# Patient Record
Sex: Male | Born: 1950
Health system: Southern US, Community
[De-identification: ages and names within clinical notes are randomized; demographics above are authoritative.]

## PROBLEM LIST (undated history)

## (undated) DIAGNOSIS — E785 Hyperlipidemia, unspecified: Secondary | ICD-10-CM

## (undated) DIAGNOSIS — M549 Dorsalgia, unspecified: Secondary | ICD-10-CM

## (undated) DIAGNOSIS — F329 Major depressive disorder, single episode, unspecified: Secondary | ICD-10-CM

## (undated) DIAGNOSIS — G43909 Migraine, unspecified, not intractable, without status migrainosus: Secondary | ICD-10-CM

## (undated) DIAGNOSIS — F32A Depression, unspecified: Secondary | ICD-10-CM

## (undated) DIAGNOSIS — E119 Type 2 diabetes mellitus without complications: Secondary | ICD-10-CM

## (undated) DIAGNOSIS — F419 Anxiety disorder, unspecified: Secondary | ICD-10-CM

## (undated) DIAGNOSIS — M1990 Unspecified osteoarthritis, unspecified site: Secondary | ICD-10-CM

## (undated) DIAGNOSIS — I10 Essential (primary) hypertension: Secondary | ICD-10-CM

## (undated) DIAGNOSIS — G8929 Other chronic pain: Secondary | ICD-10-CM

## (undated) HISTORY — DX: Essential (primary) hypertension: I10

## (undated) HISTORY — PX: BACK SURGERY: SHX140

## (undated) HISTORY — DX: Unspecified osteoarthritis, unspecified site: M19.90

## (undated) HISTORY — DX: Hyperlipidemia, unspecified: E78.5

---

## 1997-12-15 ENCOUNTER — Emergency Department (HOSPITAL_COMMUNITY): Admission: EM | Admit: 1997-12-15 | Discharge: 1997-12-16 | Payer: Self-pay | Admitting: Emergency Medicine

## 1998-07-30 ENCOUNTER — Inpatient Hospital Stay (HOSPITAL_COMMUNITY): Admission: EM | Admit: 1998-07-30 | Discharge: 1998-08-02 | Payer: Self-pay | Admitting: Emergency Medicine

## 1998-07-30 ENCOUNTER — Encounter: Payer: Self-pay | Admitting: Emergency Medicine

## 2007-06-19 DIAGNOSIS — D72829 Elevated white blood cell count, unspecified: Secondary | ICD-10-CM | POA: Insufficient documentation

## 2012-04-10 ENCOUNTER — Encounter: Payer: Self-pay | Admitting: Family Medicine

## 2012-04-10 ENCOUNTER — Ambulatory Visit (INDEPENDENT_AMBULATORY_CARE_PROVIDER_SITE_OTHER): Payer: Medicare Other | Admitting: Family Medicine

## 2012-04-10 VITALS — BP 174/86 | HR 77 | Temp 97.9°F | Resp 18 | Ht 70.0 in | Wt 224.0 lb

## 2012-04-10 DIAGNOSIS — S81802A Unspecified open wound, left lower leg, initial encounter: Secondary | ICD-10-CM

## 2012-04-10 DIAGNOSIS — R42 Dizziness and giddiness: Secondary | ICD-10-CM | POA: Diagnosis not present

## 2012-04-10 DIAGNOSIS — M79609 Pain in unspecified limb: Secondary | ICD-10-CM

## 2012-04-10 DIAGNOSIS — S91009A Unspecified open wound, unspecified ankle, initial encounter: Secondary | ICD-10-CM | POA: Diagnosis not present

## 2012-04-10 DIAGNOSIS — Z23 Encounter for immunization: Secondary | ICD-10-CM

## 2012-04-10 DIAGNOSIS — M79669 Pain in unspecified lower leg: Secondary | ICD-10-CM

## 2012-04-10 DIAGNOSIS — S81009A Unspecified open wound, unspecified knee, initial encounter: Secondary | ICD-10-CM | POA: Diagnosis not present

## 2012-04-10 DIAGNOSIS — S81809A Unspecified open wound, unspecified lower leg, initial encounter: Secondary | ICD-10-CM

## 2012-04-10 LAB — POCT CBC
Granulocyte percent: 42.9 %G (ref 37–80)
HCT, POC: 43 % — AB (ref 43.5–53.7)
Hemoglobin: 13.4 g/dL — AB (ref 14.1–18.1)
Lymph, poc: 4 — AB (ref 0.6–3.4)
MCH, POC: 27.6 pg (ref 27–31.2)
MCHC: 31.2 g/dL — AB (ref 31.8–35.4)
MCV: 88.7 fL (ref 80–97)
MID (cbc): 0.7 (ref 0–0.9)
MPV: 12.2 fL (ref 0–99.8)
POC Granulocyte: 5.3 (ref 2–6.9)
POC LYMPH PERCENT: 40 %L (ref 10–50)
POC MID %: 7.1 %M (ref 0–12)
Platelet Count, POC: 198 10*3/uL (ref 142–424)
RBC: 4.85 M/uL (ref 4.69–6.13)
RDW, POC: 14.2 %
WBC: 10.1 10*3/uL (ref 4.6–10.2)

## 2012-04-10 MED ORDER — HYDROCODONE-ACETAMINOPHEN 5-325 MG PO TABS: 1.0000 | ORAL_TABLET | Freq: Four times a day (QID) | ORAL | Status: DC | PRN

## 2012-04-10 MED ORDER — DOXYCYCLINE HYCLATE 100 MG PO TABS: 100.0000 mg | ORAL_TABLET | Freq: Two times a day (BID) | ORAL | Status: DC

## 2012-04-10 NOTE — Patient Instructions (Addendum)
Take the antibiotic twice per day.  Keep covered with bandage and dry. Keep leg elevated above your heart when seated, and use crutches to walk. If any pain or numbness into foot, or other worsening - go to the emergency room.  If needed - can take Lortab up to every 6 hours.  We will refer you to the orthopaedic surgeon on Monday.. You should be receiving a call about this.   Return to the clinic or go to the nearest emergency room if any of your symptoms worsen or new symptoms occur.

## 2012-04-10 NOTE — Progress Notes (Signed)
  Subjective:    Patient ID: Jeremy Medina, male    DOB: 1950/11/04, 61 y.o.   MRN: [id number]  HPI Jeremy Medina is a 61 y.o. male DOI - 1 hour ago. Cleaning sword collection - tip of stainless steel blade 3-4 inches in to leg.  Large puddle of blood afterwards.  Pressure to area eventually stopped bleeding.   Feels a little woozy. Normal sensation into toes, and warmth.  calf sore beyond and around wound.   Hx of htn, hyperlipidemia, back pain.  No known allergies. Last td in 2003.    Review of Systems  Skin:       L calf.  Neurological: Positive for dizziness and light-headedness.       Objective:   Physical Exam  Constitutional: He is oriented to person, place, and time. He appears well-developed and well-nourished.       Slightly lightheaded with sitting up.   HENT:  Head: Normocephalic and atraumatic.  Eyes: EOM are normal. Pupils are equal, round, and reactive to light.  Cardiovascular: Normal rate, regular rhythm, normal heart sounds and intact distal pulses.   Pulmonary/Chest: Effort normal and breath sounds normal.  Musculoskeletal:       Legs: Neurological: He is alert and oriented to person, place, and time.  Skin: He is not diaphoretic.  Psychiatric: He has a normal mood and affect. His behavior is normal.   Results for orders placed in visit on 04/10/12  POCT CBC      Component Value Range   WBC 10.1  4.6 - 10.2 K/uL   Lymph, poc 4.0 (*) 0.6 - 3.4   POC LYMPH PERCENT 40.0  10 - 50 %L   MID (cbc) 0.7  0 - 0.9   POC MID % 7.1  0 - 12 %M   POC Granulocyte 5.3  2 - 6.9   Granulocyte percent 42.9  37 - 80 %G   RBC 4.85  4.69 - 6.13 M/uL   Hemoglobin 13.4 (*) 14.1 - 18.1 g/dL   HCT, POC 40.9 (*) 81.1 - 53.7 %   MCV 88.7  80 - 97 fL   MCH, POC 27.6  27 - 31.2 pg   MCHC 31.2 (*) 31.8 - 35.4 g/dL   RDW, POC 91.4     Platelet Count, POC 198  142 - 424 K/uL   MPV 12.2  0 - 99.8 fL       Assessment & Plan:  Jeremy Medina is a 61 y.o. male 1. Leg  wound, left  Tdap vaccine greater than or equal to 7yo IM, POCT CBC    Wound L calf - with possible extension to medial calf gastrocnemius with plantarflexion pain and  Tdap given. CBC as above due to dizziness -overall reassuring.  Called orthopaedic surgeon on call to discuss. Discussed plan - recommended flushing wound - no surgery at this point, loose closure to lessen chance of infection (see procedure note), with sterile bandage to cover crutches, with nwb, elevate above heart when seated. doxycycline 100mg  BID #20,  Lortab 5/500 Q 6h prn.   Follow up with Dr.Duda in 4 days.  rtc er precautions discussed, including any signs or symptoms of compartment syndrome, or any increased dizziness.  Understanding expressed.

## 2012-04-10 NOTE — Progress Notes (Signed)
Patient ID: KINNEY SACKMANN MRN: [id number], DOB: Nov 19, 1950, 61 y.o. Date of Encounter: 04/10/2012, 3:59 PM   PROCEDURE NOTE: Verbal consent obtained. Sterile technique employed. Numbing: Anesthesia obtained with 1% lidocaine with epinephrine.   Cleansed with soap and water. Irrigated.  Wound explored, no deep structures involved, no foreign bodies.  Superficial laceration to gastrocnemius.  Wound repaired with # 5 SI using 4-0 ethilon loosely approximated.  Hemostasis obtained. Wound cleansed and dressed.  Wound care instructions including precautions covered with patient. Handout given.  Anticipate suture removal in 7-10.  Rhoderick Moody, PA-C 04/10/2012 3:59 PM

## 2012-04-18 ENCOUNTER — Ambulatory Visit (INDEPENDENT_AMBULATORY_CARE_PROVIDER_SITE_OTHER): Payer: Medicare Other | Admitting: Family Medicine

## 2012-04-18 VITALS — BP 128/78 | HR 56 | Temp 98.4°F | Resp 20 | Ht 69.0 in | Wt 220.0 lb

## 2012-04-18 DIAGNOSIS — M79669 Pain in unspecified lower leg: Secondary | ICD-10-CM

## 2012-04-18 DIAGNOSIS — M79609 Pain in unspecified limb: Secondary | ICD-10-CM

## 2012-04-18 DIAGNOSIS — S81802A Unspecified open wound, left lower leg, initial encounter: Secondary | ICD-10-CM

## 2012-04-18 MED ORDER — DOXYCYCLINE HYCLATE 100 MG PO TABS: 100.0000 mg | ORAL_TABLET | Freq: Two times a day (BID) | ORAL | Status: DC

## 2012-04-18 MED ORDER — HYDROCODONE-ACETAMINOPHEN 5-325 MG PO TABS: 1.0000 | ORAL_TABLET | Freq: Four times a day (QID) | ORAL | Status: DC | PRN

## 2012-04-18 NOTE — Progress Notes (Signed)
[facility] [address], [name] [address] [phone]  Date:  04/18/2012   Name:  Jeremy Medina   DOB:  22-Mar-1951   MRN:  [id number]  PCP:  No primary provider on file.    Chief Complaint: Suture / Staple Removal, Leg Pain and Fatigue   History of Present Illness:  Jeremy Medina is a 61 y.o. very pleasant male patient who presents with the following:  He was here on 04/10/12 after he cut his left leg in the medial calf deeply while cleaning his sword collection. He was give a Tdap booster and the wound was closed with loose stitches, given doxy and lortab.  He was to follow- up with Dr. Lajoyce Corners at [facility] ortho a few days after his visit. However, he states that he never received a call regarding this appointment.  At this point he feels that he is doing better and he does not feel that he needs to see ortho.    He still has pain at the site of the wound, but he does not have any numbness. He does not feel that he has any deficit of movement or leg strength.  He has a history of back problems and has some peripheral neuropathy at baseline- this does not seem to have changed  There is no problem list on file for this patient.   Past Medical History  Diagnosis Date  . Arthritis   . Hypertension   . Hyperlipidemia     Past Surgical History  Procedure Date  . Back surgery     History  Substance Use Topics  . Smoking status: Current Every Day Smoker -- 0.5 packs/day    Types: Cigarettes  . Smokeless tobacco: Not on file  . Alcohol Use: No    Family History  Problem Relation Age of Onset  . Diabetes Mother   . Dementia Mother   . Diabetes Father   . Hypertension Father   . Dementia Father   . Hyperlipidemia Father     No Known Allergies  Medication list has been reviewed and updated.  Current Outpatient Prescriptions on File Prior to Visit  Medication Sig Dispense Refill  . doxycycline (VIBRA-TABS) 100 MG tablet Take 1 tablet (100  mg total) by mouth 2 (two) times daily.  20 tablet  0  . HYDROcodone-acetaminophen (NORCO/VICODIN) 5-325 MG per tablet Take 1 tablet by mouth every 6 (six) hours as needed for pain.  20 tablet  0    Review of Systems:  As per HPI- otherwise negative.   Physical Examination: Filed Vitals:   04/18/12 0924  BP: 128/78  Pulse: 56  Temp: 98.4 F (36.9 C)  Resp: 20   Filed Vitals:   04/18/12 0924  Height: 5\' 9"  (1.753 m)  Weight: 220 lb (99.791 kg)   Body mass index is 32.49 kg/(m^2). Ideal Body Weight: Weight in (lb) to have BMI = 25: 168.9    GEN: WDWN, NAD, Non-toxic, Alert & Oriented x 3 HEENT: Atraumatic, Normocephalic.  Ears and Nose: No external deformity. EXTR: No clubbing/cyanosis/edema NEURO: Normal gait.  PSYCH: Normally interactive. Conversant. Not depressed or anxious appearing.  Calm demeanor.  Left leg: on the medial mid- calf there is a healing wound that is sutured. Mild redness around the wound but no purulent discharge. He has tenderness in the leg inferior to the wound- likely due to blood in tissues which is irritating.  The calves measure equally, the calf muscle is soft  without any cords.  He has good extension of the ankle, his flexion is a little weak but this is baseline per his report.    Removed sutures- wound is healing well and closed.  Placed steri- strips for support.    Assessment and Plan: 1. Leg wound, left  doxycycline (VIBRA-TABS) 100 MG tablet, HYDROcodone-acetaminophen (NORCO/VICODIN) 5-325 MG per tablet  2. Pain, lower leg  HYDROcodone-acetaminophen (NORCO/VICODIN) 5-325 MG per tablet   Will continue doxycycline treatment for another week, and gave a refill of vicodin to use as needed for pain.  Follow- up in 7- 10 days as needed.  At this point he declined to see ortho as he is doing better.  Continue to elevate the leg and take it easy.    Abbe Amsterdam, MD

## 2012-04-18 NOTE — Patient Instructions (Addendum)
Wash the wound gently with soap and water.  Continue to watch out for worsening of your pain.  If you are continuing to steadily get better that is great- if not let us know!

## 2013-05-28 ENCOUNTER — Ambulatory Visit (INDEPENDENT_AMBULATORY_CARE_PROVIDER_SITE_OTHER): Payer: Medicare Other | Admitting: Emergency Medicine

## 2013-05-28 VITALS — BP 138/82 | HR 81 | Temp 98.1°F | Resp 16 | Ht 68.25 in | Wt 211.0 lb

## 2013-05-28 DIAGNOSIS — F4321 Adjustment disorder with depressed mood: Secondary | ICD-10-CM

## 2013-05-28 DIAGNOSIS — F329 Major depressive disorder, single episode, unspecified: Secondary | ICD-10-CM | POA: Diagnosis not present

## 2013-05-28 MED ORDER — PAROXETINE HCL 20 MG PO TABS: 20.0000 mg | ORAL_TABLET | Freq: Every day | ORAL | Status: DC

## 2013-05-28 MED ORDER — LORAZEPAM 1 MG PO TABS: 1.0000 mg | ORAL_TABLET | Freq: Three times a day (TID) | ORAL | Status: DC | PRN

## 2013-05-28 NOTE — Progress Notes (Signed)
[facility] [address], [name] [address] [phone]  Date:  05/28/2013   Name:  Jeremy Medina   DOB:  [birth year]   MRN:  [id number]  PCP:  No primary provider on file.    Chief Complaint: trouble sleeping and Anxiety   History of Present Illness:  Jeremy Medina is a 62 y.o. very pleasant male patient who presents with the following:  Disabled due to back injury in [employer]	[place].  His father passed on 2023-05-18 at age 54.  Since his mother who has a rather severe dementia calls him many times a day wondering where his father is.  She has not grasped that he has passed.  As a conseqence, he is no sleeping more than a couple hours nightly and is very stressed.  He has no thoughts of harm to self others.  No hallucinations.  Poor appetite.  12 pounds weight loss over the month.  No improvement with over the counter medications or other home remedies. Denies other complaint or health concern today.   There are no active problems to display for this patient.   Past Medical History  Diagnosis Date  . Arthritis   . Hypertension   . Hyperlipidemia     Past Surgical History  Procedure Laterality Date  . Back surgery      History  Substance Use Topics  . Smoking status: Current Every Day Smoker -- 0.50 packs/day    Types: Cigarettes  . Smokeless tobacco: Not on file  . Alcohol Use: No    Family History  Problem Relation Age of Onset  . Diabetes Mother   . Dementia Mother   . Diabetes Father   . Hypertension Father   . Dementia Father   . Hyperlipidemia Father     No Known Allergies  Medication list has been reviewed and updated.  Current Outpatient Prescriptions on File Prior to Visit  Medication Sig Dispense Refill  . doxycycline (VIBRA-TABS) 100 MG tablet Take 1 tablet (100 mg total) by mouth 2 (two) times daily.  14 tablet  0  . HYDROcodone-acetaminophen (NORCO/VICODIN) 5-325 MG per tablet Take 1 tablet by mouth every 6 (six) hours as  needed for pain.  20 tablet  0   No current facility-administered medications on file prior to visit.    Review of Systems:  As per HPI, otherwise negative.    Physical Examination: Filed Vitals:   05/28/13 0925  BP: 138/82  Pulse: 81  Temp: 98.1 F (36.7 C)  Resp: 16   Filed Vitals:   05/28/13 0925  Height: 5' 8.25" (1.734 m)  Weight: 211 lb (95.709 kg)   Body mass index is 31.83 kg/(m^2). Ideal Body Weight: Weight in (lb) to have BMI = 25: 165.3  GEN: WDWN, moderate distress, Non-toxic, A & O x 3 HEENT: Atraumatic, Normocephalic. Neck supple. No masses, No LAD. Ears and Nose: No external deformity. CV: RRR, No M/G/R. No JVD. No thrill. No extra heart sounds. PULM: CTA B, no wheezes, crackles, rhonchi. No retractions. No resp. distress. No accessory muscle use. ABD: S, NT, ND, +BS. No rebound. No HSM. EXTR: No c/c/e NEURO Normal gait.  PSYCH: Normally interactive. Conversant. Not  anxious appearing.  Calm demeanor. Has a rather flat affect.   Assessment and Plan: Grieving Situational depression   Signed,  Phillips Odor, MD

## 2013-05-28 NOTE — Patient Instructions (Signed)
Depression, Adult °Depression refers to feeling sad, low, down in the dumps, blue, gloomy, or empty. In general, there are two kinds of depression: °1. Depression that we all experience from time to time because of upsetting life experiences, including the loss of a job or the ending of a relationship (normal sadness or normal grief). This kind of depression is considered normal, is short lived, and resolves within a few days to 2 weeks. (Depression experienced after the loss of a loved one is called bereavement. Bereavement often lasts longer than 2 weeks but normally gets better with time.) °2. Clinical depression, which lasts longer than normal sadness or normal grief or interferes with your ability to function at home, at work, and in school. It also interferes with your personal relationships. It affects almost every aspect of your life. Clinical depression is an illness. °Symptoms of depression also can be caused by conditions other than normal sadness and grief or clinical depression. Examples of these conditions are listed as follows: °· Physical illness Some physical illnesses, including underactive thyroid gland (hypothyroidism), severe anemia, specific types of cancer, diabetes, uncontrolled seizures, heart and lung problems, strokes, and chronic pain are commonly associated with symptoms of depression. °· Side effects of some prescription medicine In some people, certain types of prescription medicine can cause symptoms of depression. °· Substance abuse Abuse of alcohol and illicit drugs can cause symptoms of depression. °SYMPTOMS °Symptoms of normal sadness and normal grief include the following: °· Feeling sad or crying for short periods of time. °· Not caring about anything (apathy). °· Difficulty sleeping or sleeping too much. °· No longer able to enjoy the things you used to enjoy. °· Desire to be by oneself all the time (social isolation). °· Lack of energy or motivation. °· Difficulty  concentrating or remembering. °· Change in appetite or weight. °· Restlessness or agitation. °Symptoms of clinical depression include the same symptoms of normal sadness or normal grief and also the following symptoms: °· Feeling sad or crying all the time. °· Feelings of guilt or worthlessness. °· Feelings of hopelessness or helplessness. °· Thoughts of suicide or the desire to harm yourself (suicidal ideation). °· Loss of touch with reality (psychotic symptoms). Seeing or hearing things that are not real (hallucinations) or having false beliefs about your life or the people around you (delusions and paranoia). °DIAGNOSIS  °The diagnosis of clinical depression usually is based on the severity and duration of the symptoms. Your caregiver also will ask you questions about your medical history and substance use to find out if physical illness, use of prescription medicine, or substance abuse is causing your depression. Your caregiver also may order blood tests. °TREATMENT  °Typically, normal sadness and normal grief do not require treatment. However, sometimes antidepressant medicine is prescribed for bereavement to ease the depressive symptoms until they resolve. °The treatment for clinical depression depends on the severity of your symptoms but typically includes antidepressant medicine, counseling with a mental health professional, or a combination of both. Your caregiver will help to determine what treatment is best for you. °Depression caused by physical illness usually goes away with appropriate medical treatment of the illness. If prescription medicine is causing depression, talk with your caregiver about stopping the medicine, decreasing the dose, or substituting another medicine. °Depression caused by abuse of alcohol or illicit drugs abuse goes away with abstinence from these substances. Some adults need professional help in order to stop drinking or using drugs. °SEEK IMMEDIATE CARE IF: °· You have   thoughts  about hurting yourself or others. °· You lose touch with reality (have psychotic symptoms). °· You are taking medicine for depression and have a serious side effect. °FOR MORE INFORMATION °National Alliance on Mental Illness: www.nami.org  °National Institute of Mental Health: www.nimh.nih.gov  °Document Released: 06/01/2000 Document Revised: 12/04/2011 Document Reviewed: 09/03/2011 °ExitCare® Patient Information ©2014 [employer]. ° °Grief Reaction °Grief is a normal response to the death of someone close to you. Feelings of fear, anger, and guilt can affect almost everyone who loses someone they love. Symptoms of depression are also common. These include problems with sleep, loss of appetite, and lack of energy. These grief reaction symptoms often last for weeks to months after a loss. They may also return during special times that remind you of the person you lost, such as an anniversary or birthday. °Anxiety, insomnia, irritability, and deep depression may last beyond the period of normal grief. If you experience these feelings for 6 months or longer, you may have clinical depression. Clinical depression requires further medical attention. If you think that you have clinical depression, you should contact your caregiver. If you have a history of depression and or a family history of depression, you are at greater risk of clinical depression. You are also at greater risk of developing clinical depression if the loss was traumatic or the loss was of someone with whom you had unresolved issues.  °A grief reaction can become complicated by being blocked. This means being unable to cry or express extreme emotions. This may prolong the grieving period and worsen the emotional effects of the loss. Mourning is a natural event in human life. A healthy grief reaction is one that is not blocked . It requires a time of sadness and readjustment.It is very important to share your sorrow and fear with others, especially  close friends and family. Professional counselors and clergy can also help you process your grief. °Document Released: 06/04/2005 Document Revised: 08/27/2011 Document Reviewed: 02/12/2006 °ExitCare® Patient Information ©2014 [employer]. ° °

## 2013-06-25 ENCOUNTER — Other Ambulatory Visit: Payer: Self-pay | Admitting: Emergency Medicine

## 2013-06-25 ENCOUNTER — Telehealth: Payer: Self-pay

## 2013-06-25 MED ORDER — LORAZEPAM 1 MG PO TABS: 1.0000 mg | ORAL_TABLET | Freq: Three times a day (TID) | ORAL | Status: DC | PRN

## 2013-06-25 NOTE — Telephone Encounter (Signed)
[name] - Pt  Wants to know if you will refill the ativan.  He says the other medicines make him have blurry vision.  Says that the ativan keeps him calm and helps him in getting over his parents' deaths.  He thinks one more refill will be all he needs. [phone]

## 2013-06-25 NOTE — Telephone Encounter (Signed)
Pt aware.

## 2013-06-25 NOTE — Telephone Encounter (Signed)
Pt seen on 05/28/13. Do you authorize refill for pt?

## 2013-06-25 NOTE — Telephone Encounter (Signed)
sent 

## 2013-07-27 ENCOUNTER — Telehealth: Payer: Self-pay

## 2013-07-27 NOTE — Telephone Encounter (Signed)
Pt left a message on Saturday, 07/25/13, in the billing office voicemail, requesting a refill for ativan Please call pt to advise [phone]

## 2013-07-29 ENCOUNTER — Telehealth: Payer: Self-pay | Admitting: Emergency Medicine

## 2013-07-29 NOTE — Telephone Encounter (Signed)
Patient called requesting a refill on  LORazepam (ATIVAN) 1 MG tablet Please advise

## 2013-07-29 NOTE — Telephone Encounter (Signed)
Must come in to be seen for refill

## 2013-07-30 NOTE — Telephone Encounter (Signed)
Duplicate, see message under 07/27/13 message.

## 2013-07-30 NOTE — Telephone Encounter (Signed)
LMOM that Dr Dareen PianoAnderson will need to see pt back before he can RF Ativan.

## 2014-08-05 ENCOUNTER — Encounter (HOSPITAL_COMMUNITY): Payer: Self-pay

## 2014-08-05 ENCOUNTER — Emergency Department (HOSPITAL_COMMUNITY)
Admission: EM | Admit: 2014-08-05 | Discharge: 2014-08-05 | Disposition: A | Discharge status: Home / Self Care | Payer: Non-veteran care | Attending: Emergency Medicine | Admitting: Emergency Medicine

## 2014-08-05 DIAGNOSIS — Z720 Tobacco use: Secondary | ICD-10-CM | POA: Insufficient documentation

## 2014-08-05 DIAGNOSIS — G8929 Other chronic pain: Secondary | ICD-10-CM | POA: Diagnosis not present

## 2014-08-05 DIAGNOSIS — I10 Essential (primary) hypertension: Secondary | ICD-10-CM | POA: Insufficient documentation

## 2014-08-05 DIAGNOSIS — Z79899 Other long term (current) drug therapy: Secondary | ICD-10-CM | POA: Insufficient documentation

## 2014-08-05 DIAGNOSIS — L723 Sebaceous cyst: Secondary | ICD-10-CM | POA: Insufficient documentation

## 2014-08-05 DIAGNOSIS — E785 Hyperlipidemia, unspecified: Secondary | ICD-10-CM | POA: Insufficient documentation

## 2014-08-05 DIAGNOSIS — M1990 Unspecified osteoarthritis, unspecified site: Secondary | ICD-10-CM | POA: Insufficient documentation

## 2014-08-05 DIAGNOSIS — R51 Headache: Secondary | ICD-10-CM | POA: Diagnosis not present

## 2014-08-05 NOTE — ED Provider Notes (Signed)
Patient with neck pain and headaches for greater than 6 months., Unchanged. He presents today as he noticed "a lump" at left paraspinal area yesterday, however he states that lump may have been present there for a longer period of time. On exam patient is alert Glasgow Coma Score 15 no distress moves all extremities well. HCT exam no facial asymmetry neck no point tenderness. There is a 2 cm nodule to the left of midline overlying trapezius which is tender. No redness no warmth no fluctuance. Patient's pain is chronic . He requests new primary care physician, we will supply referrals  Doug SouSam Lil Lepage, MD 08/05/14 1626

## 2014-08-05 NOTE — ED Notes (Addendum)
Patient complains of throbbing neck and head pain for 6-7 months.  Patient also states "behind my eyes hurt."  Patient c/o blurred vision.  Patient states he has also had left arm numbness/weakness for the past 6-7 months.  Patient, who has a history of chronic low back pain, states he has been seen at the [facility] in [place] on many occasions for this pain.  Patient states no imaging has been done.  Patient states today he discovered a "lump," just distal to his posterior cervical spine.  Patient denies numbness/tingling in his right leg, but states he has chronic weakness, numbness and tingling in LLE.  Patient denies loss of bowel or bladder control.  On exam, lung sounds are clear bilaterally in all lobes.  Heart sounds are WNL, S1/S2 with no murmur, rate regular.  +2 radial and pedal pulses palpated.  Bowel sounds present and abdomen is soft and tender with no palpation.  CN III intact, 3mm, no astigmatism noted.  EOM intact.  Patient's speech is clear with no slurring or forced speech.  Grips R>L.  A formed mass of approximately 2 cm in diameter is palpable to left of base of neck.

## 2014-08-05 NOTE — Discharge Instructions (Signed)
The lump on your neck is most likely some type of cyst. It does not look like it can be drained right now, and likely would need a surgeon to remove it. Use warm compresses/hot showers and tylenol to help with the pain. Establish with a new primary doctor in [place] for any further evaluation/referral for this.  Tylenol: can take 500mg  every 6 hours as needed  You should have your blood pressure re-checked within the next 3 weeks by your doctor.

## 2014-08-05 NOTE — ED Notes (Signed)
Pt presents with c/o headache for a couple of months. Pt also reports he feels a lump on his neck on the left side and his having pain on that side. Pt reports he has been unable to move his neck today.

## 2014-08-05 NOTE — ED Provider Notes (Signed)
CSN: [id number]     Arrival date & time 08/05/14  1342 History   First MD Initiated Contact with Patient 08/05/14 1442     Chief Complaint  Patient presents with  . Headache  . Lump on neck   . cervical pain      (Consider location/radiation/quality/duration/timing/severity/associated sxs/prior Treatment) HPI Comments: Pt is a 64 y.o. male presenting to the ED with a lump on back of his neck and 8-12 month history of headaches, neck/arm/back pain. PMH significant for distant lumbar/sacral compression fractures while in the [employer] during [place] war, hypertension, hyperlipidemia. Pt states he has a long history of chronic headaches that occur in front left and wrap around head both sides, pain throughout his back and neck as well as left shoulder/arm pain. Says he came to the ED because he felt he needed an "answer" about the causes of his issues and because of the newly noted lump on the back of his neck that he feels is the cause of his pain and concerned it is on his spine. He has not taken anything for the pain other than his methadone that he has been on chronically for his low back injury/pain. He denies any new numbness or tingling of his LE (has some chronically), no bowel or bladder incontinence, no fevers or chills.  Patient is a 64 y.o. male presenting with headaches. The history is provided by the patient. No language interpreter was used.  Headache Associated symptoms: fatigue and numbness   Associated symptoms: no abdominal pain, no diarrhea, no dizziness, no fever, no nausea and no vomiting     Past Medical History  Diagnosis Date  . Arthritis   . Hypertension   . Hyperlipidemia    Past Surgical History  Procedure Laterality Date  . Back surgery     Family History  Problem Relation Age of Onset  . Diabetes Mother   . Dementia Mother   . Diabetes Father   . Hypertension Father   . Dementia Father   . Hyperlipidemia Father    History  Substance Use Topics  .  Smoking status: Current Every Day Smoker -- 0.50 packs/day    Types: Cigarettes  . Smokeless tobacco: Not on file  . Alcohol Use: No    Review of Systems  Constitutional: Positive for fatigue. Negative for fever and chills.  Cardiovascular: Negative for chest pain, palpitations and leg swelling.  Gastrointestinal: Negative for nausea, vomiting, abdominal pain and diarrhea.  Genitourinary: Negative for dysuria.  Neurological: Positive for numbness and headaches. Negative for dizziness and light-headedness.  Psychiatric/Behavioral: Positive for behavioral problems.  All other systems reviewed and are negative.     Allergies  Review of patient's allergies indicates no known allergies.  Home Medications   Prior to Admission medications   Medication Sig Start Date End Date Taking? Authorizing Provider  Atorvastatin Calcium (LIPITOR PO) Take 1 tablet by mouth daily.   Yes Historical Provider, MD  methadone (DOLOPHINE) 10 MG tablet Take 10 mg by mouth every 8 (eight) hours as needed for moderate pain.   Yes Historical Provider, MD  VITAMIN D, ERGOCALCIFEROL, PO Take 4 tablets by mouth daily.   Yes Historical Provider, MD  doxycycline (VIBRA-TABS) 100 MG tablet Take 1 tablet (100 mg total) by mouth 2 (two) times daily. Patient not taking: Reported on 08/05/2014 04/18/12   Pearline Cables, MD  HYDROcodone-acetaminophen (NORCO/VICODIN) 5-325 MG per tablet Take 1 tablet by mouth every 6 (six) hours as needed for pain. Patient not  taking: Reported on 08/05/2014 04/18/12   Gwenlyn FoundJessica C Copland, MD  LORazepam (ATIVAN) 1 MG tablet Take 1 tablet (1 mg total) by mouth every 8 (eight) hours as needed for anxiety. Patient not taking: Reported on 08/05/2014 06/25/13   Carmelina DaneJeffery S [name], MD  PARoxetine (PAXIL) 20 MG tablet Take 1 tablet (20 mg total) by mouth daily. Patient not taking: Reported on 08/05/2014 05/28/13   Carmelina DaneJeffery S [name], MD   BP 152/92 mmHg  Pulse 63  Temp(Src) 98.2 F (36.8 C) (Oral)   Resp 20  SpO2 97% Physical Exam  Constitutional: He is oriented to person, place, and time. He appears well-developed and well-nourished.  HENT:  Head: Normocephalic and atraumatic.  Neck: Muscular tenderness present. No rigidity.    Cardiovascular: Normal rate, regular rhythm, normal heart sounds and intact distal pulses.   Musculoskeletal:       Right shoulder: He exhibits decreased range of motion (abduction to 90* passively but pt still complains of pain).  Neurological: He is alert and oriented to person, place, and time. No cranial nerve deficit.  Skin: Skin is warm and dry.  Psychiatric: He is withdrawn. Cognition and memory are not impaired. He exhibits a depressed mood. He expresses no homicidal and no suicidal ideation. He expresses no suicidal plans and no homicidal plans.  Nursing note and vitals reviewed.  Bedside ultrasound exam performed by Dr. Ethelda ChickJacubowitz: no definite cystic fluid in the mass that could be drained.  ED Course  Procedures (including critical care time) Labs Review Labs Reviewed - No data to display  Imaging Review No results found.   EKG Interpretation None      MDM   Final diagnoses:  Sebaceous cyst  Chronic pain   Initial BP elevated, repeat better controlled. Discussed extensively with patient that the mass he is feeling is most likely benign/cyst in nature but would probably need surgical referral for removal. Encouraged to establish with PCP in [place] has [facility]. His pain may be worsened by his chronic methadone use causing hyperthesias. Encouraged to use warm compresses, tylenol to help with pain. Stable for discharge.     Nani RavensAndrew M Lashawna Poche, MD 08/05/14 1617  Nani RavensAndrew M Lexie Morini, MD 08/05/14 1624  Doug SouSam Jacubowitz, MD 08/05/14 [id number]

## 2014-11-24 ENCOUNTER — Ambulatory Visit: Payer: Medicare Other

## 2014-11-24 ENCOUNTER — Ambulatory Visit (INDEPENDENT_AMBULATORY_CARE_PROVIDER_SITE_OTHER): Payer: Medicare Other

## 2014-11-24 ENCOUNTER — Ambulatory Visit (INDEPENDENT_AMBULATORY_CARE_PROVIDER_SITE_OTHER): Payer: Medicare Other | Admitting: Physician Assistant

## 2014-11-24 VITALS — BP 116/72 | HR 75 | Temp 98.6°F | Resp 16 | Ht 71.0 in

## 2014-11-24 DIAGNOSIS — Z23 Encounter for immunization: Secondary | ICD-10-CM | POA: Diagnosis not present

## 2014-11-24 DIAGNOSIS — F329 Major depressive disorder, single episode, unspecified: Secondary | ICD-10-CM

## 2014-11-24 DIAGNOSIS — M545 Low back pain, unspecified: Secondary | ICD-10-CM

## 2014-11-24 DIAGNOSIS — G894 Chronic pain syndrome: Secondary | ICD-10-CM

## 2014-11-24 DIAGNOSIS — S71112A Laceration without foreign body, left thigh, initial encounter: Secondary | ICD-10-CM | POA: Diagnosis not present

## 2014-11-24 DIAGNOSIS — F32A Depression, unspecified: Secondary | ICD-10-CM

## 2014-11-24 MED ORDER — CEPHALEXIN 500 MG PO CAPS: 500.0000 mg | ORAL_CAPSULE | Freq: Two times a day (BID) | ORAL | Status: DC

## 2014-11-24 NOTE — Patient Instructions (Signed)
[email or web address] Go to above website to read up on chronic pain support groups in the area. Return in 48 hours for follow up. Leave packing in place until then. Take antibiotic twice a day for 10 days. Change dressing daily. [facility] and [facility] will [facility] you a [facility] clinic.

## 2014-11-24 NOTE — Progress Notes (Signed)
[facility] [address], [name] [address] [phone]  Date:  11/24/2014   Name:  Jeremy Medina   DOB:  [birth year]   MRN:  [id number]  PCP:  No primary care provider on file.    Chief Complaint: Laceration; Back Pain; Headache; and Depression   History of Present Illness:  This is a 64 y.o. male who is presenting with laceration to left thigh and back pain after a fall last night. Occurred at least 15 hours ago. States he was arranging things in his bedroom and tripped over a model airplane that was on the floor. He fell and cut his left thigh on the motor of a model airplane. He put an unknown first aid ointment inside the wound. Patient has 40 year history of DDD and has had unknown lumbar surgery 40 years ago. He injured his back in the [employer]. He is cared for by Dr. Dola Factor at the [facility] in [place], [name]. He receives methadone monthly in the mail from the [place]. he also takes Ativan daily for neck pain and headaches. Patient is stating that back pain has been aggravated since his fall yesterday. He is having pain on the right side that is shooting down his leg. Patient reports he has chronic weakness on his left side. He states he walks hunched at baseline. Patient is wanting more Ativan for his headaches.   Patient does admit to depression but states he does not want to be on anything. He has been on SSRIs and SSRIs in the past and he has had side effects. He states he does not have a support system here except for his son who he lives with. He does not have any SI/HI.   Review of Systems:  Review of Systems  Constitutional: Negative for fever and chills.  Respiratory: Negative for shortness of breath.   Cardiovascular: Negative for chest pain.  Gastrointestinal: Negative for nausea and vomiting.  Genitourinary: Negative for dysuria.  Musculoskeletal: Positive for back pain and neck pain.  Skin: Positive for color change and wound.  Neurological: Positive for  weakness and headaches. Negative for numbness.  Psychiatric/Behavioral: Positive for dysphoric mood. Negative for suicidal ideas and self-injury.    There are no active problems to display for this patient.   Prior to Admission medications   Medication Sig Start Date End Date Taking? Authorizing Provider  LORazepam (ATIVAN) 1 MG tablet Take 1 tablet (1 mg total) by mouth every 8 (eight) hours as needed for anxiety. 06/25/13  Yes Carmelina Dane, MD  methadone (DOLOPHINE) 10 MG tablet Take 10 mg by mouth every 8 (eight) hours as needed for moderate pain.   Yes Historical Provider, MD    No Known Allergies  Past Surgical History  Procedure Laterality Date  . Back surgery      History  Substance Use Topics  . Smoking status: Current Every Day Smoker -- 0.50 packs/day    Types: Cigarettes  . Smokeless tobacco: Not on file  . Alcohol Use: No    Family History  Problem Relation Age of Onset  . Diabetes Mother   . Dementia Mother   . Diabetes Father   . Hypertension Father   . Dementia Father   . Hyperlipidemia Father     Medication list has been reviewed and updated.  Physical Examination:  Physical Exam  Constitutional: He is oriented to person, place, and time. He appears well-developed and well-nourished. No distress.  HENT:  Head: Normocephalic  and atraumatic.  Right Ear: Hearing normal.  Left Ear: Hearing normal.  Nose: Nose normal.  Mouth/Throat: Uvula is midline, oropharynx is clear and moist and mucous membranes are normal.  Eyes: Conjunctivae and lids are normal. Right eye exhibits no discharge. Left eye exhibits no discharge. No scleral icterus.  Cardiovascular: Normal rate, regular rhythm, normal heart sounds and normal pulses.   No murmur heard. Pulmonary/Chest: Effort normal and breath sounds normal. No respiratory distress. He has no wheezes. He has no rhonchi. He has no rales.  Abdominal: Soft. Normal appearance. There is no tenderness.   Musculoskeletal: Normal range of motion.       Lumbar back: He exhibits tenderness (right paraspinal and lateral right hip). He exhibits no bony tenderness, no swelling and no laceration.  Pt hunches when walking Gait antalgic. Full strength 5/5 in right leg. Strength 4/5 in left leg (chronic) Sensation intact. Pulses intact. Cap refill intact.  Neurological: He is alert and oriented to person, place, and time. Gait abnormal.  Skin: Skin is warm and dry.  4 cm laceration on left thigh. Laceration is deep to level of muscle, muscle not lacerated. Laceration with white debris inside. No evidence of infection. Wound edges scabbed and starting to heal.  Psychiatric: He has a normal mood and affect. His speech is normal and behavior is normal. Thought content normal.   BP 116/72 mmHg  Pulse 75  Temp(Src) 98.6 F (37 C) (Oral)  Resp 16  Ht 5\' 11"  (1.803 m)  SpO2 98%   [facility] reading (PRIMARY) by  Dr. Cleta Albertsaub:  Lumbar: Multilevel degenerative disc disease. Calcification in plain film, assumed to be in right kidney. Hip: negative  Wound irrigated profusely with soap and water. White ointment removed from wound as much as possible. Wound packed with xeroform gauze. Dressing placed.  Assessment and Plan:  1. Right-sided low back pain without sciatica 2. Depression 3. Chronic pain syndrome Radiographs with no acute abnormalities. Pt has a hunched gait that is normal for him and weakness in his left leg that is normal for him. [place] [facility]. He is asking for ativan. I explained to pt it would be unwise for me to get involved in his care. [facility]. Pt may benefit from seeing a pain clinic. [place] [facility] to [facility] [place] there. I have given him information on chronic pain support groups in the area as pt has dysphoric mood and feels he has no support group. No SI/HI. He does not want to be on medication for his mood. - DG  Lumbar Spine Complete; Future - DG HIP UNILAT WITH PELVIS 2-3 VIEWS RIGHT; Future  4. Need for Tdap vaccination - Tdap vaccine greater than or equal to 7yo IM  5. Laceration of thigh, left, initial encounter Wound too old for primary repair. Irrigated wound and packed with xeroform gauze. Keflex prescribed. Return in 48 hours for wound care. If not healing well, may need to be repacked with xeroform with continued follow up. If healing well, may be reasonable to take xeroform out and let heal. - cephALEXin (KEFLEX) 500 MG capsule; Take 1 capsule (500 mg total) by mouth 2 (two) times daily.  Dispense: 20 capsule; Refill: 0    Roswell MinersNicole V. Dyke BrackettBush, PA-C, MHS [facility]  11/25/2014

## 2014-11-24 NOTE — Progress Notes (Signed)
tdap not given today due to him having one on 04/10/12.

## 2014-11-30 ENCOUNTER — Ambulatory Visit (INDEPENDENT_AMBULATORY_CARE_PROVIDER_SITE_OTHER): Payer: Medicare Other | Admitting: Physician Assistant

## 2014-11-30 ENCOUNTER — Telehealth: Payer: Self-pay

## 2014-11-30 VITALS — BP 126/70 | HR 90 | Temp 97.8°F | Resp 18 | Ht 69.5 in | Wt 216.0 lb

## 2014-11-30 DIAGNOSIS — G894 Chronic pain syndrome: Secondary | ICD-10-CM | POA: Diagnosis not present

## 2014-11-30 DIAGNOSIS — E118 Type 2 diabetes mellitus with unspecified complications: Secondary | ICD-10-CM | POA: Diagnosis not present

## 2014-11-30 DIAGNOSIS — S71112A Laceration without foreign body, left thigh, initial encounter: Secondary | ICD-10-CM | POA: Diagnosis not present

## 2014-11-30 DIAGNOSIS — S71112D Laceration without foreign body, left thigh, subsequent encounter: Secondary | ICD-10-CM | POA: Diagnosis not present

## 2014-11-30 LAB — POCT CBC
Granulocyte percent: 67.7 %G (ref 37–80)
HEMATOCRIT: 45.1 % (ref 43.5–53.7)
HEMOGLOBIN: 15 g/dL (ref 14.1–18.1)
Lymph, poc: 2.8 (ref 0.6–3.4)
MCH, POC: 27.7 pg (ref 27–31.2)
MCHC: 33.3 g/dL (ref 31.8–35.4)
MCV: 83 fL (ref 80–97)
MID (cbc): 0.8 (ref 0–0.9)
MPV: 8.5 fL (ref 0–99.8)
POC GRANULOCYTE: 7.4 — AB (ref 2–6.9)
POC LYMPH PERCENT: 25.4 %L (ref 10–50)
POC MID %: 6.9 %M (ref 0–12)
Platelet Count, POC: 308 10*3/uL (ref 142–424)
RBC: 5.43 M/uL (ref 4.69–6.13)
RDW, POC: 13.8 %
WBC: 10.9 10*3/uL — AB (ref 4.6–10.2)

## 2014-11-30 LAB — POCT SEDIMENTATION RATE: POCT SED RATE: 37 mm/h — AB (ref 0–22)

## 2014-11-30 MED ORDER — MUPIROCIN 2 % EX OINT: 1.0000 "application " | TOPICAL_OINTMENT | Freq: Three times a day (TID) | CUTANEOUS | Status: DC

## 2014-11-30 MED ORDER — CANAGLIFLOZIN 100 MG PO TABS: 100.0000 mg | ORAL_TABLET | Freq: Every day | ORAL | Status: DC

## 2014-11-30 MED ORDER — CEPHALEXIN 500 MG PO CAPS: 500.0000 mg | ORAL_CAPSULE | Freq: Two times a day (BID) | ORAL | Status: AC

## 2014-11-30 NOTE — Progress Notes (Signed)
[facility] [address], [name] [address] [phone]  Date:  11/30/2014   Name:  Jeremy Medina   DOB:  09-26-1950   MRN:  [id number]  PCP:  No PCP Per Patient    Chief Complaint: Follow-up   History of Present Illness:  This is a 64 y.o. male with chronic pain syndrome and insulin dependent DM who is presenting for follow up of a left thigh laceration. He was seen here on 11/24/14. Laceration >15 hours old at time of OV - d/t his DM, risk of infection too high, consulted with [name] and decided to let heal by secondary intent. Wound was packed with xeroform and he was placed on keflex and instructed to return in 48 hours for follow up. Pt did not return until today. He took xeroform out after 48 hours. States the leg is painful esp to touch. He has been cleaning daily and redressing. He has been taking keflex BID but states he only has 2 pills left. He feels pretty sure about this even though records show I prescribed 20 tablets and he should still have #10 left.  Pt gets his medical care at the [facility] by Dr. Hardie Lora. He gets ativan, methadone, novolog and lantus from them. States he takes lantus 10 mg BID and novolog 10 mg q meals. States his diabetes was dx'd 1 year ago and "it is out of control" and thinks "may be because of agent orange" in [place] war. States his sugars run from 280-450 and can't figure out a rhyme or reason to why they are high or low. He avoids white foods. He only drinks water. He has a gatorade 2-3 times a week. Pt thinks he was prescribed metformin before but it made him sick. He is unsure if he has ever been on anything else other than insulin. He endorsing pain/tingling in his bilateral feet with feeling of "rock in my foot". Has never had sores on his feet that wouldn't heal. He also endorses chronic headaches and ringing in his ears. He admits to polyuria and nocturia for "a long time". He feels he is not getting good care at the [place]. He  feels his DM is not controlled and neither is his pain.   Review of Systems:  Review of Systems  Constitutional: Negative for fever and chills.  HENT: Positive for tinnitus.   Gastrointestinal: Negative for nausea, vomiting and abdominal pain.  Endocrine: Positive for polyuria.  Genitourinary: Negative for dysuria.  Skin: Positive for wound.  Neurological: Positive for numbness and headaches.  Hematological: Negative for adenopathy.    There are no active problems to display for this patient.   Prior to Admission medications   Medication Sig Start Date End Date Taking? Authorizing Provider  cephALEXin (KEFLEX) 500 MG capsule Take 1 capsule (500 mg total) by mouth 2 (two) times daily. 11/24/14 12/04/14 Yes Dejean Tribby V, PA-C  insulin aspart (NOVOLOG) 100 UNIT/ML injection Inject 10 Units into the skin once.   Yes Historical Provider, MD  insulin glargine (LANTUS) 100 UNIT/ML injection Inject 30 Units into the skin daily.   Yes Historical Provider, MD  methadone (DOLOPHINE) 10 MG tablet Take 10 mg by mouth every 8 (eight) hours as needed for moderate pain.   Yes Historical Provider, MD  LORazepam (ATIVAN) 1 MG tablet Take 1 tablet (1 mg total) by mouth every 8 (eight) hours as needed for anxiety. Patient not taking: Reported on 11/30/2014 06/25/13   Leotis Shames  Ewell Poe, MD    No Known Allergies  Past Surgical History  Procedure Laterality Date  . Back surgery      History  Substance Use Topics  . Smoking status: Current Every Day Smoker -- 0.50 packs/day    Types: Cigarettes  . Smokeless tobacco: Not on file  . Alcohol Use: No    Family History  Problem Relation Age of Onset  . Diabetes Mother   . Dementia Mother   . Diabetes Father   . Hypertension Father   . Dementia Father   . Hyperlipidemia Father     Medication list has been reviewed and updated.  Physical Examination:  Physical Exam  Constitutional: He is oriented to person, place, and time. He appears  well-developed and well-nourished. No distress.  HENT:  Head: Normocephalic and atraumatic.  Right Ear: Hearing normal.  Left Ear: Hearing normal.  Nose: Nose normal.  Eyes: Conjunctivae and lids are normal. Right eye exhibits no discharge. Left eye exhibits no discharge. No scleral icterus.  Cardiovascular: Normal rate and regular rhythm.   Pulmonary/Chest: Effort normal and breath sounds normal. No respiratory distress.  Musculoskeletal: Normal range of motion.  Neurological: He is alert and oriented to person, place, and time.  Skin: Skin is warm and dry.  4 cm laceration to left thigh. Healing from the sides with good granulation tissue. Center of wound still very deep to level of fascia. No purulent discharge. Erythema just around wound edges. TTP. Wound debrided and redressed.  No wounds or edema to bilateral LEs.  Psychiatric: He has a normal mood and affect. His speech is normal and behavior is normal. Thought content normal.   BP 126/70 mmHg  Pulse 90  Temp(Src) 97.8 F (36.6 C) (Oral)  Resp 18  Ht 5' 9.5" (1.765 m)  Wt 216 lb (97.977 kg)  BMI 31.45 kg/m2  SpO2 98%  Results for orders placed or performed in visit on 11/30/14  POCT CBC  Result Value Ref Range   WBC 10.9 (A) 4.6 - 10.2 K/uL   Lymph, poc 2.8 0.6 - 3.4   POC LYMPH PERCENT 25.4 10 - 50 %L   MID (cbc) 0.8 0 - 0.9   POC MID % 6.9 0 - 12 %M   POC Granulocyte 7.4 (A) 2 - 6.9   Granulocyte percent 67.7 37 - 80 %G   RBC 5.43 4.69 - 6.13 M/uL   Hemoglobin 15.0 14.1 - 18.1 g/dL   HCT, POC 16.1 09.6 - 53.7 %   MCV 83.0 80 - 97 fL   MCH, POC 27.7 27 - 31.2 pg   MCHC 33.3 31.8 - 35.4 g/dL   RDW, POC 04.5 %   Platelet Count, POC 308 142 - 424 K/uL   MPV 8.5 0 - 99.8 fL   Assessment and Plan:  1. Laceration of thigh, left, initial encounter 2. Type 2 DM with complication CBC with mildly elevated wbc to 10.9. Pt feels certain he is almost out of keflex even though he should have #10 tabs left. Prescribed #10  more tabs to finish 10 days of abx treatment. Sed rate pending. bactroban prescribed to apply to laceration TID. Wound healing on either side but still very deep in the middle to level of fascia. Extremely important that DM controlled so this wound heals. Per pt report of at home glucose monitoring and symptoms suggestive of complication from DM - DM uncontrolled. Discussed case with Dr. Milus Glazier who suggested some med changes. Pt will stop Novolog.  He will take lantus 30 units QHS. Start invokana. Continue monitoring glucose at home. A1C pending. Referred to Dr Elvera Lennox with Endocrinology for more close follow up. Return in 5 days for wound care. - POCT CBC - POCT SEDIMENTATION RATE - Hemoglobin A1c - mupirocin ointment (BACTROBAN) 2 %; Apply 1 application topically 3 (three) times daily.  Dispense: 22 g; Refill: 1 - cephALEXin (KEFLEX) 500 MG capsule; Take 1 capsule (500 mg total) by mouth 2 (two) times daily.  Dispense: 10 capsule; Refill: 0 - Comprehensive metabolic panel - Hemoglobin A1c - Ambulatory referral to Endocrinology - canagliflozin (INVOKANA) 100 MG TABS tablet; Take 1 tablet (100 mg total) by mouth daily.  Dispense: 30 tablet; Refill: 2   Roswell Miners. Dyke Brackett, MHS [facility]  11/30/2014

## 2014-11-30 NOTE — Telephone Encounter (Signed)
5. Laceration of thigh, left, initial encounter Wound too old for primary repair. Irrigated wound and packed with xeroform gauze. Keflex prescribed. Return in 48 hours for wound care. If not healing well, may need to be repacked with xeroform with continued follow up. If healing well, may be reasonable to take xeroform out and let heal. - cephALEXin (KEFLEX) 500 MG capsule; Take 1 capsule (500 mg total) by mouth 2 (two) times daily. Dispense: 20 capsule; Refill: 0    Left detailed message letting pt know he was supposed to follow up in 48 hours. Advised to RTC.

## 2014-11-30 NOTE — Telephone Encounter (Signed)
Pt would like to speak with Joni Reining to see what she says about the gash in his leg. Please call [phone]

## 2014-11-30 NOTE — Patient Instructions (Addendum)
Apply ointment to laceration 3 times a day. Keep covered. Continue keflex antibiotic. Return in 5 days for recheck. I will call you with results of your lab tests. Stop novolog. Take 20 units lantus. You will get a phone call to make appointment with Dr Elvera Lennox, a diabetic specialist.

## 2014-12-01 LAB — COMPREHENSIVE METABOLIC PANEL
ALBUMIN: 4.2 g/dL (ref 3.5–5.2)
ALT: 26 U/L (ref 0–53)
AST: 29 U/L (ref 0–37)
Alkaline Phosphatase: 79 U/L (ref 39–117)
BUN: 19 mg/dL (ref 6–23)
CALCIUM: 9.3 mg/dL (ref 8.4–10.5)
CO2: 23 meq/L (ref 19–32)
CREATININE: 1.13 mg/dL (ref 0.50–1.35)
Chloride: 98 mEq/L (ref 96–112)
Glucose, Bld: 244 mg/dL — ABNORMAL HIGH (ref 70–99)
Potassium: 4.3 mEq/L (ref 3.5–5.3)
SODIUM: 133 meq/L — AB (ref 135–145)
Total Bilirubin: 0.6 mg/dL (ref 0.2–1.2)
Total Protein: 7.3 g/dL (ref 6.0–8.3)

## 2014-12-01 LAB — HEMOGLOBIN A1C
Hgb A1c MFr Bld: 9 % — ABNORMAL HIGH (ref <5.7)
Mean Plasma Glucose: 212 mg/dL — ABNORMAL HIGH (ref <117)

## 2014-12-15 ENCOUNTER — Telehealth: Payer: Self-pay | Admitting: Physician Assistant

## 2014-12-15 NOTE — Telephone Encounter (Signed)
Called pt, went straight to voicemail. Left message to call back.

## 2014-12-16 ENCOUNTER — Encounter: Payer: Self-pay | Admitting: General Practice

## 2015-03-16 ENCOUNTER — Encounter (HOSPITAL_COMMUNITY): Payer: Self-pay | Admitting: *Deleted

## 2015-03-16 ENCOUNTER — Emergency Department (HOSPITAL_COMMUNITY): Payer: Non-veteran care

## 2015-03-16 ENCOUNTER — Emergency Department (HOSPITAL_COMMUNITY)
Admission: EM | Admit: 2015-03-16 | Discharge: 2015-03-16 | Disposition: A | Discharge status: Home / Self Care | Payer: Non-veteran care | Attending: Emergency Medicine | Admitting: Emergency Medicine

## 2015-03-16 DIAGNOSIS — R112 Nausea with vomiting, unspecified: Secondary | ICD-10-CM | POA: Insufficient documentation

## 2015-03-16 DIAGNOSIS — R51 Headache: Secondary | ICD-10-CM | POA: Insufficient documentation

## 2015-03-16 DIAGNOSIS — Z8739 Personal history of other diseases of the musculoskeletal system and connective tissue: Secondary | ICD-10-CM | POA: Insufficient documentation

## 2015-03-16 DIAGNOSIS — I10 Essential (primary) hypertension: Secondary | ICD-10-CM | POA: Diagnosis not present

## 2015-03-16 DIAGNOSIS — R200 Anesthesia of skin: Secondary | ICD-10-CM | POA: Diagnosis not present

## 2015-03-16 DIAGNOSIS — R079 Chest pain, unspecified: Secondary | ICD-10-CM | POA: Diagnosis not present

## 2015-03-16 DIAGNOSIS — Z720 Tobacco use: Secondary | ICD-10-CM | POA: Diagnosis not present

## 2015-03-16 DIAGNOSIS — R42 Dizziness and giddiness: Secondary | ICD-10-CM | POA: Insufficient documentation

## 2015-03-16 DIAGNOSIS — E785 Hyperlipidemia, unspecified: Secondary | ICD-10-CM | POA: Insufficient documentation

## 2015-03-16 DIAGNOSIS — Z79899 Other long term (current) drug therapy: Secondary | ICD-10-CM | POA: Diagnosis not present

## 2015-03-16 DIAGNOSIS — Z794 Long term (current) use of insulin: Secondary | ICD-10-CM | POA: Insufficient documentation

## 2015-03-16 LAB — CBC
HCT: 42.3 % (ref 39.0–52.0)
Hemoglobin: 13.9 g/dL (ref 13.0–17.0)
MCH: 28.4 pg (ref 26.0–34.0)
MCHC: 32.9 g/dL (ref 30.0–36.0)
MCV: 86.3 fL (ref 78.0–100.0)
PLATELETS: 211 10*3/uL (ref 150–400)
RBC: 4.9 MIL/uL (ref 4.22–5.81)
RDW: 14 % (ref 11.5–15.5)
WBC: 9.8 10*3/uL (ref 4.0–10.5)

## 2015-03-16 LAB — I-STAT TROPONIN, ED: Troponin i, poc: 0 ng/mL (ref 0.00–0.08)

## 2015-03-16 LAB — ETHANOL

## 2015-03-16 LAB — COMPREHENSIVE METABOLIC PANEL
ALBUMIN: 3.8 g/dL (ref 3.5–5.0)
ALT: 16 U/L — ABNORMAL LOW (ref 17–63)
ANION GAP: 11 (ref 5–15)
AST: 33 U/L (ref 15–41)
Alkaline Phosphatase: 73 U/L (ref 38–126)
BUN: 16 mg/dL (ref 6–20)
CHLORIDE: 101 mmol/L (ref 101–111)
CO2: 25 mmol/L (ref 22–32)
Calcium: 9 mg/dL (ref 8.9–10.3)
Creatinine, Ser: 1.19 mg/dL (ref 0.61–1.24)
GFR calc non Af Amer: >60 mL/min (ref >60)
GLUCOSE: 296 mg/dL — AB (ref 65–99)
POTASSIUM: 4.4 mmol/L (ref 3.5–5.1)
SODIUM: 137 mmol/L (ref 135–145)
Total Bilirubin: 0.8 mg/dL (ref 0.3–1.2)
Total Protein: 7.3 g/dL (ref 6.5–8.1)

## 2015-03-16 LAB — DIFFERENTIAL
BASOS PCT: 0 %
Basophils Absolute: 0 10*3/uL (ref 0.0–0.1)
EOS ABS: 0.1 10*3/uL (ref 0.0–0.7)
EOS PCT: 1 %
LYMPHS PCT: 30 %
Lymphs Abs: 2.9 10*3/uL (ref 0.7–4.0)
Monocytes Absolute: 0.7 10*3/uL (ref 0.1–1.0)
Monocytes Relative: 7 %
NEUTROS PCT: 62 %
Neutro Abs: 6 10*3/uL (ref 1.7–7.7)

## 2015-03-16 LAB — URINALYSIS, ROUTINE W REFLEX MICROSCOPIC
BILIRUBIN URINE: NEGATIVE
Glucose, UA: >1000 mg/dL — AB
HGB URINE DIPSTICK: NEGATIVE
Ketones, ur: NEGATIVE mg/dL
Leukocytes, UA: NEGATIVE
Nitrite: NEGATIVE
PH: 5 (ref 5.0–8.0)
Protein, ur: NEGATIVE mg/dL
SPECIFIC GRAVITY, URINE: 1.037 — AB (ref 1.005–1.030)
Urobilinogen, UA: 0.2 mg/dL (ref 0.0–1.0)

## 2015-03-16 LAB — RAPID URINE DRUG SCREEN, HOSP PERFORMED
AMPHETAMINES: NOT DETECTED
Barbiturates: NOT DETECTED
Benzodiazepines: POSITIVE — AB
COCAINE: NOT DETECTED
OPIATES: NOT DETECTED
TETRAHYDROCANNABINOL: NOT DETECTED

## 2015-03-16 LAB — PROTIME-INR
INR: 1.05 (ref 0.00–1.49)
PROTHROMBIN TIME: 13.9 s (ref 11.6–15.2)

## 2015-03-16 LAB — I-STAT CHEM 8, ED
BUN: 19 mg/dL (ref 6–20)
Calcium, Ion: 1.14 mmol/L (ref 1.13–1.30)
Chloride: 101 mmol/L (ref 101–111)
Creatinine, Ser: 1.1 mg/dL (ref 0.61–1.24)
Glucose, Bld: 300 mg/dL — ABNORMAL HIGH (ref 65–99)
HEMATOCRIT: 44 % (ref 39.0–52.0)
Hemoglobin: 15 g/dL (ref 13.0–17.0)
Potassium: 4.2 mmol/L (ref 3.5–5.1)
SODIUM: 138 mmol/L (ref 135–145)
TCO2: 27 mmol/L (ref 0–100)

## 2015-03-16 LAB — APTT: aPTT: 30 seconds (ref 24–37)

## 2015-03-16 LAB — URINE MICROSCOPIC-ADD ON

## 2015-03-16 MED ORDER — MECLIZINE HCL 25 MG PO TABS
25.0000 mg | ORAL_TABLET | Freq: Once | ORAL | Status: AC
  Administered 2015-03-16: 25 mg via ORAL
  Filled 2015-03-16: qty 1

## 2015-03-16 MED ORDER — DIAZEPAM 5 MG/ML IJ SOLN
5.0000 mg | Freq: Once | INTRAMUSCULAR | Status: AC
  Administered 2015-03-16: 5 mg via INTRAVENOUS
  Filled 2015-03-16: qty 2

## 2015-03-16 MED ORDER — MECLIZINE HCL 50 MG PO TABS: 50.0000 mg | ORAL_TABLET | Freq: Three times a day (TID) | ORAL | Status: DC | PRN

## 2015-03-16 MED ORDER — METOCLOPRAMIDE HCL 5 MG/ML IJ SOLN
10.0000 mg | Freq: Once | INTRAMUSCULAR | Status: AC
  Administered 2015-03-16: 10 mg via INTRAVENOUS
  Filled 2015-03-16: qty 2

## 2015-03-16 MED ORDER — DIPHENHYDRAMINE HCL 50 MG/ML IJ SOLN
25.0000 mg | Freq: Once | INTRAMUSCULAR | Status: AC
  Administered 2015-03-16: 25 mg via INTRAVENOUS
  Filled 2015-03-16: qty 1

## 2015-03-16 MED ORDER — ONDANSETRON HCL 4 MG/2ML IJ SOLN
4.0000 mg | Freq: Once | INTRAMUSCULAR | Status: AC
  Administered 2015-03-16: 4 mg via INTRAVENOUS
  Filled 2015-03-16: qty 2

## 2015-03-16 NOTE — ED Provider Notes (Signed)
CSN: [id number]     Arrival date & time 03/16/15  1124 History   First MD Initiated Contact with Patient 03/16/15 1138     Chief Complaint  Patient presents with  . Dizziness  . Emesis  . Numbness  . Headache     (Consider location/radiation/quality/duration/timing/severity/associated sxs/prior Treatment) HPI  64 year old male presents with dizziness/room spinning sensation over the past 24 hours. Started when he first woke up yesterday. Patient states initially it was only when he sat up that he would get room spinning sensation but now it is continuous. Patient has been having a right-sided pulsating headache during that time as well. He's been having pulsating sensation in his head intermittently for the past 1 year, usually bilateral. Now seems to be localized to the right. Feels like water is pumping in his brain. He also feels like his right face is numb and maybe his right side of his body is weak. Denies shortness of breath. Has felt a lump on right upper chest with pain x 3 days.   Past Medical History  Diagnosis Date  . Arthritis   . Hypertension   . Hyperlipidemia    Past Surgical History  Procedure Laterality Date  . Back surgery     Family History  Problem Relation Age of Onset  . Diabetes Mother   . Dementia Mother   . Diabetes Father   . Hypertension Father   . Dementia Father   . Hyperlipidemia Father    Social History  Substance Use Topics  . Smoking status: Current Every Day Smoker -- 0.50 packs/day    Types: Cigarettes  . Smokeless tobacco: None  . Alcohol Use: No    Review of Systems  Constitutional: Negative for fever.  Respiratory: Negative for shortness of breath.   Cardiovascular: Positive for chest pain.  Gastrointestinal: Positive for nausea and vomiting.  Musculoskeletal: Negative for neck pain.  Neurological: Positive for dizziness, numbness and headaches. Negative for weakness.  All other systems reviewed and are  negative.     Allergies  Bee venom  Home Medications   Prior to Admission medications   Medication Sig Start Date End Date Taking? Authorizing Abraham Entwistle  atorvastatin (LIPITOR) 80 MG tablet Take 80 mg by mouth daily.   Yes Historical Sadee Osland, MD  insulin glargine (LANTUS) 100 UNIT/ML injection Inject 30 Units into the skin daily.   Yes Historical Aryam Zhan, MD  methadone (DOLOPHINE) 10 MG tablet Take 5-10 mg by mouth 3 (three) times daily. Take 10 mg by mouth every morning, 5 mg every afternoon, and 10 mg every night   Yes Historical Jailynn Lavalais, MD  canagliflozin (INVOKANA) 100 MG TABS tablet Take 1 tablet (100 mg total) by mouth daily. Patient not taking: Reported on 03/16/2015 11/30/14   Dorna Leitz, PA-C  LORazepam (ATIVAN) 1 MG tablet Take 1 tablet (1 mg total) by mouth every 8 (eight) hours as needed for anxiety. Patient not taking: Reported on 11/30/2014 06/25/13   Carmelina Dane, MD  mupirocin ointment (BACTROBAN) 2 % Apply 1 application topically 3 (three) times daily. Patient not taking: Reported on 03/16/2015 11/30/14   Lanier Clam V, PA-C   BP 170/87 mmHg  Pulse 80  Temp(Src) 98.1 F (36.7 C) (Oral)  Resp 14  SpO2 95% Physical Exam  Constitutional: He is oriented to person, place, and time. He appears well-developed and well-nourished.  HENT:  Head: Normocephalic and atraumatic.  Right Ear: External ear normal.  Left Ear: External ear normal.  Nose: Nose normal.  Eyes: EOM are normal. Pupils are equal, round, and reactive to light. Right eye exhibits no discharge. Left eye exhibits no discharge.  Neck: Neck supple.  Cardiovascular: Normal rate, regular rhythm, normal heart sounds and intact distal pulses.   Pulmonary/Chest: Effort normal and breath sounds normal. He exhibits tenderness.    Abdominal: Soft. He exhibits no distension. There is no tenderness.  Musculoskeletal: He exhibits no edema.  Neurological: He is alert and oriented to person, place, and time.  CN  2-12 grossly intact, save for subjective numbness/tingling over right face with light touch. Normal strength in all 4 extremities. Normal finger to nose. Unable to sit up for further testing such as gait due to dizziness.  Skin: Skin is warm and dry.  Nursing note and vitals reviewed.   ED Course  Procedures (including critical care time) Labs Review Labs Reviewed  COMPREHENSIVE METABOLIC PANEL - Abnormal; Notable for the following:    Glucose, Bld 296 (*)    ALT 16 (*)    All other components within normal limits  URINE RAPID DRUG SCREEN, HOSP PERFORMED - Abnormal; Notable for the following:    Benzodiazepines POSITIVE (*)    All other components within normal limits  URINALYSIS, ROUTINE W REFLEX MICROSCOPIC (NOT AT [facility]) - Abnormal; Notable for the following:    APPearance CLOUDY (*)    Specific Gravity, Urine 1.037 (*)    Glucose, UA >1000 (*)    All other components within normal limits  URINE MICROSCOPIC-ADD ON - Abnormal; Notable for the following:    Casts HYALINE CASTS (*)    All other components within normal limits  I-STAT CHEM 8, ED - Abnormal; Notable for the following:    Glucose, Bld 300 (*)    All other components within normal limits  ETHANOL  PROTIME-INR  APTT  CBC  DIFFERENTIAL  I-STAT TROPOININ, ED    Imaging Review Ct Head Wo Contrast  03/16/2015   CLINICAL DATA:  Right-sided headache. Left facial and arm numbness and tingling. Dizziness.  EXAM: CT HEAD WITHOUT CONTRAST  TECHNIQUE: Contiguous axial images were obtained from the base of the skull through the vertex without intravenous contrast.  COMPARISON:  None.  FINDINGS: No acute intracranial abnormality. Specifically, no hemorrhage, hydrocephalus, mass lesion, acute infarction, or significant intracranial injury. No acute calvarial abnormality. Visualized paranasal sinuses and mastoids clear. Orbital soft tissues unremarkable.  IMPRESSION: No acute intracranial abnormality.   Electronically Signed   By:  Charlett Nose M.D.   On: 03/16/2015 12:54   Dg Chest Portable 1 View  03/16/2015   CLINICAL DATA:  Right-sided headache and left-sided facial numbness and tingling. Leg swelling and dizziness. Symptoms for 2 days.  EXAM: PORTABLE CHEST 1 VIEW  COMPARISON:  None.  FINDINGS: The cardiac silhouette, mediastinal and hilar contours are within normal limits. The lungs are clear. Mild eventration of the right hemidiaphragm. The bony thorax is intact.  IMPRESSION: No acute cardiopulmonary findings.   Electronically Signed   By: Rudie Meyer M.D.   On: 03/16/2015 12:47   I have personally reviewed and evaluated these images and lab results as part of my medical decision-making.   EKG Interpretation   Date/Time:  Wednesday March 16 2015 11:39:32 EDT Ventricular Rate:  80 PR Interval:  144 QRS Duration: 89 QT Interval:  387 QTC Calculation: 446 R Axis:   92 Text Interpretation:  Sinus rhythm Consider right ventricular hypertrophy  no acute ST/T changes No old tracing to compare Confirmed by GOLDSTON  MD,  SCOTT ([id number]) on 03/16/2015 11:42:07 AM      MDM   Final diagnoses:  None    Patient feels better after multiple treatments for vertigo but still feels like he cannot get up and walk yet. Patient to get MRI to rule out acute intracranial process such as stroke causing his symptoms. Has a nonfocal neuro exam and I am unable to cannulate the patient. He does not qualify as a code stroke given his symptoms started yesterday morning. Care transferred to Dr. Adela Lank with MRI pending.    Pricilla Loveless, MD 03/16/15 [phone]

## 2015-03-16 NOTE — ED Notes (Signed)
Patient transported to CT 

## 2015-03-16 NOTE — ED Notes (Signed)
Pt ambulated in room and got dressed with no assistance.

## 2015-03-16 NOTE — Discharge Instructions (Signed)

## 2015-03-16 NOTE — ED Notes (Signed)
Right sided headache, especially behind right eye. Left sided facial and arm numbness and tingling. "Feels like a bubble broke in my head yesterday." "My right eye feels swollen too." Reports recent history of migraines. Dizziness with sitting up and position changes, ringing in ears and vomiting several times over the last 2 days.

## 2015-03-21 ENCOUNTER — Telehealth: Payer: Self-pay | Admitting: Family Medicine

## 2015-03-21 NOTE — Telephone Encounter (Signed)
lmom for patient to make an appt with Geoffery Spruce for a hospital f/u tomorrow please get intouch with Kennyth Arnold about this patients appt for tomorrow

## 2015-03-22 ENCOUNTER — Encounter: Payer: Self-pay | Admitting: Physician Assistant

## 2015-03-22 ENCOUNTER — Ambulatory Visit: Payer: Self-pay | Admitting: Physician Assistant

## 2015-03-22 ENCOUNTER — Ambulatory Visit (INDEPENDENT_AMBULATORY_CARE_PROVIDER_SITE_OTHER): Payer: Medicare Other | Admitting: Physician Assistant

## 2015-03-22 VITALS — BP 120/70 | HR 71 | Temp 98.5°F | Resp 16 | Ht 69.5 in | Wt 215.0 lb

## 2015-03-22 DIAGNOSIS — R51 Headache: Secondary | ICD-10-CM

## 2015-03-22 DIAGNOSIS — R519 Headache, unspecified: Secondary | ICD-10-CM

## 2015-03-22 DIAGNOSIS — F4310 Post-traumatic stress disorder, unspecified: Secondary | ICD-10-CM

## 2015-03-22 DIAGNOSIS — G894 Chronic pain syndrome: Secondary | ICD-10-CM

## 2015-03-22 DIAGNOSIS — Z794 Long term (current) use of insulin: Secondary | ICD-10-CM | POA: Diagnosis not present

## 2015-03-22 DIAGNOSIS — R42 Dizziness and giddiness: Secondary | ICD-10-CM

## 2015-03-22 DIAGNOSIS — E118 Type 2 diabetes mellitus with unspecified complications: Secondary | ICD-10-CM

## 2015-03-22 DIAGNOSIS — F418 Other specified anxiety disorders: Secondary | ICD-10-CM | POA: Diagnosis not present

## 2015-03-22 DIAGNOSIS — F329 Major depressive disorder, single episode, unspecified: Secondary | ICD-10-CM

## 2015-03-22 DIAGNOSIS — F419 Anxiety disorder, unspecified: Secondary | ICD-10-CM

## 2015-03-22 LAB — GLUCOSE, POCT (MANUAL RESULT ENTRY): POC Glucose: 240 mg/dl — AB (ref 70–99)

## 2015-03-22 LAB — HEMOGLOBIN A1C: HEMOGLOBIN A1C: 8.4 % — AB (ref 4.0–6.0)

## 2015-03-22 LAB — POCT GLYCOSYLATED HEMOGLOBIN (HGB A1C): HEMOGLOBIN A1C: 8.1

## 2015-03-22 MED ORDER — MECLIZINE HCL 50 MG PO TABS: 50.0000 mg | ORAL_TABLET | Freq: Two times a day (BID) | ORAL | Status: DC | PRN

## 2015-03-22 MED ORDER — LORAZEPAM 1 MG PO TABS: 1.0000 mg | ORAL_TABLET | Freq: Two times a day (BID) | ORAL | Status: DC | PRN

## 2015-03-22 NOTE — Patient Instructions (Signed)
Continue meclizine as needed for dizziness. You will get a phone call to make appointment with neurology.. If you haven't heard about this referral in 2 weeks, call the [place]. You are doing great with your diabetes, keep it up. Call me if you have any questions.

## 2015-03-22 NOTE — Progress Notes (Signed)
[facility] [address] [name] [address] [phone]  Date:  03/22/2015   Name:  Jeremy Medina   DOB:  [birth year]   MRN:  [id number]  PCP:  Dorna Leitz, PA-C    Chief Complaint: Follow-up and Vertigo   History of Present Illness:  This is a 64 y.o. male with PMH DM2 uncontrolled, chronic pain syndrome, PTSD who is presenting for hospital follow up. He was seen in the ED on 03/16/15 for vertigo and right sided pulsating headache. Urine drug screen, alcohol, CBC troponin all negative. UA with >1000 glucose and glucose 300 on CMP. CXR nml. CT head negative. MRI brain with following impression: IMPRESSION: Periventricular and scattered subcortical T2 hyperintensities bilaterally. The finding is nonspecific but can be seen in the setting of chronic microvascular ischemia, a demyelinating process such as multiple sclerosis, vasculitis, complicated migraine headaches, or as the sequelae of a prior infectious or inflammatory process. No acute intracranial abnormality.  Pt's symptoms improved by discharge from ED.  I last saw pt 11/30/14. At that time his A1C was 9.0. He is under care of Dr. Hardie Lora at the [facility]. He was getting methadone, novolog and lantus from them. Was taking lantus 10 mg BID and novolog 10 mg q meals. Discussed case with Dr. Milus Glazier who suggested increased lantus to 30 mg QHS, d'c novolog and add invokana. Pt was unable to continue invokana d/t cost and side effects (myalgias) and went back to prior regimen. He saw Dr. Hardie Lora in the past 1 month and regimen now: 30 unit lantus BID and 10 units novolog with meals. FBG 120-130. Before bed BG 145. He feels he is doing much better with his DM control.   Pt states his vertigo is some better since discharge from ED but "still very bad". For 2 years he has had a constant headache. Finds it difficult to concentrate. Also has constant ringing "in my head". Feels like "ringing out a wash rag".  He has had intermittent vertigo during the past 2 years but never as bad as current episode. Never seen a neurologist. He feels Dr. Hardie Lora is not listening to him - he states he tells her every visit about his headaches and nothing is ever done. He denies new weakness, numbness, diff with speech. He has had some n/v. He has been taking meclizine BID and helping some.  Pt states he is very depressed and does not sleep well. He wakes frequently during the night with nightmares. 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 He states he has never been on anything for mood. However, after listing several medications (zoloft, prozac, cymbalta, amitriptyline) it sounds like he has been on several but unable to tolerate. He denies SI/HI. He is wondering if he can get something to help him sleep over the next few days -- he has had ativan in the past which has helped.  Review of Systems:  Review of Systems See HPI  Patient Active Problem List   Diagnosis Date Noted  . Type 2 diabetes mellitus with complication (HCC) 11/30/2014   Prior to Admission medications   Medication Sig Start Date End Date Taking? Authorizing Provider  atorvastatin (LIPITOR) 80 MG tablet Take 80 mg by mouth daily.   Yes Historical Provider, MD  insulin glargine (LANTUS) 100 UNIT/ML injection Inject 30 Units into the skin daily.   Yes Historical Provider, MD  meclizine (ANTIVERT) 50 MG tablet Take 1 tablet (50 mg total) by mouth  2 (two) times daily as needed. 03/22/15  Yes Lanier Clam V, PA-C  methadone (DOLOPHINE) 10 MG tablet Take 5-10 mg by mouth 3 (three) times daily. Take 10 mg by mouth every morning, 5 mg every afternoon, and 10 mg every night   Yes Historical Provider, MD     Allergies  Allergen Reactions  . Bee Venom Anaphylaxis    Past Surgical History  Procedure Laterality Date  . Back surgery      Social History  Substance Use Topics  . Smoking status: Current Every Day Smoker -- 0.50 packs/day    Types: Cigarettes   . Smokeless tobacco: None  . Alcohol Use: No    Family History  Problem Relation Age of Onset  . Diabetes Mother   . Dementia Mother   . Diabetes Father   . Hypertension Father   . Dementia Father   . Hyperlipidemia Father     Medication list has been reviewed and updated.  Physical Examination:  Physical Exam  Constitutional: He is oriented to person, place, and time. He appears well-developed and well-nourished. No distress.  HENT:  Head: Normocephalic and atraumatic.  Right Ear: Hearing, tympanic membrane, external ear and ear canal normal.  Left Ear: Hearing, tympanic membrane, external ear and ear canal normal.  Nose: Nose normal.  Mouth/Throat: Uvula is midline, oropharynx is clear and moist and mucous membranes are normal.  Pt did not want to try dix-hallpike/epley maneuvers for fear of vomiting  Eyes: Conjunctivae, EOM and lids are normal. Pupils are equal, round, and reactive to light. Right eye exhibits no discharge. Left eye exhibits no discharge. No scleral icterus.  Neck: Carotid bruit is not present. No thyromegaly present.  Cardiovascular: Normal rate, regular rhythm, normal heart sounds and normal pulses.   No murmur heard. Pulmonary/Chest: Effort normal and breath sounds normal. No respiratory distress. He has no wheezes. He has no rhonchi. He has no rales.  Musculoskeletal: Normal range of motion.  Lymphadenopathy:       Head (right side): No submental, no submandibular and no tonsillar adenopathy present.       Head (left side): No submental, no submandibular and no tonsillar adenopathy present.    He has no cervical adenopathy.  Neurological: He is alert and oriented to person, place, and time. He has normal reflexes. No cranial nerve deficit. He displays a negative Romberg sign.  Left arm and left leg 4/5 strength - chronic Otherwise 5/5 strength throughout Right scalp sensitive to touch No sensation deficit  Skin: Skin is warm, dry and intact. No  lesion and no rash noted.  Psychiatric: He has a normal mood and affect. His speech is normal and behavior is normal. Thought content normal.    BP 120/70 mmHg  Pulse 71  Temp(Src) 98.5 F (36.9 C) (Oral)  Resp 16  Ht 5' 9.5" (1.765 m)  Wt 215 lb (97.523 kg)  BMI 31.31 kg/m2  SpO2 98%  Results for orders placed or performed in visit on 03/22/15  POCT glucose (manual entry)  Result Value Ref Range   POC Glucose 240 (A) 70 - 99 mg/dl  POCT glycosylated hemoglobin (Hb A1C)  Result Value Ref Range   Hemoglobin A1C 8.1    Assessment and Plan:  1. Type 2 diabetes mellitus with complication, with long-term current use of insulin (HCC) A1C decreased from 9 to 8.1. Doing much better on current regimen. Follow up with Dr. Hardie Lora. - POCT glucose (manual entry) - POCT glycosylated hemoglobin (Hb A1C)  2. Chronic nonintractable headache, unspecified headache type 3. Vertigo Pt has had chronic headache and intermittent vertigo for the past 2 years. Pt is adamant that he has told his PCP this every visit for the past 2 years and nothing has been done. MRI at the ED showed chronic changes, no acute changes. I called Dr. Susanne Borders clinic in [place] and was able to speak to her nurse. She confirmed that they reviewed his ED notes and already placed a referral to neurology. He will continue meclizine BID prn vertigo in the meantime. He asked for ativan to help him sleep -- [place] controlled substance database showed he has not received ativan in the past 6 months. Will give #30 tabs until he is able to see PCP and/or neuro. While I am happy to see patient, there are too many hands in the pot currently. We discussed he will need to see ONE PCP going forward -- pt understands. - meclizine (ANTIVERT) 50 MG tablet; Take 1 tablet (50 mg total) by mouth 2 (two) times daily as needed.  Dispense: 30 tablet; Refill: 1  4. Chronic pain syndrome Continue methadone rx'd by PCP.  5. Anxiety and depression 6.  PTSD Pt has uncontrolled depression/anxiety/PTSD -- sounds like he has been unable to tolerate meds for his mood in the past. Follow up with PCP. - LORazepam (ATIVAN) 1 MG tablet; Take 1 tablet (1 mg total) by mouth 2 (two) times daily as needed for anxiety.  Dispense: 30 tablet; Refill: 0    [place] Miners. Dyke Brackett, MHS [facility]  03/24/2015

## 2015-03-24 DIAGNOSIS — R51 Headache: Secondary | ICD-10-CM

## 2015-03-24 DIAGNOSIS — G894 Chronic pain syndrome: Secondary | ICD-10-CM | POA: Insufficient documentation

## 2015-03-24 DIAGNOSIS — R519 Headache, unspecified: Secondary | ICD-10-CM | POA: Insufficient documentation

## 2015-03-24 DIAGNOSIS — R42 Dizziness and giddiness: Secondary | ICD-10-CM | POA: Insufficient documentation

## 2015-03-24 DIAGNOSIS — F4310 Post-traumatic stress disorder, unspecified: Secondary | ICD-10-CM | POA: Insufficient documentation

## 2015-03-30 ENCOUNTER — Telehealth: Payer: Self-pay

## 2015-03-30 NOTE — Telephone Encounter (Signed)
Debbie  Patients son got a job two days ago, and no reason the doctor Dr. Shon BatonBrooks at [facility] - there and dismissed him with only five days of medication.  He has been to all of the meetings.  If he does not get the medication he is in jeopardy of losing this new job.   [phone]  Call Theodis Aguasherman, Dad

## 2015-04-01 NOTE — Telephone Encounter (Signed)
PATIENT'S SON ([name]) CALLED BACK WANTING TO KNOW WHY WE HAD NOT CALLED HIS FATHER BACK YET? HE WOULD LIKE TO GET A  RETURN CALL AS SOON AS POSSIBLE. IF WE CALL BACK BEFORE 12:00 NOON PLEASE CALL [name] AT [phone]. AFTER THAT PLEASE CALL MR. Butkiewicz AT [phone].  MBC

## 2015-04-01 NOTE — Telephone Encounter (Signed)
I spoke with Jeremy Medina and the encounter is under his name.

## 2015-04-01 NOTE — Progress Notes (Signed)
  Medical screening examination/treatment/procedure(s) were performed by non-physician practitioner and as supervising physician I was immediately available for consultation/collaboration.     

## 2015-04-04 ENCOUNTER — Telehealth: Payer: Self-pay | Admitting: Family Medicine

## 2015-04-11 ENCOUNTER — Encounter: Payer: Self-pay | Admitting: Family Medicine

## 2015-05-24 NOTE — Telephone Encounter (Signed)
ERROR

## 2015-06-28 ENCOUNTER — Emergency Department (HOSPITAL_COMMUNITY): Payer: Non-veteran care

## 2015-06-28 ENCOUNTER — Emergency Department (HOSPITAL_COMMUNITY)
Admission: EM | Admit: 2015-06-28 | Discharge: 2015-06-28 | Disposition: A | Discharge status: Home / Self Care | Payer: Non-veteran care | Attending: Emergency Medicine | Admitting: Emergency Medicine

## 2015-06-28 ENCOUNTER — Encounter (HOSPITAL_COMMUNITY): Payer: Self-pay | Admitting: *Deleted

## 2015-06-28 DIAGNOSIS — M1990 Unspecified osteoarthritis, unspecified site: Secondary | ICD-10-CM | POA: Insufficient documentation

## 2015-06-28 DIAGNOSIS — S2231XA Fracture of one rib, right side, initial encounter for closed fracture: Secondary | ICD-10-CM | POA: Diagnosis not present

## 2015-06-28 DIAGNOSIS — I10 Essential (primary) hypertension: Secondary | ICD-10-CM | POA: Diagnosis not present

## 2015-06-28 DIAGNOSIS — S29001A Unspecified injury of muscle and tendon of front wall of thorax, initial encounter: Secondary | ICD-10-CM | POA: Diagnosis present

## 2015-06-28 DIAGNOSIS — Z79899 Other long term (current) drug therapy: Secondary | ICD-10-CM | POA: Insufficient documentation

## 2015-06-28 DIAGNOSIS — F17210 Nicotine dependence, cigarettes, uncomplicated: Secondary | ICD-10-CM | POA: Diagnosis not present

## 2015-06-28 DIAGNOSIS — Y998 Other external cause status: Secondary | ICD-10-CM | POA: Diagnosis not present

## 2015-06-28 DIAGNOSIS — E785 Hyperlipidemia, unspecified: Secondary | ICD-10-CM | POA: Diagnosis not present

## 2015-06-28 DIAGNOSIS — Y9289 Other specified places as the place of occurrence of the external cause: Secondary | ICD-10-CM | POA: Diagnosis not present

## 2015-06-28 DIAGNOSIS — Z794 Long term (current) use of insulin: Secondary | ICD-10-CM | POA: Diagnosis not present

## 2015-06-28 DIAGNOSIS — W010XXA Fall on same level from slipping, tripping and stumbling without subsequent striking against object, initial encounter: Secondary | ICD-10-CM | POA: Insufficient documentation

## 2015-06-28 DIAGNOSIS — Y9301 Activity, walking, marching and hiking: Secondary | ICD-10-CM | POA: Diagnosis not present

## 2015-06-28 MED ORDER — HYDROCODONE-ACETAMINOPHEN 5-325 MG PO TABS: 1.0000 | ORAL_TABLET | Freq: Four times a day (QID) | ORAL | Status: DC | PRN

## 2015-06-28 MED ORDER — HYDROCODONE-ACETAMINOPHEN 5-325 MG PO TABS
2.0000 | ORAL_TABLET | Freq: Once | ORAL | Status: AC
  Administered 2015-06-28: 2 via ORAL
  Filled 2015-06-28: qty 2

## 2015-06-28 NOTE — ED Notes (Signed)
Pt reports last night he was walking, slipped on his sock, fell onto his right side onto a large wooden toy box. Now having right sided rib pain. Pain 10/10. Reports ribs stick out in one spot. No bruising noted.

## 2015-06-28 NOTE — Discharge Instructions (Signed)

## 2015-06-28 NOTE — ED Provider Notes (Signed)
CSN: [id number]     Arrival date & time 06/28/15  4098 History   First MD Initiated Contact with Patient 06/28/15 1000     Chief Complaint  Patient presents with  . Fall  . Rib Pain      (Consider location/radiation/quality/duration/timing/severity/associated sxs/prior Treatment) HPI Comments: Patient presents to the emergency department with chief complaint of right rib pain. Patient states that he slipped while walking in stocking feet this morning. States that he fell and landed on a large wouldn't weigh box. He landed on his right ribs. He states that he was in so much pain that he had difficulty getting off of the ground without help from his son. He reports increased pain with deep breathing. Denies shortness of breath at rest. Has had not tried taking anything for her symptoms. Symptoms are aggravated with palpation.  The history is provided by the patient. No language interpreter was used.    Past Medical History  Diagnosis Date  . Arthritis   . Hypertension   . Hyperlipidemia    Past Surgical History  Procedure Laterality Date  . Back surgery     Family History  Problem Relation Age of Onset  . Diabetes Mother   . Dementia Mother   . Diabetes Father   . Hypertension Father   . Dementia Father   . Hyperlipidemia Father    Social History  Substance Use Topics  . Smoking status: Current Every Day Smoker -- 0.50 packs/day    Types: Cigarettes  . Smokeless tobacco: None  . Alcohol Use: No    Review of Systems  Constitutional: Negative for fever and chills.  Respiratory: Negative for shortness of breath.   Cardiovascular: Negative for chest pain.  Gastrointestinal: Negative for nausea, vomiting, diarrhea and constipation.  Genitourinary: Negative for dysuria.  Musculoskeletal: Positive for arthralgias.  All other systems reviewed and are negative.     Allergies  Bee venom  Home Medications   Prior to Admission medications   Medication Sig Start Date End  Date Taking? Authorizing Provider  atorvastatin (LIPITOR) 80 MG tablet Take 80 mg by mouth daily.    Historical Provider, MD  HYDROcodone-acetaminophen (NORCO/VICODIN) 5-325 MG tablet Take 1-2 tablets by mouth every 6 (six) hours as needed. 06/28/15   Roxy Horseman, PA-C  insulin glargine (LANTUS) 100 UNIT/ML injection Inject 30 Units into the skin daily.    Historical Provider, MD  LORazepam (ATIVAN) 1 MG tablet Take 1 tablet (1 mg total) by mouth 2 (two) times daily as needed for anxiety. 03/22/15   Dorna Leitz, PA-C  meclizine (ANTIVERT) 50 MG tablet Take 1 tablet (50 mg total) by mouth 2 (two) times daily as needed. 03/22/15   Dorna Leitz, PA-C  methadone (DOLOPHINE) 10 MG tablet Take 5-10 mg by mouth 3 (three) times daily. Take 10 mg by mouth every morning, 5 mg every afternoon, and 10 mg every night    Historical Provider, MD   BP 144/97 mmHg  Pulse 85  Temp(Src) 97.9 F (36.6 C) (Oral)  Resp 16  SpO2 97% Physical Exam  Constitutional: He is oriented to person, place, and time. He appears well-developed and well-nourished.  HENT:  Head: Normocephalic and atraumatic.  Eyes: Conjunctivae and EOM are normal. Pupils are equal, round, and reactive to light. Right eye exhibits no discharge. Left eye exhibits no discharge. No scleral icterus.  Neck: Normal range of motion. Neck supple. No JVD present.  Cardiovascular: Normal rate, regular rhythm and normal heart sounds.  Exam reveals no gallop and no friction rub.   No murmur heard. Pulmonary/Chest: Effort normal and breath sounds normal. No respiratory distress. He has no wheezes. He has no rales. He exhibits no tenderness.  CTAB Right ribs ttp No bony abnormality or deformity  Abdominal: Soft. He exhibits no distension and no mass. There is no tenderness. There is no rebound and no guarding.  No focal abdominal tenderness, no RLQ tenderness or pain at McBurney's point, no RUQ tenderness or Murphy's sign, no left-sided abdominal  tenderness, no fluid wave, or signs of peritonitis   Musculoskeletal: Normal range of motion. He exhibits no edema or tenderness.  Neurological: He is alert and oriented to person, place, and time.  Skin: Skin is warm and dry.  Psychiatric: He has a normal mood and affect. His behavior is normal. Judgment and thought content normal.  Nursing note and vitals reviewed.   ED Course  Procedures (including critical care time) Labs Review Labs Reviewed - No data to display  Imaging Review Dg Chest 2 View  06/28/2015  CLINICAL DATA:  Fall, right chest wall and rib pain. EXAM: CHEST  2 VIEW COMPARISON:  03/16/2015 FINDINGS: Minor streaky bibasilar atelectasis. No focal pneumonia, collapse or consolidation. Negative for edema, effusion or pneumothorax. Irregularity of the inferior lateral right ninth and tenth ribs suspicious for fractures. Healed left seventh rib fracture also noted. IMPRESSION: Findings suspicious for right ninth and tenth rib fractures. No effusion or pneumothorax Bibasilar atelectasis Electronically Signed   By: Judie PetitM.  Shick M.D.   On: 06/28/2015 10:21   I have personally reviewed and evaluated these images and lab results as part of my medical decision-making.    MDM   Final diagnoses:  Rib fractures, right, closed, initial encounter    Patient with right rib pain. He had a mechanical fall. Will check chest x-ray.  Chest x-ray remarkable for right ninth and 10th rib fractures. Lungs are clear. No evidence of pneumothorax. Patient seen by and discussed with Dr. Denton LankSteinl, who agrees with plan for pain control and incentive spirometry. Recommend close follow-up with primary care provider. Return precautions given. Patient is stable and ready for discharge.     Roxy Horsemanobert Takeesha Isley, PA-C 06/28/15 1116  Cathren LaineKevin Steinl, MD 07/05/15 2204

## 2015-07-12 ENCOUNTER — Ambulatory Visit: Payer: Medicare Other | Admitting: Physician Assistant

## 2015-08-25 ENCOUNTER — Emergency Department (HOSPITAL_COMMUNITY): Payer: Medicare Other

## 2015-08-25 ENCOUNTER — Encounter (HOSPITAL_COMMUNITY): Payer: Self-pay | Admitting: Emergency Medicine

## 2015-08-25 ENCOUNTER — Inpatient Hospital Stay (HOSPITAL_COMMUNITY)
Admission: EM | Admit: 2015-08-25 | Discharge: 2015-08-31 | DRG: 184 | Disposition: A | Discharge status: Home Health | Payer: Medicare Other | Attending: General Surgery | Admitting: General Surgery

## 2015-08-25 DIAGNOSIS — E118 Type 2 diabetes mellitus with unspecified complications: Secondary | ICD-10-CM | POA: Diagnosis present

## 2015-08-25 DIAGNOSIS — S060X0A Concussion without loss of consciousness, initial encounter: Secondary | ICD-10-CM | POA: Diagnosis present

## 2015-08-25 DIAGNOSIS — E119 Type 2 diabetes mellitus without complications: Secondary | ICD-10-CM

## 2015-08-25 DIAGNOSIS — M1990 Unspecified osteoarthritis, unspecified site: Secondary | ICD-10-CM | POA: Diagnosis present

## 2015-08-25 DIAGNOSIS — I10 Essential (primary) hypertension: Secondary | ICD-10-CM | POA: Diagnosis present

## 2015-08-25 DIAGNOSIS — Z79891 Long term (current) use of opiate analgesic: Secondary | ICD-10-CM

## 2015-08-25 DIAGNOSIS — D62 Acute posthemorrhagic anemia: Secondary | ICD-10-CM | POA: Diagnosis not present

## 2015-08-25 DIAGNOSIS — S82831A Other fracture of upper and lower end of right fibula, initial encounter for closed fracture: Secondary | ICD-10-CM | POA: Diagnosis not present

## 2015-08-25 DIAGNOSIS — S2242XA Multiple fractures of ribs, left side, initial encounter for closed fracture: Secondary | ICD-10-CM

## 2015-08-25 DIAGNOSIS — Z91030 Bee allergy status: Secondary | ICD-10-CM | POA: Diagnosis not present

## 2015-08-25 DIAGNOSIS — S82434A Nondisplaced oblique fracture of shaft of right fibula, initial encounter for closed fracture: Secondary | ICD-10-CM | POA: Diagnosis present

## 2015-08-25 DIAGNOSIS — M25571 Pain in right ankle and joints of right foot: Secondary | ICD-10-CM | POA: Diagnosis not present

## 2015-08-25 DIAGNOSIS — G43709 Chronic migraine without aura, not intractable, without status migrainosus: Secondary | ICD-10-CM | POA: Insufficient documentation

## 2015-08-25 DIAGNOSIS — Y92512 Supermarket, store or market as the place of occurrence of the external cause: Secondary | ICD-10-CM

## 2015-08-25 DIAGNOSIS — S060X9A Concussion with loss of consciousness of unspecified duration, initial encounter: Secondary | ICD-10-CM | POA: Diagnosis present

## 2015-08-25 DIAGNOSIS — F4310 Post-traumatic stress disorder, unspecified: Secondary | ICD-10-CM | POA: Diagnosis present

## 2015-08-25 DIAGNOSIS — Z23 Encounter for immunization: Secondary | ICD-10-CM

## 2015-08-25 DIAGNOSIS — M25512 Pain in left shoulder: Secondary | ICD-10-CM | POA: Diagnosis not present

## 2015-08-25 DIAGNOSIS — M79671 Pain in right foot: Secondary | ICD-10-CM | POA: Diagnosis not present

## 2015-08-25 DIAGNOSIS — S2243XA Multiple fractures of ribs, bilateral, initial encounter for closed fracture: Secondary | ICD-10-CM | POA: Diagnosis not present

## 2015-08-25 DIAGNOSIS — E785 Hyperlipidemia, unspecified: Secondary | ICD-10-CM | POA: Diagnosis present

## 2015-08-25 DIAGNOSIS — S2249XA Multiple fractures of ribs, unspecified side, initial encounter for closed fracture: Secondary | ICD-10-CM | POA: Diagnosis present

## 2015-08-25 DIAGNOSIS — S82401A Unspecified fracture of shaft of right fibula, initial encounter for closed fracture: Secondary | ICD-10-CM | POA: Diagnosis present

## 2015-08-25 DIAGNOSIS — Z720 Tobacco use: Secondary | ICD-10-CM | POA: Insufficient documentation

## 2015-08-25 DIAGNOSIS — S3991XA Unspecified injury of abdomen, initial encounter: Secondary | ICD-10-CM | POA: Diagnosis not present

## 2015-08-25 DIAGNOSIS — F17210 Nicotine dependence, cigarettes, uncomplicated: Secondary | ICD-10-CM | POA: Diagnosis not present

## 2015-08-25 DIAGNOSIS — M25511 Pain in right shoulder: Secondary | ICD-10-CM | POA: Diagnosis not present

## 2015-08-25 DIAGNOSIS — G894 Chronic pain syndrome: Secondary | ICD-10-CM | POA: Diagnosis not present

## 2015-08-25 DIAGNOSIS — M7989 Other specified soft tissue disorders: Secondary | ICD-10-CM | POA: Diagnosis not present

## 2015-08-25 DIAGNOSIS — M79604 Pain in right leg: Secondary | ICD-10-CM | POA: Diagnosis not present

## 2015-08-25 DIAGNOSIS — S3993XA Unspecified injury of pelvis, initial encounter: Secondary | ICD-10-CM | POA: Diagnosis not present

## 2015-08-25 DIAGNOSIS — S199XXA Unspecified injury of neck, initial encounter: Secondary | ICD-10-CM | POA: Diagnosis not present

## 2015-08-25 DIAGNOSIS — S0101XA Laceration without foreign body of scalp, initial encounter: Secondary | ICD-10-CM | POA: Diagnosis present

## 2015-08-25 DIAGNOSIS — S299XXA Unspecified injury of thorax, initial encounter: Secondary | ICD-10-CM | POA: Diagnosis not present

## 2015-08-25 DIAGNOSIS — M159 Polyosteoarthritis, unspecified: Secondary | ICD-10-CM | POA: Insufficient documentation

## 2015-08-25 DIAGNOSIS — S0990XA Unspecified injury of head, initial encounter: Secondary | ICD-10-CM | POA: Diagnosis not present

## 2015-08-25 DIAGNOSIS — F4323 Adjustment disorder with mixed anxiety and depressed mood: Secondary | ICD-10-CM | POA: Insufficient documentation

## 2015-08-25 DIAGNOSIS — S060XAA Concussion with loss of consciousness status unknown, initial encounter: Secondary | ICD-10-CM | POA: Diagnosis present

## 2015-08-25 HISTORY — DX: Other chronic pain: G89.29

## 2015-08-25 HISTORY — DX: Migraine, unspecified, not intractable, without status migrainosus: G43.909

## 2015-08-25 HISTORY — DX: Major depressive disorder, single episode, unspecified: F32.9

## 2015-08-25 HISTORY — DX: Anxiety disorder, unspecified: F41.9

## 2015-08-25 HISTORY — DX: Dorsalgia, unspecified: M54.9

## 2015-08-25 HISTORY — DX: Type 2 diabetes mellitus without complications: E11.9

## 2015-08-25 HISTORY — DX: Depression, unspecified: F32.A

## 2015-08-25 LAB — CBC
HCT: 39.8 % (ref 39.0–52.0)
HEMOGLOBIN: 13.3 g/dL (ref 13.0–17.0)
MCH: 28 pg (ref 26.0–34.0)
MCHC: 33.4 g/dL (ref 30.0–36.0)
MCV: 83.8 fL (ref 78.0–100.0)
Platelets: 165 10*3/uL (ref 150–400)
RBC: 4.75 MIL/uL (ref 4.22–5.81)
RDW: 13.6 % (ref 11.5–15.5)
WBC: 11.7 10*3/uL — ABNORMAL HIGH (ref 4.0–10.5)

## 2015-08-25 LAB — COMPREHENSIVE METABOLIC PANEL
ALBUMIN: 3.6 g/dL (ref 3.5–5.0)
ALT: 30 U/L (ref 17–63)
ANION GAP: 8 (ref 5–15)
AST: 31 U/L (ref 15–41)
Alkaline Phosphatase: 98 U/L (ref 38–126)
BILIRUBIN TOTAL: 0.3 mg/dL (ref 0.3–1.2)
BUN: 16 mg/dL (ref 6–20)
CHLORIDE: 99 mmol/L — AB (ref 101–111)
CO2: 29 mmol/L (ref 22–32)
Calcium: 9.5 mg/dL (ref 8.9–10.3)
Creatinine, Ser: 1.22 mg/dL (ref 0.61–1.24)
GFR calc Af Amer: >60 mL/min (ref >60)
GFR calc non Af Amer: >60 mL/min (ref >60)
GLUCOSE: 491 mg/dL — AB (ref 65–99)
POTASSIUM: 4.8 mmol/L (ref 3.5–5.1)
SODIUM: 136 mmol/L (ref 135–145)
TOTAL PROTEIN: 6.7 g/dL (ref 6.5–8.1)

## 2015-08-25 LAB — PROTIME-INR
INR: 0.98 (ref 0.00–1.49)
PROTHROMBIN TIME: 13.2 s (ref 11.6–15.2)

## 2015-08-25 LAB — ETHANOL: Alcohol, Ethyl (B): <5 mg/dL (ref <5)

## 2015-08-25 LAB — CBG MONITORING, ED: Glucose-Capillary: 431 mg/dL — ABNORMAL HIGH (ref 65–99)

## 2015-08-25 MED ORDER — SODIUM CHLORIDE 0.9 % IV BOLUS (SEPSIS)
500.0000 mL | Freq: Once | INTRAVENOUS | Status: AC
  Administered 2015-08-25: 500 mL via INTRAVENOUS

## 2015-08-25 MED ORDER — TETANUS-DIPHTH-ACELL PERTUSSIS 5-2.5-18.5 LF-MCG/0.5 IM SUSP
0.5000 mL | Freq: Once | INTRAMUSCULAR | Status: AC
  Administered 2015-08-25: 0.5 mL via INTRAMUSCULAR
  Filled 2015-08-25: qty 0.5

## 2015-08-25 MED ORDER — HYDROMORPHONE HCL 1 MG/ML IJ SOLN
1.0000 mg | Freq: Once | INTRAMUSCULAR | Status: AC
  Administered 2015-08-25: 1 mg via INTRAVENOUS
  Filled 2015-08-25: qty 1

## 2015-08-25 MED ORDER — IOHEXOL 300 MG/ML  SOLN
100.0000 mL | Freq: Once | INTRAMUSCULAR | Status: AC | PRN
  Administered 2015-08-26: 100 mL via INTRAVENOUS

## 2015-08-25 NOTE — ED Notes (Signed)
Pt taken to CT.
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
## 2015-08-26 DIAGNOSIS — M25512 Pain in left shoulder: Secondary | ICD-10-CM | POA: Diagnosis not present

## 2015-08-26 DIAGNOSIS — M25511 Pain in right shoulder: Secondary | ICD-10-CM | POA: Diagnosis not present

## 2015-08-26 DIAGNOSIS — S2243XA Multiple fractures of ribs, bilateral, initial encounter for closed fracture: Secondary | ICD-10-CM | POA: Diagnosis present

## 2015-08-26 DIAGNOSIS — M25571 Pain in right ankle and joints of right foot: Secondary | ICD-10-CM | POA: Diagnosis not present

## 2015-08-26 DIAGNOSIS — I10 Essential (primary) hypertension: Secondary | ICD-10-CM | POA: Diagnosis present

## 2015-08-26 DIAGNOSIS — F17210 Nicotine dependence, cigarettes, uncomplicated: Secondary | ICD-10-CM | POA: Diagnosis present

## 2015-08-26 DIAGNOSIS — E119 Type 2 diabetes mellitus without complications: Secondary | ICD-10-CM | POA: Diagnosis not present

## 2015-08-26 DIAGNOSIS — S2249XA Multiple fractures of ribs, unspecified side, initial encounter for closed fracture: Secondary | ICD-10-CM | POA: Diagnosis not present

## 2015-08-26 DIAGNOSIS — M7989 Other specified soft tissue disorders: Secondary | ICD-10-CM | POA: Diagnosis not present

## 2015-08-26 DIAGNOSIS — S2242XA Multiple fractures of ribs, left side, initial encounter for closed fracture: Secondary | ICD-10-CM | POA: Diagnosis not present

## 2015-08-26 DIAGNOSIS — S060X0A Concussion without loss of consciousness, initial encounter: Secondary | ICD-10-CM | POA: Diagnosis present

## 2015-08-26 DIAGNOSIS — S3991XA Unspecified injury of abdomen, initial encounter: Secondary | ICD-10-CM | POA: Diagnosis not present

## 2015-08-26 DIAGNOSIS — S3993XA Unspecified injury of pelvis, initial encounter: Secondary | ICD-10-CM | POA: Diagnosis not present

## 2015-08-26 DIAGNOSIS — E785 Hyperlipidemia, unspecified: Secondary | ICD-10-CM | POA: Diagnosis present

## 2015-08-26 DIAGNOSIS — Z91030 Bee allergy status: Secondary | ICD-10-CM | POA: Diagnosis not present

## 2015-08-26 DIAGNOSIS — M79604 Pain in right leg: Secondary | ICD-10-CM | POA: Diagnosis not present

## 2015-08-26 DIAGNOSIS — S82401A Unspecified fracture of shaft of right fibula, initial encounter for closed fracture: Secondary | ICD-10-CM | POA: Diagnosis not present

## 2015-08-26 DIAGNOSIS — D62 Acute posthemorrhagic anemia: Secondary | ICD-10-CM | POA: Diagnosis present

## 2015-08-26 DIAGNOSIS — Z23 Encounter for immunization: Secondary | ICD-10-CM | POA: Diagnosis not present

## 2015-08-26 DIAGNOSIS — S82401D Unspecified fracture of shaft of right fibula, subsequent encounter for closed fracture with routine healing: Secondary | ICD-10-CM | POA: Diagnosis not present

## 2015-08-26 DIAGNOSIS — M1990 Unspecified osteoarthritis, unspecified site: Secondary | ICD-10-CM | POA: Diagnosis present

## 2015-08-26 DIAGNOSIS — G894 Chronic pain syndrome: Secondary | ICD-10-CM | POA: Diagnosis not present

## 2015-08-26 DIAGNOSIS — M79671 Pain in right foot: Secondary | ICD-10-CM | POA: Diagnosis not present

## 2015-08-26 DIAGNOSIS — S0990XA Unspecified injury of head, initial encounter: Secondary | ICD-10-CM | POA: Diagnosis not present

## 2015-08-26 DIAGNOSIS — S82831A Other fracture of upper and lower end of right fibula, initial encounter for closed fracture: Secondary | ICD-10-CM | POA: Diagnosis not present

## 2015-08-26 DIAGNOSIS — Z79891 Long term (current) use of opiate analgesic: Secondary | ICD-10-CM | POA: Diagnosis not present

## 2015-08-26 DIAGNOSIS — S060X0D Concussion without loss of consciousness, subsequent encounter: Secondary | ICD-10-CM | POA: Diagnosis not present

## 2015-08-26 DIAGNOSIS — S82434A Nondisplaced oblique fracture of shaft of right fibula, initial encounter for closed fracture: Secondary | ICD-10-CM | POA: Diagnosis present

## 2015-08-26 DIAGNOSIS — F4310 Post-traumatic stress disorder, unspecified: Secondary | ICD-10-CM | POA: Diagnosis present

## 2015-08-26 DIAGNOSIS — S0101XA Laceration without foreign body of scalp, initial encounter: Secondary | ICD-10-CM | POA: Diagnosis present

## 2015-08-26 DIAGNOSIS — E118 Type 2 diabetes mellitus with unspecified complications: Secondary | ICD-10-CM | POA: Diagnosis present

## 2015-08-26 DIAGNOSIS — Y92512 Supermarket, store or market as the place of occurrence of the external cause: Secondary | ICD-10-CM | POA: Diagnosis not present

## 2015-08-26 DIAGNOSIS — S199XXA Unspecified injury of neck, initial encounter: Secondary | ICD-10-CM | POA: Diagnosis not present

## 2015-08-26 LAB — CDS SEROLOGY

## 2015-08-26 LAB — BASIC METABOLIC PANEL
Anion gap: 9 (ref 5–15)
BUN: 12 mg/dL (ref 6–20)
CO2: 28 mmol/L (ref 22–32)
Calcium: 8.8 mg/dL — ABNORMAL LOW (ref 8.9–10.3)
Chloride: 99 mmol/L — ABNORMAL LOW (ref 101–111)
Creatinine, Ser: 0.92 mg/dL (ref 0.61–1.24)
GFR calc Af Amer: >60 mL/min (ref >60)
GLUCOSE: 299 mg/dL — AB (ref 65–99)
POTASSIUM: 3.9 mmol/L (ref 3.5–5.1)
Sodium: 136 mmol/L (ref 135–145)

## 2015-08-26 LAB — SAMPLE TO BLOOD BANK

## 2015-08-26 LAB — CBC
HCT: 38.7 % — ABNORMAL LOW (ref 39.0–52.0)
HEMATOCRIT: 38 % — AB (ref 39.0–52.0)
Hemoglobin: 12.4 g/dL — ABNORMAL LOW (ref 13.0–17.0)
Hemoglobin: 12.8 g/dL — ABNORMAL LOW (ref 13.0–17.0)
MCH: 26.7 pg (ref 26.0–34.0)
MCH: 27.9 pg (ref 26.0–34.0)
MCHC: 32 g/dL (ref 30.0–36.0)
MCHC: 33.7 g/dL (ref 30.0–36.0)
MCV: 83.2 fL (ref 78.0–100.0)
MCV: 83 fL (ref 78.0–100.0)
PLATELETS: 141 10*3/uL — AB (ref 150–400)
PLATELETS: 187 10*3/uL (ref 150–400)
RBC: 4.65 MIL/uL (ref 4.22–5.81)
RBC: 4.58 MIL/uL (ref 4.22–5.81)
RDW: 13.9 % (ref 11.5–15.5)
RDW: 13.8 % (ref 11.5–15.5)
WBC: 14.1 10*3/uL — ABNORMAL HIGH (ref 4.0–10.5)
WBC: 14.5 10*3/uL — ABNORMAL HIGH (ref 4.0–10.5)

## 2015-08-26 LAB — GLUCOSE, CAPILLARY
GLUCOSE-CAPILLARY: 228 mg/dL — AB (ref 65–99)
GLUCOSE-CAPILLARY: 208 mg/dL — AB (ref 65–99)
Glucose-Capillary: 322 mg/dL — ABNORMAL HIGH (ref 65–99)
Glucose-Capillary: 340 mg/dL — ABNORMAL HIGH (ref 65–99)

## 2015-08-26 LAB — CREATININE, SERUM
CREATININE: 0.83 mg/dL (ref 0.61–1.24)
GFR calc Af Amer: >60 mL/min (ref >60)
GFR calc non Af Amer: >60 mL/min (ref >60)

## 2015-08-26 LAB — RAPID HIV SCREEN (HIV 1/2 AB+AG)
HIV 1/2 ANTIBODIES: NONREACTIVE
HIV-1 P24 ANTIGEN - HIV24: NONREACTIVE

## 2015-08-26 LAB — CBG MONITORING, ED: Glucose-Capillary: 304 mg/dL — ABNORMAL HIGH (ref 65–99)

## 2015-08-26 MED ORDER — ONDANSETRON HCL 4 MG/2ML IJ SOLN
4.0000 mg | Freq: Four times a day (QID) | INTRAMUSCULAR | Status: DC | PRN
  Administered 2015-08-29 – 2015-08-30 (×3): 4 mg via INTRAVENOUS
  Filled 2015-08-26 (×3): qty 2

## 2015-08-26 MED ORDER — KCL IN DEXTROSE-NACL 20-5-0.45 MEQ/L-%-% IV SOLN
INTRAVENOUS | Status: DC
  Administered 2015-08-26 – 2015-08-28 (×2): via INTRAVENOUS
  Filled 2015-08-26 (×3): qty 1000

## 2015-08-26 MED ORDER — LORAZEPAM 1 MG PO TABS
1.0000 mg | ORAL_TABLET | Freq: Two times a day (BID) | ORAL | Status: DC | PRN
  Administered 2015-08-26 – 2015-08-31 (×6): 1 mg via ORAL
  Filled 2015-08-26 (×7): qty 1

## 2015-08-26 MED ORDER — ATORVASTATIN CALCIUM 80 MG PO TABS
80.0000 mg | ORAL_TABLET | Freq: Every day | ORAL | Status: DC
  Administered 2015-08-26 – 2015-08-31 (×6): 80 mg via ORAL
  Filled 2015-08-26 (×6): qty 1

## 2015-08-26 MED ORDER — INSULIN ASPART 100 UNIT/ML ~~LOC~~ SOLN
0.0000 [IU] | Freq: Every day | SUBCUTANEOUS | Status: DC
  Administered 2015-08-26: 4 [IU] via SUBCUTANEOUS
  Administered 2015-08-26 – 2015-08-27 (×2): 2 [IU] via SUBCUTANEOUS
  Administered 2015-08-28: 3 [IU] via SUBCUTANEOUS
  Filled 2015-08-26: qty 1

## 2015-08-26 MED ORDER — ENOXAPARIN SODIUM 40 MG/0.4ML ~~LOC~~ SOLN
40.0000 mg | SUBCUTANEOUS | Status: DC
  Administered 2015-08-26 – 2015-08-31 (×6): 40 mg via SUBCUTANEOUS
  Filled 2015-08-26 (×6): qty 0.4

## 2015-08-26 MED ORDER — FENTANYL CITRATE (PF) 100 MCG/2ML IJ SOLN
100.0000 ug | Freq: Once | INTRAMUSCULAR | Status: AC
  Administered 2015-08-26: 100 ug via INTRAVENOUS

## 2015-08-26 MED ORDER — PNEUMOCOCCAL VAC POLYVALENT 25 MCG/0.5ML IJ INJ
0.5000 mL | INJECTION | INTRAMUSCULAR | Status: AC
  Administered 2015-08-28: 0.5 mL via INTRAMUSCULAR
  Filled 2015-08-26: qty 0.5

## 2015-08-26 MED ORDER — KETOROLAC TROMETHAMINE 15 MG/ML IJ SOLN
15.0000 mg | Freq: Four times a day (QID) | INTRAMUSCULAR | Status: DC
  Administered 2015-08-26 – 2015-08-27 (×4): 15 mg via INTRAVENOUS
  Filled 2015-08-26 (×4): qty 1

## 2015-08-26 MED ORDER — OXYCODONE HCL 5 MG PO TABS
5.0000 mg | ORAL_TABLET | ORAL | Status: DC | PRN
  Administered 2015-08-26: 10 mg via ORAL
  Administered 2015-08-28 (×4): 15 mg via ORAL
  Administered 2015-08-29: 10 mg via ORAL
  Administered 2015-08-29 – 2015-08-30 (×2): 15 mg via ORAL
  Filled 2015-08-26: qty 2
  Filled 2015-08-26 (×7): qty 3
  Filled 2015-08-26: qty 2

## 2015-08-26 MED ORDER — ONDANSETRON HCL 4 MG PO TABS: 4.0000 mg | ORAL_TABLET | Freq: Four times a day (QID) | ORAL | Status: DC | PRN

## 2015-08-26 MED ORDER — METHOCARBAMOL 500 MG PO TABS
500.0000 mg | ORAL_TABLET | Freq: Three times a day (TID) | ORAL | Status: DC
  Administered 2015-08-26 – 2015-08-27 (×4): 500 mg via ORAL
  Filled 2015-08-26 (×4): qty 1

## 2015-08-26 MED ORDER — KETOROLAC TROMETHAMINE 30 MG/ML IJ SOLN
30.0000 mg | Freq: Once | INTRAMUSCULAR | Status: AC
  Administered 2015-08-26: 30 mg via INTRAVENOUS
  Filled 2015-08-26: qty 1

## 2015-08-26 MED ORDER — INSULIN ASPART 100 UNIT/ML ~~LOC~~ SOLN
0.0000 [IU] | Freq: Three times a day (TID) | SUBCUTANEOUS | Status: DC
  Administered 2015-08-26: 11 [IU] via SUBCUTANEOUS
  Administered 2015-08-26: 5 [IU] via SUBCUTANEOUS
  Administered 2015-08-26: 11 [IU] via SUBCUTANEOUS
  Administered 2015-08-27 (×2): 5 [IU] via SUBCUTANEOUS
  Administered 2015-08-27: 8 [IU] via SUBCUTANEOUS
  Administered 2015-08-28: 2 [IU] via SUBCUTANEOUS
  Administered 2015-08-28: 5 [IU] via SUBCUTANEOUS
  Administered 2015-08-29 (×3): 3 [IU] via SUBCUTANEOUS
  Administered 2015-08-30: 2 [IU] via SUBCUTANEOUS
  Administered 2015-08-30: 3 [IU] via SUBCUTANEOUS
  Administered 2015-08-30 – 2015-08-31 (×2): 2 [IU] via SUBCUTANEOUS

## 2015-08-26 MED ORDER — MECLIZINE HCL 25 MG PO TABS
50.0000 mg | ORAL_TABLET | Freq: Two times a day (BID) | ORAL | Status: DC | PRN
  Filled 2015-08-26: qty 2

## 2015-08-26 MED ORDER — HYDROMORPHONE HCL 1 MG/ML IJ SOLN
1.0000 mg | INTRAMUSCULAR | Status: DC | PRN
Start: 2015-08-26 — End: 2015-08-27
  Administered 2015-08-26: 2 mg via INTRAVENOUS
  Filled 2015-08-26: qty 2

## 2015-08-26 MED ORDER — FENTANYL CITRATE (PF) 100 MCG/2ML IJ SOLN
100.0000 ug | Freq: Once | INTRAMUSCULAR | Status: DC
  Filled 2015-08-26: qty 2

## 2015-08-26 MED ORDER — INSULIN GLARGINE 100 UNIT/ML ~~LOC~~ SOLN
15.0000 [IU] | Freq: Every day | SUBCUTANEOUS | Status: DC
  Administered 2015-08-26 (×2): 15 [IU] via SUBCUTANEOUS
  Filled 2015-08-26 (×3): qty 0.15

## 2015-08-26 MED ORDER — LIDOCAINE-EPINEPHRINE (PF) 2 %-1:200000 IJ SOLN
10.0000 mL | Freq: Once | INTRAMUSCULAR | Status: AC
  Administered 2015-08-26: 10 mL
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
## 2015-08-26 NOTE — Progress Notes (Signed)
Inpatient Diabetes Program Recommendations  AACE/ADA: New Consensus Statement on Inpatient Glycemic Control (2015)  Target Ranges:  Prepandial:   less than 140 mg/dL      Peak postprandial:   less than 180 mg/dL (1-2 hours)      Critically ill patients:  140 - 180 mg/dL  Results for Jeremy Medina, Jeremy Medina (MRN [id number]) as of 08/26/2015 11:09  Ref. Range 08/25/2015 21:34 08/26/2015 03:02 08/26/2015 06:39  Glucose-Capillary Latest Ref Range: 65-99 mg/dL 409431 (H) 811304 (H) 914322 (H)   Review of Glycemic Control  Diabetes history: DM2 Outpatient Diabetes medications: Lantus 30 units daily Current orders for Inpatient glycemic control: Lantus 15 units QHS, Novolog 0-15 units TID with meals, Novolog 0-5 units QHS  Inpatient Diabetes Program Recommendations: Insulin - Basal: Please consider increasing Lantus to 22 units QHS (based on 74 kg x 0.3 units).  Thanks, Jeremy PennerMarie Mazelle Huebert, RN, MSN, CDE Diabetes Coordinator Inpatient Diabetes Program [phone] (Team Pager from 8am to 5pm) [phone] ([facility] office) [phone] [facility] office) [phone] [facility] office)
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
## 2015-08-26 NOTE — ED Notes (Signed)
Pt restless, c/o R sided ribcage pain. Pt also removed IV; attempted twice to place second IV without success
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
## 2015-08-26 NOTE — ED Notes (Signed)
IV team at bedside 

## 2015-08-26 NOTE — Progress Notes (Signed)
Rehab Admissions Coordinator Note:  Patient was screened by Clois DupesBoyette, Joban Colledge Godwin for appropriateness for an Inpatient Acute Rehab Consult per OT recommendaiton.  At this time, we are recommending await further progress with therapy over the weekend to determine most appropriate rehab venue options. I will follow. Clois Dupes.  Helayna Dun Godwin 08/26/2015, 4:02 PM  I can be reached at [phone].

## 2015-08-26 NOTE — ED Notes (Signed)
IV attempted x 2 by second Rn without success

## 2015-08-27 ENCOUNTER — Inpatient Hospital Stay (HOSPITAL_COMMUNITY): Payer: Medicare Other

## 2015-08-27 DIAGNOSIS — S060XAA Concussion with loss of consciousness status unknown, initial encounter: Secondary | ICD-10-CM | POA: Diagnosis present

## 2015-08-27 DIAGNOSIS — S060X9A Concussion with loss of consciousness of unspecified duration, initial encounter: Secondary | ICD-10-CM | POA: Diagnosis present

## 2015-08-27 DIAGNOSIS — S82401A Unspecified fracture of shaft of right fibula, initial encounter for closed fracture: Secondary | ICD-10-CM | POA: Diagnosis present

## 2015-08-27 DIAGNOSIS — E119 Type 2 diabetes mellitus without complications: Secondary | ICD-10-CM

## 2015-08-27 LAB — GLUCOSE, CAPILLARY
GLUCOSE-CAPILLARY: 260 mg/dL — AB (ref 65–99)
Glucose-Capillary: 220 mg/dL — ABNORMAL HIGH (ref 65–99)
Glucose-Capillary: 206 mg/dL — ABNORMAL HIGH (ref 65–99)
Glucose-Capillary: 246 mg/dL — ABNORMAL HIGH (ref 65–99)

## 2015-08-27 MED ORDER — METHOCARBAMOL 750 MG PO TABS
1500.0000 mg | ORAL_TABLET | Freq: Four times a day (QID) | ORAL | Status: DC
  Administered 2015-08-27 – 2015-08-31 (×15): 1500 mg via ORAL
  Administered 2015-08-31: 750 mg via ORAL
  Filled 2015-08-27 (×16): qty 2

## 2015-08-27 MED ORDER — POLYETHYLENE GLYCOL 3350 17 G PO PACK
17.0000 g | PACK | Freq: Every day | ORAL | Status: DC
  Administered 2015-08-27 – 2015-08-31 (×5): 17 g via ORAL
  Filled 2015-08-27 (×5): qty 1

## 2015-08-27 MED ORDER — METHADONE HCL 10 MG PO TABS
5.0000 mg | ORAL_TABLET | Freq: Three times a day (TID) | ORAL | Status: DC
  Administered 2015-08-27 – 2015-08-29 (×6): 5 mg via ORAL
  Filled 2015-08-27 (×6): qty 1

## 2015-08-27 MED ORDER — HYDROMORPHONE HCL 1 MG/ML IJ SOLN
0.5000 mg | INTRAMUSCULAR | Status: DC | PRN
  Administered 2015-08-27 – 2015-08-28 (×3): 0.5 mg via INTRAVENOUS
  Administered 2015-08-28: 50 mg via INTRAVENOUS
  Administered 2015-08-29: 0.5 mg via INTRAVENOUS
  Filled 2015-08-27 (×6): qty 1

## 2015-08-27 MED ORDER — DOCUSATE SODIUM 100 MG PO CAPS
100.0000 mg | ORAL_CAPSULE | Freq: Two times a day (BID) | ORAL | Status: DC
  Administered 2015-08-27 – 2015-08-31 (×9): 100 mg via ORAL
  Filled 2015-08-27 (×10): qty 1

## 2015-08-27 MED ORDER — INSULIN GLARGINE 100 UNIT/ML ~~LOC~~ SOLN
30.0000 [IU] | Freq: Every day | SUBCUTANEOUS | Status: DC
  Administered 2015-08-27: 30 [IU] via SUBCUTANEOUS
  Filled 2015-08-27 (×2): qty 0.3

## 2015-08-27 MED ORDER — NAPROXEN 250 MG PO TABS
500.0000 mg | ORAL_TABLET | Freq: Two times a day (BID) | ORAL | Status: DC
  Administered 2015-08-27 – 2015-08-30 (×7): 500 mg via ORAL
  Administered 2015-08-31: 250 mg via ORAL
  Filled 2015-08-27 (×8): qty 2

## 2015-08-27 MED ORDER — METHADONE HCL 10 MG PO TABS: 10.0000 mg | ORAL_TABLET | Freq: Three times a day (TID) | ORAL | Status: DC

## 2015-08-27 MED ORDER — TRAMADOL HCL 50 MG PO TABS
100.0000 mg | ORAL_TABLET | Freq: Four times a day (QID) | ORAL | Status: DC
  Administered 2015-08-27 (×2): 100 mg via ORAL
  Administered 2015-08-28: 50 mg via ORAL
  Administered 2015-08-28 – 2015-08-31 (×15): 100 mg via ORAL
  Filled 2015-08-27 (×19): qty 2
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
## 2015-08-28 DIAGNOSIS — S0101XA Laceration without foreign body of scalp, initial encounter: Secondary | ICD-10-CM | POA: Diagnosis present

## 2015-08-28 DIAGNOSIS — S2243XA Multiple fractures of ribs, bilateral, initial encounter for closed fracture: Secondary | ICD-10-CM | POA: Diagnosis not present

## 2015-08-28 LAB — CBC
HEMATOCRIT: 39.1 % (ref 39.0–52.0)
HEMOGLOBIN: 12.6 g/dL — AB (ref 13.0–17.0)
MCH: 26.8 pg (ref 26.0–34.0)
MCHC: 32.2 g/dL (ref 30.0–36.0)
MCV: 83.2 fL (ref 78.0–100.0)
Platelets: 155 10*3/uL (ref 150–400)
RBC: 4.7 MIL/uL (ref 4.22–5.81)
RDW: 13.7 % (ref 11.5–15.5)
WBC: 9.5 10*3/uL (ref 4.0–10.5)

## 2015-08-28 LAB — GLUCOSE, CAPILLARY
GLUCOSE-CAPILLARY: 212 mg/dL — AB (ref 65–99)
Glucose-Capillary: 265 mg/dL — ABNORMAL HIGH (ref 65–99)
Glucose-Capillary: 265 mg/dL — ABNORMAL HIGH (ref 65–99)

## 2015-08-28 MED ORDER — INSULIN GLARGINE 100 UNIT/ML ~~LOC~~ SOLN
35.0000 [IU] | Freq: Every day | SUBCUTANEOUS | Status: DC
  Administered 2015-08-28: 35 [IU] via SUBCUTANEOUS
  Filled 2015-08-28 (×2): qty 0.35
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
## 2015-08-28 NOTE — Progress Notes (Signed)
Pt stated that his head was hurting and it was "off the charts".  Dilaudid, tramadol, robaxin given as ordered.  Within 15 minutes Patient stated that it was down to an 8.  He stated that it hurts in his ribs, back and head, with head throbbing. Informed him to give the medication a few more minutes to take affect and call if he needs anything before I get back.  Informed him I will be back to check.  Denies any further needs at this time.

## 2015-08-29 ENCOUNTER — Encounter (HOSPITAL_COMMUNITY): Payer: Self-pay | Admitting: Physical Medicine and Rehabilitation

## 2015-08-29 DIAGNOSIS — G894 Chronic pain syndrome: Secondary | ICD-10-CM

## 2015-08-29 DIAGNOSIS — F4323 Adjustment disorder with mixed anxiety and depressed mood: Secondary | ICD-10-CM | POA: Insufficient documentation

## 2015-08-29 DIAGNOSIS — S060X0D Concussion without loss of consciousness, subsequent encounter: Secondary | ICD-10-CM

## 2015-08-29 DIAGNOSIS — E119 Type 2 diabetes mellitus without complications: Secondary | ICD-10-CM

## 2015-08-29 DIAGNOSIS — D62 Acute posthemorrhagic anemia: Secondary | ICD-10-CM

## 2015-08-29 DIAGNOSIS — Z720 Tobacco use: Secondary | ICD-10-CM | POA: Insufficient documentation

## 2015-08-29 DIAGNOSIS — G43709 Chronic migraine without aura, not intractable, without status migrainosus: Secondary | ICD-10-CM | POA: Insufficient documentation

## 2015-08-29 DIAGNOSIS — S82401D Unspecified fracture of shaft of right fibula, subsequent encounter for closed fracture with routine healing: Secondary | ICD-10-CM

## 2015-08-29 DIAGNOSIS — M159 Polyosteoarthritis, unspecified: Secondary | ICD-10-CM

## 2015-08-29 DIAGNOSIS — S2242XA Multiple fractures of ribs, left side, initial encounter for closed fracture: Secondary | ICD-10-CM

## 2015-08-29 LAB — GLUCOSE, CAPILLARY
GLUCOSE-CAPILLARY: 180 mg/dL — AB (ref 65–99)
GLUCOSE-CAPILLARY: 193 mg/dL — AB (ref 65–99)
Glucose-Capillary: 161 mg/dL — ABNORMAL HIGH (ref 65–99)

## 2015-08-29 MED ORDER — METHADONE HCL 10 MG PO TABS
10.0000 mg | ORAL_TABLET | Freq: Three times a day (TID) | ORAL | Status: DC
  Administered 2015-08-29 – 2015-08-31 (×7): 10 mg via ORAL
  Filled 2015-08-29 (×7): qty 1

## 2015-08-29 MED ORDER — INSULIN GLARGINE 100 UNIT/ML ~~LOC~~ SOLN
40.0000 [IU] | Freq: Every day | SUBCUTANEOUS | Status: DC
  Administered 2015-08-30 (×2): 40 [IU] via SUBCUTANEOUS
  Filled 2015-08-29 (×3): qty 0.4
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
## 2015-08-29 NOTE — Care Management Important Message (Signed)
Important Message  Patient Details  Name: Jeremy Medina MRN: [id number] Date of Birth: 12/23/1950   Medicare Important Message Given:  Yes    Jeremy Medina 08/29/2015, 12:30 PM
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
## 2015-08-29 NOTE — Progress Notes (Signed)
SLP Cancellation Note  Patient Details Name: Jennell Cornerherman M Mooney MRN: [id number] DOB: 05/07/1951   Cancelled treatment:       Reason Eval/Treat Not Completed: Other (comment) Pt working with other providers. Will return as able.   Maxcine HamLaura Paiewonsky, M.A. CCC-SLP [phone]  Maxcine Hamaiewonsky, Matson Welch 08/29/2015, 10:29 AM

## 2015-08-29 NOTE — Progress Notes (Signed)
I met with pt at bedside to discuss his rehab venue options. Pt currently requiring a lot of pain meds and pain is limiting his ability to progress further with therapy. Pt asks is he can d/c directly home. I explained that unless he can progress with his pain management and demonstrate to mobilize well enough, he would have to be inpt rehab vs. SNF. I will follow up tomorrow with dispo. [phone]
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
## 2015-08-30 ENCOUNTER — Encounter (HOSPITAL_COMMUNITY): Payer: Self-pay | Admitting: General Practice

## 2015-08-30 LAB — GLUCOSE, CAPILLARY
GLUCOSE-CAPILLARY: 142 mg/dL — AB (ref 65–99)
GLUCOSE-CAPILLARY: 131 mg/dL — AB (ref 65–99)
GLUCOSE-CAPILLARY: 164 mg/dL — AB (ref 65–99)
Glucose-Capillary: 148 mg/dL — ABNORMAL HIGH (ref 65–99)
Glucose-Capillary: 129 mg/dL — ABNORMAL HIGH (ref 65–99)
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
## 2015-08-30 NOTE — Progress Notes (Signed)
I met with pt at bedside to discuss he rehab venue plans. He feels he has proven himself to be doing well enough to d/c home. He does not want to admit to any rehab facility . He questions if he will agree to any Home Health either. I referred his questions concerning his hospital bill coverage with the [place] and Medicare to his [employer] contacts. I have alerted RN CM and SW. We will sign off. [phone]

## 2015-08-31 LAB — GLUCOSE, CAPILLARY
GLUCOSE-CAPILLARY: 125 mg/dL — AB (ref 65–99)
Glucose-Capillary: 92 mg/dL (ref 65–99)

## 2015-08-31 MED ORDER — MAGNESIUM CITRATE PO SOLN
0.5000 | Freq: Once | ORAL | Status: AC
  Administered 2015-08-31: 0.5 via ORAL
  Filled 2015-08-31: qty 296

## 2015-08-31 MED ORDER — METHOCARBAMOL 750 MG PO TABS: 750.0000 mg | ORAL_TABLET | Freq: Three times a day (TID) | ORAL | Status: DC | PRN

## 2015-08-31 MED ORDER — NAPROXEN 500 MG PO TABS: 500.0000 mg | ORAL_TABLET | Freq: Two times a day (BID) | ORAL | Status: DC

## 2015-08-31 MED ORDER — OXYCODONE HCL 5 MG PO TABS: 5.0000 mg | ORAL_TABLET | Freq: Four times a day (QID) | ORAL | Status: DC | PRN

## 2015-08-31 MED ORDER — DOCUSATE SODIUM 100 MG PO CAPS: 100.0000 mg | ORAL_CAPSULE | Freq: Two times a day (BID) | ORAL | Status: DC

## 2015-08-31 MED ORDER — BISACODYL 10 MG RE SUPP: 10.0000 mg | Freq: Every day | RECTAL | Status: DC | PRN

## 2015-08-31 MED ORDER — TRAMADOL HCL 50 MG PO TABS: 100.0000 mg | ORAL_TABLET | Freq: Four times a day (QID) | ORAL | Status: DC | PRN

## 2015-08-31 MED ORDER — POLYETHYLENE GLYCOL 3350 17 G PO PACK: 17.0000 g | PACK | Freq: Every day | ORAL | Status: DC | PRN
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
## 2015-08-31 NOTE — Care Management Note (Signed)
Case Management Note  Patient Details  Name: Jeremy Medina MRN: [id number] Date of Birth: 05/15/1951  Subjective/Objective: Pt medically stable for dc home with son and HH follow up at home.                     Action/Plan: Met with pt to discuss [place] arrangements.  Pt chooses [facility] for [facility] needs; referral to [facility].  Start of care 24-48h post dc date.  Pt thinks he has a walker(s) at home; he is checking with his son.  Will follow up with pt prior to dc to assure that he has RW for home use.    Expected Discharge Date:    03/15/12017              Expected Discharge Plan:  [place]  In-House Referral:     Discharge planning Services  CM Consult  Post Acute Care Choice:  Home Health Choice offered to:  Patient  DME Arranged:    DME Agency:     HH Arranged:  PT, OT HH Agency:  [facility]  Status of Service:  Completed, signed off  Medicare Important Message Given:  Yes Date Medicare IM Given:    Medicare IM give by:    Date Additional Medicare IM Given:    Additional Medicare Important Message give by:     If discussed at [place] of Stay Meetings, dates discussed:    Additional Comments:  Reinaldo Raddle, RN, BSN  Trauma/Neuro ICU Case Manager [phone]
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
## 2015-08-31 NOTE — Progress Notes (Signed)
Reviewed discharge papers and medication pt fully understands.  Charlynn GrimesKim M Urban-Tucker, RN
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
## 2015-08-31 NOTE — Discharge Instructions (Signed)
Weight bearing as tolerated on the right leg.  Follow up with Dr. Lajoyce Cornersuda in 2 weeks.

## 2015-08-31 NOTE — Progress Notes (Signed)
Called Risk management spoke with Eunice BlaseDebbie, she asked me to do a safety zone portal on this situation.

## 2015-09-08 ENCOUNTER — Telehealth (HOSPITAL_COMMUNITY): Payer: Self-pay
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
## 2015-09-12 ENCOUNTER — Telehealth: Payer: Self-pay | Admitting: *Deleted

## 2015-09-12 NOTE — Telephone Encounter (Signed)
-----   Message from Dorna LeitzNicole Bush V, [place]C sent at 09/05/2015  8:23 AM EDT ----- Please call pt and make sure [facility]. I am going to remove myself as his PCP, since [facility] and I am leaving.

## 2015-09-14 NOTE — Telephone Encounter (Signed)
LM advising pt.  ?

## 2015-09-22 ENCOUNTER — Telehealth (HOSPITAL_COMMUNITY): Payer: Self-pay

## 2015-09-22 NOTE — Telephone Encounter (Signed)
Trying to get appt with Dr. Lajoyce Cornersuda, I gave him the correct phone #.

## 2015-10-11 ENCOUNTER — Ambulatory Visit (INDEPENDENT_AMBULATORY_CARE_PROVIDER_SITE_OTHER): Payer: Medicare Other | Admitting: Emergency Medicine

## 2015-10-11 ENCOUNTER — Encounter (HOSPITAL_COMMUNITY): Payer: Self-pay | Admitting: Emergency Medicine

## 2015-10-11 ENCOUNTER — Emergency Department (HOSPITAL_COMMUNITY)
Admission: EM | Admit: 2015-10-11 | Discharge: 2015-10-11 | Disposition: A | Discharge status: Home / Self Care | Payer: Medicare Other | Attending: Emergency Medicine | Admitting: Emergency Medicine

## 2015-10-11 ENCOUNTER — Emergency Department (HOSPITAL_COMMUNITY): Payer: Medicare Other

## 2015-10-11 VITALS — BP 202/130 | HR 87 | Temp 97.9°F | Resp 18

## 2015-10-11 DIAGNOSIS — M79605 Pain in left leg: Secondary | ICD-10-CM | POA: Diagnosis not present

## 2015-10-11 DIAGNOSIS — R079 Chest pain, unspecified: Secondary | ICD-10-CM | POA: Diagnosis not present

## 2015-10-11 DIAGNOSIS — I160 Hypertensive urgency: Secondary | ICD-10-CM | POA: Diagnosis not present

## 2015-10-11 DIAGNOSIS — F329 Major depressive disorder, single episode, unspecified: Secondary | ICD-10-CM | POA: Diagnosis not present

## 2015-10-11 DIAGNOSIS — R52 Pain, unspecified: Secondary | ICD-10-CM

## 2015-10-11 DIAGNOSIS — Z791 Long term (current) use of non-steroidal anti-inflammatories (NSAID): Secondary | ICD-10-CM | POA: Diagnosis not present

## 2015-10-11 DIAGNOSIS — R739 Hyperglycemia, unspecified: Secondary | ICD-10-CM | POA: Diagnosis not present

## 2015-10-11 DIAGNOSIS — E119 Type 2 diabetes mellitus without complications: Secondary | ICD-10-CM | POA: Diagnosis not present

## 2015-10-11 DIAGNOSIS — M791 Myalgia: Secondary | ICD-10-CM | POA: Diagnosis not present

## 2015-10-11 DIAGNOSIS — R531 Weakness: Secondary | ICD-10-CM | POA: Insufficient documentation

## 2015-10-11 DIAGNOSIS — Z79899 Other long term (current) drug therapy: Secondary | ICD-10-CM | POA: Insufficient documentation

## 2015-10-11 DIAGNOSIS — G8929 Other chronic pain: Secondary | ICD-10-CM | POA: Insufficient documentation

## 2015-10-11 DIAGNOSIS — E785 Hyperlipidemia, unspecified: Secondary | ICD-10-CM | POA: Insufficient documentation

## 2015-10-11 DIAGNOSIS — M1990 Unspecified osteoarthritis, unspecified site: Secondary | ICD-10-CM | POA: Diagnosis not present

## 2015-10-11 DIAGNOSIS — I10 Essential (primary) hypertension: Secondary | ICD-10-CM | POA: Diagnosis present

## 2015-10-11 DIAGNOSIS — M549 Dorsalgia, unspecified: Secondary | ICD-10-CM | POA: Diagnosis not present

## 2015-10-11 DIAGNOSIS — R109 Unspecified abdominal pain: Secondary | ICD-10-CM | POA: Diagnosis not present

## 2015-10-11 DIAGNOSIS — Z87891 Personal history of nicotine dependence: Secondary | ICD-10-CM | POA: Insufficient documentation

## 2015-10-11 DIAGNOSIS — Z87828 Personal history of other (healed) physical injury and trauma: Secondary | ICD-10-CM | POA: Insufficient documentation

## 2015-10-11 DIAGNOSIS — Z794 Long term (current) use of insulin: Secondary | ICD-10-CM | POA: Diagnosis not present

## 2015-10-11 DIAGNOSIS — M79604 Pain in right leg: Secondary | ICD-10-CM | POA: Diagnosis not present

## 2015-10-11 DIAGNOSIS — F419 Anxiety disorder, unspecified: Secondary | ICD-10-CM | POA: Diagnosis not present

## 2015-10-11 LAB — BASIC METABOLIC PANEL
ANION GAP: 10 (ref 5–15)
BUN: 16 mg/dL (ref 6–20)
CHLORIDE: 103 mmol/L (ref 101–111)
CO2: 24 mmol/L (ref 22–32)
CREATININE: 0.89 mg/dL (ref 0.61–1.24)
Calcium: 9.2 mg/dL (ref 8.9–10.3)
GFR calc non Af Amer: >60 mL/min (ref >60)
Glucose, Bld: 196 mg/dL — ABNORMAL HIGH (ref 65–99)
POTASSIUM: 3.7 mmol/L (ref 3.5–5.1)
SODIUM: 137 mmol/L (ref 135–145)

## 2015-10-11 LAB — CBC WITH DIFFERENTIAL/PLATELET
BASOS ABS: 0.1 10*3/uL (ref 0.0–0.1)
BASOS PCT: 0 %
EOS ABS: 0.2 10*3/uL (ref 0.0–0.7)
Eosinophils Relative: 1 %
HEMATOCRIT: 41.2 % (ref 39.0–52.0)
HEMOGLOBIN: 13.2 g/dL (ref 13.0–17.0)
Lymphocytes Relative: 26 %
Lymphs Abs: 4.2 10*3/uL — ABNORMAL HIGH (ref 0.7–4.0)
MCH: 27.2 pg (ref 26.0–34.0)
MCHC: 32 g/dL (ref 30.0–36.0)
MCV: 84.8 fL (ref 78.0–100.0)
MONOS PCT: 7 %
Monocytes Absolute: 1.1 10*3/uL — ABNORMAL HIGH (ref 0.1–1.0)
NEUTROS ABS: 10.7 10*3/uL — AB (ref 1.7–7.7)
NEUTROS PCT: 66 %
Platelets: 273 10*3/uL (ref 150–400)
RBC: 4.86 MIL/uL (ref 4.22–5.81)
RDW: 15.3 % (ref 11.5–15.5)
WBC: 16.3 10*3/uL — AB (ref 4.0–10.5)

## 2015-10-11 LAB — ETHANOL

## 2015-10-11 LAB — LIPASE, BLOOD: LIPASE: 26 U/L (ref 11–51)

## 2015-10-11 LAB — BRAIN NATRIURETIC PEPTIDE: B Natriuretic Peptide: 23.7 pg/mL (ref 0.0–100.0)

## 2015-10-11 LAB — GLUCOSE, POCT (MANUAL RESULT ENTRY): POC Glucose: 287 mg/dl — AB (ref 70–99)

## 2015-10-11 LAB — TROPONIN I

## 2015-10-11 MED ORDER — OXYCODONE HCL 5 MG PO TABS
5.0000 mg | ORAL_TABLET | Freq: Four times a day (QID) | ORAL | Status: DC | PRN
Start: 2015-10-11 — End: 2015-10-19

## 2015-10-11 MED ORDER — LABETALOL HCL 5 MG/ML IV SOLN
10.0000 mg | INTRAVENOUS | Status: DC | PRN
  Administered 2015-10-11: 10 mg via INTRAVENOUS
  Filled 2015-10-11: qty 4

## 2015-10-11 MED ORDER — METHADONE HCL 10 MG PO TABS
10.0000 mg | ORAL_TABLET | Freq: Once | ORAL | Status: AC
  Administered 2015-10-11: 10 mg via ORAL
  Filled 2015-10-11: qty 1

## 2015-10-11 NOTE — Patient Instructions (Signed)
     IF you received an x-ray today, you will receive an invoice from [facility]. Please contact [facility] at [phone] with questions or concerns regarding your invoice.   IF you received labwork today, you will receive an invoice from [facility]. Please contact [facility] at [phone] with questions or concerns regarding your invoice.   Our billing staff will not be able to assist you with questions regarding bills from these companies.  You will be contacted with the lab results as soon as they are available. The fastest way to get your results is to activate your My Chart account. Instructions are located on the last page of this paperwork. If you have not heard from us regarding the results in 2 weeks, please contact this office.      
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
## 2015-10-11 NOTE — Discharge Instructions (Signed)
As discussed, today's evaluation has been largely reassuring.  It is very important that you take all medication, specifically her blood pressure medication as directed, and keep your upcoming appointment with our general practitioner.  Return here for concerning changes in your condition.
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
## 2015-10-18 ENCOUNTER — Ambulatory Visit: Payer: Medicare Other | Admitting: Family Medicine

## 2015-10-19 ENCOUNTER — Ambulatory Visit (INDEPENDENT_AMBULATORY_CARE_PROVIDER_SITE_OTHER): Payer: Medicare Other | Admitting: Emergency Medicine

## 2015-10-19 VITALS — BP 136/84 | HR 106 | Temp 98.2°F | Resp 18 | Wt 206.0 lb

## 2015-10-19 DIAGNOSIS — I10 Essential (primary) hypertension: Secondary | ICD-10-CM | POA: Diagnosis not present

## 2015-10-19 DIAGNOSIS — S2232XG Fracture of one rib, left side, subsequent encounter for fracture with delayed healing: Secondary | ICD-10-CM | POA: Diagnosis not present

## 2015-10-19 DIAGNOSIS — E118 Type 2 diabetes mellitus with unspecified complications: Secondary | ICD-10-CM

## 2015-10-19 DIAGNOSIS — Z794 Long term (current) use of insulin: Secondary | ICD-10-CM

## 2015-10-19 DIAGNOSIS — G894 Chronic pain syndrome: Secondary | ICD-10-CM | POA: Diagnosis not present

## 2015-10-19 LAB — GLUCOSE, POCT (MANUAL RESULT ENTRY): POC GLUCOSE: 396 mg/dL — AB (ref 70–99)

## 2015-10-19 MED ORDER — OXYCODONE HCL 5 MG PO TABS: 5.0000 mg | ORAL_TABLET | Freq: Four times a day (QID) | ORAL | Status: DC | PRN
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
## 2015-10-19 NOTE — Patient Instructions (Addendum)
[facility] Policy for Prescribing Controlled Substances (Revised 04/2012) 1. Prescriptions for controlled substances will be filled by ONE provider at [facility] with whom you have established and developed a plan for your care, including follow-up. 2. You are encouraged to schedule an appointment with your prescriber at our appointment center for follow-up visits whenever possible. 3. If you request a prescription for the controlled substance while at [facility] for an acute problem (with someone other than your regular prescriber), you MAY be given a ONE-TIME prescription for a 30-day supply of the controlled substance, to allow time for you to return to see your regular prescriber for additional prescriptions. 00   IF you received an x-ray today, you will receive an invoice from [facility]. Please contact [facility] at [phone] with questions or concerns regarding your invoice.   IF you received labwork today, you will receive an invoice from [facility]. Please contact [facility] at [phone] with questions or concerns regarding your invoice.   Our billing staff will not be able to assist you with questions regarding bills from these companies.  You will be contacted with the lab results as soon as they are available. The fastest way to get your results is to activate your My Chart account. Instructions are located on the last page of this paperwork. If you have not heard from us regarding the results in 2 weeks, please contact this office.

## 2015-10-24 LAB — OXYCODONE, URINE (LC/MS-MS)
NOROXYCODONE, UR: 528 ng/mL — AB (ref <50)
OXYCODONE, UR: 129 ng/mL — AB (ref <50)

## 2015-10-25 ENCOUNTER — Ambulatory Visit (INDEPENDENT_AMBULATORY_CARE_PROVIDER_SITE_OTHER): Payer: Medicare Other | Admitting: Emergency Medicine

## 2015-10-25 VITALS — BP 126/84 | HR 84 | Temp 98.0°F | Resp 18 | Ht 70.5 in | Wt 210.0 lb

## 2015-10-25 DIAGNOSIS — S2231XG Fracture of one rib, right side, subsequent encounter for fracture with delayed healing: Secondary | ICD-10-CM

## 2015-10-25 LAB — PRESCRIPTION MONITORING PROFILE (9 PANEL)
Amphetamine/Meth: NEGATIVE ng/mL
BARBITURATE SCREEN, URINE: NEGATIVE ng/mL
Benzodiazepine Screen, Urine: NEGATIVE ng/mL
COCAINE METABOLITES: NEGATIVE ng/mL
Cannabinoid Scrn, Ur: NEGATIVE ng/mL
Creatinine, Urine: 62.08 mg/dL (ref >20.0)
METHADONE SCREEN, URINE: NEGATIVE ng/mL
Nitrites, Initial: NEGATIVE ug/mL
OPIATE SCREEN, URINE: NEGATIVE ng/mL
PH URINE, INITIAL: 5.7 pH (ref 4.5–8.9)
Propoxyphene: NEGATIVE ng/mL

## 2015-10-25 MED ORDER — OXYCODONE HCL 5 MG PO TABS: ORAL_TABLET | ORAL | Status: DC

## 2015-10-25 NOTE — Patient Instructions (Addendum)
You have been a medication to last year for 1 week. Recheck next Tuesday morning.    IF you received an x-ray today, you will receive an invoice from [facility]. Please contact [facility] at [phone] with questions or concerns regarding your invoice.   IF you received labwork today, you will receive an invoice from [facility]. Please contact [facility] at [phone] with questions or concerns regarding your invoice.   Our billing staff will not be able to assist you with questions regarding bills from these companies.  You will be contacted with the lab results as soon as they are available. The fastest way to get your results is to activate your My Chart account. Instructions are located on the last page of this paperwork. If you have not heard from us regarding the results in 2 weeks, please contact this office.
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
## 2016-02-25 DIAGNOSIS — D500 Iron deficiency anemia secondary to blood loss (chronic): Secondary | ICD-10-CM | POA: Insufficient documentation

## 2017-06-28 DIAGNOSIS — G8929 Other chronic pain: Secondary | ICD-10-CM | POA: Diagnosis not present

## 2017-06-28 DIAGNOSIS — M5137 Other intervertebral disc degeneration, lumbosacral region: Secondary | ICD-10-CM | POA: Diagnosis not present

## 2017-06-28 DIAGNOSIS — Z79899 Other long term (current) drug therapy: Secondary | ICD-10-CM | POA: Diagnosis not present

## 2017-06-28 DIAGNOSIS — S2249XA Multiple fractures of ribs, unspecified side, initial encounter for closed fracture: Secondary | ICD-10-CM | POA: Diagnosis not present

## 2017-06-28 DIAGNOSIS — M545 Low back pain: Secondary | ICD-10-CM | POA: Diagnosis not present

## 2017-07-05 DIAGNOSIS — Z79899 Other long term (current) drug therapy: Secondary | ICD-10-CM | POA: Diagnosis not present

## 2017-07-05 DIAGNOSIS — M5137 Other intervertebral disc degeneration, lumbosacral region: Secondary | ICD-10-CM | POA: Diagnosis not present

## 2017-07-05 DIAGNOSIS — M419 Scoliosis, unspecified: Secondary | ICD-10-CM | POA: Diagnosis not present

## 2017-07-05 DIAGNOSIS — M47817 Spondylosis without myelopathy or radiculopathy, lumbosacral region: Secondary | ICD-10-CM | POA: Diagnosis not present

## 2017-08-02 DIAGNOSIS — G8929 Other chronic pain: Secondary | ICD-10-CM | POA: Diagnosis not present

## 2017-08-02 DIAGNOSIS — Z79899 Other long term (current) drug therapy: Secondary | ICD-10-CM | POA: Diagnosis not present

## 2017-08-02 DIAGNOSIS — M545 Low back pain: Secondary | ICD-10-CM | POA: Diagnosis not present

## 2017-08-02 DIAGNOSIS — M5137 Other intervertebral disc degeneration, lumbosacral region: Secondary | ICD-10-CM | POA: Diagnosis not present

## 2017-08-02 DIAGNOSIS — M47817 Spondylosis without myelopathy or radiculopathy, lumbosacral region: Secondary | ICD-10-CM | POA: Diagnosis not present
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
## 2017-08-28 DIAGNOSIS — M47817 Spondylosis without myelopathy or radiculopathy, lumbosacral region: Secondary | ICD-10-CM | POA: Diagnosis not present

## 2017-08-28 DIAGNOSIS — Z79899 Other long term (current) drug therapy: Secondary | ICD-10-CM | POA: Diagnosis not present

## 2017-08-28 DIAGNOSIS — M5137 Other intervertebral disc degeneration, lumbosacral region: Secondary | ICD-10-CM | POA: Diagnosis not present

## 2017-08-28 DIAGNOSIS — M1250 Traumatic arthropathy, unspecified site: Secondary | ICD-10-CM | POA: Diagnosis not present

## 2017-08-28 DIAGNOSIS — S2249XA Multiple fractures of ribs, unspecified side, initial encounter for closed fracture: Secondary | ICD-10-CM | POA: Diagnosis not present

## 2017-09-21 DIAGNOSIS — S2249XA Multiple fractures of ribs, unspecified side, initial encounter for closed fracture: Secondary | ICD-10-CM | POA: Diagnosis not present

## 2017-09-21 DIAGNOSIS — M1250 Traumatic arthropathy, unspecified site: Secondary | ICD-10-CM | POA: Diagnosis not present

## 2017-09-21 DIAGNOSIS — Z79899 Other long term (current) drug therapy: Secondary | ICD-10-CM | POA: Diagnosis not present

## 2017-09-21 DIAGNOSIS — M47817 Spondylosis without myelopathy or radiculopathy, lumbosacral region: Secondary | ICD-10-CM | POA: Diagnosis not present

## 2017-09-21 DIAGNOSIS — M5137 Other intervertebral disc degeneration, lumbosacral region: Secondary | ICD-10-CM | POA: Diagnosis not present

## 2017-09-21 IMAGING — DX DG CHEST 1V PORT
1 series · 1 of 1 positions shown · non-contrast
Comparison: August 27, 2015.

CLINICAL DATA: Chest pain.

EXAM:
PORTABLE CHEST 1 VIEW

[chest ap]
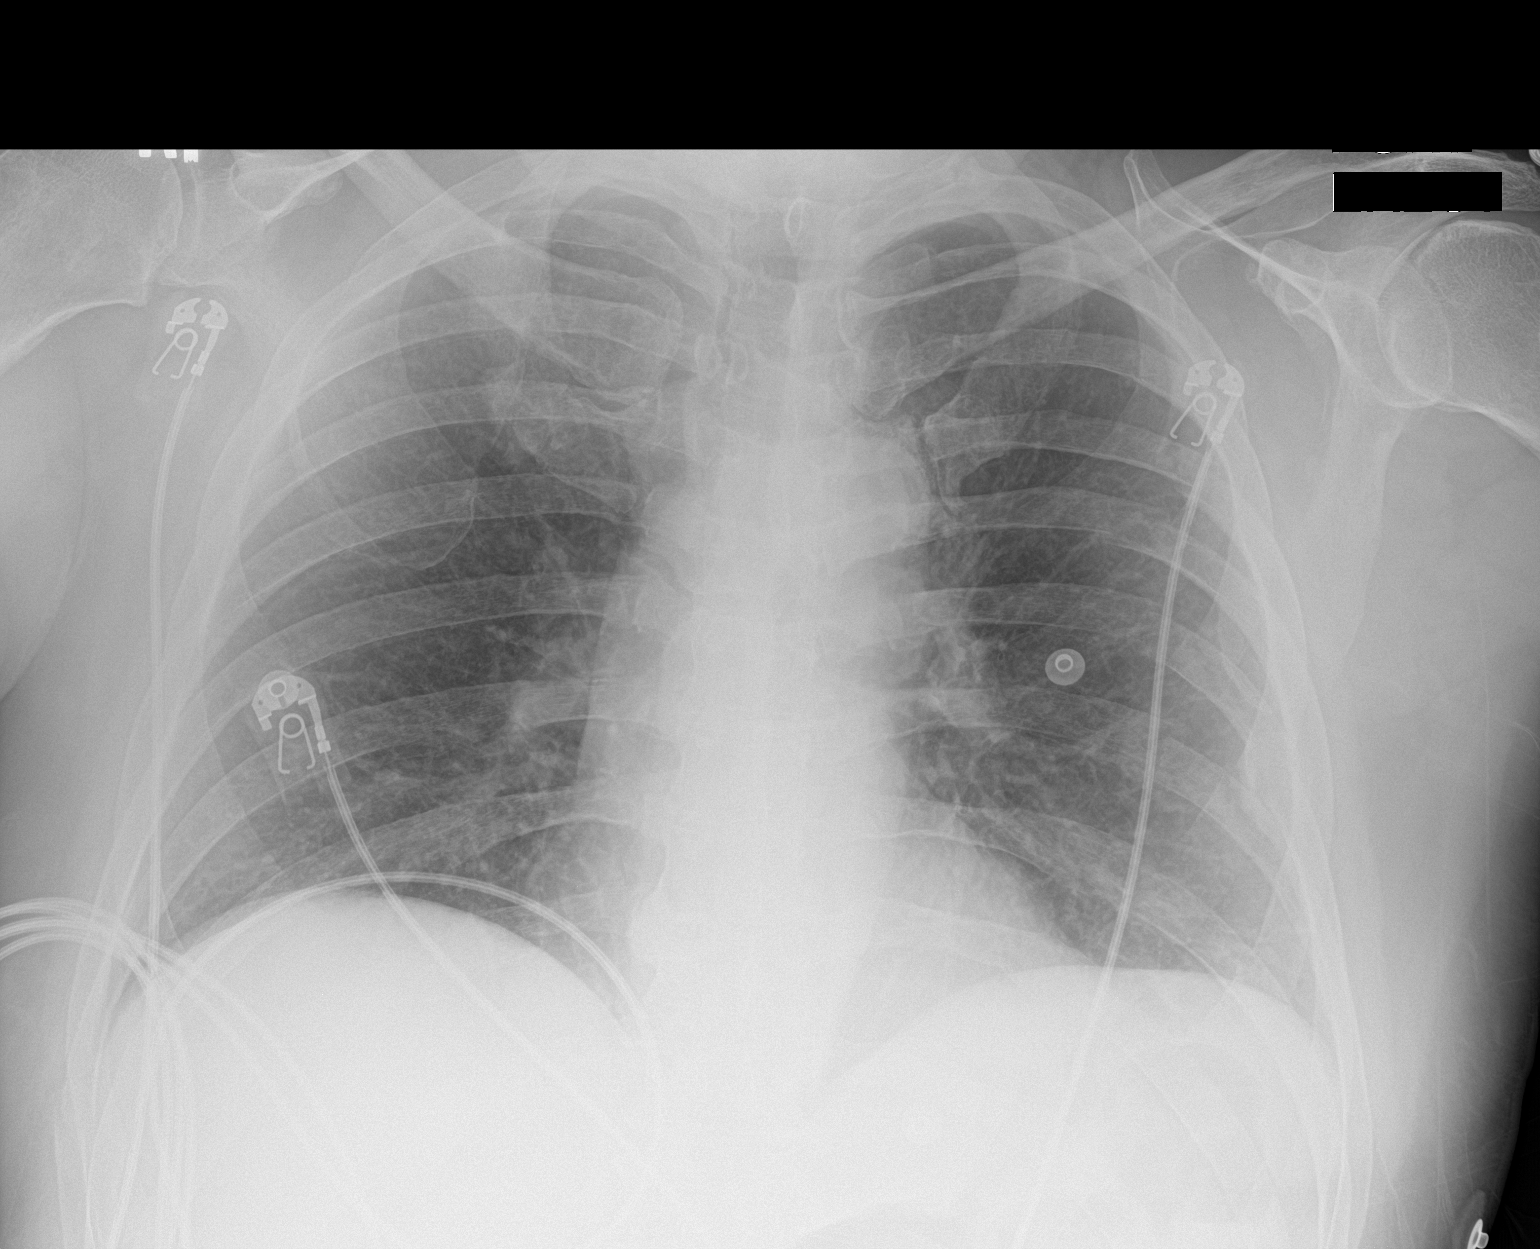

[1 of 1 positions shown; findings below may reference images not displayed]

FINDINGS: The heart size and mediastinal contours are within normal limits.
Both lungs are clear. No pneumothorax or pleural effusion is noted.
Several old left rib fractures are noted. Mildly displaced fractures
are seen involving the posterior portions of the left fifth, sixth,
seventh, eighth and ninth ribs are noted which were not present on
prior exam, and therefore of indeterminate age.
IMPRESSION: Multiple left rib fractures are noted of indeterminate age. No
pneumothorax or pleural effusion is noted.

## 2017-11-19 DIAGNOSIS — G894 Chronic pain syndrome: Secondary | ICD-10-CM | POA: Diagnosis not present

## 2017-11-19 DIAGNOSIS — M961 Postlaminectomy syndrome, not elsewhere classified: Secondary | ICD-10-CM | POA: Diagnosis not present

## 2017-11-19 DIAGNOSIS — M545 Low back pain: Secondary | ICD-10-CM | POA: Diagnosis not present

## 2017-11-19 DIAGNOSIS — Z79891 Long term (current) use of opiate analgesic: Secondary | ICD-10-CM | POA: Diagnosis not present

## 2017-11-27 ENCOUNTER — Telehealth: Payer: Self-pay

## 2017-11-27 NOTE — Telephone Encounter (Signed)
Copied from CRM [id number]. Topic: Referral - Request >> Nov 26, 2017  4:13 PM Raquel SarnaHayes, Teresa G wrote: Pt needing referrals to:  [facility] - fax [phone] EKG - referral >> Nov 26, 2017  5:39 PM Pollyann KennedyHinson, Shannon R wrote: This patient has not been seen here since 2017 and would need an OV.

## 2017-12-05 ENCOUNTER — Encounter: Payer: Self-pay | Admitting: Physician Assistant

## 2017-12-05 ENCOUNTER — Other Ambulatory Visit: Payer: Self-pay

## 2017-12-05 ENCOUNTER — Ambulatory Visit (INDEPENDENT_AMBULATORY_CARE_PROVIDER_SITE_OTHER): Payer: Medicare Other

## 2017-12-05 ENCOUNTER — Ambulatory Visit (INDEPENDENT_AMBULATORY_CARE_PROVIDER_SITE_OTHER): Payer: Medicare Other | Admitting: Physician Assistant

## 2017-12-05 VITALS — BP 132/74 | HR 79 | Temp 99.0°F | Resp 18 | Ht 70.5 in | Wt 221.6 lb

## 2017-12-05 DIAGNOSIS — E7800 Pure hypercholesterolemia, unspecified: Secondary | ICD-10-CM | POA: Diagnosis not present

## 2017-12-05 DIAGNOSIS — Z794 Long term (current) use of insulin: Secondary | ICD-10-CM

## 2017-12-05 DIAGNOSIS — Z8781 Personal history of (healed) traumatic fracture: Secondary | ICD-10-CM

## 2017-12-05 DIAGNOSIS — E1142 Type 2 diabetes mellitus with diabetic polyneuropathy: Secondary | ICD-10-CM | POA: Diagnosis not present

## 2017-12-05 DIAGNOSIS — Z720 Tobacco use: Secondary | ICD-10-CM | POA: Diagnosis not present

## 2017-12-05 DIAGNOSIS — F4310 Post-traumatic stress disorder, unspecified: Secondary | ICD-10-CM | POA: Diagnosis not present

## 2017-12-05 DIAGNOSIS — G894 Chronic pain syndrome: Secondary | ICD-10-CM | POA: Diagnosis not present

## 2017-12-05 DIAGNOSIS — R0781 Pleurodynia: Secondary | ICD-10-CM | POA: Diagnosis not present
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
## 2017-12-05 NOTE — Patient Instructions (Addendum)
Pain management -  [phone] - fax - we will fax results and info to him    IF you received an x-ray today, you will receive an invoice from [facility]. Please contact [facility] at [phone] with questions or concerns regarding your invoice.   IF you received labwork today, you will receive an invoice from [facility]. Please contact [facility] at [phone] with questions or concerns regarding your invoice.   Our billing staff will not be able to assist you with questions regarding bills from these companies.  You will be contacted with the lab results as soon as they are available. The fastest way to get your results is to activate your My Chart account. Instructions are located on the last page of this paperwork. If you have not heard from us regarding the results in 2 weeks, please contact this office.

## 2017-12-11 DIAGNOSIS — M545 Low back pain: Secondary | ICD-10-CM | POA: Diagnosis not present

## 2017-12-11 DIAGNOSIS — Z79891 Long term (current) use of opiate analgesic: Secondary | ICD-10-CM | POA: Diagnosis not present

## 2017-12-11 DIAGNOSIS — G894 Chronic pain syndrome: Secondary | ICD-10-CM | POA: Diagnosis not present

## 2017-12-11 DIAGNOSIS — M961 Postlaminectomy syndrome, not elsewhere classified: Secondary | ICD-10-CM | POA: Diagnosis not present

## 2017-12-25 DIAGNOSIS — F4310 Post-traumatic stress disorder, unspecified: Secondary | ICD-10-CM | POA: Diagnosis not present

## 2018-01-01 DIAGNOSIS — F4310 Post-traumatic stress disorder, unspecified: Secondary | ICD-10-CM | POA: Diagnosis not present

## 2018-01-15 DIAGNOSIS — Z79891 Long term (current) use of opiate analgesic: Secondary | ICD-10-CM | POA: Diagnosis not present

## 2018-01-15 DIAGNOSIS — M545 Low back pain: Secondary | ICD-10-CM | POA: Diagnosis not present

## 2018-01-15 DIAGNOSIS — M961 Postlaminectomy syndrome, not elsewhere classified: Secondary | ICD-10-CM | POA: Diagnosis not present

## 2018-01-15 DIAGNOSIS — G894 Chronic pain syndrome: Secondary | ICD-10-CM | POA: Diagnosis not present

## 2018-02-12 DIAGNOSIS — G894 Chronic pain syndrome: Secondary | ICD-10-CM | POA: Diagnosis not present

## 2018-02-12 DIAGNOSIS — Z79891 Long term (current) use of opiate analgesic: Secondary | ICD-10-CM | POA: Diagnosis not present

## 2018-02-12 DIAGNOSIS — M961 Postlaminectomy syndrome, not elsewhere classified: Secondary | ICD-10-CM | POA: Diagnosis not present

## 2018-02-12 DIAGNOSIS — M545 Low back pain: Secondary | ICD-10-CM | POA: Diagnosis not present

## 2018-03-12 DIAGNOSIS — M545 Low back pain: Secondary | ICD-10-CM | POA: Diagnosis not present

## 2018-03-12 DIAGNOSIS — M961 Postlaminectomy syndrome, not elsewhere classified: Secondary | ICD-10-CM | POA: Diagnosis not present

## 2018-03-12 DIAGNOSIS — G894 Chronic pain syndrome: Secondary | ICD-10-CM | POA: Diagnosis not present

## 2018-03-12 DIAGNOSIS — Z79891 Long term (current) use of opiate analgesic: Secondary | ICD-10-CM | POA: Diagnosis not present

## 2018-04-09 DIAGNOSIS — M961 Postlaminectomy syndrome, not elsewhere classified: Secondary | ICD-10-CM | POA: Diagnosis not present

## 2018-04-09 DIAGNOSIS — G894 Chronic pain syndrome: Secondary | ICD-10-CM | POA: Diagnosis not present

## 2018-04-09 DIAGNOSIS — M545 Low back pain: Secondary | ICD-10-CM | POA: Diagnosis not present

## 2018-04-09 DIAGNOSIS — Z79891 Long term (current) use of opiate analgesic: Secondary | ICD-10-CM | POA: Diagnosis not present

## 2018-04-15 MED FILL — METHADONE HCL 10 MG TABLET: 10 | 23 days supply | Qty: 115 | Fill #0

## 2018-05-07 DIAGNOSIS — M545 Low back pain: Secondary | ICD-10-CM | POA: Diagnosis not present

## 2018-05-07 DIAGNOSIS — M961 Postlaminectomy syndrome, not elsewhere classified: Secondary | ICD-10-CM | POA: Diagnosis not present

## 2018-05-07 DIAGNOSIS — Z79891 Long term (current) use of opiate analgesic: Secondary | ICD-10-CM | POA: Diagnosis not present

## 2018-05-07 DIAGNOSIS — G894 Chronic pain syndrome: Secondary | ICD-10-CM | POA: Diagnosis not present

## 2018-05-07 MED FILL — METHADONE HCL 10 MG TABLET: 10 | 30 days supply | Qty: 150 | Fill #0

## 2018-06-04 DIAGNOSIS — M961 Postlaminectomy syndrome, not elsewhere classified: Secondary | ICD-10-CM | POA: Diagnosis not present

## 2018-06-04 DIAGNOSIS — G894 Chronic pain syndrome: Secondary | ICD-10-CM | POA: Diagnosis not present

## 2018-06-04 DIAGNOSIS — M545 Low back pain: Secondary | ICD-10-CM | POA: Diagnosis not present

## 2018-06-04 DIAGNOSIS — Z79891 Long term (current) use of opiate analgesic: Secondary | ICD-10-CM | POA: Diagnosis not present

## 2018-06-04 MED FILL — predniSONE 10 MG TABS: 10 | 6 days supply | Qty: 21 | Fill #0

## 2018-06-04 MED FILL — METHADONE HCL 10 MG TABLET: 10 | 30 days supply | Qty: 150 | Fill #0

## 2018-07-02 MED FILL — METHADONE HCL 10 MG TABS: 10 | 3 days supply | Qty: 15 | Fill #0

## 2018-12-12 ENCOUNTER — Telehealth: Payer: Self-pay | Admitting: Registered Nurse

## 2018-12-12 NOTE — Telephone Encounter (Signed)
Copied from [place] [id number]. Topic: Appointment Scheduling - Scheduling Inquiry for Clinic >> Dec 11, 2018  5:26 PM Nils Flack wrote: Reason for CRM: pt would like appt for diabetes follow up  Call [phone]

## 2018-12-22 ENCOUNTER — Encounter: Payer: Medicare Other | Admitting: Registered Nurse

## 2019-04-27 ENCOUNTER — Other Ambulatory Visit: Payer: Self-pay

## 2019-04-27 ENCOUNTER — Ambulatory Visit (INDEPENDENT_AMBULATORY_CARE_PROVIDER_SITE_OTHER): Payer: Medicare Other | Admitting: Registered Nurse

## 2019-04-27 ENCOUNTER — Encounter: Payer: Self-pay | Admitting: Registered Nurse

## 2019-04-27 VITALS — BP 128/79 | HR 60 | Temp 98.0°F | Resp 16 | Wt 237.0 lb

## 2019-04-27 DIAGNOSIS — R609 Edema, unspecified: Secondary | ICD-10-CM | POA: Diagnosis not present

## 2019-04-27 DIAGNOSIS — L03119 Cellulitis of unspecified part of limb: Secondary | ICD-10-CM

## 2019-04-27 LAB — POCT URINALYSIS DIP (MANUAL ENTRY)
Bilirubin, UA: NEGATIVE
Glucose, UA: NEGATIVE mg/dL
Ketones, POC UA: NEGATIVE mg/dL
Leukocytes, UA: NEGATIVE
Nitrite, UA: NEGATIVE
Protein Ur, POC: NEGATIVE mg/dL
Spec Grav, UA: 1.02 (ref 1.010–1.025)
Urobilinogen, UA: 0.2 E.U./dL
pH, UA: 6.5 (ref 5.0–8.0)

## 2019-04-27 MED ORDER — CEPHALEXIN 500 MG PO CAPS: 500.0000 mg | ORAL_CAPSULE | Freq: Four times a day (QID) | ORAL | 0 refills | Status: DC

## 2019-04-27 NOTE — Patient Instructions (Signed)
° ° ° °  If you have lab work done today you will be contacted with your lab results within the next 2 weeks.  If you have not heard from us then please contact us. The fastest way to get your results is to register for My Chart. ° ° °IF you received an x-ray today, you will receive an invoice from [facility]. Please contact [facility] at [phone] with questions or concerns regarding your invoice.  ° °IF you received labwork today, you will receive an invoice from [facility]. Please contact [facility] at [phone] with questions or concerns regarding your invoice.  ° °Our billing staff will not be able to assist you with questions regarding bills from these companies. ° °You will be contacted with the lab results as soon as they are available. The fastest way to get your results is to activate your My Chart account. Instructions are located on the last page of this paperwork. If you have not heard from us regarding the results in 2 weeks, please contact this office. °  ° ° ° °

## 2019-05-05 ENCOUNTER — Encounter: Payer: Self-pay | Admitting: Registered Nurse

## 2019-05-05 DIAGNOSIS — L03119 Cellulitis of unspecified part of limb: Secondary | ICD-10-CM | POA: Insufficient documentation
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
## 2019-05-28 ENCOUNTER — Other Ambulatory Visit: Payer: Medicare Other

## 2019-06-09 ENCOUNTER — Ambulatory Visit: Payer: Medicare Other | Attending: Internal Medicine

## 2019-06-09 DIAGNOSIS — Z20822 Contact with and (suspected) exposure to covid-19: Secondary | ICD-10-CM

## 2019-06-11 ENCOUNTER — Telehealth: Payer: Self-pay | Admitting: Internal Medicine

## 2019-06-11 LAB — NOVEL CORONAVIRUS, NAA: SARS-CoV-2, NAA: NOT DETECTED

## 2019-06-11 NOTE — Telephone Encounter (Signed)
Pt aware covid lab test negative, not detected °
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
## 2022-08-01 ENCOUNTER — Emergency Department (HOSPITAL_COMMUNITY): Payer: No Typology Code available for payment source

## 2022-08-01 ENCOUNTER — Encounter (HOSPITAL_COMMUNITY): Payer: Self-pay | Admitting: Emergency Medicine

## 2022-08-01 ENCOUNTER — Other Ambulatory Visit: Payer: Self-pay

## 2022-08-01 ENCOUNTER — Emergency Department (HOSPITAL_COMMUNITY)
Admission: EM | Admit: 2022-08-01 | Discharge: 2022-08-02 | Discharge status: Against Medical Advice | Payer: No Typology Code available for payment source | Attending: Emergency Medicine | Admitting: Emergency Medicine

## 2022-08-01 DIAGNOSIS — Z5329 Procedure and treatment not carried out because of patient's decision for other reasons: Secondary | ICD-10-CM | POA: Insufficient documentation

## 2022-08-01 DIAGNOSIS — N179 Acute kidney failure, unspecified: Secondary | ICD-10-CM | POA: Diagnosis not present

## 2022-08-01 DIAGNOSIS — R112 Nausea with vomiting, unspecified: Secondary | ICD-10-CM | POA: Insufficient documentation

## 2022-08-01 DIAGNOSIS — D72829 Elevated white blood cell count, unspecified: Secondary | ICD-10-CM | POA: Insufficient documentation

## 2022-08-01 DIAGNOSIS — M545 Low back pain, unspecified: Secondary | ICD-10-CM | POA: Insufficient documentation

## 2022-08-01 DIAGNOSIS — Z20822 Contact with and (suspected) exposure to covid-19: Secondary | ICD-10-CM | POA: Insufficient documentation

## 2022-08-01 DIAGNOSIS — R1030 Lower abdominal pain, unspecified: Secondary | ICD-10-CM | POA: Diagnosis present

## 2022-08-01 DIAGNOSIS — K5900 Constipation, unspecified: Secondary | ICD-10-CM | POA: Diagnosis not present

## 2022-08-01 DIAGNOSIS — R739 Hyperglycemia, unspecified: Secondary | ICD-10-CM | POA: Insufficient documentation

## 2022-08-01 DIAGNOSIS — R519 Headache, unspecified: Secondary | ICD-10-CM | POA: Diagnosis not present

## 2022-08-01 DIAGNOSIS — E860 Dehydration: Secondary | ICD-10-CM | POA: Diagnosis not present

## 2022-08-01 LAB — CBC WITH DIFFERENTIAL/PLATELET
Abs Immature Granulocytes: 0.04 10*3/uL (ref 0.00–0.07)
Basophils Absolute: 0.1 10*3/uL (ref 0.0–0.1)
Basophils Relative: 1 %
Eosinophils Absolute: 0.1 10*3/uL (ref 0.0–0.5)
Eosinophils Relative: 1 %
HCT: 46.4 % (ref 39.0–52.0)
Hemoglobin: 15 g/dL (ref 13.0–17.0)
Immature Granulocytes: 0 %
Lymphocytes Relative: 24 %
Lymphs Abs: 3.2 10*3/uL (ref 0.7–4.0)
MCH: 27.7 pg (ref 26.0–34.0)
MCHC: 32.3 g/dL (ref 30.0–36.0)
MCV: 85.6 fL (ref 80.0–100.0)
Monocytes Absolute: 1 10*3/uL (ref 0.1–1.0)
Monocytes Relative: 8 %
Neutro Abs: 8.8 10*3/uL — ABNORMAL HIGH (ref 1.7–7.7)
Neutrophils Relative %: 66 %
Platelets: 230 10*3/uL (ref 150–400)
RBC: 5.42 MIL/uL (ref 4.22–5.81)
RDW: 14.6 % (ref 11.5–15.5)
WBC: 13.2 10*3/uL — ABNORMAL HIGH (ref 4.0–10.5)
nRBC: 0 % (ref 0.0–0.2)

## 2022-08-01 LAB — RESP PANEL BY RT-PCR (RSV, FLU A&B, COVID)  RVPGX2
Influenza A by PCR: NEGATIVE
Influenza B by PCR: NEGATIVE
Resp Syncytial Virus by PCR: NEGATIVE
SARS Coronavirus 2 by RT PCR: NEGATIVE

## 2022-08-01 MED ORDER — ONDANSETRON 4 MG PO TBDP: 4.0000 mg | ORAL_TABLET | Freq: Once | ORAL | Status: DC

## 2022-08-01 MED ORDER — SODIUM CHLORIDE 0.9 % IV BOLUS
1000.0000 mL | Freq: Once | INTRAVENOUS | Status: AC
  Administered 2022-08-02: 1000 mL via INTRAVENOUS

## 2022-08-01 MED ORDER — MORPHINE SULFATE (PF) 4 MG/ML IV SOLN
4.0000 mg | Freq: Once | INTRAVENOUS | Status: AC
  Administered 2022-08-02: 4 mg via INTRAVENOUS
  Filled 2022-08-01: qty 1

## 2022-08-01 MED ORDER — ONDANSETRON HCL 4 MG/2ML IJ SOLN
4.0000 mg | Freq: Once | INTRAMUSCULAR | Status: AC
  Administered 2022-08-02: 4 mg via INTRAVENOUS
  Filled 2022-08-01: qty 2

## 2022-08-01 NOTE — ED Provider Notes (Signed)
[facility] Provider Note   CSN: [id number] Arrival date & time: 08/01/22  1811     History {Add pertinent medical, surgical, social history, OB history to HPI:1} Chief Complaint  Patient presents with   Abdominal Pain   Emesis    Jeremy Medina. is a 72 y.o. male.  Patient with chronic back pain on methadone here with 2 weeks of nausea, vomiting and lower abdominal pain.  States not able to tolerate anything by mouth and vomiting 6-7 times daily for the past 2 weeks.  Emesis is nonbloody and nonbilious.  No diarrhea.  Has been constipated.  Has lower abdominal pain which is progressively worsening.  No fevers, chills, pain with urination or blood in the urine.  No travel or sick contacts.  Still has appendix and gallbladder.  Has not had his methadone for least 1 week since he has been sick and his back is hurting more than usual.  No new numbness, tingling or weakness.  No bowel or bladder incontinence. No chest pain or shortness of breath.  The history is provided by the patient.  Abdominal Pain Associated symptoms: constipation, nausea and vomiting   Associated symptoms: no cough, no dysuria, no fatigue, no fever, no hematuria and no shortness of breath   Emesis Associated symptoms: abdominal pain   Associated symptoms: no arthralgias, no cough, no fever and no headaches        Home Medications Prior to Admission medications   Medication Sig Start Date End Date Taking? Authorizing Provider  bisacodyl (DULCOLAX) 10 MG suppository Place 1 suppository (10 mg total) rectally daily as needed for mild constipation or moderate constipation. 08/31/15   Nat Christen, PA-C  cephALEXin (KEFLEX) 500 MG capsule Take 1 capsule (500 mg total) by mouth 4 (four) times daily. 04/27/19   Maximiano Coss, NP  docusate sodium (COLACE) 100 MG capsule Take 1 capsule (100 mg total) by mouth 2 (two) times daily. 08/31/15   Nat Christen, PA-C   insulin aspart (NOVOLOG) 100 UNIT/ML injection Inject 10 Units into the skin 3 (three) times daily before meals.    [provider]  insulin glargine (LANTUS) 100 UNIT/ML injection Inject 30 Units into the skin daily.    [provider]  naproxen (NAPROSYN) 500 MG tablet Take 1 tablet (500 mg total) by mouth 2 (two) times daily with a meal. 08/31/15   Nat Christen, PA-C  rosuvastatin (CRESTOR) 10 MG tablet Take 5 mg by mouth daily.    [provider]      Allergies    Bee venom    Review of Systems   Review of Systems  Constitutional:  Positive for activity change and appetite change. Negative for fatigue and fever.  HENT:  Negative for congestion and rhinorrhea.   Respiratory:  Negative for cough, chest tightness and shortness of breath.   Gastrointestinal:  Positive for abdominal pain, constipation, nausea and vomiting.  Genitourinary:  Negative for dysuria and hematuria.  Musculoskeletal:  Positive for back pain. Negative for arthralgias.  Skin:  Negative for rash.  Neurological:  Negative for weakness and headaches.   all other systems are negative except as noted in the HPI and PMH.    Physical Exam Updated Vital Signs BP 125/77 (BP Location: Left Arm)   Pulse 72   Temp 98 F (36.7 C) (Oral)   Resp 11   Ht 5' 10"$  (1.778 m)   Wt 103.4 kg  SpO2 97%   BMI 32.71 kg/m  Physical Exam Vitals and nursing note reviewed.  Constitutional:      General: He is not in acute distress.    Appearance: He is well-developed.  HENT:     Head: Normocephalic and atraumatic.     Mouth/Throat:     Pharynx: No oropharyngeal exudate.  Eyes:     Conjunctiva/sclera: Conjunctivae normal.     Pupils: Pupils are equal, round, and reactive to light.  Neck:     Comments: No meningismus. Cardiovascular:     Rate and Rhythm: Normal rate and regular rhythm.     Heart sounds: Normal heart sounds. No murmur heard. Pulmonary:     Effort: Pulmonary effort is normal. No  respiratory distress.     Breath sounds: Normal breath sounds.  Abdominal:     Palpations: Abdomen is soft.     Tenderness: There is abdominal tenderness. There is guarding. There is no rebound.     Comments: Periumbilical right lower quadrant tenderness with voluntary guarding.  No rebound  Musculoskeletal:        General: Tenderness present. Normal range of motion.     Cervical back: Normal range of motion and neck supple.     Comments: Diffuse paraspinal lumbar tenderness bilaterally  Skin:    General: Skin is warm.  Neurological:     Mental Status: He is alert and oriented to person, place, and time.     Cranial Nerves: No cranial nerve deficit.     Motor: No abnormal muscle tone.     Coordination: Coordination normal.     Comments: No ataxia on finger to nose bilaterally. No pronator drift. 5/5 strength throughout. CN 2-12 intact.Equal grip strength. Sensation intact.   Psychiatric:        Behavior: Behavior normal.     ED Results / Procedures / Treatments   Labs (all labs ordered are listed, but only abnormal results are displayed) Labs Reviewed  RESP PANEL BY RT-PCR (RSV, FLU A&B, COVID)  [id number]  CBC WITH DIFFERENTIAL/PLATELET  COMPREHENSIVE METABOLIC PANEL  LIPASE, BLOOD  URINALYSIS, ROUTINE W REFLEX MICROSCOPIC  LACTIC ACID, PLASMA  LACTIC ACID, PLASMA  TROPONIN I (HIGH SENSITIVITY)    EKG EKG Interpretation  Date/Time:  Wednesday August 01 2022 22:41:41 EST Ventricular Rate:  76 PR Interval:  148 QRS Duration: 79 QT Interval:  389 QTC Calculation: 438 R Axis:   74 Text Interpretation: Sinus rhythm Consider left atrial enlargement No significant change was found Confirmed by Ezequiel Essex [phone]) on 08/01/2022 10:59:51 PM  Radiology DG Chest 2 View  Result Date: 08/01/2022 CLINICAL DATA:  Vomiting. EXAM: CHEST - 2 VIEW COMPARISON:  Chest x-ray 12/05/2017 FINDINGS: The heart size and mediastinal contours are within normal limits. Both lungs are clear.  The visualized skeletal structures are unremarkable. IMPRESSION: No active cardiopulmonary disease. Electronically Signed   By: Ronney Asters M.D.   On: 08/01/2022 19:01    Procedures Procedures  {Document cardiac monitor, telemetry assessment procedure when appropriate:1}  Medications Ordered in ED Medications  ondansetron (ZOFRAN-ODT) disintegrating tablet 4 mg (has no administration in time range)  sodium chloride 0.9 % bolus 1,000 mL (has no administration in time range)  morphine (PF) 4 MG/ML injection 4 mg (has no administration in time range)  ondansetron (ZOFRAN) injection 4 mg (has no administration in time range)    ED Course/ Medical Decision Making/ A&P   {   Click here for ABCD2, HEART and other calculatorsREFRESH Note before signing :  1}                          Medical Decision Making Amount and/or Complexity of Data Reviewed Labs: ordered. Decision-making details documented in ED Course. Radiology: ordered and independent interpretation performed. Decision-making details documented in ED Course. ECG/medicine tests: ordered and independent interpretation performed. Decision-making details documented in ED Course.  Risk Prescription drug management.   Lower abdominal pain with nausea and vomiting for the past 2 weeks.  Vital stable, no distress.  Abdomen soft but tender in the right lower quadrant.  Will check labs, hydrate and check CT scan to rule out appendicitis.  {Document critical care time when appropriate:1} {Document review of labs and clinical decision tools ie heart score, Chads2Vasc2 etc:1}  {Document your independent review of radiology images, and any outside records:1} {Document your discussion with family members, caretakers, and with consultants:1} {Document social determinants of health affecting pt's care:1} {Document your decision making why or why not admission, treatments were needed:1} Final Clinical Impression(s) / ED Diagnoses Final  diagnoses:  None    Rx / DC Orders ED Discharge Orders     None

## 2022-08-01 NOTE — ED Provider Triage Note (Signed)
Emergency Medicine Provider Triage Evaluation Note  Jeremy Medina. , a 72 y.o. male  was evaluated in triage.  Pt complains of nausea and vomiting x several episodes daily for 3-4 weeks with epigastric pain. States he has been vomiting forcefully and is concerned for rib fractures as he has pain to bilateral rib cage and has previously fractured ribs. States he is on methadone due to history of chronic pain medication use but has been unable to take this for the last week. He denies fever, chills, diarrhea, dysuria, or other new symptoms. .  Review of Systems  Positive: See HPI Negative: See HPI  Physical Exam  BP (!) 142/94   Pulse 95   Temp 98.6 F (37 C) (Oral)   Resp 18   Ht 5' 10"$  (1.778 m)   Wt 103.4 kg   SpO2 99%   BMI 32.71 kg/m  Gen:   Awake, no distress   Resp:  Normal effort  MSK:   Moves extremities without difficulty  Other:  Abdomen diffusely tender, no rebound or guarding  Medical Decision Making  Medically screening exam initiated at 7:29 PM.  Appropriate orders placed.  Ree Shay. was informed that the remainder of the evaluation will be completed by another provider, this initial triage assessment does not replace that evaluation, and the importance of remaining in the ED until their evaluation is complete.     Suzzette Righter, PA-C 08/01/22 1931

## 2022-08-01 NOTE — ED Triage Notes (Signed)
Patient arrives ambulatory by POV with son c/o generalized abdominal pain, sore rib cage and intermittent emesis since the beginning of the month. Patients son states when patient gets stressed he will continually throw up. States unable to keep any solids down and has not had many BMs over the past two weeks.

## 2022-08-02 ENCOUNTER — Emergency Department (HOSPITAL_COMMUNITY): Payer: No Typology Code available for payment source

## 2022-08-02 ENCOUNTER — Encounter (HOSPITAL_COMMUNITY): Payer: Self-pay

## 2022-08-02 LAB — COMPREHENSIVE METABOLIC PANEL
ALT: 13 U/L (ref 0–44)
AST: 15 U/L (ref 15–41)
Albumin: 4.6 g/dL (ref 3.5–5.0)
Alkaline Phosphatase: 59 U/L (ref 38–126)
Anion gap: 9 (ref 5–15)
BUN: 23 mg/dL (ref 8–23)
CO2: 25 mmol/L (ref 22–32)
Calcium: 9.7 mg/dL (ref 8.9–10.3)
Chloride: 100 mmol/L (ref 98–111)
Creatinine, Ser: 1.48 mg/dL — ABNORMAL HIGH (ref 0.61–1.24)
GFR, Estimated: 51 mL/min — ABNORMAL LOW (ref >60)
Glucose, Bld: 226 mg/dL — ABNORMAL HIGH (ref 70–99)
Potassium: 4.2 mmol/L (ref 3.5–5.1)
Sodium: 134 mmol/L — ABNORMAL LOW (ref 135–145)
Total Bilirubin: 0.6 mg/dL (ref 0.3–1.2)
Total Protein: 8.2 g/dL — ABNORMAL HIGH (ref 6.5–8.1)

## 2022-08-02 LAB — LIPASE, BLOOD: Lipase: 30 U/L (ref 11–51)

## 2022-08-02 LAB — LACTIC ACID, PLASMA: Lactic Acid, Venous: 1.2 mmol/L (ref 0.5–1.9)

## 2022-08-02 LAB — TROPONIN I (HIGH SENSITIVITY): Troponin I (High Sensitivity): 9 ng/L (ref <18)

## 2022-08-02 MED ORDER — ONDANSETRON HCL 4 MG/2ML IJ SOLN: 4.0000 mg | Freq: Once | INTRAMUSCULAR | Status: DC

## 2022-08-02 MED ORDER — LACTATED RINGERS IV BOLUS: 1000.0000 mL | Freq: Once | INTRAVENOUS | Status: DC

## 2022-08-02 MED ORDER — SODIUM CHLORIDE (PF) 0.9 % IJ SOLN
INTRAMUSCULAR | Status: AC
  Filled 2022-08-02: qty 50

## 2022-08-02 MED ORDER — IOHEXOL 300 MG/ML  SOLN
100.0000 mL | Freq: Once | INTRAMUSCULAR | Status: AC | PRN
  Administered 2022-08-02: 100 mL via INTRAVENOUS

## 2022-08-02 NOTE — ED Notes (Signed)
Patient became agitated and requested to leave. IV removed. Patient ambulated from ED

## 2022-08-03 ENCOUNTER — Emergency Department (HOSPITAL_COMMUNITY)
Admission: EM | Admit: 2022-08-03 | Discharge: 2022-08-03 | Disposition: A | Discharge status: Home / Self Care | Payer: No Typology Code available for payment source | Attending: Emergency Medicine | Admitting: Emergency Medicine

## 2022-08-03 ENCOUNTER — Other Ambulatory Visit: Payer: Self-pay

## 2022-08-03 ENCOUNTER — Encounter (HOSPITAL_COMMUNITY): Payer: Self-pay | Admitting: Emergency Medicine

## 2022-08-03 DIAGNOSIS — E1165 Type 2 diabetes mellitus with hyperglycemia: Secondary | ICD-10-CM | POA: Insufficient documentation

## 2022-08-03 DIAGNOSIS — M549 Dorsalgia, unspecified: Secondary | ICD-10-CM | POA: Insufficient documentation

## 2022-08-03 DIAGNOSIS — R110 Nausea: Secondary | ICD-10-CM

## 2022-08-03 DIAGNOSIS — Z794 Long term (current) use of insulin: Secondary | ICD-10-CM | POA: Diagnosis not present

## 2022-08-03 DIAGNOSIS — R112 Nausea with vomiting, unspecified: Secondary | ICD-10-CM | POA: Insufficient documentation

## 2022-08-03 DIAGNOSIS — Z79899 Other long term (current) drug therapy: Secondary | ICD-10-CM | POA: Diagnosis not present

## 2022-08-03 LAB — URINALYSIS, ROUTINE W REFLEX MICROSCOPIC
Bacteria, UA: NONE SEEN
Bilirubin Urine: NEGATIVE
Glucose, UA: >=500 mg/dL — AB
Hgb urine dipstick: NEGATIVE
Ketones, ur: NEGATIVE mg/dL
Leukocytes,Ua: NEGATIVE
Nitrite: NEGATIVE
Protein, ur: NEGATIVE mg/dL
Specific Gravity, Urine: 1.02 (ref 1.005–1.030)
pH: 5 (ref 5.0–8.0)

## 2022-08-03 LAB — CBC
HCT: 45.7 % (ref 39.0–52.0)
Hemoglobin: 14.8 g/dL (ref 13.0–17.0)
MCH: 27.8 pg (ref 26.0–34.0)
MCHC: 32.4 g/dL (ref 30.0–36.0)
MCV: 85.7 fL (ref 80.0–100.0)
Platelets: 219 10*3/uL (ref 150–400)
RBC: 5.33 MIL/uL (ref 4.22–5.81)
RDW: 14.3 % (ref 11.5–15.5)
WBC: 10.4 10*3/uL (ref 4.0–10.5)
nRBC: 0 % (ref 0.0–0.2)

## 2022-08-03 LAB — COMPREHENSIVE METABOLIC PANEL
ALT: 13 U/L (ref 0–44)
AST: 15 U/L (ref 15–41)
Albumin: 4.1 g/dL (ref 3.5–5.0)
Alkaline Phosphatase: 58 U/L (ref 38–126)
Anion gap: 9 (ref 5–15)
BUN: 17 mg/dL (ref 8–23)
CO2: 24 mmol/L (ref 22–32)
Calcium: 9.2 mg/dL (ref 8.9–10.3)
Chloride: 102 mmol/L (ref 98–111)
Creatinine, Ser: 0.98 mg/dL (ref 0.61–1.24)
GFR, Estimated: >60 mL/min (ref >60)
Glucose, Bld: 138 mg/dL — ABNORMAL HIGH (ref 70–99)
Potassium: 4.1 mmol/L (ref 3.5–5.1)
Sodium: 135 mmol/L (ref 135–145)
Total Bilirubin: 0.5 mg/dL (ref 0.3–1.2)
Total Protein: 7.6 g/dL (ref 6.5–8.1)

## 2022-08-03 LAB — LIPASE, BLOOD: Lipase: 28 U/L (ref 11–51)

## 2022-08-03 MED ORDER — ONDANSETRON 4 MG PO TBDP: 4.0000 mg | ORAL_TABLET | Freq: Three times a day (TID) | ORAL | 0 refills | Status: DC | PRN

## 2022-08-03 NOTE — ED Provider Notes (Signed)
[facility] EMERGENCY DEPARTMENT AT [facility] Provider Note   CSN: [id number] Arrival date & time: 08/03/22  1850     History  Chief Complaint  Patient presents with   Flank Pain   Nausea    Jeremy Medina. is a 72 y.o. male accompanied by his son at the bedisde who presents with his son with concern for nausea and vomiting persisting since January 31 through 2/14, improved today with only 1 episode of nausea and vomiting.  Previously was having 6 episodes of NBNB emesis daily unable to tolerate p.o.  Has been taking Dramamine today which has helped with his nausea vomiting.  Was seen in the ED On the night of 2/14 with laboratory studies, CT head, and CT abdomen pelvis.  CTs were reassuring, labs revealed AKI with creatinine increased from 0.8-1.48.  Recommendation at that time was for admission but patient declined and left the emergency department AMA.  Returns at this time due to family prompting given concern for kidney injury at last visit.  Overall endorses improvement.  Does endorse worsened back pain since onset of symptoms, extensive history of back surgeries following multiple traumas both in the [employer] and subsequently.  Was previously on 40 mg of methadone daily, immediately prior to onset of current symptoms methadone dose was decreased to 30 mg daily. No saddle anesthesia, urinary incontinence or retention.  In addition to the wellness history of reviewed his medical records.  He has history of type 2 diabetes, PTSD, hyperlipidemia.  He is on insulin, compliant.  No anticoagulation.   HPI     Home Medications Prior to Admission medications   Medication Sig Start Date End Date Taking? Authorizing Provider  bisacodyl (DULCOLAX) 10 MG suppository Place 1 suppository (10 mg total) rectally daily as needed for mild constipation or moderate constipation. Patient not taking: Reported on 08/01/2022 08/31/15   [name], PA-C  cephALEXin (KEFLEX) 500 MG  capsule Take 1 capsule (500 mg total) by mouth 4 (four) times daily. Patient not taking: Reported on 08/01/2022 04/27/19   Janeece Agee, NP  cholecalciferol (VITAMIN D3) 25 MCG (1000 UNIT) tablet Take 1,000 Units by mouth daily.    [provider]  docusate sodium (COLACE) 100 MG capsule Take 1 capsule (100 mg total) by mouth 2 (two) times daily. Patient not taking: Reported on 08/01/2022 08/31/15   [name], PA-C  insulin aspart protamine- aspart (NOVOLOG MIX 70/30) (70-30) 100 UNIT/ML injection Inject 18 Units into the skin in the morning, at noon, and at bedtime.    [provider]  lisinopril (ZESTRIL) 5 MG tablet Take 5 mg by mouth daily.    [provider]  LORazepam (ATIVAN) 1 MG tablet Take 1 tablet by mouth every 8 (eight) hours as needed for anxiety. 10/01/19   [provider]  methadone (DOLOPHINE) 10 MG tablet Take 30 mg by mouth daily. 10/01/19   [provider]  naproxen (NAPROSYN) 500 MG tablet Take 1 tablet (500 mg total) by mouth 2 (two) times daily with a meal. Patient not taking: Reported on 08/01/2022 08/31/15   [name], PA-C  rosuvastatin (CRESTOR) 40 MG tablet Take 40 mg by mouth daily.    [provider]      Allergies    Bee venom    Review of Systems   Review of Systems  Constitutional:  Positive for appetite change and fatigue. Negative for chills and fever.  HENT: Negative.    Eyes:  Negative.   Respiratory: Negative.    Cardiovascular: Negative.   Gastrointestinal:  Positive for nausea and vomiting. Negative for constipation and diarrhea.  Genitourinary: Negative.   Musculoskeletal:  Positive for back pain.  Skin: Negative.   Neurological:  Negative for dizziness, tremors, seizures, syncope, speech difficulty, weakness, light-headedness and headaches.    Physical Exam Updated Vital Signs BP 127/79   Pulse 77   Temp 98.6 F (37 C) (Oral)   Resp 18   Ht 5\' 10"  (1.778 m)   Wt 102.1 kg   SpO2  98%   BMI 32.30 kg/m  Physical Exam Vitals and nursing note reviewed.  Constitutional:      Appearance: He is not ill-appearing or toxic-appearing.  HENT:     Head: Normocephalic and atraumatic.     Mouth/Throat:     Mouth: Mucous membranes are moist.     Pharynx: Oropharynx is clear. Uvula midline. No oropharyngeal exudate or posterior oropharyngeal erythema.  Eyes:     General:        Right eye: No discharge.        Left eye: No discharge.     Extraocular Movements: Extraocular movements intact.     Conjunctiva/sclera: Conjunctivae normal.     Pupils: Pupils are equal, round, and reactive to light.  Cardiovascular:     Rate and Rhythm: Normal rate and regular rhythm.     Pulses: Normal pulses.     Heart sounds: Normal heart sounds. No murmur heard. Pulmonary:     Effort: Pulmonary effort is normal. No respiratory distress.     Breath sounds: Normal breath sounds. No wheezing or rales.  Abdominal:     General: Bowel sounds are normal. There is no distension.     Palpations: Abdomen is soft.     Tenderness: There is no abdominal tenderness. There is no guarding or rebound.  Musculoskeletal:        General: No deformity.     Cervical back: Neck supple.       Back:     Right lower leg: No edema.     Left lower leg: No edema.     Comments: No midline TTP, TTP over the ribs bilaterally  Skin:    General: Skin is warm and dry.     Capillary Refill: Capillary refill takes less than 2 seconds.  Neurological:     General: No focal deficit present.     Mental Status: He is alert and oriented to person, place, and time. Mental status is at baseline.     Cranial Nerves: Cranial nerves 2-12 are intact.     Sensory: Sensation is intact.     Motor: Motor function is intact.     Coordination: Coordination is intact.     Gait: Gait is intact.     Comments: Antalgic gait with back pain, but ambulatory unassisted in the ED.   Psychiatric:        Mood and Affect: Mood normal.      ED Results / Procedures / Treatments   Labs (all labs ordered are listed, but only abnormal results are displayed) Labs Reviewed  COMPREHENSIVE METABOLIC PANEL - Abnormal; Notable for the following components:      Result Value   Glucose, Bld 138 (*)    All other components within normal limits  URINALYSIS, ROUTINE W REFLEX MICROSCOPIC - Abnormal; Notable for the following components:   Glucose, UA >=500 (*)    All other components within normal limits  LIPASE,  BLOOD  CBC    EKG None  Radiology CT Head Wo Contrast  Result Date: 08/02/2022 CLINICAL DATA:  Headache, sudden, severe EXAM: CT HEAD WITHOUT CONTRAST TECHNIQUE: Contiguous axial images were obtained from the base of the skull through the vertex without intravenous contrast. RADIATION DOSE REDUCTION: This exam was performed according to the departmental dose-optimization program which includes automated exposure control, adjustment of the mA and/or kV according to patient size and/or use of iterative reconstruction technique. COMPARISON:  03/16/2015 FINDINGS: Brain: Normal anatomic configuration. Parenchymal volume loss is commensurate with the patient's age. Stable mild periventricular and deep white matter changes are present likely reflecting the sequela of small vessel ischemia. No abnormal intra or extra-axial mass lesion or fluid collection. No abnormal mass effect or midline shift. No evidence of acute intracranial hemorrhage or infarct. Ventricular size is normal. Cerebellum unremarkable. Vascular: No asymmetric hyperdense vasculature at the skull base. Skull: Intact Sinuses/Orbits: Paranasal sinuses are clear. Orbits are unremarkable. Other: Mastoid air cells and middle ear cavities are clear. IMPRESSION: 1. No acute intracranial hemorrhage or infarct. 2. Stable mild senescent change. Electronically Signed   By: Helyn Numbers M.D.   On: 08/02/2022 01:43   CT ABDOMEN PELVIS W CONTRAST  Result Date: 08/02/2022 CLINICAL  DATA:  Right lower quadrant abdominal pain, nausea, vomiting EXAM: CT ABDOMEN AND PELVIS WITH CONTRAST TECHNIQUE: Multidetector CT imaging of the abdomen and pelvis was performed using the standard protocol following bolus administration of intravenous contrast. RADIATION DOSE REDUCTION: This exam was performed according to the departmental dose-optimization program which includes automated exposure control, adjustment of the mA and/or kV according to patient size and/or use of iterative reconstruction technique. CONTRAST:  OMNIPAQUE IOHEXOL 300 MG/ML  SOLN COMPARISON:  None Available. FINDINGS: Lower chest: No acute abnormality. Moderate coronary artery calcification. Small hiatal hernia. Hepatobiliary: No focal liver abnormality is seen. No gallstones, gallbladder wall thickening, or biliary dilatation. Pancreas: Unremarkable Spleen: Unremarkable Adrenals/Urinary Tract: The adrenal glands are unremarkable. The kidneys are normal in size and position. 10 mm nonobstructing calculus is noted within the interpolar region of the right kidney. The kidneys are otherwise unremarkable. Bladder unremarkable. Stomach/Bowel: Stomach is within normal limits. Appendix appears normal. No evidence of bowel wall thickening, distention, or inflammatory changes. Vascular/Lymphatic: Aortic atherosclerosis. No enlarged abdominal or pelvic lymph nodes. Reproductive: Prostate is unremarkable. Other: Tiny fat containing umbilical hernia. No abdominopelvic ascites. Musculoskeletal: No acute bone abnormality. Multiple healed remote rib fractures are noted bilaterally. No lytic or blastic bone lesion. Degenerative changes are seen within the lumbar spine. IMPRESSION: 1. No acute intra-abdominal pathology identified. No definite radiographic explanation for the patient's reported symptoms. 2. Moderate coronary artery calcification. 3. Mild right nonobstructing nephrolithiasis. 4. Aortic atherosclerosis. Aortic Atherosclerosis  (ICD10-I70.0). Electronically Signed   By: Helyn Numbers M.D.   On: 08/02/2022 01:39    Procedures Procedures    Medications Ordered in ED Medications - No data to display  ED Course/ Medical Decision Making/ A&P                             Medical Decision Making 72 year old male who presents with concern for present nausea since the end of January.  Vital signs normal on intake.  Cardiopulmonary and abdominal exams are benign.  Patient neurologically intact without focal deficit on neurologic exam.  Tenderness palpation over ribs bilaterally, suspect from heaving from ongoing vomiting.   Amount and/or Complexity of Data Reviewed Labs:  ordered.    Details: CBC without leukocytosis or anemia, CMP with hyperglycemia of 138 otherwise unremarkable.  UA with glucosuria otherwise unremarkable.  Lipase normal, urine culture ordered and pending.  Risk Prescription drug management.   Patient well-appearing today, tolerating p.o. with normal hemodynamics and reassuring metabolic panel with improvement in kidney function from labs yesterday.  But exact cause of his nausea and vomiting remains unclear,Clinical concern for emergent underlying etiology of his symptoms that would warrant further ED workup or inpatient management is exceedingly low.  CT scans of the head and the abdomen from 2/14 reviewed by this provider and were reassuring.  Abdominal exam and neurologic exams today are reassuring.    Question contribution of recent change in methadone dosage to patient's presenting symptoms recommend he follows up with his pain management provider.  Will discharge with prescription for Zofran.  Recommend close outpatient follow-up and strict return precautions were given.  No occasion for admission at this time.  Aydrian and his son  voiced understanding of his medical evaluation and treatment plan. Each of their questions answered to their expressed satisfaction.  Return precautions were given.   Patient is well-appearing, stable, and was discharged in good condition.  This chart was dictated using voice recognition software, Dragon. Despite the best efforts of this provider to proofread and correct errors, errors may still occur which can change documentation meaning.     Final Clinical Impression(s) / ED Diagnoses Final diagnoses:  None    Rx / DC Orders ED Discharge Orders     None         Sherrilee Gilles 08/04/22 0007    Tegeler, Canary Brim, MD 08/04/22 [phone]

## 2022-08-03 NOTE — ED Triage Notes (Signed)
The patient presents because he has not improved since he was seen on 08/01/2022. He left AMA after finding out he had kidney failure and wants to be checked again. He has been nauseous since 07/19/2022. When he coughs, he complains that pain starts in the kidney area and radiates up his back.

## 2022-08-03 NOTE — ED Provider Notes (Incomplete)
[place] EMERGENCY DEPARTMENT AT [facility] Provider Note   CSN: [id number] Arrival date & time: 08/03/22  1850     History {Add pertinent medical, surgical, social history, OB history to HPI:1} Chief Complaint  Patient presents with  . Flank Pain  . Nausea    Jeremy Medina. is a 72 y.o. male accompanied by his son at the bedisde who presents with his son with concern for nausea and vomiting persisting since January 31 through 2/14, improved today with only 1 episode of nausea and vomiting.  Previously was having 6 episodes of NBNB emesis daily unable to tolerate p.o.  Has been taking Dramamine today which has helped with his nausea vomiting.  Was seen in the ED On the night of 2/14 with laboratory studies, CT head, and CT abdomen pelvis.  CTs were reassuring, labs revealed AKI with creatinine increased from 0.8-1.48.  Recommendation at that time was for admission but patient declined and left the emergency department AMA.  Returns at this time due to family prompting given concern for kidney injury at last visit.  Overall endorses improvement.  Does endorse worsened back pain since onset of symptoms, extensive history of back surgeries following multiple traumas both in the [employer] and subsequently.  Was previously on 40 mg of methadone daily, immediately prior to onset of current symptoms methadone dose was decreased to 30 mg daily. No saddle anesthesia, urinary incontinence or retention.  In addition to the wellness history of reviewed his medical records.  He has history of type 2 diabetes, PTSD, hyperlipidemia.  He is on insulin, compliant.  No anticoagulation.   HPI     Home Medications Prior to Admission medications   Medication Sig Start Date End Date Taking? Authorizing Provider  bisacodyl (DULCOLAX) 10 MG suppository Place 1 suppository (10 mg total) rectally daily as needed for mild constipation or moderate constipation. Patient not taking: Reported on  08/01/2022 08/31/15   Nat Christen, PA-C  cephALEXin (KEFLEX) 500 MG capsule Take 1 capsule (500 mg total) by mouth 4 (four) times daily. Patient not taking: Reported on 08/01/2022 04/27/19   Maximiano Coss, NP  cholecalciferol (VITAMIN D3) 25 MCG (1000 UNIT) tablet Take 1,000 Units by mouth daily.    [provider]  docusate sodium (COLACE) 100 MG capsule Take 1 capsule (100 mg total) by mouth 2 (two) times daily. Patient not taking: Reported on 08/01/2022 08/31/15   Nat Christen, PA-C  insulin aspart protamine- aspart (NOVOLOG MIX 70/30) (70-30) 100 UNIT/ML injection Inject 18 Units into the skin in the morning, at noon, and at bedtime.    [provider]  lisinopril (ZESTRIL) 5 MG tablet Take 5 mg by mouth daily.    [provider]  LORazepam (ATIVAN) 1 MG tablet Take 1 tablet by mouth every 8 (eight) hours as needed for anxiety. 10/01/19   [provider]  methadone (DOLOPHINE) 10 MG tablet Take 30 mg by mouth daily. 10/01/19   [provider]  naproxen (NAPROSYN) 500 MG tablet Take 1 tablet (500 mg total) by mouth 2 (two) times daily with a meal. Patient not taking: Reported on 08/01/2022 08/31/15   Nat Christen, PA-C  rosuvastatin (CRESTOR) 40 MG tablet Take 40 mg by mouth daily.    [provider]      Allergies    Bee venom    Review of Systems   Review of Systems  Constitutional:  Positive for appetite change and fatigue. Negative for chills  and fever.  HENT: Negative.    Eyes: Negative.   Respiratory: Negative.    Cardiovascular: Negative.   Gastrointestinal:  Positive for nausea and vomiting. Negative for constipation and diarrhea.  Genitourinary: Negative.   Musculoskeletal:  Positive for back pain.  Skin: Negative.   Neurological:  Negative for dizziness, tremors, seizures, syncope, speech difficulty, weakness, light-headedness and headaches.    Physical Exam Updated Vital Signs BP 127/79   Pulse 77   Temp 98.6 F  (37 C) (Oral)   Resp 18   Ht 5' 10"$  (1.778 m)   Wt 102.1 kg   SpO2 98%   BMI 32.30 kg/m  Physical Exam Vitals and nursing note reviewed.  Constitutional:      Appearance: He is not ill-appearing or toxic-appearing.  HENT:     Head: Normocephalic and atraumatic.     Mouth/Throat:     Mouth: Mucous membranes are moist.     Pharynx: Oropharynx is clear. Uvula midline. No oropharyngeal exudate or posterior oropharyngeal erythema.  Eyes:     General:        Right eye: No discharge.        Left eye: No discharge.     Extraocular Movements: Extraocular movements intact.     Conjunctiva/sclera: Conjunctivae normal.     Pupils: Pupils are equal, round, and reactive to light.  Cardiovascular:     Rate and Rhythm: Normal rate and regular rhythm.     Pulses: Normal pulses.     Heart sounds: Normal heart sounds. No murmur heard. Pulmonary:     Effort: Pulmonary effort is normal. No respiratory distress.     Breath sounds: Normal breath sounds. No wheezing or rales.  Abdominal:     General: Bowel sounds are normal. There is no distension.     Palpations: Abdomen is soft.     Tenderness: There is no abdominal tenderness. There is no guarding or rebound.  Musculoskeletal:        General: No deformity.     Cervical back: Neck supple.       Back:     Right lower leg: No edema.     Left lower leg: No edema.     Comments: No midline TTP, TTP over the ribs bilaterally  Skin:    General: Skin is warm and dry.     Capillary Refill: Capillary refill takes less than 2 seconds.  Neurological:     General: No focal deficit present.     Mental Status: He is alert and oriented to person, place, and time. Mental status is at baseline.     Cranial Nerves: Cranial nerves 2-12 are intact.     Sensory: Sensation is intact.     Motor: Motor function is intact.     Coordination: Coordination is intact.     Gait: Gait is intact.     Comments: Antalgic gait with back pain, but ambulatory unassisted  in the ED.   Psychiatric:        Mood and Affect: Mood normal.     ED Results / Procedures / Treatments   Labs (all labs ordered are listed, but only abnormal results are displayed) Labs Reviewed  COMPREHENSIVE METABOLIC PANEL - Abnormal; Notable for the following components:      Result Value   Glucose, Bld 138 (*)    All other components within normal limits  URINALYSIS, ROUTINE W REFLEX MICROSCOPIC - Abnormal; Notable for the following components:   Glucose, UA >=500 (*)  All other components within normal limits  LIPASE, BLOOD  CBC    EKG None  Radiology CT Head Wo Contrast  Result Date: 08/02/2022 CLINICAL DATA:  Headache, sudden, severe EXAM: CT HEAD WITHOUT CONTRAST TECHNIQUE: Contiguous axial images were obtained from the base of the skull through the vertex without intravenous contrast. RADIATION DOSE REDUCTION: This exam was performed according to the departmental dose-optimization program which includes automated exposure control, adjustment of the mA and/or kV according to patient size and/or use of iterative reconstruction technique. COMPARISON:  03/16/2015 FINDINGS: Brain: Normal anatomic configuration. Parenchymal volume loss is commensurate with the patient's age. Stable mild periventricular and deep white matter changes are present likely reflecting the sequela of small vessel ischemia. No abnormal intra or extra-axial mass lesion or fluid collection. No abnormal mass effect or midline shift. No evidence of acute intracranial hemorrhage or infarct. Ventricular size is normal. Cerebellum unremarkable. Vascular: No asymmetric hyperdense vasculature at the skull base. Skull: Intact Sinuses/Orbits: Paranasal sinuses are clear. Orbits are unremarkable. Other: Mastoid air cells and middle ear cavities are clear. IMPRESSION: 1. No acute intracranial hemorrhage or infarct. 2. Stable mild senescent change. Electronically Signed   By: Fidela Salisbury M.D.   On: 08/02/2022 01:43    CT ABDOMEN PELVIS W CONTRAST  Result Date: 08/02/2022 CLINICAL DATA:  Right lower quadrant abdominal pain, nausea, vomiting EXAM: CT ABDOMEN AND PELVIS WITH CONTRAST TECHNIQUE: Multidetector CT imaging of the abdomen and pelvis was performed using the standard protocol following bolus administration of intravenous contrast. RADIATION DOSE REDUCTION: This exam was performed according to the departmental dose-optimization program which includes automated exposure control, adjustment of the mA and/or kV according to patient size and/or use of iterative reconstruction technique. CONTRAST:  181m OMNIPAQUE IOHEXOL 300 MG/ML  SOLN COMPARISON:  None Available. FINDINGS: Lower chest: No acute abnormality. Moderate coronary artery calcification. Small hiatal hernia. Hepatobiliary: No focal liver abnormality is seen. No gallstones, gallbladder wall thickening, or biliary dilatation. Pancreas: Unremarkable Spleen: Unremarkable Adrenals/Urinary Tract: The adrenal glands are unremarkable. The kidneys are normal in size and position. 10 mm nonobstructing calculus is noted within the interpolar region of the right kidney. The kidneys are otherwise unremarkable. Bladder unremarkable. Stomach/Bowel: Stomach is within normal limits. Appendix appears normal. No evidence of bowel wall thickening, distention, or inflammatory changes. Vascular/Lymphatic: Aortic atherosclerosis. No enlarged abdominal or pelvic lymph nodes. Reproductive: Prostate is unremarkable. Other: Tiny fat containing umbilical hernia. No abdominopelvic ascites. Musculoskeletal: No acute bone abnormality. Multiple healed remote rib fractures are noted bilaterally. No lytic or blastic bone lesion. Degenerative changes are seen within the lumbar spine. IMPRESSION: 1. No acute intra-abdominal pathology identified. No definite radiographic explanation for the patient's reported symptoms. 2. Moderate coronary artery calcification. 3. Mild right nonobstructing  nephrolithiasis. 4. Aortic atherosclerosis. Aortic Atherosclerosis (ICD10-I70.0). Electronically Signed   By: AFidela SalisburyM.D.   On: 08/02/2022 01:39    Procedures Procedures  {Document cardiac monitor, telemetry assessment procedure when appropriate:1}  Medications Ordered in ED Medications - No data to display  ED Course/ Medical Decision Making/ A&P   {   Click here for ABCD2, HEART and other calculatorsREFRESH Note before signing :1}                          Medical Decision Making 72year old male who presents with concern for present nausea since the end of January.  Vital signs normal on intake.  Cardiopulmonary and abdominal exams are benign.  Patient  neurologically intact without focal deficit on neurologic exam.  Tenderness palpation over ribs bilaterally, suspect from heaving.   Amount and/or Complexity of Data Reviewed Labs: ordered.    Details: CBC without leukocytosis or anemia, CMP with hyperglycemia of 138 otherwise unremarkable.  UA with glucosuria otherwise unremarkable.  Lipase normal, urine culture ordered and pending.  Risk Prescription drug management.   ***  {Document critical care time when appropriate:1} {Document review of labs and clinical decision tools ie heart score, Chads2Vasc2 etc:1}  {Document your independent review of radiology images, and any outside records:1} {Document your discussion with family members, caretakers, and with consultants:1} {Document social determinants of health affecting pt's care:1} {Document your decision making why or why not admission, treatments were needed:1} Final Clinical Impression(s) / ED Diagnoses Final diagnoses:  None    Rx / DC Orders ED Discharge Orders     None

## 2022-08-03 NOTE — Discharge Instructions (Signed)
You are seen in the ER today for your nausea.  Your blood work today was reassuring as were your vital signs and physical exam.  Do encourage you to follow-up with your primary care provider to discuss dosage for your medications as this is likely contributing to your ongoing symptoms.  While the exact cause of your nausea remains unclear, there does not appear to be any emergent problem at this time.  Return to the ER with any severe symptoms and use the prescribed nausea medication as needed.  Would recommend you pick up a stool softener as well as the nausea medication can be constipating.  A stool softener such as Colace may be helpful.

## 2022-08-03 NOTE — ED Provider Triage Note (Signed)
Emergency Medicine Provider Triage Evaluation Note  [place]. , a 72 y.o. male  was evaluated in triage.  Pt complains of nausea vomiting flank pain since 07/18/2022.  Patient was evaluated 2 days ago, diagnosed with AKI however left AMA.  He returns today with continuous nausea vomiting flank pain and back pain.  Review of Systems  Positive: As above Negative: As above  Physical Exam  BP 119/83 (BP Location: Left Arm)   Pulse 70   Temp 98.7 F (37.1 C) (Oral)   Resp 13   Ht 5' 10"$  (1.778 m)   Wt 102.1 kg   SpO2 98%   BMI 32.28 kg/m  Gen:   Awake, no distress   Resp:  Normal effort  MSK:   Moves extremities without difficulty  Other:    Medical Decision Making  Medically screening exam initiated at 8:03 PM.  Appropriate orders placed.  Ree Shay. was informed that the remainder of the evaluation will be completed by another provider, this initial triage assessment does not replace that evaluation, and the importance of remaining in the ED until their evaluation is complete.    Rex Kras, [place] 08/04/22 [phone]

## 2022-08-05 LAB — URINE CULTURE: Culture: <10000 — AB

## 2022-09-13 ENCOUNTER — Encounter (HOSPITAL_COMMUNITY): Payer: Self-pay

## 2022-09-13 ENCOUNTER — Emergency Department (HOSPITAL_COMMUNITY): Payer: Medicare Other

## 2022-09-13 ENCOUNTER — Other Ambulatory Visit: Payer: Self-pay

## 2022-09-13 ENCOUNTER — Emergency Department (HOSPITAL_COMMUNITY)
Admission: EM | Admit: 2022-09-13 | Discharge: 2022-09-14 | Disposition: A | Discharge status: Home / Self Care | Payer: Medicare Other | Attending: Emergency Medicine | Admitting: Emergency Medicine

## 2022-09-13 DIAGNOSIS — I10 Essential (primary) hypertension: Secondary | ICD-10-CM | POA: Diagnosis not present

## 2022-09-13 DIAGNOSIS — R109 Unspecified abdominal pain: Secondary | ICD-10-CM | POA: Diagnosis not present

## 2022-09-13 DIAGNOSIS — R531 Weakness: Secondary | ICD-10-CM | POA: Insufficient documentation

## 2022-09-13 DIAGNOSIS — G9340 Encephalopathy, unspecified: Secondary | ICD-10-CM | POA: Diagnosis not present

## 2022-09-13 DIAGNOSIS — R197 Diarrhea, unspecified: Secondary | ICD-10-CM | POA: Insufficient documentation

## 2022-09-13 DIAGNOSIS — Z79899 Other long term (current) drug therapy: Secondary | ICD-10-CM | POA: Diagnosis not present

## 2022-09-13 DIAGNOSIS — E119 Type 2 diabetes mellitus without complications: Secondary | ICD-10-CM | POA: Diagnosis not present

## 2022-09-13 DIAGNOSIS — Z1152 Encounter for screening for COVID-19: Secondary | ICD-10-CM | POA: Insufficient documentation

## 2022-09-13 DIAGNOSIS — R4182 Altered mental status, unspecified: Secondary | ICD-10-CM | POA: Insufficient documentation

## 2022-09-13 DIAGNOSIS — D72829 Elevated white blood cell count, unspecified: Secondary | ICD-10-CM | POA: Diagnosis not present

## 2022-09-13 DIAGNOSIS — R410 Disorientation, unspecified: Secondary | ICD-10-CM | POA: Insufficient documentation

## 2022-09-13 DIAGNOSIS — Z794 Long term (current) use of insulin: Secondary | ICD-10-CM | POA: Diagnosis not present

## 2022-09-13 LAB — CBC WITH DIFFERENTIAL/PLATELET
Abs Immature Granulocytes: 0.04 10*3/uL (ref 0.00–0.07)
Basophils Absolute: 0.1 10*3/uL (ref 0.0–0.1)
Basophils Relative: 1 %
Eosinophils Absolute: 0.1 10*3/uL (ref 0.0–0.5)
Eosinophils Relative: 1 %
HCT: 46.5 % (ref 39.0–52.0)
Hemoglobin: 15.1 g/dL (ref 13.0–17.0)
Immature Granulocytes: 0 %
Lymphocytes Relative: 24 %
Lymphs Abs: 3 10*3/uL (ref 0.7–4.0)
MCH: 27.9 pg (ref 26.0–34.0)
MCHC: 32.5 g/dL (ref 30.0–36.0)
MCV: 86 fL (ref 80.0–100.0)
Monocytes Absolute: 1 10*3/uL (ref 0.1–1.0)
Monocytes Relative: 8 %
Neutro Abs: 8.5 10*3/uL — ABNORMAL HIGH (ref 1.7–7.7)
Neutrophils Relative %: 66 %
Platelets: 237 10*3/uL (ref 150–400)
RBC: 5.41 MIL/uL (ref 4.22–5.81)
RDW: 14.5 % (ref 11.5–15.5)
WBC: 12.7 10*3/uL — ABNORMAL HIGH (ref 4.0–10.5)
nRBC: 0 % (ref 0.0–0.2)

## 2022-09-13 LAB — COMPREHENSIVE METABOLIC PANEL
ALT: 30 U/L (ref 0–44)
AST: 22 U/L (ref 15–41)
Albumin: 4 g/dL (ref 3.5–5.0)
Alkaline Phosphatase: 60 U/L (ref 38–126)
Anion gap: 13 (ref 5–15)
BUN: 19 mg/dL (ref 8–23)
CO2: 26 mmol/L (ref 22–32)
Calcium: 10.1 mg/dL (ref 8.9–10.3)
Chloride: 96 mmol/L — ABNORMAL LOW (ref 98–111)
Creatinine, Ser: 1.2 mg/dL (ref 0.61–1.24)
GFR, Estimated: >60 mL/min (ref >60)
Glucose, Bld: 216 mg/dL — ABNORMAL HIGH (ref 70–99)
Potassium: 3.5 mmol/L (ref 3.5–5.1)
Sodium: 135 mmol/L (ref 135–145)
Total Bilirubin: 0.6 mg/dL (ref 0.3–1.2)
Total Protein: 7.3 g/dL (ref 6.5–8.1)

## 2022-09-13 LAB — CBG MONITORING, ED: Glucose-Capillary: 214 mg/dL — ABNORMAL HIGH (ref 70–99)

## 2022-09-13 LAB — ETHANOL: Alcohol, Ethyl (B): <10 mg/dL (ref <10)

## 2022-09-13 LAB — AMMONIA: Ammonia: 22 umol/L (ref 9–35)
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
## 2022-09-13 NOTE — ED Notes (Signed)
Pt to CT
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
## 2022-09-14 ENCOUNTER — Emergency Department (HOSPITAL_COMMUNITY): Payer: Medicare Other

## 2022-09-14 DIAGNOSIS — E8881 Metabolic syndrome: Secondary | ICD-10-CM | POA: Insufficient documentation

## 2022-09-14 DIAGNOSIS — G9340 Encephalopathy, unspecified: Secondary | ICD-10-CM

## 2022-09-14 DIAGNOSIS — R4182 Altered mental status, unspecified: Secondary | ICD-10-CM

## 2022-09-14 DIAGNOSIS — F1010 Alcohol abuse, uncomplicated: Secondary | ICD-10-CM | POA: Insufficient documentation

## 2022-09-14 DIAGNOSIS — F411 Generalized anxiety disorder: Secondary | ICD-10-CM | POA: Insufficient documentation

## 2022-09-14 DIAGNOSIS — F419 Anxiety disorder, unspecified: Secondary | ICD-10-CM | POA: Insufficient documentation

## 2022-09-14 DIAGNOSIS — I10 Essential (primary) hypertension: Secondary | ICD-10-CM | POA: Insufficient documentation

## 2022-09-14 DIAGNOSIS — R918 Other nonspecific abnormal finding of lung field: Secondary | ICD-10-CM | POA: Insufficient documentation

## 2022-09-14 DIAGNOSIS — R410 Disorientation, unspecified: Secondary | ICD-10-CM | POA: Insufficient documentation

## 2022-09-14 DIAGNOSIS — F32A Depression, unspecified: Secondary | ICD-10-CM | POA: Insufficient documentation

## 2022-09-14 DIAGNOSIS — M5137 Other intervertebral disc degeneration, lumbosacral region: Secondary | ICD-10-CM | POA: Insufficient documentation

## 2022-09-14 DIAGNOSIS — E785 Hyperlipidemia, unspecified: Secondary | ICD-10-CM | POA: Insufficient documentation

## 2022-09-14 DIAGNOSIS — G43709 Chronic migraine without aura, not intractable, without status migrainosus: Secondary | ICD-10-CM | POA: Insufficient documentation

## 2022-09-14 DIAGNOSIS — E119 Type 2 diabetes mellitus without complications: Secondary | ICD-10-CM | POA: Insufficient documentation

## 2022-09-14 LAB — URINALYSIS, ROUTINE W REFLEX MICROSCOPIC
Bacteria, UA: NONE SEEN
Bilirubin Urine: NEGATIVE
Glucose, UA: NEGATIVE mg/dL
Ketones, ur: 80 mg/dL — AB
Leukocytes,Ua: NEGATIVE
Nitrite: NEGATIVE
Protein, ur: NEGATIVE mg/dL
Specific Gravity, Urine: 1.016 (ref 1.005–1.030)
pH: 5 (ref 5.0–8.0)

## 2022-09-14 LAB — VITAMIN B12: Vitamin B-12: 252 pg/mL (ref 180–914)

## 2022-09-14 LAB — RAPID URINE DRUG SCREEN, HOSP PERFORMED
Amphetamines: NOT DETECTED
Barbiturates: NOT DETECTED
Benzodiazepines: NOT DETECTED
Cocaine: NOT DETECTED
Opiates: NOT DETECTED
Tetrahydrocannabinol: NOT DETECTED

## 2022-09-14 LAB — CBG MONITORING, ED: Glucose-Capillary: 309 mg/dL — ABNORMAL HIGH (ref 70–99)

## 2022-09-14 LAB — SARS CORONAVIRUS 2 BY RT PCR: SARS Coronavirus 2 by RT PCR: NEGATIVE

## 2022-09-14 LAB — HEMOGLOBIN A1C
Hgb A1c MFr Bld: 9.7 % — ABNORMAL HIGH (ref 4.8–5.6)
Mean Plasma Glucose: 232 mg/dL

## 2022-09-14 LAB — TSH: TSH: 1.14 u[IU]/mL (ref 0.350–4.500)

## 2022-09-14 LAB — FOLATE: Folate: 10.1 ng/mL (ref >5.9)

## 2022-09-14 LAB — RPR: RPR Ser Ql: NONREACTIVE

## 2022-09-14 MED ORDER — AMLODIPINE BESYLATE 5 MG PO TABS
10.0000 mg | ORAL_TABLET | Freq: Every day | ORAL | Status: DC
  Administered 2022-09-14: 10 mg via ORAL
  Filled 2022-09-14: qty 2

## 2022-09-14 MED ORDER — PANTOPRAZOLE SODIUM 40 MG PO TBEC
40.0000 mg | DELAYED_RELEASE_TABLET | Freq: Every day | ORAL | Status: DC
  Administered 2022-09-14: 40 mg via ORAL
  Filled 2022-09-14: qty 1

## 2022-09-14 MED ORDER — INSULIN ASPART 100 UNIT/ML IJ SOLN
0.0000 [IU] | Freq: Three times a day (TID) | INTRAMUSCULAR | Status: DC
  Administered 2022-09-14: 11 [IU] via SUBCUTANEOUS

## 2022-09-14 MED ORDER — INSULIN ASPART PROT & ASPART (70-30 MIX) 100 UNIT/ML ~~LOC~~ SUSP: 0.0000 [IU] | Freq: Three times a day (TID) | SUBCUTANEOUS | Status: DC

## 2022-09-14 MED ORDER — ONDANSETRON 4 MG PO TBDP: 4.0000 mg | ORAL_TABLET | Freq: Three times a day (TID) | ORAL | Status: DC | PRN

## 2022-09-14 MED ORDER — METOCLOPRAMIDE HCL 10 MG PO TABS: 5.0000 mg | ORAL_TABLET | Freq: Four times a day (QID) | ORAL | Status: DC | PRN

## 2022-09-14 MED ORDER — ACETAMINOPHEN 325 MG PO TABS
650.0000 mg | ORAL_TABLET | Freq: Four times a day (QID) | ORAL | Status: DC | PRN
  Administered 2022-09-14: 650 mg via ORAL
  Filled 2022-09-14: qty 2

## 2022-09-14 MED ORDER — METRONIDAZOLE 500 MG PO TABS
250.0000 mg | ORAL_TABLET | Freq: Four times a day (QID) | ORAL | Status: DC
  Administered 2022-09-14: 250 mg via ORAL
  Filled 2022-09-14: qty 1

## 2022-09-14 MED ORDER — TAMSULOSIN HCL 0.4 MG PO CAPS
0.4000 mg | ORAL_CAPSULE | Freq: Every day | ORAL | Status: DC
  Administered 2022-09-14: 0.4 mg via ORAL
  Filled 2022-09-14: qty 1

## 2022-09-14 MED ORDER — LISINOPRIL 2.5 MG PO TABS
5.0000 mg | ORAL_TABLET | Freq: Every day | ORAL | Status: DC
  Administered 2022-09-14: 5 mg via ORAL
  Filled 2022-09-14: qty 2

## 2022-09-14 MED ORDER — ROSUVASTATIN CALCIUM 5 MG PO TABS
40.0000 mg | ORAL_TABLET | Freq: Every day | ORAL | Status: DC
  Administered 2022-09-14: 40 mg via ORAL
  Filled 2022-09-14: qty 8

## 2022-09-14 MED ORDER — METHADONE HCL 10 MG PO TABS
10.0000 mg | ORAL_TABLET | Freq: Three times a day (TID) | ORAL | Status: DC
  Administered 2022-09-14: 10 mg via ORAL
  Filled 2022-09-14: qty 1

## 2022-09-14 MED ORDER — TETRACYCLINE HCL 250 MG PO CAPS
500.0000 mg | ORAL_CAPSULE | Freq: Four times a day (QID) | ORAL | Status: DC
  Administered 2022-09-14: 500 mg via ORAL
  Filled 2022-09-14 (×4): qty 2

## 2022-09-14 NOTE — ED Notes (Signed)
Pt w/ difficulty urinating. Attempted multiple times w/ urinal. Ambulated pt to bathroom w/ walker and 2 standby assist. Pt requires supervision w/ walker and frequent reminders. Pt reports he was able to "pee a little" while in the bathroom. Notified Melina Copa MD.

## 2022-09-14 NOTE — Discharge Instructions (Addendum)
You were seen in the emergency department for continued confusion and unsteady gait.  You had lab work analysis CAT scan of your head and MRI of your brain that did not show a definite reversible cause.  Please follow-up with your doctors at the [place].  Return to the emergency department if any worsening or concerning symptoms

## 2022-09-14 NOTE — ED Notes (Signed)
Son at bedside at this time.

## 2022-09-14 NOTE — ED Notes (Signed)
RN and sitter assisted patient to bedside commode. Patient states he is unable to go at this time.

## 2022-09-14 NOTE — ED Notes (Signed)
Patient vomited shortly after eating lunch. This RN did not see any pills in vomit.
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
## 2022-09-14 NOTE — Progress Notes (Signed)
CSW left a message with patterns son to go over the plan of care. CSW cannot assist patient with SNF placement through the emergency room due to patient having medicare A/B for insurance. Patient is not under [facility] and would require patient to be admitted to the hospital for a 3 night inpatient stay to be considered for SNF placement. Patient also has no medicaid listed for long term care. Patients son would have to pay out of pocket for any home care, assisted living, and skilled nursing. Assisted living prices can range from 3,000 plus a month and skilled nursing can range from 8,000 plus a month. Home care services can range from 25-30 dollars a hour. [facility] also [facility] to see if patient as any coverage with them.

## 2022-09-14 NOTE — ED Notes (Signed)
To MRI

## 2022-09-14 NOTE — ED Provider Notes (Signed)
Emergency Medicine Observation Re-evaluation Note  Jeremy Medina. is a 72 y.o. male, seen on rounds today.  Pt initially presented to the ED for complaints of Altered Mental Status Currently, the patient is awake not oriented.  Physical Exam  BP 131/84   Pulse (!) 105   Temp 98.3 F (36.8 C) (Oral)   Resp 16   Ht 5\' 10"  (1.778 m)   Wt 102.1 kg   SpO2 98%   BMI 32.30 kg/m  Physical Exam General: No acute distress Cardiac: Well-perfused Lungs: Nonlabored Psych: Cooperative  ED Course / MDM  EKG:EKG Interpretation  Date/Time:  Thursday September 13 2022 20:16:27 EDT Ventricular Rate:  82 PR Interval:  139 QRS Duration: 83 QT Interval:  398 QTC Calculation: 465 R Axis:   71 Text Interpretation: Sinus rhythm Confirmed by Margaretmary Eddy [id number]) on 09/13/2022 9:09:28 PM  I have reviewed the labs performed to date as well as medications administered while in observation.  Recent changes in the last 24 hours include medical evaluation.  Plan  Current plan is for TTS evaluation.  Psychiatry has evaluated him and do not feel needs a psychiatric admission.  Social work has been involved and cannot get him placed.  They have contacted son and he is here to pick patient up.  His plan is to take him to the [place] for further evaluation with a scheduled appointment on Wednesday.  He feels he can adequately care for him at home until then.   Hayden Rasmussen, MD 09/15/22 [phone]

## 2022-09-14 NOTE — ED Notes (Signed)
Called medication reconciliation tech to notify of med rec needing completed so home meds for pt can be ordered.

## 2022-09-14 NOTE — ED Notes (Signed)
Bladder scan volume 144mL

## 2022-09-14 NOTE — ED Notes (Addendum)
Explained to pt importance of staying in bed d/t high fall risk. Pt is unsteady on his feet. Pt unable to verbalize understanding d/t dementia and Ox1. Pt reports he usually uses a cane at home and said a walker when here. Non-skid socks in place, fall risk magnet on door, no door on room so technically door is open, fall risk bracelet on, bed alarm on. No sitter at bedside d/t per staffing, no sitters available.

## 2022-09-14 NOTE — ED Notes (Signed)
Pt back from MRI 

## 2022-09-28 ENCOUNTER — Encounter: Payer: Self-pay | Admitting: Physician Assistant

## 2022-09-28 ENCOUNTER — Other Ambulatory Visit: Payer: Self-pay

## 2022-09-28 ENCOUNTER — Observation Stay (HOSPITAL_COMMUNITY): Payer: No Typology Code available for payment source

## 2022-09-28 ENCOUNTER — Encounter (HOSPITAL_COMMUNITY): Payer: Self-pay

## 2022-09-28 ENCOUNTER — Inpatient Hospital Stay (HOSPITAL_COMMUNITY)
Admission: EM | Admit: 2022-09-28 | Discharge: 2022-11-11 | DRG: 026 | Disposition: A | Discharge status: Hospice | Payer: No Typology Code available for payment source | Attending: Internal Medicine | Admitting: Internal Medicine

## 2022-09-28 ENCOUNTER — Emergency Department (HOSPITAL_COMMUNITY): Payer: No Typology Code available for payment source

## 2022-09-28 DIAGNOSIS — K8689 Other specified diseases of pancreas: Secondary | ICD-10-CM | POA: Diagnosis present

## 2022-09-28 DIAGNOSIS — Z66 Do not resuscitate: Secondary | ICD-10-CM | POA: Diagnosis not present

## 2022-09-28 DIAGNOSIS — F0394 Unspecified dementia, unspecified severity, with anxiety: Secondary | ICD-10-CM | POA: Diagnosis present

## 2022-09-28 DIAGNOSIS — E785 Hyperlipidemia, unspecified: Secondary | ICD-10-CM | POA: Diagnosis present

## 2022-09-28 DIAGNOSIS — R569 Unspecified convulsions: Secondary | ICD-10-CM

## 2022-09-28 DIAGNOSIS — R1110 Vomiting, unspecified: Secondary | ICD-10-CM | POA: Insufficient documentation

## 2022-09-28 DIAGNOSIS — Z515 Encounter for palliative care: Secondary | ICD-10-CM

## 2022-09-28 DIAGNOSIS — R296 Repeated falls: Secondary | ICD-10-CM | POA: Diagnosis present

## 2022-09-28 DIAGNOSIS — C7949 Secondary malignant neoplasm of other parts of nervous system: Secondary | ICD-10-CM

## 2022-09-28 DIAGNOSIS — Z794 Long term (current) use of insulin: Secondary | ICD-10-CM

## 2022-09-28 DIAGNOSIS — G030 Nonpyogenic meningitis: Secondary | ICD-10-CM | POA: Diagnosis present

## 2022-09-28 DIAGNOSIS — R1310 Dysphagia, unspecified: Secondary | ICD-10-CM

## 2022-09-28 DIAGNOSIS — G894 Chronic pain syndrome: Secondary | ICD-10-CM | POA: Diagnosis present

## 2022-09-28 DIAGNOSIS — T380X5A Adverse effect of glucocorticoids and synthetic analogues, initial encounter: Secondary | ICD-10-CM | POA: Diagnosis not present

## 2022-09-28 DIAGNOSIS — D62 Acute posthemorrhagic anemia: Secondary | ICD-10-CM | POA: Diagnosis not present

## 2022-09-28 DIAGNOSIS — Z8249 Family history of ischemic heart disease and other diseases of the circulatory system: Secondary | ICD-10-CM

## 2022-09-28 DIAGNOSIS — Z83438 Family history of other disorder of lipoprotein metabolism and other lipidemia: Secondary | ICD-10-CM

## 2022-09-28 DIAGNOSIS — F17210 Nicotine dependence, cigarettes, uncomplicated: Secondary | ICD-10-CM | POA: Diagnosis present

## 2022-09-28 DIAGNOSIS — M47812 Spondylosis without myelopathy or radiculopathy, cervical region: Secondary | ICD-10-CM | POA: Diagnosis present

## 2022-09-28 DIAGNOSIS — F0393 Unspecified dementia, unspecified severity, with mood disturbance: Secondary | ICD-10-CM | POA: Diagnosis present

## 2022-09-28 DIAGNOSIS — I10 Essential (primary) hypertension: Secondary | ICD-10-CM | POA: Diagnosis present

## 2022-09-28 DIAGNOSIS — F32A Depression, unspecified: Secondary | ICD-10-CM | POA: Diagnosis present

## 2022-09-28 DIAGNOSIS — Z6832 Body mass index (BMI) 32.0-32.9, adult: Secondary | ICD-10-CM

## 2022-09-28 DIAGNOSIS — Z781 Physical restraint status: Secondary | ICD-10-CM

## 2022-09-28 DIAGNOSIS — N179 Acute kidney failure, unspecified: Secondary | ICD-10-CM | POA: Diagnosis not present

## 2022-09-28 DIAGNOSIS — R4701 Aphasia: Secondary | ICD-10-CM | POA: Diagnosis present

## 2022-09-28 DIAGNOSIS — Z833 Family history of diabetes mellitus: Secondary | ICD-10-CM

## 2022-09-28 DIAGNOSIS — E1165 Type 2 diabetes mellitus with hyperglycemia: Secondary | ICD-10-CM | POA: Diagnosis present

## 2022-09-28 DIAGNOSIS — F4310 Post-traumatic stress disorder, unspecified: Secondary | ICD-10-CM | POA: Diagnosis present

## 2022-09-28 DIAGNOSIS — E669 Obesity, unspecified: Secondary | ICD-10-CM | POA: Insufficient documentation

## 2022-09-28 DIAGNOSIS — E538 Deficiency of other specified B group vitamins: Secondary | ICD-10-CM | POA: Insufficient documentation

## 2022-09-28 DIAGNOSIS — E860 Dehydration: Secondary | ICD-10-CM | POA: Diagnosis present

## 2022-09-28 DIAGNOSIS — Z1152 Encounter for screening for COVID-19: Secondary | ICD-10-CM

## 2022-09-28 DIAGNOSIS — E1140 Type 2 diabetes mellitus with diabetic neuropathy, unspecified: Secondary | ICD-10-CM | POA: Diagnosis present

## 2022-09-28 DIAGNOSIS — R32 Unspecified urinary incontinence: Secondary | ICD-10-CM | POA: Diagnosis present

## 2022-09-28 DIAGNOSIS — C712 Malignant neoplasm of temporal lobe: Secondary | ICD-10-CM | POA: Diagnosis not present

## 2022-09-28 DIAGNOSIS — E11649 Type 2 diabetes mellitus with hypoglycemia without coma: Secondary | ICD-10-CM | POA: Diagnosis not present

## 2022-09-28 DIAGNOSIS — G9389 Other specified disorders of brain: Secondary | ICD-10-CM | POA: Diagnosis present

## 2022-09-28 DIAGNOSIS — R159 Full incontinence of feces: Secondary | ICD-10-CM | POA: Diagnosis not present

## 2022-09-28 DIAGNOSIS — G912 (Idiopathic) normal pressure hydrocephalus: Secondary | ICD-10-CM | POA: Diagnosis present

## 2022-09-28 DIAGNOSIS — K5641 Fecal impaction: Secondary | ICD-10-CM

## 2022-09-28 DIAGNOSIS — E876 Hypokalemia: Secondary | ICD-10-CM | POA: Insufficient documentation

## 2022-09-28 DIAGNOSIS — R4182 Altered mental status, unspecified: Secondary | ICD-10-CM

## 2022-09-28 DIAGNOSIS — Z79891 Long term (current) use of opiate analgesic: Secondary | ICD-10-CM

## 2022-09-28 DIAGNOSIS — K219 Gastro-esophageal reflux disease without esophagitis: Secondary | ICD-10-CM | POA: Diagnosis present

## 2022-09-28 DIAGNOSIS — Z91030 Bee allergy status: Secondary | ICD-10-CM

## 2022-09-28 DIAGNOSIS — Z79899 Other long term (current) drug therapy: Secondary | ICD-10-CM

## 2022-09-28 DIAGNOSIS — K760 Fatty (change of) liver, not elsewhere classified: Secondary | ICD-10-CM | POA: Diagnosis present

## 2022-09-28 DIAGNOSIS — G910 Communicating hydrocephalus: Secondary | ICD-10-CM | POA: Diagnosis present

## 2022-09-28 DIAGNOSIS — R7401 Elevation of levels of liver transaminase levels: Secondary | ICD-10-CM | POA: Insufficient documentation

## 2022-09-28 DIAGNOSIS — K5900 Constipation, unspecified: Secondary | ICD-10-CM

## 2022-09-28 DIAGNOSIS — F419 Anxiety disorder, unspecified: Secondary | ICD-10-CM | POA: Insufficient documentation

## 2022-09-28 DIAGNOSIS — K625 Hemorrhage of anus and rectum: Secondary | ICD-10-CM

## 2022-09-28 DIAGNOSIS — C719 Malignant neoplasm of brain, unspecified: Secondary | ICD-10-CM

## 2022-09-28 DIAGNOSIS — R933 Abnormal findings on diagnostic imaging of other parts of digestive tract: Secondary | ICD-10-CM

## 2022-09-28 DIAGNOSIS — G40909 Epilepsy, unspecified, not intractable, without status epilepticus: Secondary | ICD-10-CM | POA: Diagnosis not present

## 2022-09-28 DIAGNOSIS — K633 Ulcer of intestine: Secondary | ICD-10-CM | POA: Diagnosis present

## 2022-09-28 DIAGNOSIS — G9340 Encephalopathy, unspecified: Secondary | ICD-10-CM | POA: Diagnosis present

## 2022-09-28 DIAGNOSIS — Z818 Family history of other mental and behavioral disorders: Secondary | ICD-10-CM

## 2022-09-28 DIAGNOSIS — E119 Type 2 diabetes mellitus without complications: Secondary | ICD-10-CM

## 2022-09-28 DIAGNOSIS — D72829 Elevated white blood cell count, unspecified: Secondary | ICD-10-CM | POA: Diagnosis present

## 2022-09-28 LAB — CBC
HCT: 48.4 % (ref 39.0–52.0)
Hemoglobin: 15.8 g/dL (ref 13.0–17.0)
MCH: 28.2 pg (ref 26.0–34.0)
MCHC: 32.6 g/dL (ref 30.0–36.0)
MCV: 86.3 fL (ref 80.0–100.0)
Platelets: 230 10*3/uL (ref 150–400)
RBC: 5.61 MIL/uL (ref 4.22–5.81)
RDW: 14.6 % (ref 11.5–15.5)
WBC: 21 10*3/uL — ABNORMAL HIGH (ref 4.0–10.5)
nRBC: 0 % (ref 0.0–0.2)

## 2022-09-28 LAB — URINALYSIS, ROUTINE W REFLEX MICROSCOPIC
Bacteria, UA: NONE SEEN
Bilirubin Urine: NEGATIVE
Glucose, UA: >=500 mg/dL — AB
Ketones, ur: 5 mg/dL — AB
Leukocytes,Ua: NEGATIVE
Nitrite: NEGATIVE
Protein, ur: 30 mg/dL — AB
RBC / HPF: >50 RBC/hpf (ref 0–5)
Specific Gravity, Urine: 1.02 (ref 1.005–1.030)
pH: 5 (ref 5.0–8.0)

## 2022-09-28 LAB — COMPREHENSIVE METABOLIC PANEL
ALT: 53 U/L — ABNORMAL HIGH (ref 0–44)
AST: 54 U/L — ABNORMAL HIGH (ref 15–41)
Albumin: 4.2 g/dL (ref 3.5–5.0)
Alkaline Phosphatase: 70 U/L (ref 38–126)
Anion gap: 20 — ABNORMAL HIGH (ref 5–15)
BUN: 57 mg/dL — ABNORMAL HIGH (ref 8–23)
CO2: 22 mmol/L (ref 22–32)
Calcium: 10.8 mg/dL — ABNORMAL HIGH (ref 8.9–10.3)
Chloride: 99 mmol/L (ref 98–111)
Creatinine, Ser: 2.32 mg/dL — ABNORMAL HIGH (ref 0.61–1.24)
GFR, Estimated: 29 mL/min — ABNORMAL LOW (ref >60)
Glucose, Bld: 146 mg/dL — ABNORMAL HIGH (ref 70–99)
Potassium: 3.8 mmol/L (ref 3.5–5.1)
Sodium: 141 mmol/L (ref 135–145)
Total Bilirubin: 0.9 mg/dL (ref 0.3–1.2)
Total Protein: 7.9 g/dL (ref 6.5–8.1)

## 2022-09-28 LAB — AMMONIA: Ammonia: 61 umol/L — ABNORMAL HIGH (ref 9–35)

## 2022-09-28 LAB — DIFFERENTIAL
Abs Immature Granulocytes: 0.12 10*3/uL — ABNORMAL HIGH (ref 0.00–0.07)
Basophils Absolute: 0 10*3/uL (ref 0.0–0.1)
Basophils Relative: 0 %
Eosinophils Absolute: 0 10*3/uL (ref 0.0–0.5)
Eosinophils Relative: 0 %
Immature Granulocytes: 1 %
Lymphocytes Relative: 8 %
Lymphs Abs: 1.8 10*3/uL (ref 0.7–4.0)
Monocytes Absolute: 1.4 10*3/uL — ABNORMAL HIGH (ref 0.1–1.0)
Monocytes Relative: 7 %
Neutro Abs: 17.6 10*3/uL — ABNORMAL HIGH (ref 1.7–7.7)
Neutrophils Relative %: 84 %

## 2022-09-28 LAB — APTT: aPTT: 30 seconds (ref 24–36)

## 2022-09-28 LAB — CBG MONITORING, ED
Glucose-Capillary: 67 mg/dL — ABNORMAL LOW (ref 70–99)
Glucose-Capillary: 151 mg/dL — ABNORMAL HIGH (ref 70–99)

## 2022-09-28 LAB — TSH: TSH: 1.413 u[IU]/mL (ref 0.350–4.500)

## 2022-09-28 LAB — I-STAT CHEM 8, ED
BUN: 57 mg/dL — ABNORMAL HIGH (ref 8–23)
Calcium, Ion: 1.2 mmol/L (ref 1.15–1.40)
Chloride: 103 mmol/L (ref 98–111)
Creatinine, Ser: 2.2 mg/dL — ABNORMAL HIGH (ref 0.61–1.24)
Glucose, Bld: 151 mg/dL — ABNORMAL HIGH (ref 70–99)
HCT: 50 % (ref 39.0–52.0)
Hemoglobin: 17 g/dL (ref 13.0–17.0)
Potassium: 3.7 mmol/L (ref 3.5–5.1)
Sodium: 141 mmol/L (ref 135–145)
TCO2: 25 mmol/L (ref 22–32)

## 2022-09-28 LAB — PROTIME-INR
INR: 1.2 (ref 0.8–1.2)
Prothrombin Time: 14.8 seconds (ref 11.4–15.2)

## 2022-09-28 LAB — VITAMIN B12: Vitamin B-12: 367 pg/mL (ref 180–914)

## 2022-09-28 LAB — ETHANOL: Alcohol, Ethyl (B): <10 mg/dL (ref <10)

## 2022-09-28 MED ORDER — SENNOSIDES-DOCUSATE SODIUM 8.6-50 MG PO TABS: 1.0000 | ORAL_TABLET | Freq: Every evening | ORAL | Status: DC | PRN

## 2022-09-28 MED ORDER — THIAMINE HCL 100 MG/ML IJ SOLN
250.0000 mg | INTRAVENOUS | Status: AC
  Administered 2022-09-30 – 2022-10-04 (×5): 250 mg via INTRAVENOUS
  Filled 2022-09-28 (×5): qty 2.5

## 2022-09-28 MED ORDER — AMLODIPINE BESYLATE 10 MG PO TABS
10.0000 mg | ORAL_TABLET | Freq: Every day | ORAL | Status: DC
  Administered 2022-09-29: 10 mg via ORAL
  Filled 2022-09-28: qty 1

## 2022-09-28 MED ORDER — HEPARIN SODIUM (PORCINE) 5000 UNIT/ML IJ SOLN
5000.0000 [IU] | Freq: Three times a day (TID) | INTRAMUSCULAR | Status: DC
  Administered 2022-09-28 – 2022-10-01 (×8): 5000 [IU] via SUBCUTANEOUS
  Filled 2022-09-28 (×7): qty 1

## 2022-09-28 MED ORDER — LACTATED RINGERS IV BOLUS
1000.0000 mL | Freq: Once | INTRAVENOUS | Status: AC
  Administered 2022-09-28: 1000 mL via INTRAVENOUS

## 2022-09-28 MED ORDER — DEXTROSE 50 % IV SOLN
12.5000 g | INTRAVENOUS | Status: AC
  Administered 2022-09-28: 12.5 g via INTRAVENOUS
  Filled 2022-09-28: qty 50

## 2022-09-28 MED ORDER — LACTATED RINGERS IV SOLN: INTRAVENOUS | Status: DC

## 2022-09-28 MED ORDER — ACETAMINOPHEN 325 MG PO TABS: 650.0000 mg | ORAL_TABLET | Freq: Four times a day (QID) | ORAL | Status: DC | PRN

## 2022-09-28 MED ORDER — THIAMINE HCL 100 MG/ML IJ SOLN
500.0000 mg | Freq: Three times a day (TID) | INTRAVENOUS | Status: AC
  Administered 2022-09-28 – 2022-09-30 (×6): 500 mg via INTRAVENOUS
  Filled 2022-09-28 (×6): qty 5

## 2022-09-28 MED ORDER — SODIUM CHLORIDE 0.9% FLUSH
3.0000 mL | Freq: Two times a day (BID) | INTRAVENOUS | Status: DC
  Administered 2022-09-29 – 2022-11-11 (×74): 3 mL via INTRAVENOUS

## 2022-09-28 MED ORDER — ROSUVASTATIN CALCIUM 20 MG PO TABS
40.0000 mg | ORAL_TABLET | Freq: Every day | ORAL | Status: DC
  Administered 2022-09-29: 40 mg via ORAL
  Filled 2022-09-28: qty 2

## 2022-09-28 MED ORDER — INSULIN ASPART 100 UNIT/ML IJ SOLN
0.0000 [IU] | Freq: Three times a day (TID) | INTRAMUSCULAR | Status: DC
  Administered 2022-09-29 – 2022-09-30 (×2): 2 [IU] via SUBCUTANEOUS
  Administered 2022-09-30: 3 [IU] via SUBCUTANEOUS
  Administered 2022-09-30: 5 [IU] via SUBCUTANEOUS
  Administered 2022-10-01 (×3): 2 [IU] via SUBCUTANEOUS
  Administered 2022-10-02: 5 [IU] via SUBCUTANEOUS
  Administered 2022-10-02 – 2022-10-03 (×3): 2 [IU] via SUBCUTANEOUS
  Administered 2022-10-03: 3 [IU] via SUBCUTANEOUS
  Administered 2022-10-03: 7 [IU] via SUBCUTANEOUS
  Administered 2022-10-04 (×2): 3 [IU] via SUBCUTANEOUS
  Administered 2022-10-04: 2 [IU] via SUBCUTANEOUS
  Administered 2022-10-05 (×2): 3 [IU] via SUBCUTANEOUS
  Administered 2022-10-06: 5 [IU] via SUBCUTANEOUS
  Administered 2022-10-06 (×2): 2 [IU] via SUBCUTANEOUS

## 2022-09-28 MED ORDER — METHADONE HCL 10 MG PO TABS
10.0000 mg | ORAL_TABLET | Freq: Three times a day (TID) | ORAL | Status: DC
  Administered 2022-09-29 (×2): 10 mg via ORAL
  Filled 2022-09-28 (×2): qty 1

## 2022-09-28 MED ORDER — SODIUM CHLORIDE 0.9% FLUSH
3.0000 mL | Freq: Once | INTRAVENOUS | Status: AC
  Administered 2022-09-28: 3 mL via INTRAVENOUS

## 2022-09-28 MED ORDER — ONDANSETRON HCL 4 MG/2ML IJ SOLN
4.0000 mg | Freq: Four times a day (QID) | INTRAMUSCULAR | Status: DC | PRN
  Administered 2022-10-11 – 2022-10-23 (×4): 4 mg via INTRAVENOUS
  Filled 2022-09-28 (×4): qty 2

## 2022-09-28 MED ORDER — ACETAMINOPHEN 650 MG RE SUPP: 650.0000 mg | Freq: Four times a day (QID) | RECTAL | Status: DC | PRN

## 2022-09-28 MED ORDER — ONDANSETRON HCL 4 MG PO TABS: 4.0000 mg | ORAL_TABLET | Freq: Four times a day (QID) | ORAL | Status: DC | PRN

## 2022-09-28 MED ORDER — LORAZEPAM 1 MG PO TABS
1.0000 mg | ORAL_TABLET | Freq: Every day | ORAL | Status: DC
  Administered 2022-09-29: 1 mg via ORAL
  Filled 2022-09-28: qty 1

## 2022-09-28 NOTE — ED Provider Notes (Signed)
[place] EMERGENCY DEPARTMENT AT [facility] Provider Note  Medical Decision Making   HPI: Jeremy Medina. is a 72 y.o. male with history perinent for type 2 diabetes, PTSD, chronic pain on methadone per son, hypertension, hyperlipidemia who presents complaining of altered mental status. Patient arrived via POV accompanied by son.  History provided by patient's son.  No interpreter required for this encounter.  Son reports that in January 2024, patient was a conversational provider send who was able to perform their ADLs independently.  Reports that in the interim patient has had a progressive decline.  States that he is now not able to perform his ADLs independently, has become more more withdrawn.  Reports that over the last week he has had poor p.o. intake, will occasionally have some bites of foods and part of a Ensure shake, however is unable to take a full meal due to disinterest.  Report that patient had a MRI performed at the [facility] concerning for normal pressure hydrocephalus, and that they had a follow-up today with a neurosurgeon who recommended presentation to the ED, per son's understanding of presentation to the ED was for possible LP to see if patient had improvement in mental status.  ROS: As per HPI. Please see MAR for complete past medical history, surgical history, and social history.   Physical exam is pertinent for mild tachycardia, mildly delayed cap refill, GCS 12, no other focal findings on exam.   The differential includes but is not limited to normal pressure hydrocephalus, infection, AKI, electrolyte derangement, dementia, depression, metabolic encephalopathy.  Additional history obtained from: Chart review External records from outside source obtained and reviewed including: Reviewed patient's most recent ED and admission notes which do note that patient was evaluated with MRI at the [facility] which was concerning for normal pressure hydrocephalus, no  neurosurgical notes documented in chart  ED provider interpretation of ECG: Rate 117, sinus rhythm, T wave flattening in lead V1, otherwise no ST elevations or depressions.  ED provider interpretation of radiology/imaging: CT head without ICH or display skull fracture, mildly enlarged ventricles.  CT C-spine without displaced fracture or dislocation.  Chest x-ray without focal airspace opacification, cardiomediastinal silhouette derangement, pneumothorax, pleural effusion, bony derangement  Labs ordered were interpreted by myself as well as my attending and were incorporated into the medical decision making process for this patient.  ED provider interpretation of labs: CBC with leukocytosis to 21 with left shift.  No anemia or thrombocytopenia.  Ethanol negative.  CMP with AKI to 2.3 from baseline of approximately 1, BUN symmetrically elevated.  Anion gap mildly elevated.  UA without UTI.  Chem-8 additionally demonstrates AKI.  PT, PTT, INR WNL.  Ammonia mildly elevated.  Interventions: LR bolus  See the EMR for full details regarding lab and imaging results.  On exam, patient is altered, has had progressive decline per son.  No focal findings on exam to indicate infectious source or localizing deficit.  Given unable to see outpatient neurosurgery notes, consulted neurosurgical APP on-call, Mey[place].  Personally discussed the patient's case.  After reviewing the documentation in the chart, per neurosurgical APP, patient was seen in the emergency department, and there was some concern for normal pressure hydrocephalus, and patient and family were advised to present to the ED for consideration for medical admission with neurology consult.  On review of patient's labs, patient does have nonspecific leukocytosis, patient does not have any localizing symptoms for infection on exam, no evidence of pneumonia on chest x-ray,  no evidence of UTI on urine infection, no meningismus, no abdominal tenderness,  additionally patient was afebrile, normotensive, doubt sepsis.  Unclear etiology.  Patient does have AKI on labs, this is consistent with patient's decreased capillary refill and son's report that patient has had decreased oral intake over the past several days to 1 week.  Consulted neurology, discussed the patient's case with neurology provider, Dr. Derry Lory.  Due to the nature of the specialist PT/OT and cognitive testing required, per neurology unfortunately normal pressure hydrocephalus testing has to be performed on an outpatient basis.  However, even with patient's inability to undergo normal pressure hydrocephalus testing inpatient, do feel that patient warrants admission for AKI.  Hospitalist consulted for admission for AKI, discussed the patient with the hospitalist on-call, patient accepted to hospital service by Dr. Allena Katz.  No additional acute events while patient was under my care in the ED.   Consults: Neurosurgery, neurology, hospitalists  Disposition: ADMIT: I believe the patient requires admission for further care and management. The patient was admitted to hospitalist. Please see inpatient provider note for additional treatment plan details.   The plan for this patient was discussed with Dr. Fredderick Phenix, who voiced agreement and who oversaw evaluation and treatment of this patient.  Clinical Impression:  1. AKI (acute kidney injury)   2. Altered mental status, unspecified altered mental status type    Admit  Therapies: These medications and interventions were provided for the patient while in the ED. Medications  heparin injection 5,000 Units (5,000 Units Subcutaneous Given 09/28/22 2217)  sodium chloride flush (NS) 0.9 % injection 3 mL (has no administration in time range)  acetaminophen (TYLENOL) tablet 650 mg (has no administration in time range)    Or  acetaminophen (TYLENOL) suppository 650 mg (has no administration in time range)  lactated ringers infusion ( Intravenous  New Bag/Given 09/28/22 2213)  ondansetron (ZOFRAN) tablet 4 mg (has no administration in time range)    Or  ondansetron (ZOFRAN) injection 4 mg (has no administration in time range)  senna-docusate (Senokot-S) tablet 1 tablet (has no administration in time range)  thiamine (VITAMIN B1) 500 mg in sodium chloride 0.9 % 50 mL IVPB (500 mg Intravenous New Bag/Given 09/28/22 2216)    Followed by  thiamine (VITAMIN B1) 250 mg in sodium chloride 0.9 % 50 mL IVPB (has no administration in time range)  insulin aspart (novoLOG) injection 0-9 Units (has no administration in time range)  amLODipine (NORVASC) tablet 10 mg (has no administration in time range)  LORazepam (ATIVAN) tablet 1 mg (has no administration in time range)  methadone (DOLOPHINE) tablet 10 mg (has no administration in time range)  rosuvastatin (CRESTOR) tablet 40 mg (has no administration in time range)  sodium chloride flush (NS) 0.9 % injection 3 mL (3 mLs Intravenous Given 09/28/22 1626)  lactated ringers bolus 1,000 mL (0 mLs Intravenous Stopped 09/28/22 2236)  dextrose 50 % solution 12.5 g (12.5 g Intravenous Given 09/28/22 2229)    MDM generated using voice dictation software and may contain dictation errors.  Please contact me for any clarification or with any questions.  Clinical Complexity A medically appropriate history, review of systems, and physical exam was performed.  Collateral history obtained from: son, chart review I personally reviewed the labs, EKG, imaging as discussed above. Patient's presentation is most consistent with acute complicated illness / injury requiring diagnostic workup Considered and ruled out life and body threatening conditions  Treatment: Hospitalization Patient's ability to provide his own history increases the  complexity of managing their presentation. Medications: Prescription Discussed patient's care with providers from the following different specialties: Neurology neurosurgery,  hospitalists   Physical Exam   ED Triage Vitals  Enc Vitals Group     BP 09/28/22 1540 (!) 139/117     Pulse Rate 09/28/22 1540 (!) 109     Resp 09/28/22 1540 18     Temp 09/28/22 1540 (!) 97.4 F (36.3 C)     Temp Source 09/28/22 1547 Oral     SpO2 09/28/22 1540 95 %     Weight --      Height --      Head Circumference --      Peak Flow --      Pain Score --      Pain Loc --      Pain Edu? --      Excl. in GC? --      Physical Exam Vitals and nursing note reviewed.  Constitutional:      General: He is not in acute distress.    Appearance: He is well-developed.  HENT:     Head: Normocephalic and atraumatic.  Eyes:     Conjunctiva/sclera: Conjunctivae normal.  Cardiovascular:     Rate and Rhythm: Regular rhythm. Tachycardia present.     Heart sounds: No murmur heard. Pulmonary:     Effort: Pulmonary effort is normal. No respiratory distress.     Breath sounds: Normal breath sounds.  Abdominal:     Palpations: Abdomen is soft.     Tenderness: There is no abdominal tenderness.  Musculoskeletal:        General: No swelling or deformity.     Cervical back: Neck supple.     Right lower leg: No edema.     Left lower leg: No edema.  Skin:    General: Skin is warm and dry.     Capillary Refill: Capillary refill takes 2 to 3 seconds.  Neurological:     Mental Status: He is alert. He is disoriented.     GCS: GCS eye subscore is 3. GCS verbal subscore is 3. GCS motor subscore is 6.  Psychiatric:        Mood and Affect: Mood normal.       Procedure Note  Procedures  US RENAL  Final Result    DG CHEST PORT 1 VIEW  Final Result    CT HEAD WO CONTRAST  Final Result    CT Cervical Spine Wo Contrast  Final Result    MR Venogram Head    (Results Pending)    L. Erlene Quan, MD Emergency Medicine, PGY-2   Curley Spice, MD 09/28/22 1610    Rolan Bucco, MD 10/02/22 [phone]

## 2022-09-28 NOTE — ED Notes (Signed)
PT son [name] [phone] please call with any updates
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
## 2022-09-28 NOTE — ED Provider Triage Note (Signed)
Emergency Medicine Provider Triage Evaluation Note  Jeremy Medina. , a 72 y.o. male  was evaluated in triage.  Patient has an extensive medical history.  Per son he has been declining since February.  He has been told that he has some degree of hydrocephalus and was at the specialist today and they said that he needed to come to the emergency department to have an LP.  Patient is unresponsive.  Does not follow commands.  Is not oriented to situation.  Son reports that he has had several falls, most recently this morning where he may have struck his head in the bathroom.  For the past week he has not been following commands, responding to his son and has been acting abnormally.  Review of Systems  Positive:  Negative:   Physical Exam  BP 93/69 (BP Location: Left Arm)   Pulse (!) 116   Temp 97.9 F (36.6 C) (Oral)   Resp 18   SpO2 95%  Gen:   Awake, no distress   Resp:  Normal effort  MSK:   Moves extremities without difficulty  Other:  Patient pale and confused.  Will not follow commands.  Unable to get a good neurologic exam.  Moving all extremities  Medical Decision Making  Medically screening exam initiated at 3:51 PM.  Appropriate orders placed.  Richarda Overlie. was informed that the remainder of the evaluation will be completed by another provider, this initial triage assessment does not replace that evaluation, and the importance of remaining in the ED until their evaluation is complete.     Saddie Benders, PA-C 09/28/22 1553

## 2022-09-28 NOTE — ED Notes (Signed)
Vishal Patel notified of 67 BGL. Orders placed.

## 2022-09-28 NOTE — ED Triage Notes (Signed)
Per son, starting in February patient has been nauseous, unable to respond, confusion, lost bowel and bladder control, has had 6 ER visits, was told possible hydrocephalus.

## 2022-09-28 NOTE — Hospital Course (Addendum)
72 year old M with PMH of DM-2, HTN, pancreatic insufficiency, GERD, chronic pain on methadone, PTSD and anxiety sent to ED from neurosurgery clinic for progressive subacute encephalopathy on 4/12.  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  He was discharged home with neurology and neurosurgery follow-up.  CT head, MRI brain without contrast and MRV without acute finding.  Neurology consulted.  No improvement with IV thiamine.  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   LP on 4/17 and 4/25 didn't pinpoint etiology.  CSF studies including CJD unrevealing.  He also had CT chest, abdomen and pelvis that was negative for malignancy.  Neurosurgery indicated that patient is poor candidate for shunt placement on 4/30.  Neurology recommended tissue biopsy. Patient underwent right craniotomy for excision of temporal lobe mass and ventriculocisternostomy on 5/14. Developed significant seizures with persistent postictal encephalopathy requiring transfer to the ICU on 5/17.  Transferred out on 5/19.  On a higher dose of Keppra.  LTM EEG performed. 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  Neuro-oncology recommended comfort approach going forward. 5/20 based on discussion with the son currently DNR.  Son agrees with focusing on comfort in principal.

## 2022-09-28 NOTE — ED Notes (Addendum)
Son at bedside reports that pt was seen and was told to come to ed for "spinal tap" for encephalopathy Per son pt has hx of possible liver of kidney problems poor historian reports that he doesn't currently drink etoh but did years ago pt is on methadone Pt unable to follow commands pt will lood at you when name is called

## 2022-09-29 ENCOUNTER — Observation Stay (HOSPITAL_COMMUNITY): Payer: No Typology Code available for payment source

## 2022-09-29 ENCOUNTER — Encounter (HOSPITAL_COMMUNITY): Payer: Self-pay | Admitting: Internal Medicine

## 2022-09-29 DIAGNOSIS — N179 Acute kidney failure, unspecified: Secondary | ICD-10-CM | POA: Diagnosis not present

## 2022-09-29 LAB — COMPREHENSIVE METABOLIC PANEL
ALT: 45 U/L — ABNORMAL HIGH (ref 0–44)
AST: 71 U/L — ABNORMAL HIGH (ref 15–41)
Albumin: 3.7 g/dL (ref 3.5–5.0)
Alkaline Phosphatase: 66 U/L (ref 38–126)
Anion gap: 16 — ABNORMAL HIGH (ref 5–15)
BUN: 55 mg/dL — ABNORMAL HIGH (ref 8–23)
CO2: 26 mmol/L (ref 22–32)
Calcium: 10.5 mg/dL — ABNORMAL HIGH (ref 8.9–10.3)
Chloride: 99 mmol/L (ref 98–111)
Creatinine, Ser: 1.54 mg/dL — ABNORMAL HIGH (ref 0.61–1.24)
GFR, Estimated: 48 mL/min — ABNORMAL LOW (ref >60)
Glucose, Bld: 97 mg/dL (ref 70–99)
Potassium: 3.8 mmol/L (ref 3.5–5.1)
Sodium: 141 mmol/L (ref 135–145)
Total Bilirubin: 0.8 mg/dL (ref 0.3–1.2)
Total Protein: 7.2 g/dL (ref 6.5–8.1)

## 2022-09-29 LAB — CBC
HCT: 45.9 % (ref 39.0–52.0)
Hemoglobin: 15 g/dL (ref 13.0–17.0)
MCH: 27.9 pg (ref 26.0–34.0)
MCHC: 32.7 g/dL (ref 30.0–36.0)
MCV: 85.3 fL (ref 80.0–100.0)
Platelets: 173 10*3/uL (ref 150–400)
RBC: 5.38 MIL/uL (ref 4.22–5.81)
RDW: 14.7 % (ref 11.5–15.5)
WBC: 21.4 10*3/uL — ABNORMAL HIGH (ref 4.0–10.5)
nRBC: 0 % (ref 0.0–0.2)

## 2022-09-29 LAB — PROCALCITONIN: Procalcitonin: <0.1 ng/mL

## 2022-09-29 LAB — HIV ANTIBODY (ROUTINE TESTING W REFLEX): HIV Screen 4th Generation wRfx: NONREACTIVE

## 2022-09-29 LAB — CULTURE, BLOOD (ROUTINE X 2): Culture: NO GROWTH

## 2022-09-29 LAB — MAGNESIUM: Magnesium: 2.1 mg/dL (ref 1.7–2.4)

## 2022-09-29 LAB — GLUCOSE, CAPILLARY
Glucose-Capillary: 98 mg/dL (ref 70–99)
Glucose-Capillary: 109 mg/dL — ABNORMAL HIGH (ref 70–99)
Glucose-Capillary: 186 mg/dL — ABNORMAL HIGH (ref 70–99)
Glucose-Capillary: 279 mg/dL — ABNORMAL HIGH (ref 70–99)

## 2022-09-29 MED ORDER — LORAZEPAM 0.5 MG PO TABS
0.5000 mg | ORAL_TABLET | Freq: Every day | ORAL | Status: DC
  Administered 2022-09-29 – 2022-10-12 (×14): 0.5 mg via ORAL
  Filled 2022-09-29 (×14): qty 1

## 2022-09-29 MED ORDER — CYANOCOBALAMIN 1000 MCG/ML IJ SOLN
1000.0000 ug | Freq: Every day | INTRAMUSCULAR | Status: AC
  Administered 2022-09-29 – 2022-09-30 (×2): 1000 ug via SUBCUTANEOUS
  Filled 2022-09-29 (×2): qty 1

## 2022-09-29 MED ORDER — METHADONE HCL 10 MG PO TABS
5.0000 mg | ORAL_TABLET | Freq: Three times a day (TID) | ORAL | Status: DC
  Administered 2022-09-29 – 2022-09-30 (×3): 5 mg via ORAL
  Filled 2022-09-29 (×3): qty 1

## 2022-09-29 MED ORDER — ACETAMINOPHEN 325 MG PO TABS
650.0000 mg | ORAL_TABLET | Freq: Four times a day (QID) | ORAL | Status: DC | PRN
  Administered 2022-09-30 – 2022-10-25 (×2): 650 mg via ORAL
  Filled 2022-09-29 (×2): qty 2

## 2022-09-29 MED ORDER — PANCRELIPASE (LIP-PROT-AMYL) 12000-38000 UNITS PO CPEP
36000.0000 [IU] | ORAL_CAPSULE | Freq: Three times a day (TID) | ORAL | Status: DC
  Administered 2022-09-30 – 2022-11-11 (×107): 36000 [IU] via ORAL
  Filled 2022-09-29: qty 3
  Filled 2022-09-29 (×7): qty 1
  Filled 2022-09-29: qty 3
  Filled 2022-09-29 (×3): qty 1
  Filled 2022-09-29: qty 3
  Filled 2022-09-29 (×3): qty 1
  Filled 2022-09-29: qty 3
  Filled 2022-09-29 (×3): qty 1
  Filled 2022-09-29: qty 3
  Filled 2022-09-29 (×3): qty 1
  Filled 2022-09-29 (×2): qty 3
  Filled 2022-09-29 (×6): qty 1
  Filled 2022-09-29: qty 3
  Filled 2022-09-29 (×4): qty 1
  Filled 2022-09-29: qty 3
  Filled 2022-09-29 (×2): qty 1
  Filled 2022-09-29 (×2): qty 3
  Filled 2022-09-29: qty 1
  Filled 2022-09-29: qty 3
  Filled 2022-09-29 (×12): qty 1
  Filled 2022-09-29: qty 3
  Filled 2022-09-29 (×8): qty 1
  Filled 2022-09-29: qty 3
  Filled 2022-09-29: qty 1
  Filled 2022-09-29: qty 3
  Filled 2022-09-29 (×17): qty 1
  Filled 2022-09-29: qty 3
  Filled 2022-09-29 (×9): qty 1
  Filled 2022-09-29: qty 3
  Filled 2022-09-29 (×3): qty 1
  Filled 2022-09-29: qty 3
  Filled 2022-09-29 (×3): qty 1
  Filled 2022-09-29 (×2): qty 3
  Filled 2022-09-29 (×3): qty 1
  Filled 2022-09-29: qty 3
  Filled 2022-09-29 (×4): qty 1

## 2022-09-29 NOTE — Progress Notes (Signed)
Jeremy Richarda OverlieM:[id number] DOB: 02/18/1951 DOA: 09/28/2022 PCP: [facility]    Brief Narrative:  72 year old with a history of DM2, HTN, HLD, chronic back pain on methadone, PTSD, depression/anxiety on chronic benzos who has been undergoing a workup for rapid functional decline and progressive encephalopathy since the end of January 2024.  Recent workup at the [facility] pointed to a diagnosis of NPH and the patient was referred to neurology and neurosurgery in the outpatient setting.  He was seen in the neurosurgery office on the date of this admission, and sent to the ER as it was felt that he would benefit from an LP.  In the ER CT of the head was negative for acute findings.  CT cervical spine was without acute findings.  He appeared to be significantly dehydrated on physical exam and was found to have a BUN of 57 with creatinine of 2.32.  Notes indicate that the ER physician discussed the possibility of NPH with the on-call neurologist who suggested that the workup for this is best performed in the outpatient setting.  Consultants:  None  Goals of Care:  Code Status: Full Code   DVT prophylaxis: Subcutaneous heparin  Interim Hx: Resting quietly in his room at the time of my visit.  No evidence of respiratory distress.  No family present at time of my exam.  No apparent discomfort.  Assessment & Plan:  Acute kidney injury Creatinine 2.3 on admission with recent baseline 1.18 August 2022 -appears to be simple prerenal azotemia -creatinine improving with hydration -renal ultrasound unable to view right kidney but otherwise unrevealing  Recent Labs  Lab 09/28/22 1555 09/28/22 1602 09/29/22 0328  CREATININE 2.32* 2.20* 1.54*    Progressive encephalopathy with significant functional decline Began at the end of January 2024 -was previously independent in function and ADLs and now no longer can live on his own -[place] workup has raised question of NPH -MRI 3/29  negative for acute abnormality -admitting MD and ER MD have discussed the case with Neurology who report that a proper NPH workup can only be accomplished in the outpatient setting -MRI venogram negative for dural venous sinus thrombosis - B12 borderline low at 367 - TSH not deranged -minimize sedating meds -hydrate -follow mental status  Modest B12 deficiency B12 367 -supplement to goal of >400  Mild transaminitis Likely simply due to hypoperfusion in setting of significant volume depletion -hold statin for now and monitor with hydration  Leukocytosis UA not consistent with UTI - CXR with no focal infiltrate/active disease -monitor with hydration  DM2 Utilizes insulin at home - A1c 9.7 -CBG controlled at present  HTN Hold medical therapy for now and follow  Chronic back pain On chronic methadone -continue for now to prevent withdrawal but minimize dose  Chronic anxiety/depression/PTSD Continue usual Ativan as it is chronically dosed but minimize dose  HLD Resume usual statin dose once LFTs have normalized   Family Communication: No family present at time of exam Disposition:     Objective: Blood pressure 135/87, pulse 98, temperature (!) 97.5 F (36.4 C), temperature source Oral, resp. rate 17, SpO2 99 %.  Intake/Output Summary (Last 24 hours) at 09/29/2022 0829 Last data filed at 09/29/2022 0414 Gross per 24 hour  Intake 796.13 ml  Output 300 ml  Net 496.13 ml   There were no vitals filed for this visit.  Examination: General: No acute respiratory distress Lungs: Clear to auscultation bilaterally without wheezes or crackles Cardiovascular: Regular rate  and rhythm without murmur gallop or rub normal S1 and S2 Abdomen: Nontender, nondistended, soft, bowel sounds positive, no rebound, no ascites, no appreciable mass Extremities: No significant cyanosis, clubbing, or edema bilateral lower extremities  CBC: Recent Labs  Lab 09/28/22 1555 09/28/22 1602 09/29/22 0328   WBC 21.0*  --  21.4*  NEUTROABS 17.6*  --   --   HGB 15.8 17.0 15.0  HCT 48.4 50.0 45.9  MCV 86.3  --  85.3  PLT 230  --  173   Basic Metabolic Panel: Recent Labs  Lab 09/28/22 1555 09/28/22 1602 09/29/22 0328  NA 141 141 141  K 3.8 3.7 3.8  CL 99 103 99  CO2 22  --  26  GLUCOSE 146* 151* 97  BUN 57* 57* 55*  CREATININE 2.32* 2.20* 1.54*  CALCIUM 10.8*  --  10.5*  MG  --   --  2.1   GFR: CrCl cannot be calculated (Unknown ideal weight.).   Scheduled Meds:  amLODipine  10 mg Oral Daily   heparin  5,000 Units Subcutaneous Q8H   insulin aspart  0-9 Units Subcutaneous TID WC   LORazepam  1 mg Oral QHS   methadone  10 mg Oral TID   rosuvastatin  40 mg Oral Daily   sodium chloride flush  3 mL Intravenous Q12H   Continuous Infusions:  thiamine (VITAMIN B1) injection Stopped (09/28/22 2250)   Followed by   Melene Muller ON 09/30/2022] thiamine (VITAMIN B1) injection       LOS: 0 days   Lonia Blood, MD [facility] Office  [phone] Pager - Text Page per Loretha Stapler  If 7PM-7AM, please contact night-coverage per Amion 09/29/2022, 8:29 AM

## 2022-09-29 NOTE — Evaluation (Signed)
Physical Therapy Evaluation Patient Details Name: Jeremy Medina. MRN: [id number] DOB: 03-31-1951 Today's Date: 09/29/2022  History of Present Illness  Jeremy Medina. is a 72 y.o. male who presents complaining of altered mental status and progressive decline.  MRI performed at [facility], concern for hydrocephalus and neurosurgeon recommended pt present to ED. Work-up pending. PMHx: type 2 diabetes, PTSD, chronic pain on methadone per son, hypertension, hyperlipidemia  Clinical Impression  Pt admitted with above diagnosis. He presents with impaired cognition, but per chart review he has been needed increased assist at baseline. Upon evaluation he was limited by cognition, pain, and decreased activity tolerance. Overall he requires total A +2 for bed mobility and max A +2 to stand at EOB. Pt did not initiate, sequence or functionally participate in mobility this date despite multimodal cues.  Pt currently with functional limitations due to the deficits listed below (see PT Problem List). Pt will benefit from acute skilled PT to increase their independence and safety with mobility to allow discharge.          Recommendations for follow up therapy are one component of a multi-disciplinary discharge planning process, led by the attending physician.  Recommendations may be updated based on patient status, additional functional criteria and insurance authorization.  Follow Up Recommendations Can patient physically be transported by private vehicle: No     Assistance Recommended at Discharge Frequent or constant Supervision/Assistance  Patient can return home with the following  Two people to help with walking and/or transfers;A lot of help with bathing/dressing/bathroom;Assistance with cooking/housework;Help with stairs or ramp for entrance;Assist for transportation    Equipment Recommendations Other (comment) (TBA)  Recommendations for Other Services       Functional Status Assessment Patient  has had a recent decline in their functional status and demonstrates the ability to make significant improvements in function in a reasonable and predictable amount of time.     Precautions / Restrictions Precautions Precautions: Fall Restrictions Weight Bearing Restrictions: No      Mobility  Bed Mobility Overal bed mobility: Needs Assistance Bed Mobility: Rolling, Supine to Sit, Sit to Supine Rolling: Total assist, +2 for physical assistance, +2 for safety/equipment   Supine to sit: Max assist, +2 for physical assistance, +2 for safety/equipment Sit to supine: Total assist   General bed mobility comments: pt resisting rolling for hygiene    Transfers Overall transfer level: Needs assistance Equipment used: 2 person hand held assist Transfers: Sit to/from Stand Sit to Stand: Max assist, +2 physical assistance, +2 safety/equipment           General transfer comment: unable to progress to marching or stepping    Ambulation/Gait                  Stairs            Wheelchair Mobility    Modified Rankin (Stroke Patients Only)       Balance Overall balance assessment: Needs assistance Sitting-balance support: Feet supported Sitting balance-Leahy Scale: Fair Sitting balance - Comments: close min G   Standing balance support: Bilateral upper extremity supported, During functional activity Standing balance-Leahy Scale: Poor Standing balance comment: Could not unweight feet to take a side step to [facility]. Didnt stand fully upright                             Pertinent Vitals/Pain Pain Assessment Pain Assessment: Faces Faces Pain Scale: Hurts little more  Breathing: normal Negative Vocalization: none Facial Expression: smiling or inexpressive Body Language: relaxed Consolability: no need to console PAINAD Score: 0 Pain Location: "neck" and periarea with hygiene Pain Descriptors / Indicators: Discomfort, Grimacing Pain Intervention(s):  Limited activity within patient's tolerance, Monitored during session, Repositioned    Home Living Family/patient expects to be discharged to:: Private residence                   Additional Comments: Pt unable to provide home set up    Prior Function Prior Level of Function : Patient poor historian/Family not available             Mobility Comments: Per chart, pt was ambulatory prior to January ADLs Comments: Per chart, general decline in ADL function     Hand Dominance   Dominant Hand: Left    Extremity/Trunk Assessment   Upper Extremity Assessment Upper Extremity Assessment: Defer to OT evaluation    Lower Extremity Assessment Lower Extremity Assessment: RLE deficits/detail;LLE deficits/detail RLE Deficits / Details: grossly 3-/5 LLE Deficits / Details: grossly 3-/5    Cervical / Trunk Assessment Cervical / Trunk Assessment: Normal  Communication   Communication: Expressive difficulties  Cognition Arousal/Alertness: Awake/alert Behavior During Therapy: Flat affect Overall Cognitive Status: No family/caregiver present to determine baseline cognitive functioning                                 General Comments: Pt presented to ED with AMS, no family present to detiermine baseline. Pt able to state "[place]," and seemingly accurate with yes/no about 50% of the time. Did not initiate tasks or sequence despite cues        General Comments General comments (skin integrity, edema, etc.): VSS on RA, hemorrhoid on buttocks.    Exercises     Assessment/Plan    PT Assessment Patient needs continued PT services  PT Problem List Decreased activity tolerance;Decreased balance;Decreased mobility;Decreased knowledge of use of DME;Decreased safety awareness;Decreased knowledge of precautions       PT Treatment Interventions DME instruction;Gait training;Functional mobility training;Therapeutic activities;Therapeutic exercise;Balance  training;Patient/family education    PT Goals (Current goals can be found in the Care Plan section)  Acute Rehab PT Goals Patient Stated Goal: unable to state PT Goal Formulation: Patient unable to participate in goal setting Time For Goal Achievement: 10/13/22 Potential to Achieve Goals: Fair    Frequency Min 2X/week     Co-evaluation PT/OT/SLP Co-Evaluation/Treatment: Yes Reason for Co-Treatment: Complexity of the patient's impairments (multi-system involvement);For patient/therapist safety PT goals addressed during session: Mobility/safety with mobility OT goals addressed during session: ADL's and self-care       AM-PAC PT "6 Clicks" Mobility  Outcome Measure Help needed turning from your back to your side while in a flat bed without using bedrails?: Total Help needed moving from lying on your back to sitting on the side of a flat bed without using bedrails?: Total Help needed moving to and from a bed to a chair (including a wheelchair)?: Total Help needed standing up from a chair using your arms (e.g., wheelchair or bedside chair)?: Total Help needed to walk in hospital room?: Total Help needed climbing 3-5 steps with a railing? : Total 6 Click Score: 6    End of Session Equipment Utilized During Treatment: Gait belt Activity Tolerance: Patient limited by fatigue Patient left: in bed;with call bell/phone within reach;with bed alarm set Nurse Communication: Mobility status;Need for lift equipment  PT Visit Diagnosis: Unsteadiness on feet (R26.81);Muscle weakness (generalized) (M62.81)    Time: 3875-6433 PT Time Calculation (min) (ACUTE ONLY): 19 min   Charges:   PT Evaluation $PT Eval Moderate Complexity: 1 Mod          Kilah Drahos M,PT Acute Rehab Services [phone]   Bevelyn Buckles 09/29/2022, 3:51 PM

## 2022-09-29 NOTE — Evaluation (Signed)
Occupational Therapy Evaluation Patient Details Name: Jeremy Medina. MRN: [id number] DOB: 06-Mar-1951 Today's Date: 09/29/2022   History of Present Illness Jeremy Medina. is a 72 y.o. male who presents complaining of altered mental status and progressive decline.  MRI performed at [facility], concern for hydrocephalus and neurosurgeon recommended pt present to ED. Work-up pending. PMHx: type 2 diabetes, PTSD, chronic pain on methadone per son, hypertension, hyperlipidemia   Clinical Impression   Jeremy Medina was evaluated s/p the above admission list. He presents with impaired cognition, but per chart review he has been needed increased assist at baseline. Upon evaluation he was limited by cognition, pain, and decreased activity tolerance. Overall he requires total A +2 for bed mobility and max A +2 to stand at EOB but unable to take steps. Due to the deficits listed below he also required total A +2 for ADLs, pt did not initiate, sequence or functionally participate in ADLs this date despite multimodal cues. Pt will benefit from continued acute OT services and skilled inpatient follow up therapy, <3 hours/day.       Recommendations for follow up therapy are one component of a multi-disciplinary discharge planning process, led by the attending physician.  Recommendations may be updated based on patient status, additional functional criteria and insurance authorization.   Assistance Recommended at Discharge Frequent or constant Supervision/Assistance  Patient can return home with the following A lot of help with walking and/or transfers;Two people to help with walking and/or transfers;A lot of help with bathing/dressing/bathroom;Two people to help with bathing/dressing/bathroom;Direct supervision/assist for medications management;Direct supervision/assist for financial management;Assist for transportation;Help with stairs or ramp for entrance;Assistance with feeding;Assistance with  cooking/housework    Functional Status Assessment  Patient has had a recent decline in their functional status and demonstrates the ability to make significant improvements in function in a reasonable and predictable amount of time.  Equipment Recommendations  None recommended by OT       Precautions / Restrictions Precautions Precautions: Fall Restrictions Weight Bearing Restrictions: No      Mobility Bed Mobility Overal bed mobility: Needs Assistance Bed Mobility: Rolling, Supine to Sit, Sit to Supine Rolling: Total assist, +2 for physical assistance, +2 for safety/equipment   Supine to sit: Max assist, +2 for physical assistance, +2 for safety/equipment Sit to supine: Total assist   General bed mobility comments: pt resisting rolling for hygiene    Transfers Overall transfer level: Needs assistance Equipment used: 2 person hand held assist Transfers: Sit to/from Stand Sit to Stand: Max assist, +2 physical assistance, +2 safety/equipment           General transfer comment: unable to progress to marching or stepping      Balance Overall balance assessment: Needs assistance Sitting-balance support: Feet supported Sitting balance-Leahy Scale: Fair Sitting balance - Comments: close min G   Standing balance support: Bilateral upper extremity supported, During functional activity Standing balance-Leahy Scale: Poor                             ADL either performed or assessed with clinical judgement   ADL Overall ADL's : Needs assistance/impaired                                       General ADL Comments: total A for all aspects of care due to impaired cognition  Vision Baseline Vision/History: 0 No visual deficits Vision Assessment?: No apparent visual deficits Additional Comments: seemingly WFL - unable to formally assess     Perception Perception Perception Tested?: No   Praxis Praxis Praxis tested?: Not tested     Pertinent Vitals/Pain Pain Assessment Pain Assessment: Faces Faces Pain Scale: Hurts little more Pain Location: "neck" and periarea with hygiene Pain Descriptors / Indicators: Discomfort, Grimacing Pain Intervention(s): Limited activity within patient's tolerance, Monitored during session     Hand Dominance     Extremity/Trunk Assessment Upper Extremity Assessment Upper Extremity Assessment: Difficult to assess due to impaired cognition   Lower Extremity Assessment Lower Extremity Assessment: Defer to PT evaluation   Cervical / Trunk Assessment Cervical / Trunk Assessment: Normal   Communication Communication Communication: Expressive difficulties   Cognition Arousal/Alertness: Awake/alert Behavior During Therapy: Flat affect Overall Cognitive Status: No family/caregiver present to determine baseline cognitive functioning                                 General Comments: Pt presented to ED with AMS, no family present to detiermine baseline. Pt able to state "[place]," and seemingly accurate with yes/no about 50% of the time. Did not initiate tasks or sequence despite cues     General Comments  VSS on RA, pt noted to have hemorrhoid    Exercises     Shoulder Instructions      Home Living Family/patient expects to be discharged to:: Private residence                                 Additional Comments: Pt unable to provide home set up      Prior Functioning/Environment Prior Level of Function : Patient poor historian/Family not available             Mobility Comments: Per chart, pt was ambulatory prior to January ADLs Comments: Per chart, general decline in ADL function        OT Problem List: Decreased strength;Decreased range of motion;Decreased activity tolerance;Impaired balance (sitting and/or standing);Decreased cognition;Decreased safety awareness;Decreased knowledge of use of DME or AE;Pain      OT  Treatment/Interventions: Self-care/ADL training;Therapeutic exercise;DME and/or AE instruction;Energy conservation;Therapeutic activities;Cognitive remediation/compensation;Patient/family education;Balance training    OT Goals(Current goals can be found in the care plan section) Acute Rehab OT Goals Patient Stated Goal: unable to state OT Goal Formulation: With patient Time For Goal Achievement: 09/29/22 Potential to Achieve Goals: Good ADL Goals Pt Will Perform Grooming: with mod assist;sitting Pt Will Perform Upper Body Dressing: with mod assist;sitting Pt Will Transfer to Toilet: with mod assist;bedside commode;stand pivot transfer Additional ADL Goal #1: Pt will follow simple 1 step commands 50% of the time to complete ADL task  OT Frequency: Min 1X/week    Co-evaluation PT/OT/SLP Co-Evaluation/Treatment: Yes Reason for Co-Treatment: Complexity of the patient's impairments (multi-system involvement);For patient/therapist safety;To address functional/ADL transfers   OT goals addressed during session: ADL's and self-care      AM-PAC OT "6 Clicks" Daily Activity     Outcome Measure Help from another person eating meals?: Total Help from another person taking care of personal grooming?: Total Help from another person toileting, which includes using toliet, bedpan, or urinal?: Total Help from another person bathing (including washing, rinsing, drying)?: Total Help from another person to put on and taking off regular upper body clothing?: Total Help  from another person to put on and taking off regular lower body clothing?: Total 6 Click Score: 6   End of Session Equipment Utilized During Treatment: Gait belt Nurse Communication: Mobility status (hemorrhoid)  Activity Tolerance: Patient tolerated treatment well Patient left: in bed;with call bell/phone within reach;with bed alarm set  OT Visit Diagnosis: Unsteadiness on feet (R26.81);Other abnormalities of gait and mobility  (R26.89);Muscle weakness (generalized) (M62.81);Pain                Time: 3887-1959 OT Time Calculation (min): 18 min Charges:  OT General Charges $OT Visit: 1 Visit OT Evaluation $OT Eval Moderate Complexity: 1 Mod  Derenda Mis, OTR/L Acute Rehabilitation Services Office [phone] Secure Chat Communication Preferred   Donia Pounds 09/29/2022, 1:15 PM

## 2022-09-29 NOTE — Plan of Care (Signed)
  Problem: Coping: Goal: Ability to adjust to condition or change in health will improve Outcome: Progressing   

## 2022-09-30 DIAGNOSIS — N179 Acute kidney failure, unspecified: Secondary | ICD-10-CM | POA: Diagnosis not present

## 2022-09-30 LAB — COMPREHENSIVE METABOLIC PANEL
ALT: 44 U/L (ref 0–44)
AST: 69 U/L — ABNORMAL HIGH (ref 15–41)
Albumin: 3.3 g/dL — ABNORMAL LOW (ref 3.5–5.0)
Alkaline Phosphatase: 63 U/L (ref 38–126)
Anion gap: 9 (ref 5–15)
BUN: 42 mg/dL — ABNORMAL HIGH (ref 8–23)
CO2: 27 mmol/L (ref 22–32)
Calcium: 9.3 mg/dL (ref 8.9–10.3)
Chloride: 102 mmol/L (ref 98–111)
Creatinine, Ser: 1.27 mg/dL — ABNORMAL HIGH (ref 0.61–1.24)
GFR, Estimated: >60 mL/min (ref >60)
Glucose, Bld: 205 mg/dL — ABNORMAL HIGH (ref 70–99)
Potassium: 3.7 mmol/L (ref 3.5–5.1)
Sodium: 138 mmol/L (ref 135–145)
Total Bilirubin: 1 mg/dL (ref 0.3–1.2)
Total Protein: 6.7 g/dL (ref 6.5–8.1)

## 2022-09-30 LAB — CULTURE, BLOOD (ROUTINE X 2)

## 2022-09-30 LAB — GLUCOSE, CAPILLARY
Glucose-Capillary: 185 mg/dL — ABNORMAL HIGH (ref 70–99)
Glucose-Capillary: 291 mg/dL — ABNORMAL HIGH (ref 70–99)
Glucose-Capillary: 239 mg/dL — ABNORMAL HIGH (ref 70–99)
Glucose-Capillary: 207 mg/dL — ABNORMAL HIGH (ref 70–99)

## 2022-09-30 LAB — CBC
HCT: 41 % (ref 39.0–52.0)
Hemoglobin: 13.8 g/dL (ref 13.0–17.0)
MCH: 28.5 pg (ref 26.0–34.0)
MCHC: 33.7 g/dL (ref 30.0–36.0)
MCV: 84.7 fL (ref 80.0–100.0)
Platelets: 159 10*3/uL (ref 150–400)
RBC: 4.84 MIL/uL (ref 4.22–5.81)
RDW: 14.6 % (ref 11.5–15.5)
WBC: 17.7 10*3/uL — ABNORMAL HIGH (ref 4.0–10.5)
nRBC: 0 % (ref 0.0–0.2)

## 2022-09-30 LAB — RPR: RPR Ser Ql: NONREACTIVE

## 2022-09-30 LAB — AMMONIA: Ammonia: 20 umol/L (ref 9–35)

## 2022-09-30 MED ORDER — METHADONE HCL 10 MG PO TABS
5.0000 mg | ORAL_TABLET | Freq: Once | ORAL | Status: AC
  Administered 2022-09-30: 5 mg via ORAL
  Filled 2022-09-30: qty 1

## 2022-09-30 MED ORDER — METHADONE HCL 10 MG PO TABS
10.0000 mg | ORAL_TABLET | Freq: Three times a day (TID) | ORAL | Status: DC
  Administered 2022-09-30 – 2022-10-10 (×30): 10 mg via ORAL
  Filled 2022-09-30 (×31): qty 1

## 2022-09-30 NOTE — Progress Notes (Signed)
Jeremy Medina.  NWG:[id number] DOB: June 28, 1950 DOA: 09/28/2022 PCP: [facility]    Brief Narrative:  72 year old with a history of DM2, HTN, HLD, chronic back pain on methadone, PTSD, depression/anxiety on chronic benzos who has been undergoing a workup for rapid functional decline and progressive encephalopathy since the end of January 2024.  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  He was seen in the neurosurgery office on the date of this admission, and sent to the ER as it was felt that he would benefit from an LP.  In the ER CT of the head was negative for acute findings.  CT cervical spine was without acute findings.  He appeared to be significantly dehydrated on physical exam and was found to have a BUN of 57 with creatinine of 2.32.  Notes indicate that the ER physician discussed the possibility of NPH with the on-call neurologist who suggested that the workup for this is best performed in the outpatient setting.  Consultants:  None  Goals of Care:  Code Status: Full Code   DVT prophylaxis: Subcutaneous heparin  Interim Hx: No acute events reported since my visit yesterday.  Afebrile.  Vital signs stable.  CBG reasonably well-controlled.  Renal function improving.  LFTs improving.  Sitting up in a bedside chair.  Makes eye contact with examiner but will not answer questions.  Appears to be in pain.  Will nod his head and confirmation when asked if he is hurting.  Assessment & Plan:  Acute kidney injury Creatinine 2.3 on admission with recent baseline 1.18 August 2022 -appears to be simple prerenal azotemia -creatinine improving with hydration -renal ultrasound unable to view right kidney but otherwise unrevealing  Recent Labs  Lab 09/28/22 1555 09/28/22 1602 09/29/22 0328 09/30/22 0500  CREATININE 2.32* 2.20* 1.54* 1.27*    Progressive encephalopathy  with significant functional decline Began at the end of January 2024 - was previously independent in function and ADLs and now no longer can live on his own - [place] workup has raised question of NPH -MRI 3/29 negative for acute abnormality - admitting MD and ER MD have discussed the case with Neurology who report that a proper NPH workup can only be accomplished in the outpatient setting - MRI venogram negative for dural venous sinus thrombosis - B12 borderline low at 367 - TSH not deranged -RPR negative - ammonia normal after volume expansion - minimize sedating meds -continue to hydrate  Modest B12 deficiency B12 367 - supplement to goal of >400  Mild transaminitis Likely simply due to hypoperfusion in setting of significant volume depletion -improving with simple hydration -continue to hold statin for now  Leukocytosis UA not consistent with UTI - CXR with no focal infiltrate/active disease -WBC remains elevated but is improving with simple volume expansion -afebrile -follow  DM2 Utilizes insulin at home - A1c 9.7 -CBG controlled   HTN BP stable without medical therapy at present  Chronic back pain On chronic methadone -return to usual home dose as patient appears to be in pain today  Chronic anxiety/depression/PTSD Continue usual Ativan as it is chronically dosed but dose decreased to see if this will improve mental status  HLD Resume usual statin dose once LFTs have normalized   Family Communication: No family present at time of exam Disposition: Unclear at present   Objective: Blood pressure 126/83, pulse (!) 52, temperature 98 F (  36.7 C), resp. rate 18, SpO2 98 %.  Intake/Output Summary (Last 24 hours) at 09/30/2022 0848 Last data filed at 09/29/2022 2112 Gross per 24 hour  Intake 50 ml  Output 700 ml  Net -650 ml    There were no vitals filed for this visit.  Examination: General: No acute respiratory distress Lungs: Clear to auscultation bilaterally without  wheezes or crackles Cardiovascular: Regular rate and rhythm without murmur  Abdomen: Nontender, nondistended, soft, bowel sounds positive, no rebound, no ascites Extremities: No significant cyanosis, clubbing, or edema bilateral lower extremities  CBC: Recent Labs  Lab 09/28/22 1555 09/28/22 1602 09/29/22 0328 09/30/22 0500  WBC 21.0*  --  21.4* 17.7*  NEUTROABS 17.6*  --   --   --   HGB 15.8 17.0 15.0 13.8  HCT 48.4 50.0 45.9 41.0  MCV 86.3  --  85.3 84.7  PLT 230  --  173 159    Basic Metabolic Panel: Recent Labs  Lab 09/28/22 1555 09/28/22 1602 09/29/22 0328 09/30/22 0500  NA 141 141 141 138  K 3.8 3.7 3.8 3.7  CL 99 103 99 102  CO2 22  --  26 27  GLUCOSE 146* 151* 97 205*  BUN 57* 57* 55* 42*  CREATININE 2.32* 2.20* 1.54* 1.27*  CALCIUM 10.8*  --  10.5* 9.3  MG  --   --  2.1  --     GFR: CrCl cannot be calculated (Unknown ideal weight.).   Scheduled Meds:  cyanocobalamin  1,000 mcg Subcutaneous Daily   heparin  5,000 Units Subcutaneous Q8H   insulin aspart  0-9 Units Subcutaneous TID WC   lipase/protease/amylase  36,000 Units Oral TID AC   LORazepam  0.5 mg Oral QHS   methadone  5 mg Oral TID   sodium chloride flush  3 mL Intravenous Q12H   Continuous Infusions:  thiamine (VITAMIN B1) injection 500 mg (09/29/22 2112)   Followed by   thiamine (VITAMIN B1) injection       LOS: 0 days   Lonia Blood, MD [facility] Office  [phone] Pager - Text Page per Loretha Stapler  If 7PM-7AM, please contact night-coverage per Amion 09/30/2022, 8:48 AM
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
## 2022-09-30 NOTE — Plan of Care (Signed)
  Problem: Coping: Goal: Ability to adjust to condition or change in health will improve Outcome: Progressing   

## 2022-09-30 NOTE — TOC Progression Note (Signed)
Transition of Care (TOC) - Progression Note    Patient Details  Name: Jeremy Medina. MRN: [id number] Date of Birth: 1950/06/25  Transition of Care [facility]) CM/SW Contact  Helene Kelp, [name] Phone Number: 09/30/2022, 3:40 PM  Clinical Narrative:    CSW spoke with the patient's son via phone and then in person at the bedside today.  The son Jeremy Medina / [id number]) expressed SNF bed offer preference as [facility]. CSW processed with the son regarding caregiver stress and wanting what's best for the patient.   NOTE: Pt son would like for the medical provider to follow-up with him regarding the patient may needing a spinal tap based on conversations held with a provider (name unknown) per the son.   CSW  attempted to follow-up with the SNF [facility]) regarding the bed offer, but was unable to reach a representative.   Follow-up needs Contact with the SNF [facility]) to follow-up with bed offer, and complete discharge transfer process.   Expected Discharge Plan: Skilled Nursing Facility Barriers to Discharge: Continued Medical Work up  Expected Discharge Plan and Services In-house Referral: Clinical Social Work   Living arrangements for the past 2 months: Single Family Home                 Social Determinants of Health (SDOH) Interventions SDOH Screenings   Food Insecurity: Patient Unable To Answer (09/29/2022)  Transportation Needs: Patient Unable To Answer (09/29/2022)  Utilities: Patient Unable To Answer (09/29/2022)  Depression (PHQ2-9): Low Risk  (04/27/2019)  Tobacco Use: High Risk (09/29/2022)    Readmission Risk Interventions     No data to display
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
## 2022-10-01 DIAGNOSIS — R4182 Altered mental status, unspecified: Secondary | ICD-10-CM | POA: Diagnosis not present

## 2022-10-01 DIAGNOSIS — G912 (Idiopathic) normal pressure hydrocephalus: Secondary | ICD-10-CM | POA: Diagnosis not present

## 2022-10-01 DIAGNOSIS — N179 Acute kidney failure, unspecified: Secondary | ICD-10-CM | POA: Diagnosis not present

## 2022-10-01 LAB — CBC
HCT: 37.9 % — ABNORMAL LOW (ref 39.0–52.0)
Hemoglobin: 12.9 g/dL — ABNORMAL LOW (ref 13.0–17.0)
MCH: 29 pg (ref 26.0–34.0)
MCHC: 34 g/dL (ref 30.0–36.0)
MCV: 85.2 fL (ref 80.0–100.0)
Platelets: 144 10*3/uL — ABNORMAL LOW (ref 150–400)
RBC: 4.45 MIL/uL (ref 4.22–5.81)
RDW: 14.7 % (ref 11.5–15.5)
WBC: 14 10*3/uL — ABNORMAL HIGH (ref 4.0–10.5)
nRBC: 0 % (ref 0.0–0.2)

## 2022-10-01 LAB — BASIC METABOLIC PANEL
Anion gap: 10 (ref 5–15)
BUN: 33 mg/dL — ABNORMAL HIGH (ref 8–23)
CO2: 26 mmol/L (ref 22–32)
Calcium: 9.3 mg/dL (ref 8.9–10.3)
Chloride: 104 mmol/L (ref 98–111)
Creatinine, Ser: 1.07 mg/dL (ref 0.61–1.24)
GFR, Estimated: >60 mL/min (ref >60)
Glucose, Bld: 191 mg/dL — ABNORMAL HIGH (ref 70–99)
Potassium: 3.4 mmol/L — ABNORMAL LOW (ref 3.5–5.1)
Sodium: 140 mmol/L (ref 135–145)

## 2022-10-01 LAB — GLUCOSE, CAPILLARY
Glucose-Capillary: 184 mg/dL — ABNORMAL HIGH (ref 70–99)
Glucose-Capillary: 197 mg/dL — ABNORMAL HIGH (ref 70–99)
Glucose-Capillary: 199 mg/dL — ABNORMAL HIGH (ref 70–99)
Glucose-Capillary: 159 mg/dL — ABNORMAL HIGH (ref 70–99)

## 2022-10-01 LAB — FOLATE: Folate: 5.9 ng/mL — ABNORMAL LOW (ref >5.9)

## 2022-10-01 LAB — CULTURE, BLOOD (ROUTINE X 2): Culture: NO GROWTH

## 2022-10-01 MED ORDER — INSULIN GLARGINE-YFGN 100 UNIT/ML ~~LOC~~ SOLN
10.0000 [IU] | Freq: Every day | SUBCUTANEOUS | Status: DC
  Administered 2022-10-01 – 2022-10-04 (×4): 10 [IU] via SUBCUTANEOUS
  Filled 2022-10-01 (×5): qty 0.1

## 2022-10-01 MED ORDER — SODIUM CHLORIDE 0.9 % IV SOLN
1.0000 mg | Freq: Once | INTRAVENOUS | Status: AC
  Administered 2022-10-01: 1 mg via INTRAVENOUS
  Filled 2022-10-01: qty 0.2

## 2022-10-01 MED ORDER — ENOXAPARIN SODIUM 40 MG/0.4ML IJ SOSY
40.0000 mg | PREFILLED_SYRINGE | INTRAMUSCULAR | Status: DC
  Administered 2022-10-01: 40 mg via SUBCUTANEOUS
  Filled 2022-10-01: qty 0.4

## 2022-10-01 MED ORDER — POTASSIUM CHLORIDE CRYS ER 20 MEQ PO TBCR
20.0000 meq | EXTENDED_RELEASE_TABLET | Freq: Once | ORAL | Status: AC
  Administered 2022-10-01: 20 meq via ORAL
  Filled 2022-10-01: qty 1

## 2022-10-01 MED ORDER — FOLIC ACID 1 MG PO TABS
1.0000 mg | ORAL_TABLET | Freq: Every day | ORAL | Status: DC
  Administered 2022-10-02 – 2022-11-02 (×29): 1 mg via ORAL
  Filled 2022-10-01 (×30): qty 1

## 2022-10-01 NOTE — Progress Notes (Signed)
EEG complete - results pending 

## 2022-10-01 NOTE — Progress Notes (Addendum)
Jeremy Medina.  XBW:[id number] DOB: 07/09/1950 DOA: 09/28/2022 PCP: [facility]    Brief Narrative:  72 year old with a history of DM2, HTN, HLD, chronic back pain on methadone, PTSD, depression/anxiety on chronic benzos who has been undergoing a workup for rapid functional decline and progressive encephalopathy since the end of January 2024.  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  He was seen in the Neurosurgery office on the date of this admission, and sent to the ER as it was felt that he would benefit from an LP.  In the ER CT of the head was negative for acute findings.  CT cervical spine was without acute findings.  He appeared to be significantly dehydrated on physical exam and was found to have a BUN of 57 with creatinine of 2.32.  Notes indicate that the ER physician discussed the possibility of NPH with the on-call neurologist who suggested that the workup for this is best performed in the outpatient setting.  Consultants:  Neurology  Goals of Care:  Code Status: Full Code   DVT prophylaxis: Subcutaneous heparin  Interim Hx: No acute events recorded overnight.  Therapy has suggested the patient will require SNF rehab placement at time of discharge and I agree.  He is afebrile.  Vital signs are otherwise stable.  He is unchanged at the time my exam.  His eyes are open and he will look at me but he will not answer questions or follow simple commands.  He does not appear to be in distress.  Assessment & Plan:  Acute kidney injury Creatinine 2.3 on admission with recent baseline 1.18 August 2022 -appears to be simple prerenal azotemia -creatinine improving with hydration -renal ultrasound unable to view right kidney but otherwise unrevealing  Recent Labs  Lab 09/28/22 1555 09/28/22 1602 09/29/22 0328 09/30/22 0500 10/01/22 0356  CREATININE 2.32* 2.20*  1.54* 1.27* 1.07    Progressive encephalopathy with significant functional decline Began at the end of January 2024 - was previously independent in function and ADLs and now no longer can live on his own - [place] workup has raised question of NPH - MRI 3/29 in our system negative for acute abnormality - MRI venogram negative for dural venous sinus thrombosis - B12 borderline low at 367 - TSH not deranged -RPR negative - ammonia normal after volume expansion - minimize sedating meds - continue to hydrate - asked Neuro to see today as I wonder it it would not be worthwhile to attempt high volume LP during this admit to monitor for impact  Modest B12 deficiency B12 367 - supplement to goal of >400  Modest folic acid deficiency Folic acid 5.9 - supplement  Mild transaminitis Likely simply due to hypoperfusion in setting of significant volume depletion - improving with simple hydration - continue to hold statin for now  Leukocytosis UA not consistent with UTI - CXR with no focal infiltrate/active disease -WBC remains elevated but is steadily improving with simple volume expansion -afebrile -continue to follow trend  DM2 Utilizes insulin at home - A1c 9.7 -CBG trending up -adjust insulin dosing today  HTN BP stable without medical therapy at present  Chronic back pain On chronic methadone -returned to usual home dose 4/14 as patient appeared to be in pain   Chronic anxiety/depression/PTSD Continue usual Ativan as it is chronically dosed but dose decreased to see if this will improve  mental status  HLD Resume usual statin dose once LFTs have normalized   Family Communication: Call placed to number listed for son in chart with no answer earlier in the day - called again later in day and connected w/ son to explain our treatment plan  Disposition: SNF rehab stay will be required though patient was living independently prior to this admission   Objective: Blood pressure 131/81, pulse 91,  temperature 98.3 F (36.8 C), temperature source Axillary, resp. rate 18, SpO2 99 %.  Intake/Output Summary (Last 24 hours) at 10/01/2022 0811 Last data filed at 10/01/2022 0547 Gross per 24 hour  Intake 150 ml  Output 600 ml  Net -450 ml    There were no vitals filed for this visit.  Examination: General: No acute respiratory distress Lungs: Clear to auscultation bilaterally without wheezes or crackles Cardiovascular: Regular rate and rhythm -no murmur Abdomen: Nontender, nondistended, soft, bowel sounds positive, no rebound, no ascites Extremities: No signif edema bilateral lower extremities  CBC: Recent Labs  Lab 09/28/22 1555 09/28/22 1602 09/29/22 0328 09/30/22 0500 10/01/22 0356  WBC 21.0*  --  21.4* 17.7* 14.0*  NEUTROABS 17.6*  --   --   --   --   HGB 15.8   < > 15.0 13.8 12.9*  HCT 48.4   < > 45.9 41.0 37.9*  MCV 86.3  --  85.3 84.7 85.2  PLT 230  --  173 159 144*   < > = values in this interval not displayed.    Basic Metabolic Panel: Recent Labs  Lab 09/29/22 0328 09/30/22 0500 10/01/22 0356  NA 141 138 140  K 3.8 3.7 3.4*  CL 99 102 104  CO2 GLUCOSE 97 205* 191*  BUN 55* 42* 33*  CREATININE 1.54* 1.27* 1.07  CALCIUM 10.5* 9.3 9.3  MG 2.1  --   --     GFR: CrCl cannot be calculated (Unknown ideal weight.).   Scheduled Meds:  heparin  5,000 Units Subcutaneous Q8H   insulin aspart  0-9 Units Subcutaneous TID WC   lipase/protease/amylase  36,000 Units Oral TID AC   LORazepam  0.5 mg Oral QHS   methadone  10 mg Oral TID   sodium chloride flush  3 mL Intravenous Q12H   Continuous Infusions:  thiamine (VITAMIN B1) injection 250 mg (09/30/22 2211)     LOS: 0 days   Lonia Blood, MD [facility] Office  [phone] Pager - Text Page per Loretha Stapler  If 7PM-7AM, please contact night-coverage per Amion 10/01/2022, 8:11 AM
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
## 2022-10-01 NOTE — TOC Progression Note (Signed)
Transition of Care (TOC) - Progression Note    Patient Details  Name: Jeremy Medina. MRN: [id number] Date of Birth: 26-Jan-1951  Transition of Care [facility]) CM/SW Contact  Delilah Shan, LCSWA Phone Number: 10/01/2022, 1:56 PM  Clinical Narrative:     CSW spoke with Whitney with [facility] who confirmed SNF bed offer. CSW will continue to follow and assist with patients dc planning needs.  Expected Discharge Plan: Skilled Nursing Facility Barriers to Discharge: Continued Medical Work up  Expected Discharge Plan and Services In-house Referral: Clinical Social Work     Living arrangements for the past 2 months: Single Family Home                                       Social Determinants of Health (SDOH) Interventions SDOH Screenings   Food Insecurity: Patient Unable To Answer (09/29/2022)  Transportation Needs: Patient Unable To Answer (09/29/2022)  Utilities: Patient Unable To Answer (09/29/2022)  Depression (PHQ2-9): Low Risk  (04/27/2019)  Tobacco Use: High Risk (09/29/2022)    Readmission Risk Interventions     No data to display

## 2022-10-02 ENCOUNTER — Observation Stay (HOSPITAL_COMMUNITY): Payer: No Typology Code available for payment source

## 2022-10-02 DIAGNOSIS — E785 Hyperlipidemia, unspecified: Secondary | ICD-10-CM | POA: Diagnosis not present

## 2022-10-02 DIAGNOSIS — R4182 Altered mental status, unspecified: Secondary | ICD-10-CM | POA: Diagnosis not present

## 2022-10-02 DIAGNOSIS — Z515 Encounter for palliative care: Secondary | ICD-10-CM | POA: Diagnosis not present

## 2022-10-02 DIAGNOSIS — Z66 Do not resuscitate: Secondary | ICD-10-CM | POA: Diagnosis not present

## 2022-10-02 DIAGNOSIS — F0393 Unspecified dementia, unspecified severity, with mood disturbance: Secondary | ICD-10-CM | POA: Diagnosis present

## 2022-10-02 DIAGNOSIS — R7989 Other specified abnormal findings of blood chemistry: Secondary | ICD-10-CM | POA: Diagnosis not present

## 2022-10-02 DIAGNOSIS — Z6832 Body mass index (BMI) 32.0-32.9, adult: Secondary | ICD-10-CM | POA: Diagnosis not present

## 2022-10-02 DIAGNOSIS — R569 Unspecified convulsions: Secondary | ICD-10-CM

## 2022-10-02 DIAGNOSIS — G030 Nonpyogenic meningitis: Secondary | ICD-10-CM | POA: Diagnosis not present

## 2022-10-02 DIAGNOSIS — Z1152 Encounter for screening for COVID-19: Secondary | ICD-10-CM | POA: Diagnosis not present

## 2022-10-02 DIAGNOSIS — G910 Communicating hydrocephalus: Secondary | ICD-10-CM | POA: Diagnosis present

## 2022-10-02 DIAGNOSIS — E1140 Type 2 diabetes mellitus with diabetic neuropathy, unspecified: Secondary | ICD-10-CM | POA: Diagnosis present

## 2022-10-02 DIAGNOSIS — C712 Malignant neoplasm of temporal lobe: Secondary | ICD-10-CM | POA: Diagnosis present

## 2022-10-02 DIAGNOSIS — G912 (Idiopathic) normal pressure hydrocephalus: Secondary | ICD-10-CM | POA: Diagnosis not present

## 2022-10-02 DIAGNOSIS — E119 Type 2 diabetes mellitus without complications: Secondary | ICD-10-CM | POA: Diagnosis not present

## 2022-10-02 DIAGNOSIS — E1165 Type 2 diabetes mellitus with hyperglycemia: Secondary | ICD-10-CM | POA: Diagnosis present

## 2022-10-02 DIAGNOSIS — K633 Ulcer of intestine: Secondary | ICD-10-CM | POA: Diagnosis present

## 2022-10-02 DIAGNOSIS — K8689 Other specified diseases of pancreas: Secondary | ICD-10-CM | POA: Diagnosis present

## 2022-10-02 DIAGNOSIS — I10 Essential (primary) hypertension: Secondary | ICD-10-CM | POA: Diagnosis present

## 2022-10-02 DIAGNOSIS — G9340 Encephalopathy, unspecified: Secondary | ICD-10-CM | POA: Diagnosis present

## 2022-10-02 DIAGNOSIS — G8929 Other chronic pain: Secondary | ICD-10-CM | POA: Diagnosis not present

## 2022-10-02 DIAGNOSIS — D62 Acute posthemorrhagic anemia: Secondary | ICD-10-CM | POA: Diagnosis not present

## 2022-10-02 DIAGNOSIS — G40909 Epilepsy, unspecified, not intractable, without status epilepticus: Secondary | ICD-10-CM | POA: Diagnosis not present

## 2022-10-02 DIAGNOSIS — F32A Depression, unspecified: Secondary | ICD-10-CM | POA: Diagnosis present

## 2022-10-02 DIAGNOSIS — E669 Obesity, unspecified: Secondary | ICD-10-CM | POA: Diagnosis not present

## 2022-10-02 DIAGNOSIS — F0394 Unspecified dementia, unspecified severity, with anxiety: Secondary | ICD-10-CM | POA: Diagnosis present

## 2022-10-02 DIAGNOSIS — Z7189 Other specified counseling: Secondary | ICD-10-CM | POA: Diagnosis not present

## 2022-10-02 DIAGNOSIS — N179 Acute kidney failure, unspecified: Secondary | ICD-10-CM | POA: Diagnosis present

## 2022-10-02 DIAGNOSIS — K625 Hemorrhage of anus and rectum: Secondary | ICD-10-CM | POA: Diagnosis not present

## 2022-10-02 DIAGNOSIS — K760 Fatty (change of) liver, not elsewhere classified: Secondary | ICD-10-CM | POA: Diagnosis present

## 2022-10-02 DIAGNOSIS — F17210 Nicotine dependence, cigarettes, uncomplicated: Secondary | ICD-10-CM | POA: Diagnosis not present

## 2022-10-02 DIAGNOSIS — G02 Meningitis in other infectious and parasitic diseases classified elsewhere: Secondary | ICD-10-CM | POA: Diagnosis not present

## 2022-10-02 DIAGNOSIS — F419 Anxiety disorder, unspecified: Secondary | ICD-10-CM | POA: Diagnosis not present

## 2022-10-02 DIAGNOSIS — B9681 Helicobacter pylori [H. pylori] as the cause of diseases classified elsewhere: Secondary | ICD-10-CM | POA: Diagnosis not present

## 2022-10-02 DIAGNOSIS — F418 Other specified anxiety disorders: Secondary | ICD-10-CM | POA: Diagnosis not present

## 2022-10-02 DIAGNOSIS — G894 Chronic pain syndrome: Secondary | ICD-10-CM | POA: Diagnosis not present

## 2022-10-02 DIAGNOSIS — C7949 Secondary malignant neoplasm of other parts of nervous system: Secondary | ICD-10-CM | POA: Diagnosis present

## 2022-10-02 DIAGNOSIS — Z794 Long term (current) use of insulin: Secondary | ICD-10-CM | POA: Diagnosis not present

## 2022-10-02 DIAGNOSIS — E538 Deficiency of other specified B group vitamins: Secondary | ICD-10-CM | POA: Diagnosis not present

## 2022-10-02 DIAGNOSIS — G40919 Epilepsy, unspecified, intractable, without status epilepticus: Secondary | ICD-10-CM | POA: Diagnosis not present

## 2022-10-02 DIAGNOSIS — G939 Disorder of brain, unspecified: Secondary | ICD-10-CM | POA: Diagnosis not present

## 2022-10-02 DIAGNOSIS — E11649 Type 2 diabetes mellitus with hypoglycemia without coma: Secondary | ICD-10-CM | POA: Diagnosis not present

## 2022-10-02 DIAGNOSIS — R4701 Aphasia: Secondary | ICD-10-CM | POA: Diagnosis present

## 2022-10-02 DIAGNOSIS — C719 Malignant neoplasm of brain, unspecified: Secondary | ICD-10-CM | POA: Diagnosis not present

## 2022-10-02 DIAGNOSIS — R627 Adult failure to thrive: Secondary | ICD-10-CM | POA: Diagnosis not present

## 2022-10-02 LAB — GLUCOSE, CAPILLARY
Glucose-Capillary: 182 mg/dL — ABNORMAL HIGH (ref 70–99)
Glucose-Capillary: 275 mg/dL — ABNORMAL HIGH (ref 70–99)
Glucose-Capillary: 167 mg/dL — ABNORMAL HIGH (ref 70–99)
Glucose-Capillary: 168 mg/dL — ABNORMAL HIGH (ref 70–99)

## 2022-10-02 LAB — CBC
HCT: 40.2 % (ref 39.0–52.0)
Hemoglobin: 13.8 g/dL (ref 13.0–17.0)
MCH: 28.9 pg (ref 26.0–34.0)
MCHC: 34.3 g/dL (ref 30.0–36.0)
MCV: 84.1 fL (ref 80.0–100.0)
Platelets: 172 10*3/uL (ref 150–400)
RBC: 4.78 MIL/uL (ref 4.22–5.81)
RDW: 14.6 % (ref 11.5–15.5)
WBC: 11.6 10*3/uL — ABNORMAL HIGH (ref 4.0–10.5)
nRBC: 0 % (ref 0.0–0.2)

## 2022-10-02 LAB — BASIC METABOLIC PANEL
Anion gap: 8 (ref 5–15)
BUN: 24 mg/dL — ABNORMAL HIGH (ref 8–23)
CO2: 26 mmol/L (ref 22–32)
Calcium: 9.3 mg/dL (ref 8.9–10.3)
Chloride: 104 mmol/L (ref 98–111)
Creatinine, Ser: 0.93 mg/dL (ref 0.61–1.24)
GFR, Estimated: >60 mL/min (ref >60)
Glucose, Bld: 158 mg/dL — ABNORMAL HIGH (ref 70–99)
Potassium: 3.6 mmol/L (ref 3.5–5.1)
Sodium: 138 mmol/L (ref 135–145)

## 2022-10-02 LAB — CULTURE, BLOOD (ROUTINE X 2): Special Requests: ADEQUATE

## 2022-10-02 LAB — MAGNESIUM: Magnesium: 2 mg/dL (ref 1.7–2.4)

## 2022-10-02 LAB — VITAMIN B1: Vitamin B1 (Thiamine): 396.1 nmol/L — ABNORMAL HIGH (ref 66.5–200.0)
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
## 2022-10-02 NOTE — Progress Notes (Signed)
Jeremy Medina.  WUJ:[id number] DOB: Jan 08, 1951 DOA: 09/28/2022 PCP: [facility]    Brief Narrative:  72 year old with a history of DM2, HTN, HLD, chronic back pain on methadone, PTSD, depression/anxiety on chronic benzos who has been undergoing a workup for rapid functional decline and progressive encephalopathy since the end of January 2024.  Recent workup at the [facility] pointed to a diagnosis of NPH and the patient was referred to Neurology and Neurosurgery in the outpatient setting.  He was seen in the Neurosurgery office on the date of this admission, and sent to the ER as it was felt that he would benefit from an LP.  In the ER CT of the head was negative for acute findings.  CT cervical spine was without acute findings.  He appeared to be significantly dehydrated on physical exam and was found to have a BUN of 57 with creatinine of 2.32.  Notes indicate that the ER physician discussed the possibility of NPH with the on-call neurologist who suggested that the workup for this is best performed in the outpatient setting.  Consultants:  Neurology  Goals of Care:  Code Status: Full Code   DVT prophylaxis: Subcutaneous heparin  Interim Hx: The patient was seen in expert consultation by Dr. Otelia Limes yesterday who feels proceeding with high-volume LP is appropriate.  No acute events recorded overnight.  The patient is afebrile with stable vitals otherwise. Appears comfortable at the time of my visit. Alert but does not speak. Follows examiner with his eyes. Does not follow simple commands. In no apparent distress.   Assessment & Plan:  Progressive encephalopathy with significant functional decline Began at the end of January 2024 - was previously independent in function and ADLs and now no longer can live on his own - [place] workup has raised question of NPH - MRI 3/29 in our system negative for acute abnormality but when reviewed by Dr. Otelia Limes does in fact reveal findings that  are suggestive of NPH - MRI venogram negative for dural venous sinus thrombosis - B12 borderline low at 367 - TSH not deranged - RPR negative - ammonia normal after volume expansion - minimize sedating meds - Neuro seeing in consultation and fortunately has been very engaged in helping with this diagnostic challenge - plan at present is to obtain EEG and also undergo large volume LP for NPH eval   Acute kidney injury Creatinine 2.3 on admission with recent baseline 1.18 August 2022 - appears to have been simple prerenal azotemia -creatinine normalized with hydration -renal ultrasound unable to view right kidney but otherwise unrevealing  Modest B12 deficiency B12 367 - supplementing - goal of >400  Modest folic acid deficiency Folic acid 5.9 - supplementing  Mild transaminitis Likely simply due to hypoperfusion in setting of significant volume depletion - improved with simple hydration - continue to hold statin for now - recheck in AM   Leukocytosis UA not consistent with UTI - CXR with no focal infiltrate/active disease -WBC is steadily improving with simple volume expansion - afebrile - continue to follow trend  DM2 Utilizes insulin at home - A1c 9.7 -CBG controlled at present  HTN BP stable without medical therapy at present  Chronic back pain On chronic methadone -returned to usual home dose 4/14 as patient appeared to be in pain   Chronic anxiety/depression/PTSD Continue usual Ativan as it is chronically dosed but dose decreased to see if this will improve mental status  HLD Resume usual statin dose once LFTs  have normalized   Family Communication: no family present at time of exam today  Disposition: SNF rehab stay will be required though patient was living independently prior to this admission -will need to complete workup for NPH first   Objective: Blood pressure (!) 143/100, pulse 87, temperature 98 F (36.7 C), resp. rate 16, SpO2 96 %.  Intake/Output Summary (Last 24  hours) at 10/02/2022 0749 Last data filed at 10/01/2022 1100 Gross per 24 hour  Intake --  Output 400 ml  Net -400 ml    There were no vitals filed for this visit.  Examination: General: No acute respiratory distress Lungs: Clear to auscultation bilaterally - no wheeze  Cardiovascular: Regular rate and rhythm -no murmur Abdomen: Nontender, nondistended, soft, bowel sounds positive, no rebound Extremities: No signif edema B LE   CBC: Recent Labs  Lab 09/28/22 1555 09/28/22 1602 09/30/22 0500 10/01/22 0356 10/02/22 0459  WBC 21.0*   < > 17.7* 14.0* 11.6*  NEUTROABS 17.6*  --   --   --   --   HGB 15.8   < > 13.8 12.9* 13.8  HCT 48.4   < > 41.0 37.9* 40.2  MCV 86.3   < > 84.7 85.2 84.1  PLT 230   < > 159 144* 172   < > = values in this interval not displayed.    Basic Metabolic Panel: Recent Labs  Lab 09/29/22 0328 09/30/22 0500 10/01/22 0356 10/02/22 0459  NA 141 138 140 138  K 3.8 3.7 3.4* 3.6  CL 99 102 104 104  CO2 GLUCOSE 97 205* 191* 158*  BUN 55* 42* 33* 24*  CREATININE 1.54* 1.27* 1.07 0.93  CALCIUM 10.5* 9.3 9.3 9.3  MG 2.1  --   --  2.0    GFR: CrCl cannot be calculated (Unknown ideal weight.).   Scheduled Meds:  enoxaparin (LOVENOX) injection  40 mg Subcutaneous Q24H   folic acid  1 mg Oral Daily   insulin aspart  0-9 Units Subcutaneous TID WC   insulin glargine-yfgn  10 Units Subcutaneous Daily   lipase/protease/amylase  36,000 Units Oral TID AC   LORazepam  0.5 mg Oral QHS   methadone  10 mg Oral TID   sodium chloride flush  3 mL Intravenous Q12H   Continuous Infusions:  thiamine (VITAMIN B1) injection 250 mg (10/01/22 2313)     LOS: 0 days   Lonia Blood, MD [facility] Office  [phone] Pager - Text Page per Loretha Stapler  If 7PM-7AM, please contact night-coverage per Amion 10/02/2022, 7:49 AM

## 2022-10-02 NOTE — TOC Progression Note (Signed)
Transition of Care (TOC) - Progression Note    Patient Details  Name: Jeremy Medina. MRN: [id number] Date of Birth: 1950/08/03  Transition of Care [facility]) CM/SW Contact  Erin Sons, [name] Phone Number: 10/02/2022, 12:22 PM  Clinical Narrative:     CSW received call from APS worker Laquita [phone]). She is active with an open case for him from prior to this admission. Concerns about son caring for him. CSW updated Laquita that plan is for [facility] at d/c and that pt not currently medically ready for DC.    Expected Discharge Plan: Skilled Nursing Facility Barriers to Discharge: Continued Medical Work up  Expected Discharge Plan and Services In-house Referral: Clinical Social Work     Living arrangements for the past 2 months: Single Family Home                                       Social Determinants of Health (SDOH) Interventions SDOH Screenings   Food Insecurity: Patient Unable To Answer (09/29/2022)  Transportation Needs: Patient Unable To Answer (09/29/2022)  Utilities: Patient Unable To Answer (09/29/2022)  Depression (PHQ2-9): Low Risk  (04/27/2019)  Tobacco Use: High Risk (09/29/2022)    Readmission Risk Interventions     No data to display

## 2022-10-02 NOTE — Progress Notes (Signed)
Physical Therapy Treatment Patient Details Name: Jeremy Medina. MRN: [id number] DOB: [birth year] Today's Date: 10/02/2022   History of Present Illness Prabhjot Piscitello. is a 72 y.o. male who presents complaining of altered mental status and progressive decline.  MRI performed at [facility], concern for hydrocephalus and neurosurgeon recommended pt present to ED. Work-up pending. PMHx: type 2 diabetes, PTSD, chronic pain on methadone per son, hypertension, hyperlipidemia.    PT Comments    Pt received sitting in recliner and attempting to scoot his legs off legrest andout of the chair. 2+ max assist to boost posteriorly in seat and then set up for sit<>stand from recliner. Max+2 for sit<>standx2, max +2 to guide pivot chair>recliner with pt taking small shuffled steps from chair to bed. Manual cue at hip flexor to initiate lower to sit EOB required and total assist for safe return to supine. EOS pt resting supine in bed, alarm on and call bell within reach.    Recommendations for follow up therapy are one component of a multi-disciplinary discharge planning process, led by the attending physician.  Recommendations may be updated based on patient status, additional functional criteria and insurance authorization.  Follow Up Recommendations  Can patient physically be transported by private vehicle: No    Assistance Recommended at Discharge Frequent or constant Supervision/Assistance  Patient can return home with the following Two people to help with walking and/or transfers;A lot of help with bathing/dressing/bathroom;Assistance with cooking/housework;Help with stairs or ramp for entrance;Assist for transportation   Equipment Recommendations  Other (comment) (TBA)    Recommendations for Other Services       Precautions / Restrictions Precautions Precautions: Fall Restrictions Weight Bearing Restrictions: No     Mobility  Bed Mobility Overal bed mobility: Needs Assistance Bed  Mobility: Sit to Supine Rolling: +2 for safety/equipment, Max assist   Supine to sit: Max assist, +2 for physical assistance, +2 for safety/equipment, HOB elevated Sit to supine: Total assist, +2 for safety/equipment, +2 for physical assistance   General bed mobility comments: Total Assist +2 to control trunk to lower to bed and bring Bil LE's onto bed to return to supine. Mod assist with cues for use of bed rail to reach and roll Rt/Lt for pericare due to small BM.    Transfers Overall transfer level: Needs assistance Equipment used: 2 person hand held assist Transfers: Sit to/from Stand, Bed to chair/wheelchair/BSC Sit to Stand: Max assist, +2 physical assistance, +2 safety/equipment, Mod assist Stand pivot transfers: Max assist, +2 physical assistance, +2 safety/equipment         General transfer comment: Max +2 for sit<>stand x2 from recliner. Max+2 to maintain standing balance and guide pivot to EOB and control safe lower with pressure at hip to initiate flexion for sit.    Ambulation/Gait                   Stairs             Wheelchair Mobility    Modified Rankin (Stroke Patients Only)       Balance Overall balance assessment: Needs assistance Sitting-balance support: Feet supported, Bilateral upper extremity supported Sitting balance-Leahy Scale: Poor Sitting balance - Comments: reliant on UE support and Min assist with occasional guarding Postural control: Posterior lean Standing balance support: Bilateral upper extremity supported, During functional activity Standing balance-Leahy Scale: Poor Standing balance comment: Max A for stand and Max+2 for side steps bed>chair  Cognition Arousal/Alertness: Awake/alert Behavior During Therapy: Flat affect Overall Cognitive Status: No family/caregiver present to determine baseline cognitive functioning Area of Impairment: Orientation, Attention, Following commands,  Problem solving, Awareness                 Orientation Level: Disoriented to, Place, Time, Situation Current Attention Level: Focused   Following Commands: Follows one step commands inconsistently, Follows one step commands with increased time   Awareness: Intellectual Problem Solving: Slow processing, Decreased initiation, Difficulty sequencing, Requires verbal cues, Requires tactile cues General Comments: Pt presented to ED with AMS, no family present to detiermine baseline. Minimal verbal engagement throughout, pt able to answer appropriately with1-2 word responses <25% of the time. Did not initiate tasks or sequence despite multimodal cues. pt required full assistance for eating/drinking.        Exercises      General Comments General comments (skin integrity, edema, etc.): pt continues to have episodes of absent/blank gaze      Pertinent Vitals/Pain Pain Assessment Pain Assessment: PAINAD Breathing: normal Negative Vocalization: none Facial Expression: sad, frightened, frown Body Language: tense, distressed pacing, fidgeting Consolability: no need to console PAINAD Score: 2 Pain Location: buttocks Pain Descriptors / Indicators: Discomfort, Grimacing Pain Intervention(s): Limited activity within patient's tolerance, Monitored during session, Repositioned    Home Living                          Prior Function            PT Goals (current goals can now be found in the care plan section) Acute Rehab PT Goals Patient Stated Goal: unable to state PT Goal Formulation: Patient unable to participate in goal setting Time For Goal Achievement: 10/13/22 Potential to Achieve Goals: Fair Progress towards PT goals: Progressing toward goals (limited/slow)    Frequency    Min 2X/week      PT Plan Current plan remains appropriate    Co-evaluation              AM-PAC PT "6 Clicks" Mobility   Outcome Measure  Help needed turning from your back to  your side while in a flat bed without using bedrails?: Total Help needed moving from lying on your back to sitting on the side of a flat bed without using bedrails?: Total Help needed moving to and from a bed to a chair (including a wheelchair)?: Total Help needed standing up from a chair using your arms (e.g., wheelchair or bedside chair)?: Total Help needed to walk in hospital room?: Total Help needed climbing 3-5 steps with a railing? : Total 6 Click Score: 6    End of Session Equipment Utilized During Treatment: Gait belt Activity Tolerance: Patient tolerated treatment well Patient left: in bed;with call bell/phone within reach;with bed alarm set Nurse Communication: Mobility status;Need for lift equipment PT Visit Diagnosis: Unsteadiness on feet (R26.81);Muscle weakness (generalized) (M62.81)     Time: 1145-1200 PT Time Calculation (min) (ACUTE ONLY): 15 min  Charges:  $Therapeutic Activity: 8-22 mins                     Wynn Maudlin, DPT Acute Rehabilitation Services Office [phone]  10/02/22 12:10 PM

## 2022-10-02 NOTE — Progress Notes (Addendum)
Physical Therapy Treatment Patient Details Name: Jeremy Medina. MRN: [id number] DOB: 12-04-1950 Today's Date: 10/02/2022   History of Present Illness Jeremy Kluender. is a 72 y.o. male who presents complaining of altered mental status and progressive decline.  MRI performed at [facility], concern for hydrocephalus and neurosurgeon recommended pt present to ED. Work-up pending. PMHx: type 2 diabetes, PTSD, chronic pain on methadone per son, hypertension, hyperlipidemia    PT Comments    Patient received in bed, alert and fidgeting with primo fit and stuffed monkey brought in by family.  Primofit almost fully removed and therapist finished removing (RN notified EOS). Pt agreeable to sit up to EOB, verbal/tactile cues utilized for rolling Lt with pt reaching Lt UE to bed rail and able to reach Rt UE across to rail with Mod assist. Max assist required to bring LE's off EOB and Max +2 ultimately to press up trunk and stabilize balance at EOB. Pt required min assist for seated balance with intermittent guarding. 2x sit<>stand with Mod +2 assist performed and Total assist for pericare. 1x sit<>stand with max +1 assist to rise for final pericare, pt with strong posterior lean throughout standing. Stand pivot transfer completed bed>chair with Max +2 HHA and therapists guiding weight shift for stepping Rt/Lt LE's. EOS pt repositioned in recliner with blankets at knees to prevent anterior slide and provided activity belt, stuffed, monkey, and breakfast tray. Pt required Max Assist for eating and demonstrated no initiation to drink or eat food but when provided utensil with egg reached with Lt UE to bring to mouth 1x and when straw brought to pt's mouth pt opened to drink small sip. NT notified pt will need FULL Assistance with eating/drinking. EOS alarm on and call bell within reach, pt watching TV and appeared to nbe more restful and comfortable. Will continue to progress as able.   Recommendations for follow  up therapy are one component of a multi-disciplinary discharge planning process, led by the attending physician.  Recommendations may be updated based on patient status, additional functional criteria and insurance authorization.  Follow Up Recommendations  Can patient physically be transported by private vehicle: No    Assistance Recommended at Discharge Frequent or constant Supervision/Assistance  Patient can return home with the following Two people to help with walking and/or transfers;A lot of help with bathing/dressing/bathroom;Assistance with cooking/housework;Help with stairs or ramp for entrance;Assist for transportation   Equipment Recommendations  Other (comment) (TBA)    Recommendations for Other Services       Precautions / Restrictions Precautions Precautions: Fall Restrictions Weight Bearing Restrictions: No     Mobility  Bed Mobility Overal bed mobility: Needs Assistance Bed Mobility: Rolling, Supine to Sit Rolling: +2 for safety/equipment, Max assist   Supine to sit: Max assist, +2 for physical assistance, +2 for safety/equipment, HOB elevated     General bed mobility comments: Pt initiated Lt UE reach to bed rail and Mod assist to bring Rt hand to Lt. Max to bring bil LE's off EOB and Max+2 for press up of trunk and to obtain full sitting balance EOB.    Transfers Overall transfer level: Needs assistance Equipment used: 2 person hand held assist Transfers: Sit to/from Stand, Bed to chair/wheelchair/BSC Sit to Stand: Max assist, +2 physical assistance, +2 safety/equipment, Mod assist Stand pivot transfers: Max assist, +2 physical assistance, +2 safety/equipment         General transfer comment: Mod +2 for sit<>stand form EOB with bil HHA and facilitation at  hip for extension/upright posture. Pt completed 2x with 2+ for pericare due to incontinent BM. 1x sit<>stand with Max assist and +2 for sfety and pericare. Pt noted to have posterior lean with all  stands and manual/tactile cue at hip required to break extension and facilitate flexion for return to sitting. Max+2 for stand and pivot bed>chair, therapist caging Lt LE and guiding small scoots/steps on Lt and 2nd therapist guiding weight shift for steps with Rt LE to move to recliner. EOS pt repositioned with LE's elevated and blocked with blankets under knees to prevent anterior scoot out of chair. Pt provided stuffed monkey, activity belt, and tray with breakfast to reduce fidgeting and encourage rest in recliner. TV on and pt appeared to be engaged in watching at EOS.    Ambulation/Gait                   Stairs             Wheelchair Mobility    Modified Rankin (Stroke Patients Only)       Balance Overall balance assessment: Needs assistance Sitting-balance support: Feet supported, Bilateral upper extremity supported Sitting balance-Leahy Scale: Poor Sitting balance - Comments: reliant on UE support and Min assist with occasional guarding Postural control: Posterior lean Standing balance support: Bilateral upper extremity supported, During functional activity Standing balance-Leahy Scale: Poor Standing balance comment: Max A for stand and Max+2 for side steps bed>chair                            Cognition Arousal/Alertness: Awake/alert Behavior During Therapy: Flat affect Overall Cognitive Status: No family/caregiver present to determine baseline cognitive functioning Area of Impairment: Orientation, Attention, Following commands, Problem solving, Awareness                 Orientation Level: Disoriented to, Place, Time, Situation Current Attention Level: Focused   Following Commands: Follows one step commands inconsistently, Follows one step commands with increased time   Awareness: Intellectual Problem Solving: Slow processing, Decreased initiation, Difficulty sequencing, Requires verbal cues, Requires tactile cues General Comments: Pt  presented to ED with AMS, no family present to detiermine baseline. Minimal verbal engagement throughout, pt able to answer appropriately with1-2 word responses <25% of the time. Did not initiate tasks or sequence despite multimodal cues. pt required full assistance for eating/drinking.        Exercises      General Comments General comments (skin integrity, edema, etc.): pt with multiple episodes of blank gaze during mobility while sitting EOB and in recliner. no tremors noted during rest. Pt minimally verbal throughout and not forthocoming with symptoms secondary to aphasia and AMS. RN notified. Pt also noted to have sore hemorroid.      Pertinent Vitals/Pain Pain Assessment Breathing: normal Negative Vocalization: none Facial Expression: sad, frightened, frown Body Language: tense, distressed pacing, fidgeting Consolability: no need to console PAINAD Score: 2 Pain Location: back and buttocks Pain Descriptors / Indicators: Discomfort, Grimacing Pain Intervention(s): Limited activity within patient's tolerance, Monitored during session, Repositioned    Home Living                          Prior Function            PT Goals (current goals can now be found in the care plan section) Acute Rehab PT Goals Patient Stated Goal: unable to state PT Goal Formulation: Patient unable to  participate in goal setting Time For Goal Achievement: 10/13/22 Potential to Achieve Goals: Fair Progress towards PT goals: Progressing toward goals (limited/slow)    Frequency    Min 2X/week      PT Plan Current plan remains appropriate    Co-evaluation              AM-PAC PT "6 Clicks" Mobility   Outcome Measure  Help needed turning from your back to your side while in a flat bed without using bedrails?: Total Help needed moving from lying on your back to sitting on the side of a flat bed without using bedrails?: Total Help needed moving to and from a bed to a chair  (including a wheelchair)?: Total Help needed standing up from a chair using your arms (e.g., wheelchair or bedside chair)?: Total Help needed to walk in hospital room?: Total Help needed climbing 3-5 steps with a railing? : Total 6 Click Score: 6    End of Session Equipment Utilized During Treatment: Gait belt Activity Tolerance: Patient tolerated treatment well Patient left: with call bell/phone within reach;in chair;with chair alarm set;  Nurse Communication: Mobility status;Need for lift equipment PT Visit Diagnosis: Unsteadiness on feet (R26.81);Muscle weakness (generalized) (M62.81)     Time: 7829-5621 PT Time Calculation (min) (ACUTE ONLY): 33 min  Charges:  $Therapeutic Activity: 23-37 mins                      Wynn Maudlin, DPT Acute Rehabilitation Services Office [phone]  10/02/22 10:10 AM

## 2022-10-02 NOTE — Procedures (Signed)
Patient Name: Kiondre Grenz.  MRN: [id number]  Epilepsy Attending: Charlsie Quest  Referring Physician/Provider: Caryl Pina, MD  Date: 10/01/2022 Duration: 29.59 mins  Patient history: 72 year old male presenting with progressively worsening cognition and falls. EEG to evaluate for seizure.  Level of alertness: Awake  AEDs during EEG study: None  Technical aspects: This EEG study was done with scalp electrodes positioned according to the 10-20 International system of electrode placement. Electrical activity was reviewed with band pass filter of 1-70Hz , sensitivity of 7 uV/mm, display speed of 34mm/sec with a  notched filter applied as appropriate. EEG data were recorded continuously and digitally stored.  Video monitoring was available and reviewed as appropriate.  Description: No clear posterior dominant rhythm was seen. EEG showed continuous generalized 3 to 6 Hz theta-delta slowing. Physiologic photic driving was not seen during photic stimulation. Hyperventilation was not performed.     ABNORMALITY - Continuous slow, generalized  IMPRESSION: This study is suggestive of moderate diffuse encephalopathy. No seizures or epileptiform discharges were seen throughout the recording.  Jimi Schappert Annabelle Harman

## 2022-10-02 NOTE — Progress Notes (Signed)
Mobility Specialist - Progress Note   10/02/22 1129  Mobility  Activity Repositioned in chair  Level of Assistance Maximum assist, patient does 25-49%  Assistive Device None  Activity Response Tolerated well  Mobility Referral No  $Mobility charge 1 Mobility   Pt was received in chair with legs hanging off side of chair and slide down. Pt required assist getting legs back on recliner and slide back in chair. No complaints. Pt was left in chair with all needs met.   Alda Lea  Mobility Specialist Please contact via Special educational needs teacher or Rehab office at [phone]

## 2022-10-03 ENCOUNTER — Inpatient Hospital Stay (HOSPITAL_COMMUNITY): Payer: No Typology Code available for payment source

## 2022-10-03 DIAGNOSIS — G9340 Encephalopathy, unspecified: Secondary | ICD-10-CM | POA: Diagnosis not present

## 2022-10-03 DIAGNOSIS — E119 Type 2 diabetes mellitus without complications: Secondary | ICD-10-CM

## 2022-10-03 DIAGNOSIS — R7401 Elevation of levels of liver transaminase levels: Secondary | ICD-10-CM | POA: Insufficient documentation

## 2022-10-03 DIAGNOSIS — F419 Anxiety disorder, unspecified: Secondary | ICD-10-CM | POA: Diagnosis not present

## 2022-10-03 DIAGNOSIS — E785 Hyperlipidemia, unspecified: Secondary | ICD-10-CM

## 2022-10-03 DIAGNOSIS — R4182 Altered mental status, unspecified: Secondary | ICD-10-CM | POA: Diagnosis not present

## 2022-10-03 DIAGNOSIS — E669 Obesity, unspecified: Secondary | ICD-10-CM | POA: Insufficient documentation

## 2022-10-03 DIAGNOSIS — G912 (Idiopathic) normal pressure hydrocephalus: Secondary | ICD-10-CM | POA: Diagnosis not present

## 2022-10-03 DIAGNOSIS — I10 Essential (primary) hypertension: Secondary | ICD-10-CM

## 2022-10-03 DIAGNOSIS — E538 Deficiency of other specified B group vitamins: Secondary | ICD-10-CM | POA: Insufficient documentation

## 2022-10-03 DIAGNOSIS — N179 Acute kidney failure, unspecified: Secondary | ICD-10-CM | POA: Diagnosis not present

## 2022-10-03 LAB — COMPREHENSIVE METABOLIC PANEL
ALT: 45 U/L — ABNORMAL HIGH (ref 0–44)
AST: 30 U/L (ref 15–41)
Albumin: 3.1 g/dL — ABNORMAL LOW (ref 3.5–5.0)
Alkaline Phosphatase: 66 U/L (ref 38–126)
Anion gap: 13 (ref 5–15)
BUN: 22 mg/dL (ref 8–23)
CO2: 23 mmol/L (ref 22–32)
Calcium: 9.6 mg/dL (ref 8.9–10.3)
Chloride: 103 mmol/L (ref 98–111)
Creatinine, Ser: 0.93 mg/dL (ref 0.61–1.24)
GFR, Estimated: >60 mL/min (ref >60)
Glucose, Bld: 166 mg/dL — ABNORMAL HIGH (ref 70–99)
Potassium: 3.7 mmol/L (ref 3.5–5.1)
Sodium: 139 mmol/L (ref 135–145)
Total Bilirubin: 0.6 mg/dL (ref 0.3–1.2)
Total Protein: 6.5 g/dL (ref 6.5–8.1)

## 2022-10-03 LAB — GLUCOSE, CAPILLARY
Glucose-Capillary: 208 mg/dL — ABNORMAL HIGH (ref 70–99)
Glucose-Capillary: 343 mg/dL — ABNORMAL HIGH (ref 70–99)
Glucose-Capillary: 169 mg/dL — ABNORMAL HIGH (ref 70–99)
Glucose-Capillary: 188 mg/dL — ABNORMAL HIGH (ref 70–99)

## 2022-10-03 LAB — CBC
HCT: 44.3 % (ref 39.0–52.0)
Hemoglobin: 14.4 g/dL (ref 13.0–17.0)
MCH: 27.7 pg (ref 26.0–34.0)
MCHC: 32.5 g/dL (ref 30.0–36.0)
MCV: 85.4 fL (ref 80.0–100.0)
Platelets: 185 10*3/uL (ref 150–400)
RBC: 5.19 MIL/uL (ref 4.22–5.81)
RDW: 14.6 % (ref 11.5–15.5)
WBC: 12.2 10*3/uL — ABNORMAL HIGH (ref 4.0–10.5)
nRBC: 0 % (ref 0.0–0.2)

## 2022-10-03 LAB — CSF CELL COUNT WITH DIFFERENTIAL
Eosinophils, CSF: 0 % (ref 0–1)
Lymphs, CSF: 13 % — ABNORMAL LOW (ref 40–80)
Monocyte-Macrophage-Spinal Fluid: 87 % — ABNORMAL HIGH (ref 15–45)
RBC Count, CSF: 18 /mm3 — ABNORMAL HIGH
Segmented Neutrophils-CSF: 0 % (ref 0–6)
Tube #: 1
WBC, CSF: 60 /mm3 (ref 0–5)

## 2022-10-03 LAB — PROTEIN AND GLUCOSE, CSF
Glucose, CSF: 67 mg/dL (ref 40–70)
Total  Protein, CSF: 365 mg/dL — ABNORMAL HIGH (ref 15–45)

## 2022-10-03 LAB — CSF CULTURE W GRAM STAIN

## 2022-10-03 LAB — CULTURE, BLOOD (ROUTINE X 2)

## 2022-10-03 LAB — CRYPTOCOCCAL ANTIGEN, CSF: Crypto Ag: NEGATIVE

## 2022-10-03 MED ORDER — LIDOCAINE HCL (PF) 1 % IJ SOLN
5.0000 mL | Freq: Once | INTRAMUSCULAR | Status: AC
  Administered 2022-10-03: 5 mL via INTRADERMAL
  Filled 2022-10-03: qty 5

## 2022-10-03 NOTE — Progress Notes (Signed)
Physical Therapy Treatment Patient Details Name: Jeremy Medina. MRN: [id number] DOB: 09/14/1950 Today's Date: 10/03/2022   History of Present Illness Jeremy Medina. is a 72 y.o. male who presents complaining of altered mental status and progressive decline.  MRI performed at [facility], concern for hydrocephalus and neurosurgeon recommended pt present to ED. Work-up pending. LP 10/03/2022. PMHx: type 2 diabetes, PTSD, chronic pain on methadone per son, hypertension, hyperlipidemia    PT Comments    PT providing reassessment after lumbar puncture this afternoon. Pt's performance does appear improved this session, with much less significant posterior lean in sitting and standing. Pt does continue to struggle with initiation of mobility, although he does communicate more frequently this session and is able to provide his date of birth. Pt progress to ambulation during this session, with very short shuffling gait, requiring initiation to continue forward momentum. PT feels this is a significant improvement compared to this morning and prior sessions during this admission. PT will follow up once again later this evening.   Recommendations for follow up therapy are one component of a multi-disciplinary discharge planning process, led by the attending physician.  Recommendations may be updated based on patient status, additional functional criteria and insurance authorization.  Follow Up Recommendations  Can patient physically be transported by private vehicle: No    Assistance Recommended at Discharge Frequent or constant Supervision/Assistance  Patient can return home with the following Two people to help with walking and/or transfers;A lot of help with bathing/dressing/bathroom;Assistance with cooking/housework;Help with stairs or ramp for entrance;Assist for transportation   Equipment Recommendations   (defer to post-acute)    Recommendations for Other Services       Precautions /  Restrictions Precautions Precautions: Fall Precaution Comments: posterior lean Restrictions Weight Bearing Restrictions: No     Mobility  Bed Mobility Overal bed mobility: Needs Assistance Bed Mobility: Rolling, Sidelying to Sit, Sit to Supine Rolling: Max assist Sidelying to sit: +2 for physical assistance, Max assist   Sit to supine: Max assist, +2 for physical assistance   General bed mobility comments: pt requires significant assistance to initiate mobility, posterior lean is less significant and pt seems to respond to verbal cues to increased trunk flexion    Transfers Overall transfer level: Needs assistance Equipment used: 2 person hand held assist Transfers: Sit to/from Stand, Bed to chair/wheelchair/BSC Sit to Stand: Mod assist, +2 physical assistance   Step pivot transfers: Mod assist, +2 physical assistance       General transfer comment: pt with poor initiation of sit to stand, requries tactile cues and physical facilitation of trunk lean to start sit to stand. Posterior lean is much less significant this session once standing, RW seems to assist in anterior lean. Shuffling steps with step pivot transfer, PT assisting with weight shift to aide in initiation of steps    Ambulation/Gait Ambulation/Gait assistance: +2 physical assistance, Mod assist Gait Distance (Feet): 12 Feet Assistive device: Rolling walker (2 wheels) Gait Pattern/deviations: Shuffle Gait velocity: reduced Gait velocity interpretation: <1.31 ft/sec, indicative of household ambulator   General Gait Details: pt with short shuffling steps, minimal to no foot clearance noted. PT initially assisting with weight shift at hips to aide in initiation of stepping. Pt appears uncomfortable with this method. PT then assists by pushing RW in desired direction, pt tends to follow with short shuffling steps   Stairs             Wheelchair Mobility    Modified Rankin (  Stroke Patients Only)        Balance Overall balance assessment: Needs assistance Sitting-balance support: Single extremity supported, Feet supported Sitting balance-Leahy Scale: Poor Sitting balance - Comments: modA initially progresses to minA posterior lean Postural control: Posterior lean Standing balance support: Bilateral upper extremity supported Standing balance-Leahy Scale: Poor Standing balance comment: minA due to posterior lean                            Cognition Arousal/Alertness: Awake/alert Behavior During Therapy: Flat affect Overall Cognitive Status: Difficult to assess Area of Impairment: Orientation, Attention, Memory, Following commands, Safety/judgement, Awareness, Problem solving                 Orientation Level: Disoriented to, Place, Time, Situation (pt is able to report his date of birth this session) Current Attention Level: Focused   Following Commands: Follows one step commands inconsistently, Follows one step commands with increased time Safety/Judgement: Decreased awareness of safety, Decreased awareness of deficits Awareness: Intellectual Problem Solving: Slow processing, Decreased initiation, Requires verbal cues, Requires tactile cues, Difficulty sequencing General Comments: pt continues to demonstrate difficulty initiating movement, often requires tactile cues in addition to verbal cueing        Exercises      General Comments General comments (skin integrity, edema, etc.): VSS on RA      Pertinent Vitals/Pain Pain Assessment Pain Assessment: Faces Faces Pain Scale: Hurts little more Pain Location: peri area with sitting Pain Descriptors / Indicators: Grimacing Pain Intervention(s): Monitored during session    Home Living                          Prior Function            PT Goals (current goals can now be found in the care plan section) Acute Rehab PT Goals Patient Stated Goal: unable to state Progress towards PT goals:  Progressing toward goals    Frequency    Min 2X/week      PT Plan Current plan remains appropriate    Co-evaluation PT/OT/SLP Co-Evaluation/Treatment: Yes Reason for Co-Treatment: Complexity of the patient's impairments (multi-system involvement);Necessary to address cognition/behavior during functional activity;For patient/therapist safety;To address functional/ADL transfers PT goals addressed during session: Mobility/safety with mobility;Balance;Proper use of DME;Strengthening/ROM        AM-PAC PT "6 Clicks" Mobility   Outcome Measure  Help needed turning from your back to your side while in a flat bed without using bedrails?: Total Help needed moving from lying on your back to sitting on the side of a flat bed without using bedrails?: Total Help needed moving to and from a bed to a chair (including a wheelchair)?: A Lot Help needed standing up from a chair using your arms (e.g., wheelchair or bedside chair)?: A Lot Help needed to walk in hospital room?: Total Help needed climbing 3-5 steps with a railing? : Total 6 Click Score: 8    End of Session Equipment Utilized During Treatment: Gait belt Activity Tolerance: Patient tolerated treatment well Patient left: in bed;with call bell/phone within reach;with bed alarm set Nurse Communication: Mobility status PT Visit Diagnosis: Unsteadiness on feet (R26.81);Muscle weakness (generalized) (M62.81)     Time: 1610-9604 PT Time Calculation (min) (ACUTE ONLY): 23 min  Charges:  $Therapeutic Activity: 8-22 mins                     Arlyss Gandy,  PT, DPT Acute Rehabilitation Office [phone]    Arlyss Gandy 10/03/2022, 4:02 PM

## 2022-10-03 NOTE — Procedures (Signed)
Successful LP from L2-3 Opening pressure 36 cm H20. Clear colorless CSF obtained. Large volume requested but could only obtain approx 15 mL total before flow slowed to a halt Closing pressure 10 cm H20 No complications.  Brayton El PA-C Interventional Radiology 10/03/2022 2:08 PM

## 2022-10-03 NOTE — Assessment & Plan Note (Signed)
B12 deficiency ruled out - Continue folate supplement
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
## 2022-10-03 NOTE — Inpatient Diabetes Management (Signed)
Inpatient Diabetes Program Recommendations  AACE/ADA: New Consensus Statement on Inpatient Glycemic Control  Target Ranges:  Prepandial:   less than 140 mg/dL      Peak postprandial:   less than 180 mg/dL (1-2 hours)      Critically ill patients:  140 - 180 mg/dL    Latest Reference Range & Units 10/02/22 08:28 10/02/22 11:42 10/02/22 16:51 10/02/22 21:34 10/03/22 08:27 10/03/22 11:34  Glucose-Capillary 70 - 99 mg/dL 086 (H) 578 (H) 469 (H) 168 (H) 208 (H) 343 (H)   Review of Glycemic Control  Diabetes history: DM2 Outpatient Diabetes medications: 70/30 0-20 units TID with meals Current orders for Inpatient glycemic control: Semglee 10 units daily, Novolog 0-9 units TID with meals  Inpatient Diabetes Program Recommendations:    Insulin: Please consider increasing Semglee to 15 units daily, adding Novolog 0-5 units QHS, and ordering Novolog 3 units TID with meals for meal coverage if patient eats at least 50% of meals.   Thanks, Orlando Penner, RN, MSN, CDCES Diabetes Coordinator Inpatient Diabetes Program [phone] (Team Pager from 8am to 5pm)

## 2022-10-03 NOTE — Assessment & Plan Note (Addendum)
BP slightly elevated - Continue amlodipine - Hold Lisinopril

## 2022-10-03 NOTE — Progress Notes (Signed)
Occupational Therapy Treatment Patient Details Name: Jeremy Medina. MRN: [id number] DOB: [birth year] Today's Date: 10/03/2022   History of present illness Jeremy Medina. is a 72 y.o. male who presents complaining of altered mental status and progressive decline.  MRI performed at [facility], concern for hydrocephalus and neurosurgeon recommended pt present to ED. Work-up pending. LP 10/03/2022. PMHx: type 2 diabetes, PTSD, chronic pain on methadone per son, hypertension, hyperlipidemia   OT comments  Pt seen with PT s/p LP per [facility] protocol. Notable improvement in communication, cognition, transfers, balance and mobility. Overall he needed mod A +2 for transfers but transitioned to taking shuffling steps with just +1 at RW. He continues to demonstrate difficulty with initiation, attention and sequencing, needing maximal multimodal cues for all tasks. He also was more communicative this date answering yes/no questions ~25% of the time and stating short phrases. OT to continue to follow acutely with plans for skilled inpatient follow up therapy, <3 hours/day.    Recommendations for follow up therapy are one component of a multi-disciplinary discharge planning process, led by the attending physician.  Recommendations may be updated based on patient status, additional functional criteria and insurance authorization.    Assistance Recommended at Discharge Frequent or constant Supervision/Assistance  Patient can return home with the following  A lot of help with walking and/or transfers;Two people to help with walking and/or transfers;A lot of help with bathing/dressing/bathroom;Two people to help with bathing/dressing/bathroom;Direct supervision/assist for medications management;Direct supervision/assist for financial management;Assist for transportation;Help with stairs or ramp for entrance;Assistance with feeding;Assistance with cooking/housework   Equipment Recommendations  None recommended by OT        Precautions / Restrictions Precautions Precautions: Fall Precaution Comments: posterior lean Restrictions Weight Bearing Restrictions: No       Mobility Bed Mobility Overal bed mobility: Needs Assistance Bed Mobility: Rolling, Sidelying to Sit, Sit to Supine Rolling: Max assist Sidelying to sit: +2 for physical assistance, Max assist Supine to sit: Total assist, +2 for safety/equipment, +2 for physical assistance Sit to supine: Max assist, +2 for physical assistance   General bed mobility comments: multimodal cues given but ultimately needs total A +2 to initate and sequence task    Transfers Overall transfer level: Needs assistance Equipment used: 2 person hand held assist Transfers: Sit to/from Stand, Bed to chair/wheelchair/BSC Sit to Stand: Mod assist, +2 physical assistance     Step pivot transfers: Mod assist, +2 physical assistance     General transfer comment: pt with poor initiation of sit to stand, requries tactile cues and physical facilitation of trunk lean to start sit to stand. Posterior lean is much less significant this session once standing, RW seems to assist in anterior lean. Shuffling steps with step pivot transfer, PT assisting with weight shift to aide in initiation of steps     Balance Overall balance assessment: Needs assistance Sitting-balance support: Single extremity supported, Feet supported Sitting balance-Leahy Scale: Poor Sitting balance - Comments: modA initially progresses to minA posterior lean Postural control: Posterior lean Standing balance support: Bilateral upper extremity supported Standing balance-Leahy Scale: Poor Standing balance comment: minA due to posterior lean                           ADL either performed or assessed with clinical judgement   ADL Overall ADL's : Needs assistance/impaired Eating/Feeding: Maximal assistance Eating/Feeding Details (indicate cue type and reason): approached pt with a tooth  brush, he greasped but did  not initiate oral hygiene. When brought to his mouth, he resisted and then dropped brush on the ground                     Toilet Transfer: Moderate assistance;+2 for safety/equipment;+2 for physical assistance;Ambulation Toilet Transfer Details (indicate cue type and reason): simulated with walking to the door from the bed with RW Toileting- Clothing Manipulation and Hygiene: Total assistance;+2 for safety/equipment;Sit to/from stand       Functional mobility during ADLs: Moderate assistance;+2 for physical assistance;+2 for safety/equipment General ADL Comments: vast improvement in mobility adn communication  this session    Extremity/Trunk Assessment Upper Extremity Assessment Upper Extremity Assessment: Difficult to assess due to impaired cognition RUE Deficits / Details: Seemingly WFL LUE Deficits / Details: seeminly WFL MMT and ROM   Lower Extremity Assessment Lower Extremity Assessment: Defer to PT evaluation        Vision   Vision Assessment?: No apparent visual deficits Additional Comments: seemingly [facility]   Perception Perception Perception: Not tested   Praxis Praxis Praxis: Not tested    Cognition Arousal/Alertness: Awake/alert Behavior During Therapy: Flat affect Overall Cognitive Status: Difficult to assess Area of Impairment: Orientation, Attention, Memory, Following commands, Safety/judgement, Awareness, Problem solving                 Orientation Level: Disoriented to, Place, Situation, Time Current Attention Level: Focused   Following Commands: Follows one step commands inconsistently Safety/Judgement: Decreased awareness of deficits, Decreased awareness of safety Awareness: Intellectual Problem Solving: Slow processing, Decreased initiation, Requires verbal cues, Difficulty sequencing General Comments: improved communication / cognition this session when compared to pre LP. Pt able to state his name and answer  yes/no appropriately 25% of the time. He continues to have significant difficulty with initiating and sequencing funcitonal tasks but did better with transfers and mobility        Exercises      Shoulder Instructions       General Comments VSS on RA    Pertinent Vitals/ Pain       Pain Assessment Pain Assessment: Faces Faces Pain Scale: Hurts little more Pain Location: peri area with sitting Pain Descriptors / Indicators: Grimacing Pain Intervention(s): Limited activity within patient's tolerance, Monitored during session  Home Living                                          Prior Functioning/Environment              Frequency  Min 1X/week        Progress Toward Goals  OT Goals(current goals can now be found in the care plan section)  Progress towards OT goals: Progressing toward goals  Acute Rehab OT Goals Patient Stated Goal: did not state OT Goal Formulation: With patient Time For Goal Achievement: 09/29/22 Potential to Achieve Goals: Good ADL Goals Pt Will Perform Grooming: with mod assist;sitting Pt Will Perform Upper Body Dressing: with mod assist;sitting Pt Will Transfer to Toilet: with mod assist;bedside commode;stand pivot transfer Additional ADL Goal #1: Pt will follow simple 1 step commands 50% of the time to complete ADL task  Plan Discharge plan remains appropriate    Co-evaluation      Reason for Co-Treatment: Complexity of the patient's impairments (multi-system involvement);Necessary to address cognition/behavior during functional activity;For patient/therapist safety;To address functional/ADL transfers PT goals addressed during session: Mobility/safety with  mobility;Balance;Proper use of DME;Strengthening/ROM OT goals addressed during session: ADL's and self-care      AM-PAC OT "6 Clicks" Daily Activity     Outcome Measure   Help from another person eating meals?: Total Help from another person taking care of  personal grooming?: A Lot Help from another person toileting, which includes using toliet, bedpan, or urinal?: A Lot Help from another person bathing (including washing, rinsing, drying)?: A Lot Help from another person to put on and taking off regular upper body clothing?: A Lot Help from another person to put on and taking off regular lower body clothing?: A Lot 6 Click Score: 11    End of Session Equipment Utilized During Treatment: Gait belt;Rolling walker (2 wheels)  OT Visit Diagnosis: Unsteadiness on feet (R26.81);Other abnormalities of gait and mobility (R26.89);Muscle weakness (generalized) (M62.81);Pain   Activity Tolerance Patient tolerated treatment well   Patient Left in bed;with call bell/phone within reach;with bed alarm set   Nurse Communication Mobility status        Time: 5284-1324 OT Time Calculation (min): 23 min  Charges: OT General Charges $OT Visit: 1 Visit OT Treatments $Therapeutic Activity: 8-22 mins  Derenda Mis, OTR/L Acute Rehabilitation Services Office [phone] Secure Chat Communication Preferred   Donia Pounds 10/03/2022, 5:26 PM

## 2022-10-03 NOTE — Assessment & Plan Note (Addendum)
Mild. - Check hepatitis serologies - Obtain US abdomen

## 2022-10-03 NOTE — Assessment & Plan Note (Signed)
Hold home Crestor °

## 2022-10-03 NOTE — Progress Notes (Signed)
Physical Therapy Treatment Patient Details Name: Jeremy Medina. MRN: [id number] DOB: [birth year] Today's Date: 10/03/2022   History of Present Illness Jeremy Medina. is a 72 y.o. male who presents complaining of altered mental status and progressive decline.  MRI performed at [facility], concern for hydrocephalus and neurosurgeon recommended pt present to ED. Work-up pending. LP 10/03/2022. PMHx: type 2 diabetes, PTSD, chronic pain on methadone per son, hypertension, hyperlipidemia    PT Comments    Pt tolerates treatment well, responding well to presence of his son. Pt is able to tolerate increased distances of ambulation this session, continuing to shuffle and with difficulty sequencing gait. Pt continues to vocalize more frequently but is unable to state his date of birth at this time. Pt does quickly follow command to "cross arms" at the end of session. PT notes continued improvement in sitting balance, although standing balance appears to decline later in walk, likely related to fatigue. Pt's son appears to notice a significant improvement from his last visit. PT continues to recommend short term inpatient PT services at the time of discharge.   Recommendations for follow up therapy are one component of a multi-disciplinary discharge planning process, led by the attending physician.  Recommendations may be updated based on patient status, additional functional criteria and insurance authorization.  Follow Up Recommendations  Can patient physically be transported by private vehicle: No    Assistance Recommended at Discharge Frequent or constant Supervision/Assistance  Patient can return home with the following Two people to help with walking and/or transfers;A lot of help with bathing/dressing/bathroom;Assistance with cooking/housework;Help with stairs or ramp for entrance;Assist for transportation   Equipment Recommendations   (defer to post acute)    Recommendations for Other Services        Precautions / Restrictions Precautions Precautions: Fall Precaution Comments: posterior lean Restrictions Weight Bearing Restrictions: No     Mobility  Bed Mobility Overal bed mobility: Needs Assistance Bed Mobility: Rolling, Sidelying to Sit, Sit to Supine Rolling: Max assist Sidelying to sit: +2 for physical assistance, Max assist   Sit to supine: Max assist, +2 for physical assistance   General bed mobility comments: pt requires significant assistance to initiate mobility, posterior lean is less significant and pt seems to respond to verbal cues to increased trunk flexion    Transfers Overall transfer level: Needs assistance Equipment used: Rolling walker (2 wheels) Transfers: Sit to/from Stand Sit to Stand: Mod assist, +2 physical assistance   Step pivot transfers: Mod assist, +2 physical assistance       General transfer comment: pt with poor initiation of sit to stand, requries tactile cues and physical facilitation of trunk lean to start sit to stand. Posterior lean is much less significant this session once standing, RW seems to assist in anterior lean. Shuffling steps with step pivot transfer, PT assisting with weight shift to aide in initiation of steps    Ambulation/Gait Ambulation/Gait assistance: Mod assist, Max assist Gait Distance (Feet): 20 Feet Assistive device: Rolling walker (2 wheels) Gait Pattern/deviations: Shuffle Gait velocity: reduced Gait velocity interpretation: <1.31 ft/sec, indicative of household ambulator   General Gait Details: pt with short shuffling steps, does appear to initiate first 2-3 steps well and then later initiates a few more steps with visual/verbal cues from son. Otherwise pt requires physical assist to initiate via weight shifting laterally or with advancement of RW. Pt does tend to have increased posterior lean with fatigue during last half of walk   Stairs  Wheelchair Mobility    Modified  Rankin (Stroke Patients Only)       Balance Overall balance assessment: Needs assistance Sitting-balance support: No upper extremity supported, Feet supported Sitting balance-Leahy Scale: Fair Sitting balance - Comments: posterior lean initially, improves to minG with increased time sitting Postural control: Posterior lean Standing balance support: Bilateral upper extremity supported, Reliant on assistive device for balance Standing balance-Leahy Scale: Poor Standing balance comment: min-modA, posterior lean                            Cognition Arousal/Alertness: Awake/alert Behavior During Therapy: Flat affect Overall Cognitive Status: Difficult to assess Area of Impairment: Orientation, Attention, Memory, Following commands, Safety/judgement, Awareness, Problem solving                 Orientation Level:  (pt is unable to state DOB this session) Current Attention Level: Focused   Following Commands: Follows one step commands inconsistently, Follows one step commands with increased time Safety/Judgement: Decreased awareness of safety, Decreased awareness of deficits Awareness: Intellectual Problem Solving: Slow processing, Decreased initiation, Difficulty sequencing, Requires verbal cues General Comments: PT notes the pt does follow one-step command to cross his arms with good initiation and quick processing at end of session, otherwise requiring tactile cues for most motor commands        Exercises      General Comments General comments (skin integrity, edema, etc.): VSS on RA, pt does appear to attempt to vomit but then repeatedly swallows what little contents may have reached his mouth despite encouragement to spit into trash can.      Pertinent Vitals/Pain Pain Assessment Pain Assessment: Faces Faces Pain Scale: Hurts little more Pain Location: intermittent grimacing Pain Descriptors / Indicators: Grimacing Pain Intervention(s): Monitored during  session    Home Living                          Prior Function            PT Goals (current goals can now be found in the care plan section) Acute Rehab PT Goals Patient Stated Goal: unable to state Progress towards PT goals: Progressing toward goals    Frequency    Min 2X/week      PT Plan Current plan remains appropriate    Co-evaluation PT/OT/SLP Co-Evaluation/Treatment: Yes Reason for Co-Treatment: Complexity of the patient's impairments (multi-system involvement);Necessary to address cognition/behavior during functional activity;For patient/therapist safety;To address functional/ADL transfers PT goals addressed during session: Mobility/safety with mobility;Balance;Proper use of DME;Strengthening/ROM OT goals addressed during session: ADL's and self-care      AM-PAC PT "6 Clicks" Mobility   Outcome Measure  Help needed turning from your back to your side while in a flat bed without using bedrails?: A Lot Help needed moving from lying on your back to sitting on the side of a flat bed without using bedrails?: A Lot Help needed moving to and from a bed to a chair (including a wheelchair)?: A Lot Help needed standing up from a chair using your arms (e.g., wheelchair or bedside chair)?: A Lot Help needed to walk in hospital room?: Total Help needed climbing 3-5 steps with a railing? : Total 6 Click Score: 10    End of Session Equipment Utilized During Treatment: Gait belt Activity Tolerance: Patient tolerated treatment well Patient left: in bed;with call bell/phone within reach;with bed alarm set;with family/visitor present Nurse Communication: Mobility status PT  Visit Diagnosis: Unsteadiness on feet (R26.81);Muscle weakness (generalized) (M62.81)     Time: 1730-1753 PT Time Calculation (min) (ACUTE ONLY): 23 min  Charges:  $Gait Training: 8-22 mins $Therapeutic Activity: 8-22 mins                     Arlyss Gandy, PT, DPT Acute  Rehabilitation Office [phone]    Arlyss Gandy 10/03/2022, 6:02 PM

## 2022-10-03 NOTE — Assessment & Plan Note (Signed)
-   Continue home methadone

## 2022-10-03 NOTE — Assessment & Plan Note (Signed)
BMI 32 

## 2022-10-03 NOTE — Progress Notes (Signed)
Physical Therapy Treatment Patient Details Name: Jeremy Medina. MRN: [id number] DOB: [birth year] Today's Date: 10/03/2022   History of Present Illness Octave Montrose. is a 72 y.o. male who presents complaining of altered mental status and progressive decline.  MRI performed at [facility], concern for hydrocephalus and neurosurgeon recommended pt present to ED. Work-up pending. PMHx: type 2 diabetes, PTSD, chronic pain on methadone per son, hypertension, hyperlipidemia    PT Comments    Patient seen for co-treat to address functional balance in sitting and standing. Total Assist required for supine<>sit with cues for sequencing and minimal initiation from pt. Pt unable to maintain midline sitting without mod-max assist and continuously drifting Rt while sitting EOB. Pt was able to perform Lt forearm prop for several seconds but quickly returned to upright and shift back to Rt lean. Sit<>stand completed with recliner for support. Pt holding grab bars on back of chair and Max+2 assist provided for sit<>stand. Pt has strong posterior lean and multimodal cues required to complete anterior weight shift. Pt improved ability to weight shift trunk anteriorly with bil forearm prop on back of chair and anterior reaching for personal stuffed monkey. Pt required manual cues at hip for flexion to initiate sit and total assist to return to supine. Will continue to progress as able. Plan for LP this afternoon with additional therapist planning to re-assess pt following procedure.     Recommendations for follow up therapy are one component of a multi-disciplinary discharge planning process, led by the attending physician.  Recommendations may be updated based on patient status, additional functional criteria and insurance authorization.  Follow Up Recommendations  Can patient physically be transported by private vehicle: No    Assistance Recommended at Discharge Frequent or constant Supervision/Assistance   Patient can return home with the following Two people to help with walking and/or transfers;A lot of help with bathing/dressing/bathroom;Assistance with cooking/housework;Help with stairs or ramp for entrance;Assist for transportation   Equipment Recommendations  Other (comment) (TBA)    Recommendations for Other Services       Precautions / Restrictions Precautions Precautions: Fall Restrictions Weight Bearing Restrictions: No     Mobility  Bed Mobility Overal bed mobility: Needs Assistance Bed Mobility: Supine to Sit, Sit to Supine     Supine to sit: Total assist, +2 for safety/equipment, +2 for physical assistance Sit to supine: Total assist, +2 for physical assistance, +2 for safety/equipment   General bed mobility comments: Pt initiated Lt UE reach to bed rail and Mod assist to bring Rt hand to Lt. Max to bring bil LE's off EOB and Max+2 for press up of trunk and to obtain full sitting balance EOB.    Transfers Overall transfer level: Needs assistance Equipment used: 2 person hand held assist Transfers: Sit to/from Stand Sit to Stand: Max assist, +2 physical assistance, +2 safety/equipment           General transfer comment: recliner placed in front of pt to encourage forward posture, pt able to use BUE to assist in sit<>stand. Continues to have posterior bias and Rt lean. tactile cue at chest for anterior trunk lean to back rest of chair. Pt required mod-max facilitation fro anterior weight shift. Pt had improved anterior trunk lean with bil forearms propped on back of chair and stuffed monkey as visual cues to reach forward and grab. manual facilitation for hip flexion at bil hips provided to initiate return to sitting.    Ambulation/Gait  Stairs             Wheelchair Mobility    Modified Rankin (Stroke Patients Only)       Balance Overall balance assessment: Needs assistance Sitting-balance support: Feet supported,  Bilateral upper extremity supported Sitting balance-Leahy Scale: Poor Sitting balance - Comments: reliant on UE support Postural control: Right lateral lean Standing balance support: Bilateral upper extremity supported Standing balance-Leahy Scale: Poor                              Cognition Arousal/Alertness: Awake/alert Behavior During Therapy: Flat affect Overall Cognitive Status: Difficult to assess                                 General Comments: Difficult to determing impaired cognition vs. communication impairment. Requires maximal multimodal cues to follow 10% of commands. pt echoed therapist 2x, and had 1 verbal response to pain.        Exercises      General Comments General comments (skin integrity, edema, etc.): VSS, blank gaze throughout, minimal verbalizations      Pertinent Vitals/Pain Pain Assessment Pain Assessment: Faces Faces Pain Scale: Hurts little more Pain Location: peri area with hygiene Pain Descriptors / Indicators: Discomfort, Grimacing Pain Intervention(s): Limited activity within patient's tolerance, Monitored during session    Home Living                          Prior Function            PT Goals (current goals can now be found in the care plan section) Acute Rehab PT Goals PT Goal Formulation: Patient unable to participate in goal setting Time For Goal Achievement: 10/13/22 Potential to Achieve Goals: Fair Progress towards PT goals: Progressing toward goals    Frequency    Min 2X/week      PT Plan Current plan remains appropriate    Co-evaluation PT/OT/SLP Co-Evaluation/Treatment: Yes Reason for Co-Treatment: Complexity of the patient's impairments (multi-system involvement);For patient/therapist safety PT goals addressed during session: Mobility/safety with mobility;Balance OT goals addressed during session: ADL's and self-care      AM-PAC PT "6 Clicks" Mobility   Outcome Measure   Help needed turning from your back to your side while in a flat bed without using bedrails?: Total Help needed moving from lying on your back to sitting on the side of a flat bed without using bedrails?: Total Help needed moving to and from a bed to a chair (including a wheelchair)?: Total Help needed standing up from a chair using your arms (e.g., wheelchair or bedside chair)?: Total Help needed to walk in hospital room?: Total Help needed climbing 3-5 steps with a railing? : Total 6 Click Score: 6    End of Session Equipment Utilized During Treatment: Gait belt Activity Tolerance: Patient tolerated treatment well Patient left: in bed;with call bell/phone within reach;with bed alarm set Nurse Communication: Mobility status;Need for lift equipment PT Visit Diagnosis: Unsteadiness on feet (R26.81);Muscle weakness (generalized) (M62.81)     Time: 4098-1191 PT Time Calculation (min) (ACUTE ONLY): 27 min  Charges:  $Therapeutic Activity: 8-22 mins                     Wynn Maudlin, DPT Acute Rehabilitation Services Office [phone]  10/03/22 12:06 PM

## 2022-10-03 NOTE — Assessment & Plan Note (Addendum)
Progressive since January.    Patient remained encephalopathic, mostly nonverbal, during first several days in hospital.   Neuraxial imaging with CT head and MRV showed no acute process.   No improvement with IV thiamine.    Neurology consulted, LP obtained that showed elevated protein and WBC.  No substantial improvement with LP, Neurology suspected fungal meningitis.   NPH was ruled out with elevated LP pressure.  (Previous mention of pseudotumor cerebrii was typo)  Amphotericin B started and some initial clinical improvement, somewhat stalled at this point.  Cytology of CSF -- no malignant cells Crypto antigen -- negative Meningitis PCR panel -- negative Fungitell -- pending Bastomyces Antigen serum -- pending Coccidiodes Abs -- pending Quant gold -- negative MT-RIF NAA with culture TB testing from CSF -- pending Karius test -- pending Blasto IgG CSF -- pending Histo IgG CSF -- pending Blasto antigen CSF -- pending VDRL -- negative  - Continue amphotericin - Consult ID, appreciate involvement - Neuro suspect infectious meningitis, have signed off - Possible LP at end of week - Continue PT OT

## 2022-10-03 NOTE — Assessment & Plan Note (Addendum)
Glucoses elevated - Continue glargine, increase dose - Continue sliding scale corrections - Hold home 70/30 - Hold home Crestor

## 2022-10-03 NOTE — Assessment & Plan Note (Signed)
Not on medication at home

## 2022-10-03 NOTE — Progress Notes (Signed)
  Progress Note   Patient: Jeremy Medina. GNF:[id number] DOB: 19-Oct-1950 DOA: 09/28/2022     1 DOS: the patient was seen and examined on 10/03/2022 at 11:30 AM      Brief hospital course: Jeremy Medina. is a 72 y.o. M with hx HTN, DM, chronic pain on methadone, PTSD/depression/anxiety on daily benzodiazepine who presented with concern for NPH.  Evidently recently admitted at [facility] for subacute encephalopathy (beginning ~Jan, 3 months PTA).  Work up there concerning for NPH, discharged to home with Neurology and Neurosurgery follow ups.  On day of admission, was seen in Neurosurgery clinic and sent to the ER for LP.  Incidentally found in the ER to have AKI Cr 2.3.    4/12: Admitted 4/14: Cr normalized, persistent encephalopathy noted 4/15: Neurology consulted 4/17: LP obtained     Assessment and Plan: * Acute kidney injury Creatinine 2.3 on admission, prerenal.  Improved with fluids down to baseline 0.9  Normal pressure hydrocephalus syndrome See above  Progressive probably subacute encephalopathy - LP today - Consult neurology - PT eval -Continue IV thiamine - Avoid psychoactive medications, Minimize home Ativan   Obesity (BMI 30-39.9) BMI 32  Transaminitis Mild, improving - Follow-up with PCP  Folate deficiency B12 deficiency ruled out - Continue folate supplement  Hyperlipidemia - Hold home Crestor  Diabetes mellitus type 2, controlled, without complications Glucoses controlled - Continue glargine - Continue sliding scale corrections - Hold home 7030 - Hold home Crestor  Benign essential hypertension Blood pressure 140s - Hold lisinopril, amlodipine due to AKI  Anxiety - Continue home Ativan, minimize dosing  Depression Not on medication at home  Chronic pain syndrome - Continue home methadone          Subjective: Patient seems to be nonverbal, he inconsistently follows commands.  Nursing of no concerns.     Physical  Exam: BP (!) 140/110 (BP Location: Right Arm)   Pulse 99   Temp 97.8 F (36.6 C)   Resp 18   SpO2 96%   Elderly adult male, sitting up in bed, appears weak and tired, chronically ill, has a stuffed animal in his arm, appears somewhat rigid and does not follow commands RRR, no murmurs, no peripheral edema Respiratory rate normal, lungs clear without rales or wheezes Abdomen soft without tenderness palpation or guarding, no ascites or distention Makes eye contact, makes no verbalizations, does not consistently follow commands to "close eyes", squeeze my hand"    Data Reviewed: Comprehensive metabolic panel yesterday normal White blood cell count down to 12 yesterday TSH, RPR, HIV negative Renal ultrasound unremarkable, limited exam MR venogram of the head normal admission  Family Communication: called to Son, no answer    Disposition: Status is: Inpatient The patietn remains encephalopathic        Author: Alberteen Sam, MD 10/03/2022 4:40 PM  For on call review [email or web address].

## 2022-10-03 NOTE — Assessment & Plan Note (Signed)
-   Continue home Ativan, minimize dosing

## 2022-10-03 NOTE — Assessment & Plan Note (Signed)
Creatinine 2.3 on admission.  US renal unremarkable.  Improved with fluids down to baseline 0.9

## 2022-10-03 NOTE — Progress Notes (Signed)
Occupational Therapy Treatment Patient Details Name: Jeremy Medina. MRN: [id number] DOB: 08-27-1950 Today's Date: 10/03/2022   History of present illness Kalei Mckillop. is a 72 y.o. male who presents complaining of altered mental status and progressive decline.  MRI performed at [facility], concern for hydrocephalus and neurosurgeon recommended pt present to ED. Work-up pending. PMHx: type 2 diabetes, PTSD, chronic pain on methadone per son, hypertension, hyperlipidemia   OT comments  Pt is making limited progress towards their acute OT goals. Pt seen with PT to safely target activity tolerance and functional mobility. Overall he needed total A +2 for bed mobility and up to max A for sitting balance with consistent LOBs to the R (potentially painful to sit at midline due to hemorrhoid?). The recliner handles were utilized for sit<>stands this date to encourage forward posture in standing, and pt benefited from reaching activity to further improve posterior bias. Ultimately he continues to need max A +2 for standing and fatigues quickly. OT to continue to follow acutely to facilitate progress towards established goals and skilled inpatient follow up therapy, <3 hours/day.    Recommendations for follow up therapy are one component of a multi-disciplinary discharge planning process, led by the attending physician.  Recommendations may be updated based on patient status, additional functional criteria and insurance authorization.    Assistance Recommended at Discharge Frequent or constant Supervision/Assistance  Patient can return home with the following  A lot of help with walking and/or transfers;Two people to help with walking and/or transfers;A lot of help with bathing/dressing/bathroom;Two people to help with bathing/dressing/bathroom;Direct supervision/assist for medications management;Direct supervision/assist for financial management;Assist for transportation;Help with stairs or ramp for  entrance;Assistance with feeding;Assistance with cooking/housework   Equipment Recommendations  None recommended by OT       Precautions / Restrictions Precautions Precautions: Fall Restrictions Weight Bearing Restrictions: No       Mobility Bed Mobility Overal bed mobility: Needs Assistance Bed Mobility: Supine to Sit, Sit to Supine     Supine to sit: Total assist, +2 for safety/equipment, +2 for physical assistance Sit to supine: Total assist, +2 for physical assistance, +2 for safety/equipment   General bed mobility comments: multimodal cues given but ultimately needs total A +2 to initate and sequence task    Transfers Overall transfer level: Needs assistance Equipment used: 2 person hand held assist Transfers: Sit to/from Stand Sit to Stand: Max assist, +2 physical assistance, +2 safety/equipment           General transfer comment: recliner placed in front of pt to encourage forward posture, pt able to use BUE to assist in sit<>stand. Continues to have posteror bias, benefits from reaching task in standing to come forward     Balance Overall balance assessment: Needs assistance Sitting-balance support: Feet supported, Bilateral upper extremity supported Sitting balance-Leahy Scale: Poor Sitting balance - Comments: reliant on UE support Postural control: Right lateral lean Standing balance support: Bilateral upper extremity supported Standing balance-Leahy Scale: Poor                             ADL either performed or assessed with clinical judgement   ADL Overall ADL's : Needs assistance/impaired Eating/Feeding: Total assistance Eating/Feeding Details (indicate cue type and reason): attempted grooming and oral hygeine. Pt not assisting in task despite maximal multimodal cues  Toileting- Clothing Manipulation and Hygiene: Total assistance Toileting - Clothing Manipulation Details (indicate cue type and reason):  total A for rear peri care in standing       General ADL Comments: continues to require total A for all aspects of care    Extremity/Trunk Assessment Upper Extremity Assessment Upper Extremity Assessment: Difficult to assess due to impaired cognition;RUE deficits/detail;LUE deficits/detail RUE Deficits / Details: Seemingly WFL for holding therapist hand, pt's stuffed animal, grasping adn pulling up wtih recliner handle and elbow propping on teh EOB LUE Deficits / Details: Seemingly WFL but difficult to assess. Overall grasping objects and able to hold recliner handle to assist in pulling himself up to standing   Lower Extremity Assessment Lower Extremity Assessment: Defer to PT evaluation        Vision   Vision Assessment?: No apparent visual deficits Additional Comments: seemingly WFL - unable to formally assess   Perception Perception Perception: Not tested   Praxis Praxis Praxis: Not tested    Cognition Arousal/Alertness: Awake/alert Behavior During Therapy: Flat affect Overall Cognitive Status: Difficult to assess                                 General Comments: Difficult to determing impaired cognition vs. communication impairment. Requires maximal multimodal cues to follow 10% of commands. pt echoed therapist 2x, and had 1 verbal response to pain.              General Comments VSS, blank gaze throughout, minimal verbalizations    Pertinent Vitals/ Pain       Pain Assessment Pain Assessment: Faces Faces Pain Scale: Hurts little more Pain Location: peri area with hygiene Pain Descriptors / Indicators: Discomfort, Grimacing Pain Intervention(s): Limited activity within patient's tolerance, Monitored during session   Frequency  Min 1X/week        Progress Toward Goals  OT Goals(current goals can now be found in the care plan section)  Progress towards OT goals: Progressing toward goals  Acute Rehab OT Goals Patient Stated Goal: unable  to state OT Goal Formulation: With patient Time For Goal Achievement: 09/29/22 Potential to Achieve Goals: Good ADL Goals Pt Will Perform Grooming: with mod assist;sitting Pt Will Perform Upper Body Dressing: with mod assist;sitting Pt Will Transfer to Toilet: with mod assist;bedside commode;stand pivot transfer Additional ADL Goal #1: Pt will follow simple 1 step commands 50% of the time to complete ADL task  Plan Discharge plan remains appropriate    Co-evaluation    PT/OT/SLP Co-Evaluation/Treatment: Yes Reason for Co-Treatment: Complexity of the patient's impairments (multi-system involvement);For patient/therapist safety   OT goals addressed during session: ADL's and self-care      AM-PAC OT "6 Clicks" Daily Activity     Outcome Measure   Help from another person eating meals?: Total Help from another person taking care of personal grooming?: Total Help from another person toileting, which includes using toliet, bedpan, or urinal?: Total Help from another person bathing (including washing, rinsing, drying)?: Total Help from another person to put on and taking off regular upper body clothing?: Total Help from another person to put on and taking off regular lower body clothing?: Total 6 Click Score: 6    End of Session Equipment Utilized During Treatment: Gait belt  OT Visit Diagnosis: Unsteadiness on feet (R26.81);Other abnormalities of gait and mobility (R26.89);Muscle weakness (generalized) (M62.81);Pain   Activity Tolerance Patient tolerated treatment well   Patient Left in bed;with  call bell/phone within reach;with bed alarm set   Nurse Communication Mobility status        Time: 1610-9604 OT Time Calculation (min): 26 min  Charges: OT General Charges $OT Visit: 1 Visit OT Treatments $Self Care/Home Management : 8-22 mins  Derenda Mis, OTR/L Acute Rehabilitation Services Office [phone] Secure Chat Communication Preferred   Donia Pounds 10/03/2022, 11:54 AM

## 2022-10-03 NOTE — Assessment & Plan Note (Signed)
See above

## 2022-10-04 DIAGNOSIS — F419 Anxiety disorder, unspecified: Secondary | ICD-10-CM | POA: Diagnosis not present

## 2022-10-04 DIAGNOSIS — G9340 Encephalopathy, unspecified: Secondary | ICD-10-CM | POA: Diagnosis not present

## 2022-10-04 DIAGNOSIS — G031 Chronic meningitis: Secondary | ICD-10-CM

## 2022-10-04 DIAGNOSIS — G030 Nonpyogenic meningitis: Secondary | ICD-10-CM | POA: Diagnosis not present

## 2022-10-04 DIAGNOSIS — G02 Meningitis in other infectious and parasitic diseases classified elsewhere: Secondary | ICD-10-CM | POA: Diagnosis not present

## 2022-10-04 DIAGNOSIS — E669 Obesity, unspecified: Secondary | ICD-10-CM

## 2022-10-04 DIAGNOSIS — G894 Chronic pain syndrome: Secondary | ICD-10-CM | POA: Diagnosis not present

## 2022-10-04 DIAGNOSIS — N179 Acute kidney failure, unspecified: Secondary | ICD-10-CM | POA: Diagnosis not present

## 2022-10-04 DIAGNOSIS — R7401 Elevation of levels of liver transaminase levels: Secondary | ICD-10-CM

## 2022-10-04 DIAGNOSIS — D72829 Elevated white blood cell count, unspecified: Secondary | ICD-10-CM

## 2022-10-04 DIAGNOSIS — G911 Obstructive hydrocephalus: Secondary | ICD-10-CM

## 2022-10-04 LAB — CBC
HCT: 42.7 % (ref 39.0–52.0)
Hemoglobin: 14.3 g/dL (ref 13.0–17.0)
MCH: 28.1 pg (ref 26.0–34.0)
MCHC: 33.5 g/dL (ref 30.0–36.0)
MCV: 84.1 fL (ref 80.0–100.0)
Platelets: 215 10*3/uL (ref 150–400)
RBC: 5.08 MIL/uL (ref 4.22–5.81)
RDW: 14.6 % (ref 11.5–15.5)
WBC: 14.5 10*3/uL — ABNORMAL HIGH (ref 4.0–10.5)
nRBC: 0 % (ref 0.0–0.2)

## 2022-10-04 LAB — GLUCOSE, CAPILLARY
Glucose-Capillary: 210 mg/dL — ABNORMAL HIGH (ref 70–99)
Glucose-Capillary: 226 mg/dL — ABNORMAL HIGH (ref 70–99)
Glucose-Capillary: 109 mg/dL — ABNORMAL HIGH (ref 70–99)
Glucose-Capillary: 198 mg/dL — ABNORMAL HIGH (ref 70–99)
Glucose-Capillary: 276 mg/dL — ABNORMAL HIGH (ref 70–99)

## 2022-10-04 LAB — MENINGITIS/ENCEPHALITIS PANEL (CSF)

## 2022-10-04 LAB — COMPREHENSIVE METABOLIC PANEL
ALT: 49 U/L — ABNORMAL HIGH (ref 0–44)
AST: 32 U/L (ref 15–41)
Albumin: 3.1 g/dL — ABNORMAL LOW (ref 3.5–5.0)
Alkaline Phosphatase: 65 U/L (ref 38–126)
Anion gap: 12 (ref 5–15)
BUN: 25 mg/dL — ABNORMAL HIGH (ref 8–23)
CO2: 27 mmol/L (ref 22–32)
Calcium: 9.5 mg/dL (ref 8.9–10.3)
Chloride: 101 mmol/L (ref 98–111)
Creatinine, Ser: 0.97 mg/dL (ref 0.61–1.24)
GFR, Estimated: >60 mL/min (ref >60)
Glucose, Bld: 140 mg/dL — ABNORMAL HIGH (ref 70–99)
Potassium: 3.2 mmol/L — ABNORMAL LOW (ref 3.5–5.1)
Sodium: 140 mmol/L (ref 135–145)
Total Bilirubin: 0.7 mg/dL (ref 0.3–1.2)
Total Protein: 6.9 g/dL (ref 6.5–8.1)

## 2022-10-04 LAB — MAGNESIUM: Magnesium: 1.9 mg/dL (ref 1.7–2.4)

## 2022-10-04 LAB — CSF CULTURE W GRAM STAIN

## 2022-10-04 MED ORDER — DEXTROSE 5 % IV SOLN
500.0000 mg | INTRAVENOUS | Status: DC
  Administered 2022-10-04 – 2022-10-12 (×9): 500 mg via INTRAVENOUS
  Filled 2022-10-04: qty 125
  Filled 2022-10-04: qty 112.5
  Filled 2022-10-04: qty 125
  Filled 2022-10-04: qty 87.5
  Filled 2022-10-04 (×6): qty 125

## 2022-10-04 MED ORDER — DIPHENHYDRAMINE HCL 50 MG/ML IJ SOLN: 25.0000 mg | Freq: Every day | INTRAMUSCULAR | Status: DC | PRN

## 2022-10-04 MED ORDER — DIPHENHYDRAMINE HCL 25 MG PO CAPS: 25.0000 mg | ORAL_CAPSULE | Freq: Every day | ORAL | Status: DC | PRN

## 2022-10-04 MED ORDER — SODIUM CHLORIDE 0.9 % IV BOLUS FOR AMBISOME
500.0000 mL | INTRAVENOUS | Status: DC
  Administered 2022-10-04 – 2022-10-12 (×9): 500 mL via INTRAVENOUS

## 2022-10-04 MED ORDER — DEXTROSE 5% FOR FLUSHING BEFORE AND AFTER AMBISOME
10.0000 mL | INTRAVENOUS | Status: DC
  Administered 2022-10-04 – 2022-10-12 (×8): 10 mL via INTRAVENOUS
  Filled 2022-10-04 (×10): qty 250

## 2022-10-04 MED ORDER — MEPERIDINE HCL 25 MG/ML IJ SOLN: 25.0000 mg | INTRAMUSCULAR | Status: DC | PRN

## 2022-10-04 MED ORDER — DEXTROSE 5% FOR FLUSHING BEFORE AND AFTER AMBISOME
10.0000 mL | INTRAVENOUS | Status: DC
  Administered 2022-10-04 – 2022-10-11 (×8): 10 mL via INTRAVENOUS
  Filled 2022-10-04 (×10): qty 250

## 2022-10-04 MED ORDER — ACETAMINOPHEN 325 MG PO TABS: 650.0000 mg | ORAL_TABLET | Freq: Every day | ORAL | Status: DC | PRN

## 2022-10-04 MED ORDER — MAGNESIUM SULFATE IN D5W 1-5 GM/100ML-% IV SOLN
1.0000 g | Freq: Once | INTRAVENOUS | Status: AC
  Administered 2022-10-04: 1 g via INTRAVENOUS
  Filled 2022-10-04: qty 100

## 2022-10-04 MED ORDER — SODIUM CHLORIDE 0.9 % IV BOLUS FOR AMBISOME
500.0000 mL | INTRAVENOUS | Status: DC
  Administered 2022-10-04 – 2022-10-11 (×8): 500 mL via INTRAVENOUS

## 2022-10-04 MED ORDER — POTASSIUM CHLORIDE CRYS ER 20 MEQ PO TBCR
40.0000 meq | EXTENDED_RELEASE_TABLET | Freq: Once | ORAL | Status: AC
  Administered 2022-10-04: 40 meq via ORAL
  Filled 2022-10-04: qty 2

## 2022-10-04 MED ORDER — AMLODIPINE BESYLATE 10 MG PO TABS
10.0000 mg | ORAL_TABLET | Freq: Every day | ORAL | Status: DC
  Administered 2022-10-04 – 2022-11-02 (×27): 10 mg via ORAL
  Filled 2022-10-04 (×28): qty 1

## 2022-10-04 NOTE — Inpatient Diabetes Management (Signed)
Inpatient Diabetes Program Recommendations  AACE/ADA: New Consensus Statement on Inpatient Glycemic Control   Target Ranges:  Prepandial:   less than 140 mg/dL      Peak postprandial:   less than 180 mg/dL (1-2 hours)      Critically ill patients:  140 - 180 mg/dL    Latest Reference Range & Units 10/03/22 08:27 10/03/22 11:34 10/03/22 16:19 10/03/22 22:22 10/04/22 07:34  Glucose-Capillary 70 - 99 mg/dL 960 (H) 454 (H) 098 (H) 188 (H) 210 (H)   Review of Glycemic Control  Diabetes history: DM2 Outpatient Diabetes medications: 70/30 0-20 units TID with meals Current orders for Inpatient glycemic control: Semglee 10 units daily, Novolog 0-9 units TID with meals   Inpatient Diabetes Program Recommendations:     Insulin: Please consider increasing Semglee to 13 units daily, adding Novolog 0-5 units QHS, and ordering Novolog 3 units TID with meals for meal coverage if patient eats at least 50% of meals.   Thanks, Orlando Penner, RN, MSN, CDCES Diabetes Coordinator Inpatient Diabetes Program [phone] (Team Pager from 8am to 5pm)

## 2022-10-04 NOTE — Progress Notes (Signed)
Subjective: Cognition is stable today, relative to yesterday, and significantly improved since Monday, with patient able to answer some simple questions and follow some simple commands.   Objective: Current vital signs: BP (!) 158/96 (BP Location: Left Arm)   Pulse 98   Temp 98.3 F (36.8 C) (Oral)   Resp 18   SpO2 100%  Vital signs in last 24 hours: Temp:  [97.5 F (36.4 C)-98.3 F (36.8 C)] 98.3 F (36.8 C) (04/18 0735) Pulse Rate:  [92-99] 98 (04/18 0735) Resp:  [16-18] 18 (04/18 0735) BP: (140-158)/(87-110) 158/96 (04/18 0735) SpO2:  [96 %-100 %] 100 % (04/18 0735)  Intake/Output from previous day: 04/17 0701 - 04/18 0700 In: 240 [P.O.:240] Out: -  Intake/Output this shift: Total I/O In: 150 [IV Piggyback:150] Out: -  Nutritional status:  Diet Order             Diet Carb Modified Fluid consistency: Thin; Room service appropriate? Yes  Diet effective now                   Physical Exam  HEENT-  [place]/AT.  Lungs- Respirations unlabored and regular on room air.  Extremities- No edema    Neurological Examination Mental Status: Awake with eyes open. Able to use short phrases and can answer some questions with 1-2 word answers. Can follow some simple commands Cranial Nerves: II: PERRL   III,IV, VI: No ptosis. Eyes are conjugate. Will intermittently glance to the left and right spontaneously. No nystagmus.  VII: Face is symmetric at rest.  VIII: Makes intermittent eye contact with examiner IX,X: Gag reflex deferred.  XI: Head is midline XII: Does not protrude tongue to command.   Motor: Moves BUE spontaneously and will also follow simple motor commands [place] to BUE. Brisk withdrawal of BLE to noxious plantar stimulation without asymmetry.   Sensory: Reacts to touch x 4.  Deep Tendon Reflexes: 2+ bilateral patellar reflexes, unable to elicit brachoradialis and biceps Plantars: Equivocal bilaterally Cerebellar/Gait: Unable to assess  Lab Results: Results  for orders placed or performed during the hospital encounter of 09/28/22 (from the past 48 hour(s))  Glucose, capillary     Status: Abnormal   Collection Time: 10/02/22 11:42 AM  Result Value Ref Range   Glucose-Capillary 275 (H) 70 - 99 mg/dL    Comment: Glucose reference range applies only to samples taken after fasting for at least 8 hours.   Comment 1 Notify RN    Comment 2 Document in Chart   Glucose, capillary     Status: Abnormal   Collection Time: 10/02/22  4:51 PM  Result Value Ref Range   Glucose-Capillary 167 (H) 70 - 99 mg/dL    Comment: Glucose reference range applies only to samples taken after fasting for at least 8 hours.   Comment 1 Notify RN    Comment 2 Document in Chart   Glucose, capillary     Status: Abnormal   Collection Time: 10/02/22  9:34 PM  Result Value Ref Range   Glucose-Capillary 168 (H) 70 - 99 mg/dL    Comment: Glucose reference range applies only to samples taken after fasting for at least 8 hours.  Comprehensive metabolic panel     Status: Abnormal   Collection Time: 10/03/22  4:27 AM  Result Value Ref Range   Sodium 139 135 - 145 mmol/L   Potassium 3.7 3.5 - 5.1 mmol/L   Chloride 103 98 - 111 mmol/L   CO2 23 22 - 32 mmol/L  Glucose, Bld 166 (H) 70 - 99 mg/dL    Comment: Glucose reference range applies only to samples taken after fasting for at least 8 hours.   BUN 22 8 - 23 mg/dL   Creatinine, Ser 4.09 0.61 - 1.24 mg/dL   Calcium 9.6 8.9 - 81.1 mg/dL   Total Protein 6.5 6.5 - 8.1 g/dL   Albumin 3.1 (L) 3.5 - 5.0 g/dL   AST 30 15 - 41 U/L   ALT 45 (H) 0 - 44 U/L   Alkaline Phosphatase 66 38 - 126 U/L   Total Bilirubin 0.6 0.3 - 1.2 mg/dL   GFR, Estimated >91 >47 mL/min    Comment: (NOTE) Calculated using the CKD-EPI Creatinine Equation (2021)    Anion gap 13 5 - 15    Comment: Performed at [facility], [address] [place], [name] [address]  CBC     Status: Abnormal   Collection Time: 10/03/22  4:27 AM  Result Value Ref  Range   WBC 12.2 (H) 4.0 - 10.5 K/uL   RBC 5.19 4.22 - 5.81 MIL/uL   Hemoglobin 14.4 13.0 - 17.0 g/dL   HCT 21.3 08.6 - 57.8 %   MCV 85.4 80.0 - 100.0 fL   MCH 27.7 26.0 - 34.0 pg   MCHC 32.5 30.0 - 36.0 g/dL   RDW 46.9 62.9 - 52.8 %   Platelets 185 150 - 400 K/uL   nRBC 0.0 0.0 - 0.2 %    Comment: Performed at [facility], [address] [place], [name] [address]  Glucose, capillary     Status: Abnormal   Collection Time: 10/03/22  8:27 AM  Result Value Ref Range   Glucose-Capillary 208 (H) 70 - 99 mg/dL    Comment: Glucose reference range applies only to samples taken after fasting for at least 8 hours.   Comment 1 Notify RN    Comment 2 Document in Chart   Glucose, capillary     Status: Abnormal   Collection Time: 10/03/22 11:34 AM  Result Value Ref Range   Glucose-Capillary 343 (H) 70 - 99 mg/dL    Comment: Glucose reference range applies only to samples taken after fasting for at least 8 hours.   Comment 1 Notify RN    Comment 2 Document in Chart   Cryptococcal antigen, CSF     Status: None   Collection Time: 10/03/22  2:11 PM  Result Value Ref Range   Crypto Ag NEGATIVE NEGATIVE   Cryptococcal Ag Titer NOT INDICATED NOT INDICATED    Comment: Performed at [facility], [address], [place], [name] [address]  CSF cell count with differential     Status: Abnormal   Collection Time: 10/03/22  2:11 PM  Result Value Ref Range   Tube # 1    Color, CSF STRAW (A) COLORLESS   Appearance, CSF CLEAR (A) CLEAR   Supernatant COLORLESS    RBC Count, CSF 18 (H) 0 /cu mm   WBC, CSF 60 (HH) 0 - 5 /cu mm    Comment: CRITICAL RESULT CALLED TO, READ BACK BY AND VERIFIED WITH: L NUNNERY RN 10/03/2022 1643 BNUNNERY    Segmented Neutrophils-CSF 0 0 - 6 %   Lymphs, CSF 13 (L) 40 - 80 %   Monocyte-Macrophage-Spinal Fluid 87 (H) 15 - 45 %   Eosinophils, CSF 0 0 - 1 %    Comment: Performed at [facility], [address] [place], [name]  [address]  CSF culture w  Gram Stain     Status: None (Preliminary result)   Collection Time: 10/03/22  2:11 PM   Specimen: PATH Cytology CSF; Cerebrospinal Fluid  Result Value Ref Range   Specimen Description CSF    Special Requests NONE    Gram Stain      WBC PRESENT, PREDOMINANTLY MONONUCLEAR NO ORGANISMS SEEN CYTOSPIN SMEAR    Culture      NO GROWTH < 24 HOURS Performed at [facility], [address] [place], [name] [address]    Report Status PENDING   Protein and glucose, CSF     Status: Abnormal   Collection Time: 10/03/22  2:11 PM  Result Value Ref Range   Glucose, CSF 67 40 - 70 mg/dL   Total  Protein, CSF 098 (H) 15 - 45 mg/dL    Comment: RESULT CONFIRMED BY MANUAL DILUTION Performed at [facility], [address] [place], [name] [address]   Meningitis/Encephalitis Panel (CSF)     Status: None   Collection Time: 10/03/22  2:28 PM  Result Value Ref Range   Cryptococcus neoformans/gattii (CSF) NOT DETECTED NOT DETECTED    Comment: (NOTE) Patients with a suspicion of cryptococcal meningitis should be tested  for cryptococcal antigen (CrAg).      Cytomegalovirus (CSF) NOT DETECTED NOT DETECTED   Enterovirus (CSF) NOT DETECTED NOT DETECTED   Escherichia coli K1 (CSF) NOT DETECTED NOT DETECTED    Comment: (NOTE) Only E. coli strains possessing the K1 capsular antigen will be detected.      Haemophilus influenzae (CSF) NOT DETECTED NOT DETECTED   Herpes simplex virus 1 (CSF) NOT DETECTED NOT DETECTED   Herpes simplex virus 2 (CSF) NOT DETECTED NOT DETECTED   Human herpesvirus 6 (CSF) NOT DETECTED NOT DETECTED   Human parechovirus (CSF) NOT DETECTED NOT DETECTED   Listeria monocytogenes (CSF) NOT DETECTED NOT DETECTED   Neisseria meningitis (CSF) NOT DETECTED NOT DETECTED    Comment: (NOTE) Only encapsulated strains of N. meningitidis will be detected.     Streptococcus agalactiae (CSF) NOT DETECTED NOT DETECTED   Streptococcus pneumoniae (CSF) NOT DETECTED NOT DETECTED    Varicella zoster virus (CSF) NOT DETECTED NOT DETECTED    Comment: Performed at [facility], [address], [place], [name] [address]  Glucose, capillary     Status: Abnormal   Collection Time: 10/03/22  4:19 PM  Result Value Ref Range   Glucose-Capillary 169 (H) 70 - 99 mg/dL    Comment: Glucose reference range applies only to samples taken after fasting for at least 8 hours.   Comment 1 Notify RN    Comment 2 Document in Chart   Glucose, capillary     Status: Abnormal   Collection Time: 10/03/22 10:22 PM  Result Value Ref Range   Glucose-Capillary 188 (H) 70 - 99 mg/dL    Comment: Glucose reference range applies only to samples taken after fasting for at least 8 hours.  CBC     Status: Abnormal   Collection Time: 10/04/22  3:47 AM  Result Value Ref Range   WBC 14.5 (H) 4.0 - 10.5 K/uL   RBC 5.08 4.22 - 5.81 MIL/uL   Hemoglobin 14.3 13.0 - 17.0 g/dL   HCT 62.1 30.8 - 65.7 %   MCV 84.1 80.0 - 100.0 fL   MCH 28.1 26.0 - 34.0 pg   MCHC 33.5 30.0 - 36.0 g/dL   RDW 84.6 96.2 - 95.2 %  Platelets 215 150 - 400 K/uL   nRBC 0.0 0.0 - 0.2 %    Comment: Performed at [facility], [address], [place], [name] [address]  Comprehensive metabolic panel     Status: Abnormal   Collection Time: 10/04/22  3:47 AM  Result Value Ref Range   Sodium 140 135 - 145 mmol/L   Potassium 3.2 (L) 3.5 - 5.1 mmol/L   Chloride 101 98 - 111 mmol/L   CO2 27 22 - 32 mmol/L   Glucose, Bld 140 (H) 70 - 99 mg/dL    Comment: Glucose reference range applies only to samples taken after fasting for at least 8 hours.   BUN 25 (H) 8 - 23 mg/dL   Creatinine, Ser 9.14 0.61 - 1.24 mg/dL   Calcium 9.5 8.9 - 78.2 mg/dL   Total Protein 6.9 6.5 - 8.1 g/dL   Albumin 3.1 (L) 3.5 - 5.0 g/dL   AST 32 15 - 41 U/L   ALT 49 (H) 0 - 44 U/L   Alkaline Phosphatase 65 38 - 126 U/L   Total Bilirubin 0.7 0.3 - 1.2 mg/dL   GFR, Estimated >95 >62 mL/min    Comment: (NOTE) Calculated using the CKD-EPI Creatinine  Equation (2021)    Anion gap 12 5 - 15    Comment: Performed at [facility], [address] [place], [name] [address]  Glucose, capillary     Status: Abnormal   Collection Time: 10/04/22  7:34 AM  Result Value Ref Range   Glucose-Capillary 210 (H) 70 - 99 mg/dL    Comment: Glucose reference range applies only to samples taken after fasting for at least 8 hours.    Recent Results (from the past 240 hour(s))  Culture, blood (Routine X 2) w Reflex to ID Panel     Status: None   Collection Time: 09/28/22 10:47 PM   Specimen: BLOOD LEFT FOREARM  Result Value Ref Range Status   Specimen Description BLOOD LEFT FOREARM  Final   Special Requests   Final    BOTTLES DRAWN AEROBIC AND ANAEROBIC Blood Culture adequate volume   Culture   Final    NO GROWTH 5 DAYS Performed at [facility], [address], [place], [name] [address]    Report Status 10/03/2022 FINAL  Final  Culture, blood (Routine X 2) w Reflex to ID Panel     Status: None   Collection Time: 09/28/22 10:50 PM   Specimen: BLOOD RIGHT HAND  Result Value Ref Range Status   Specimen Description BLOOD RIGHT HAND  Final   Special Requests   Final    BOTTLES DRAWN AEROBIC ONLY Blood Culture results may not be optimal due to an inadequate volume of blood received in culture bottles   Culture   Final    NO GROWTH 5 DAYS Performed at [facility], [address] [place], [name] [address]    Report Status 10/03/2022 FINAL  Final  CSF culture w Gram Stain     Status: None (Preliminary result)   Collection Time: 10/03/22  2:11 PM   Specimen: PATH Cytology CSF; Cerebrospinal Fluid  Result Value Ref Range Status   Specimen Description CSF  Final   Special Requests NONE  Final   Gram Stain   Final    WBC PRESENT, PREDOMINANTLY MONONUCLEAR NO ORGANISMS SEEN CYTOSPIN SMEAR    Culture   Final    NO GROWTH < 24 HOURS Performed at [facility], [address], [place], [name] [address]    Report Status  PENDING  Incomplete    Lipid Panel No results for input(s): "CHOL", "TRIG", "HDL", "CHOLHDL", "VLDL", "LDLCALC" in the last 72 hours.  Studies/Results: DG FL GUIDED THERAPEUTIC LUMBAR PUNCTURE  Result Date: 10/03/2022 CLINICAL DATA:  Altered mental status and functional decline. Concern for possible normal pressure hydrocephalus. Request diagnostic and potentially therapeutic lumbar puncture. EXAM: DIAGNOSTIC AND THERAPEUTIC LUMBAR PUNCTURE UNDER FLUOROSCOPIC GUIDANCE COMPARISON:  CT abdomen and pelvis-08/02/2022 FLUOROSCOPY: 18 seconds (1.1 mGy) PROCEDURE: Informed consent was obtained from the patient's son prior to the procedure, including potential complications of headache, allergy, and pain. With the patient prone, the lower back was prepped with Betadine. 1% Lidocaine was used for local anesthesia. Lumbar puncture was performed at the L2-L3 level using a 20 gauge needle with return of clear, colorless CSF with an opening pressure of 36 cm water. Approximately 15 total ml of CSF were obtained before the flow slowed to a halt. Despite attempts to increased intra-abdominal pressure as well as tilting the table to improve flow, no further fluid could be obtained. Closing pressure of 10 cm water. Fluid was sent to the lab for the requested studies. The patient tolerated the procedure well and there were no apparent complications. IMPRESSION: Technically successful lumbar puncture from L2-L3 without complication. Procedure performed by Brayton El PA-C and supervised by Dr. Katherina Right Electronically Signed   By: Simonne Come M.D.   On: 10/03/2022 14:56    Medications: Scheduled:  folic acid  1 mg Oral Daily   insulin aspart  0-9 Units Subcutaneous TID WC   insulin glargine-yfgn  10 Units Subcutaneous Daily   lipase/protease/amylase  36,000 Units Oral TID AC   LORazepam  0.5 mg Oral QHS   methadone  10 mg Oral TID   potassium chloride  40 mEq Oral Once   sodium chloride flush  3 mL Intravenous  Q12H   Continuous:  thiamine (VITAMIN B1) injection 100 mL/hr at 10/04/22 0900    Assessment: 72 year old male presenting with gradually progressively worsening cognition, gait changes and falls, urinary incontinence x 3 months with initial modified Rankin scale of 0 prior to that.  - Additional history per son, [name] was obtained after admission: Sick in stomach began late jan , then mental fog, headaches, vomiting nightly in feb, then wasn't able to go to bathroom because walk became "shuffling" and "couldn't lift feet" . Then speech declined followed by urinary incontinence. Prior to this, report of pulsatile tinnitus  - Exam reveals abulia, global aphasia and mild motor agitation in response to stimulation. Subtle twitches to right wrist had been noted intermittently but there is no concern for epileptiform focus on routine EEG and suspicion for this is low. Rather, EEG findings of generalized slowing more concerning for a nonspecific encephalopathy. - DDx: - Probable fungal meningitis progressing to communicating hydrocephalus - Initially thought to be an atypical presentation of NPH with white cells in CSF (confirmed as a rare occurrence by literature search), but this is now significantly lower on the DDx.  - MRV did not reveal dural venous sinus thrombus or venous fistula, nor would this explain the remainder of his presentation.   - No evidence of strokes on MRI.  - No recent significant head trauma or infections. - Of note, patient does appear to have a chronic leukocytosis on serial CBCs that is of unclear significance. - Cryptococcal antigen is negative.  - LP successfully completed  in IR. Opening pressure was elevated at 36 cm H2O. 15 cc of clear, straw colored CSF obtained before flow slowed to a halt. Closing pressure 10 cm H2O.  - Initial CSF labs grossly abnormal with 60 WBC predominantly monocytes/macrophages (87%) with the remainder being lymphocytes, and no neutrophils. Protein  markedly elevated at 365 mg/dL. Glucose 67.  - The WBC profile is suggestive of a process that is most likely chronic. DDx includes cryptococcus or other fungal meningitis. Will need ID consult with appropriate labs added to CSF.  - Cryptococcal Ag pending. CSF infectious disease PCR panel has been ordered. CSF culture including fungal culture ordered. VDRL ordered.   - Given no fever or headache and also given that WBC count which was elevated on admission has been rapidly decreasing, in addition to lack of neutrophils in CSF, a bacterial or viral meningitis is felt to be highly unlikely. Will hold off on antibiotics for now but will need ID input to assess for possible fungal meningitis.   - Discussed with ID. They feel that highest on DDx for infection with communicating hydrocephalus secondary to fungal infection would be histoplasmosis, blastomycosis and coccidioides immitis, but this does not preclude other possible infectious processes - He has clinically improved somewhat following drainage of 15 cc of CSF with LP, consistent with communicating hydrocephalus. The patient has been assessed by PT post-LP. Per PT, after LP the patient did seem to do quite a bit better than previous PT sessions as well as the session he had this morning. Posterior lean was less significant, he was able to take some steps (with a lot of help), and he seemed to communicate more frequently. Overall he is still functioning very poorly, but this was definitely better than he has been prior to CSF drainage.    Recommendations: - Continue to avoid deliriogenic medications.  - Await further input from ID   LOS: 2 days   @Electronically  signed: Dr. Caryl Pina 10/04/2022  9:37 AM
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
## 2022-10-04 NOTE — Progress Notes (Signed)
Pharmacy Anti-infective Note  Jeremy Medina. is a 72 y.o. male admitted on 09/28/2022 with AMS/encephalopathy and concern for meningitis. LP done on 4/17 shows 40% CSF/serum glucose ratio, elevated protein with concern for fungal PNA. Crypto neg, additional testing sent for coccidioides, histoplasmosis, blastomyces, TB. Pharmacy has been consulted for empiric liposomal amphotericin B dosing.  The patient was noted to have AKI on admission, now resolving with SCr down to 0.97 (2.2 on admission). K 3.2 today - 40 mEq x 1 ordered for replacement. Add-on Mg is 1.9 - will supplement.  The patient is not fluid restricted so will utilize pre/post boluses of 500 cc of NS that will require D5W flushes prior to and after ampho B administration. Instructions discussed in detail with the RN.   Plan: - Start Liposomal Amphotericin B 500 mg (~5 mg/kg) IV every 24 hours - Pre/post NS fluid boluses of 500 cc + D5W flushes before/after drug administration - Will monitor lytes and renal function closely - daily BMET + Mg - Magnesium 1g IV x 1 dose today - Administration instructions discussed in detail with nursing staff - Will follow-up on additional testing to determine duration of therapy   Height:  (177.8 cm) Weight: 102.1 kg (225 lb) IBW/kg (Calculated) : 73  Temp (24hrs), Avg:98 F (36.7 C), Min:97.5 F (36.4 C), Max:98.3 F (36.8 C)  Recent Labs  Lab 09/30/22 0500 10/01/22 0356 10/02/22 0459 10/03/22 0427 10/04/22 0347  WBC 17.7* 14.0* 11.6* 12.2* 14.5*  CREATININE 1.27* 1.07 0.93 0.93 0.97    Estimated Creatinine Clearance: 84.8 mL/min (by C-G formula based on SCr of 0.97 mg/dL).    Allergies  Allergen Reactions   Bee Venom Anaphylaxis    Antimicrobials this admission: Ampho B >>  Dose adjustments this admission:   Microbiology results: 4/12 BCx >> ngx5d 4/17 CSF cx >> ng<24h  Thank you for allowing pharmacy to be a part of this patient's care.  Georgina Pillion, PharmD, BCPS Infectious Diseases Clinical Pharmacist 10/04/2022 1:04 PM   **Pharmacist phone directory can now be found on [email or web address] ([email or web address]).  Listed under [facility].

## 2022-10-04 NOTE — Progress Notes (Signed)
Progress Note   Patient: Jeremy Medina. QMV:[id number] DOB: 02/15/1951 DOA: 09/28/2022     2 DOS: the patient was seen and examined on 10/04/2022 9:05AM      Brief hospital course: Jeremy Medina. is a 72 y.o. M with hx HTN, DM, chronic pain on methadone, PTSD/depression/anxiety on daily benzodiazepine who presented with concern for NPH.  Evidently recently admitted at [facility] for subacute encephalopathy (beginning ~Jan, 3 months PTA).  Work up there concerning for NPH, discharged to home with Neurology and Neurosurgery follow ups.  On day of admission, was seen in Neurosurgery clinic and sent to the ER for LP.  Incidentally found in the ER to have AKI Cr 2.3.    4/12: Admitted 4/14: Cr normalized, persistent encephalopathy noted 4/15: Neurology consulted 4/17: LP obtained 4/18: ID consulted, amphotericin started     Assessment and Plan: Acute kidney injury Creatinine 2.3 on admission.  US renal unremarkable.  Improved with fluids down to baseline 0.9  Progressive subacute encephalopathy Patient remains encephalopathic, mostly nonverbal, says "yeah" occasionally, but not clearly with intention.  Does not consistently follow commands, but is able to participate in self cares (drink from straw, stand with assistance, reposition in bed).  Neuraxial imaging here includes CT head and MRV.    LP on 4/17 showed elevated opening pressure, 60 WBC in tube four, monocytes and macrophages.  Crypto antigen negative.  Protein elevated.  Meningitis PCR panel negative.  No improvement with IV thiamine.  No substantial improvement with LP.  Neurology feel differential should include fungal meningitis, pseudotumor cerebrii.  No fundic exam obtained yet. - Continue IV thiamine per neuro - Avoid deliriogenic medications - Appreciate Neuro expertise  - Consult ID, appreciate assistance - Amphotericin per ID    Obesity (BMI 30-39.9) BMI 32  Transaminitis Mild, improving - Follow-up  with PCP  Folate deficiency B12 deficiency ruled out - Continue folate supplement  Hyperlipidemia - Hold home Crestor  Diabetes mellitus type 2, controlled, without complications Glucoses elevated - Continue glargine - Continue sliding scale corrections - Hold home 70/30 - Hold home Crestor  Benign essential hypertension BP elevated - Resume amlodipine - Hold Lisinopril   Anxiety - Continue home Ativan, dose minimized  Depression Not on medication at home  Chronic pain syndrome - Continue home methadone  Hypokalemia - Supplement K        Subjective: Patient nonverbal.  No nursing concerns.  No fever, respiratory symptoms, seizures.     Physical Exam: BP (!) 158/96 (BP Location: Left Arm)   Pulse 98   Temp 98.3 F (36.8 C) (Oral)   Resp 18   Ht  (1.778 m)   Wt 102.1 kg   SpO2 100%   BMI 32.28 kg/m   Elderly male, lying in bed, appears disheveled Tachycardic, regular no murmurs, no LE edema, no JVD RR seems normal, I do not appreciate rales or wheezing but he does not breathe to command and takes relatively limited breaths Abdomen with involuntary gurding, no obvious tenderness or grimace. No distension Makes eye contact but makes no sensible responses to questions or tactile stimuli, moves upper extremites slowly and rigidly, with increased tone, but alble to manipulate cup with straw and drink from it.     Data Reviewed: Discussed with ID and Neurology K down to 3.2 CSF studies as above LFTs no change     Family Communication: None present    Disposition: Status is: Inpatient Still densely aphasic, encephalopathic, will need continued work  up and likely SNF level care at discharge           Author: Alberteen Sam, MD 10/04/2022 12:51 PM  For on call review [email or web address].

## 2022-10-05 DIAGNOSIS — F419 Anxiety disorder, unspecified: Secondary | ICD-10-CM | POA: Diagnosis not present

## 2022-10-05 DIAGNOSIS — G02 Meningitis in other infectious and parasitic diseases classified elsewhere: Secondary | ICD-10-CM | POA: Diagnosis not present

## 2022-10-05 DIAGNOSIS — G030 Nonpyogenic meningitis: Secondary | ICD-10-CM | POA: Diagnosis not present

## 2022-10-05 DIAGNOSIS — N179 Acute kidney failure, unspecified: Secondary | ICD-10-CM | POA: Diagnosis not present

## 2022-10-05 DIAGNOSIS — G9340 Encephalopathy, unspecified: Secondary | ICD-10-CM | POA: Diagnosis not present

## 2022-10-05 LAB — GLUCOSE, CAPILLARY
Glucose-Capillary: 226 mg/dL — ABNORMAL HIGH (ref 70–99)
Glucose-Capillary: 211 mg/dL — ABNORMAL HIGH (ref 70–99)
Glucose-Capillary: 405 mg/dL — ABNORMAL HIGH (ref 70–99)

## 2022-10-05 LAB — MISC LABCORP TEST (SEND OUT): Labcorp test code: 9985

## 2022-10-05 LAB — CULTURE, FUNGUS WITHOUT SMEAR

## 2022-10-05 LAB — MAGNESIUM: Magnesium: 2 mg/dL (ref 1.7–2.4)

## 2022-10-05 LAB — BASIC METABOLIC PANEL
Anion gap: 10 (ref 5–15)
BUN: 21 mg/dL (ref 8–23)
CO2: 27 mmol/L (ref 22–32)
Calcium: 9.7 mg/dL (ref 8.9–10.3)
Chloride: 104 mmol/L (ref 98–111)
Creatinine, Ser: 0.99 mg/dL (ref 0.61–1.24)
GFR, Estimated: >60 mL/min (ref >60)
Glucose, Bld: 220 mg/dL — ABNORMAL HIGH (ref 70–99)
Potassium: 4.2 mmol/L (ref 3.5–5.1)
Sodium: 141 mmol/L (ref 135–145)

## 2022-10-05 LAB — VDRL, CSF: VDRL Quant, CSF: NONREACTIVE

## 2022-10-05 LAB — CYTOLOGY - NON PAP

## 2022-10-05 MED ORDER — INSULIN GLARGINE-YFGN 100 UNIT/ML ~~LOC~~ SOLN
15.0000 [IU] | Freq: Every day | SUBCUTANEOUS | Status: DC
  Administered 2022-10-06: 15 [IU] via SUBCUTANEOUS
  Filled 2022-10-05 (×2): qty 0.15

## 2022-10-05 NOTE — Progress Notes (Addendum)
Subjective: Lying in bed, asleep.   Objective: Current vital signs: BP 131/84 (BP Location: Left Arm)   Pulse 92   Temp 97.9 F (36.6 C) (Oral)   Resp 16   Ht 5\' 10"  (1.778 m)   Wt 102.1 kg   SpO2 97%   BMI 32.28 kg/m  Vital signs in last 24 hours: Temp:  [97.9 F (36.6 C)-98.5 F (36.9 C)] 97.9 F (36.6 C) (04/19 1633) Pulse Rate:  [92-97] 92 (04/19 1633) Resp:  [13-18] 16 (04/19 1633) BP: (131-159)/(84-99) 131/84 (04/19 1633) SpO2:  [92 %-97 %] 97 % (04/19 1633)  Intake/Output from previous day: 04/18 0701 - 04/19 0700 In: 150 [IV Piggyback:150] Out: 1670 [Urine:1670] Intake/Output this shift: Total I/O In: -  Out: 300 [Urine:300] Nutritional status:  Diet Order             Diet Carb Modified Fluid consistency: Thin; Room service appropriate? Yes  Diet effective now                  HEENT-  [place]/AT. Moderatelhy increased neck extensor tone; able to turn neck to left and right passively with mildly increased tone; both unchanged since last exam Lungs- Respirations unlabored and regular on room air.  Extremities- No edema    Neurological Examination Mental Status: Awakens after about 15 seconds of tactile stimulation. He makes eye contact but does not initiate conversation. Abulic. Does not speak in full sentences and also without short phrases, all verbal output being single word responses. Long pauses prior to answering questions with one word responses and requires multiple requests before answering. Does not answer any orientation questions. Can follow some simple commands.  Cranial Nerves: II: PERRL   III,IV, VI: No ptosis. Eyes are conjugate. Will intermittently glance to the left and right spontaneously. No nystagmus.  VII: Face is symmetric at rest and with grimacing.  VIII: Hearing intact to voice.  IX,X: Gag reflex deferred.  XI: No asymmetry XII: Does not protrude tongue to command.   Motor: Moves BUE spontaneously and will also follow simple motor  commands with 4/5 strength symmetrically.  Paratonia to BUE. Brisk withdrawal of BLE to noxious plantar stimulation without asymmetry.   Sensory: Reacts to touch x 4.  Deep Tendon Reflexes: 2+ bilateral patellar reflexes Cerebellar/Gait: Unable to assess   Lab Results: Results for orders placed or performed during the hospital encounter of 09/28/22 (from the past 48 hour(s))  Glucose, capillary     Status: Abnormal   Collection Time: 10/03/22 10:22 PM  Result Value Ref Range   Glucose-Capillary 188 (H) 70 - 99 mg/dL    Comment: Glucose reference range applies only to samples taken after fasting for at least 8 hours.  CBC     Status: Abnormal   Collection Time: 10/04/22  3:47 AM  Result Value Ref Range   WBC 14.5 (H) 4.0 - 10.5 K/uL   RBC 5.08 4.22 - 5.81 MIL/uL   Hemoglobin 14.3 13.0 - 17.0 g/dL   HCT 16.1 09.6 - 04.5 %   MCV 84.1 80.0 - 100.0 fL   MCH 28.1 26.0 - 34.0 pg   MCHC 33.5 30.0 - 36.0 g/dL   RDW 40.9 81.1 - 91.4 %   Platelets 215 150 - 400 K/uL   nRBC 0.0 0.0 - 0.2 %    Comment: Performed at [facility], [address] [place], [name] [address]  Comprehensive metabolic panel     Status: Abnormal   Collection Time: 10/04/22  3:47 AM  Result Value Ref Range   Sodium 140 135 - 145 mmol/L   Potassium 3.2 (L) 3.5 - 5.1 mmol/L   Chloride 101 98 - 111 mmol/L   CO2 27 22 - 32 mmol/L   Glucose, Bld 140 (H) 70 - 99 mg/dL    Comment: Glucose reference range applies only to samples taken after fasting for at least 8 hours.   BUN 25 (H) 8 - 23 mg/dL   Creatinine, Ser 1.61 0.61 - 1.24 mg/dL   Calcium 9.5 8.9 - 09.6 mg/dL   Total Protein 6.9 6.5 - 8.1 g/dL   Albumin 3.1 (L) 3.5 - 5.0 g/dL   AST 32 15 - 41 U/L   ALT 49 (H) 0 - 44 U/L   Alkaline Phosphatase 65 38 - 126 U/L   Total Bilirubin 0.7 0.3 - 1.2 mg/dL   GFR, Estimated >04 >54 mL/min    Comment: (NOTE) Calculated using the CKD-EPI Creatinine Equation (2021)    Anion gap 12 5 - 15    Comment: Performed at  [facility], [address], [place], [name] [address]  Magnesium     Status: None   Collection Time: 10/04/22  3:47 AM  Result Value Ref Range   Magnesium 1.9 1.7 - 2.4 mg/dL    Comment: Performed at [facility], [address] [place], [name] [address]  Glucose, capillary     Status: Abnormal   Collection Time: 10/04/22  7:34 AM  Result Value Ref Range   Glucose-Capillary 210 (H) 70 - 99 mg/dL    Comment: Glucose reference range applies only to samples taken after fasting for at least 8 hours.  Miscellaneous [facility] test (send-out)     Status: None   Collection Time: 10/04/22  8:54 AM  Result Value Ref Range   [facility] test code [id number]     Comment: CORRECTED ON 04/18 AT 1102: [place] [id number]   [facility] test name      HISTOPLASMA ANTIGEN QUANTITATIVE EIA TO QUEST TEST CODE [id number]    Comment: CORRECTED ON 04/18 AT 1102: PREVIOUSLY REPORTED AS [id number]   Source ([facility]) REF CSF     Comment: Performed at [facility], [address] [place], [name] [address]   Misc [facility] result COMMENT     Comment: (NOTE) Performed At: [facility] [address] [place], [name] [id number] Jolene Schimke MD GU:[id number]   Miscellaneous [facility] test (send-out)     Status: None   Collection Time: 10/04/22  8:56 AM  Result Value Ref Range   [facility] test code [id number]     Comment: CORRECTED ON 04/18 AT 1101: PREVIOUSLY REPORTED AS [id number]   [facility] test name      COCCIDIOIDES ANTIBODY IGG IMMUNODIFFUSION TO QUEST TEST CODE [id number] CPT 41660    Comment: CORRECTED ON 04/18 AT 1101: PREVIOUSLY REPORTED AS [id number]   Source ([facility]) REF CSF     Comment: Performed at [facility], [address] [place], [name] [address]   Misc [facility] result COMMENT     Comment: (NOTE) Performed At: [facility] [address] [place], [name] [id number] Jolene Schimke MD RK:[id number]   Glucose, capillary     Status: Abnormal   Collection Time: 10/04/22 11:41  AM  Result Value Ref Range   Glucose-Capillary 226 (H) 70 - 99 mg/dL    Comment: Glucose reference range applies only to samples taken after fasting for at least 8 hours.  Glucose,  capillary     Status: Abnormal   Collection Time: 10/04/22  3:49 PM  Result Value Ref Range   Glucose-Capillary 109 (H) 70 - 99 mg/dL    Comment: Glucose reference range applies only to samples taken after fasting for at least 8 hours.  Glucose, capillary     Status: Abnormal   Collection Time: 10/04/22  5:29 PM  Result Value Ref Range   Glucose-Capillary 198 (H) 70 - 99 mg/dL    Comment: Glucose reference range applies only to samples taken after fasting for at least 8 hours.  Glucose, capillary     Status: Abnormal   Collection Time: 10/04/22  8:04 PM  Result Value Ref Range   Glucose-Capillary 276 (H) 70 - 99 mg/dL    Comment: Glucose reference range applies only to samples taken after fasting for at least 8 hours.  Basic metabolic panel     Status: Abnormal   Collection Time: 10/05/22  4:50 AM  Result Value Ref Range   Sodium 141 135 - 145 mmol/L   Potassium 4.2 3.5 - 5.1 mmol/L   Chloride 104 98 - 111 mmol/L   CO2 27 22 - 32 mmol/L   Glucose, Bld 220 (H) 70 - 99 mg/dL    Comment: Glucose reference range applies only to samples taken after fasting for at least 8 hours.   BUN 21 8 - 23 mg/dL   Creatinine, Ser 1.61 0.61 - 1.24 mg/dL   Calcium 9.7 8.9 - 09.6 mg/dL   GFR, Estimated >04 >54 mL/min    Comment: (NOTE) Calculated using the CKD-EPI Creatinine Equation (2021)    Anion gap 10 5 - 15    Comment: Performed at [facility], [address], [place], [name] [address]  Magnesium     Status: None   Collection Time: 10/05/22  4:50 AM  Result Value Ref Range   Magnesium 2.0 1.7 - 2.4 mg/dL    Comment: Performed at [facility], [address], [place], [name] [address]  Glucose, capillary     Status: Abnormal   Collection Time: 10/05/22  7:24 AM  Result Value Ref Range    Glucose-Capillary 226 (H) 70 - 99 mg/dL    Comment: Glucose reference range applies only to samples taken after fasting for at least 8 hours.  Glucose, capillary     Status: Abnormal   Collection Time: 10/05/22 11:51 AM  Result Value Ref Range   Glucose-Capillary 211 (H) 70 - 99 mg/dL    Comment: Glucose reference range applies only to samples taken after fasting for at least 8 hours.    Recent Results (from the past 240 hour(s))  Culture, blood (Routine X 2) w Reflex to ID Panel     Status: None   Collection Time: 09/28/22 10:47 PM   Specimen: BLOOD LEFT FOREARM  Result Value Ref Range Status   Specimen Description BLOOD LEFT FOREARM  Final   Special Requests   Final    BOTTLES DRAWN AEROBIC AND ANAEROBIC Blood Culture adequate volume   Culture   Final    NO GROWTH 5 DAYS Performed at [facility], [address], [place], [name] [address]    Report Status 10/03/2022 FINAL  Final  Culture, blood (Routine X 2) w Reflex to ID Panel     Status: None   Collection Time: 09/28/22 10:50 PM   Specimen: BLOOD RIGHT HAND  Result Value Ref Range Status   Specimen Description BLOOD RIGHT HAND  Final   Special Requests   Final    BOTTLES DRAWN AEROBIC ONLY Blood Culture results may not be optimal due to an inadequate volume of blood received in culture bottles   Culture   Final    NO GROWTH 5 DAYS Performed at [facility], [address] [place], [name] [address]    Report Status 10/03/2022 FINAL  Final  CSF culture w Gram Stain     Status: None (Preliminary result)   Collection Time: 10/03/22  2:11 PM   Specimen: PATH Cytology CSF; Cerebrospinal Fluid  Result Value Ref Range Status   Specimen Description CSF  Final   Special Requests NONE  Final   Gram Stain   Final    WBC PRESENT, PREDOMINANTLY MONONUCLEAR NO ORGANISMS SEEN CYTOSPIN SMEAR    Culture   Final    NO GROWTH 2 DAYS Performed at [facility], [address], [place], [name] [address]    Report  Status PENDING  Incomplete  Culture, fungus without smear     Status: None (Preliminary result)   Collection Time: 10/03/22  2:11 PM   Specimen: CSF; Other  Result Value Ref Range Status   Specimen Description CSF  Final   Special Requests NONE  Final   Culture   Final    NO FUNGUS ISOLATED AFTER 2 DAYS Performed at [facility], [address], [place], [name] [address]    Report Status PENDING  Incomplete    Lipid Panel No results for input(s): "CHOL", "TRIG", "HDL", "CHOLHDL", "VLDL", "LDLCALC" in the last 72 hours.  Studies/Results: No results found.  Medications: Scheduled:  amLODipine  10 mg Oral Daily   dextrose  10 mL Intravenous Q24H   dextrose  10 mL Intravenous Q24H   folic acid  1 mg Oral Daily   insulin aspart  0-9 Units Subcutaneous TID WC   [START ON 10/06/2022] insulin glargine-yfgn  15 Units Subcutaneous Daily   lipase/protease/amylase  36,000 Units Oral TID AC   LORazepam  0.5 mg Oral QHS   methadone  10 mg Oral TID   sodium chloride  500 mL Intravenous Q24H   sodium chloride  500 mL Intravenous Q24H   sodium chloride flush  3 mL Intravenous Q12H   Continuous:  amphotericin B liposome (AMBISOME) 500 mg in dextrose 5 % 500 mL IVPB 500 mg (10/04/22 1653)   Assessment: 72 year old male presenting with progressively worsening cognition, gait changes with falls, and urinary incontinence x 3 months, on a baseline of normal functioning prior to January. Imaging from prior evaluations at outside facilities revealed ballooning of the ventricles which was also present on repeat scanning this admission. 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 ID is consulting and the patient was started on IV amphotericin yesterday (4/18), with some  improvement now noted on exam Friday afternoon. Labs to assess for various fungal pathogens are pending.  - Additional history per son [name] was obtained after admission: Sick in stomach began late jan , then mental fog, headaches, vomiting nightly in feb, then wasn't able to go to bathroom because walk became "shuffling" and "couldn't lift feet" . Then speech declined followed by urinary incontinence. Prior to this, report of pulsatile tinnitus  - Exam today reveals abulia with one-word answers to questions requiring  multiple requests to respond. Able to follow some simple motor commands. Mild motor agitation in response to stimulation that was seen yesterday has now resolved.  - EEG findings of generalized slowing are most consistent with a nonspecific encephalopathy. - CSF: - LP successfully completed in IR on 4/17. Opening pressure was elevated at 36 cm H2O. 15 cc of clear, straw colored CSF obtained before flow slowed to a halt. Closing pressure 10 cm H2O.- Initial CSF labs grossly abnormal with 60 WBC predominantly monocytes/macrophages (87%) with the remainder being lymphocytes, and no neutrophils. Protein markedly elevated at 365 mg/dL. Glucose 67.  - VDRL negative - CSF cultures including fungal culture are pending. - The CSF WBC profile is suggestive of a process that is most likely late subacute or chronic. - CSF gram stain is negative.   - Other labs. HIV negative.  - DDx: - Probable fungal meningitis progressing to communicating hydrocephalus - Given no fever or headache and also given that WBC count which was elevated on admission has been rapidly decreasing, in addition to lack of neutrophils in CSF, a bacterial or viral meningitis was felt to be highly unlikely. CSF infectious disease PCR panel for multiple viral and bacterial pathogens has come back negative. Cryptococcal antigen also negative.  - Discussed with ID. They feel that highest on DDx for infection with communicating  hydrocephalus secondary to fungal infection would be histoplasmosis, blastomycosis and coccidioides immitis, but this does not preclude other possible infectious processes - Of note, patient has had persistent leukocytosis on serial CBCs that also is most likely secondary to presumed fungal meningitis.  - Initially thought to be NPH atypically presenting with white cells in CSF (confirmed as being a rare occurrence by literature search), but this is now off the DDx.  - MRV did not reveal dural venous sinus thrombus or venous fistula, nor would this explain the remainder of his presentation.   - No evidence of strokes on MRI.  - No recent significant head trauma or infections.   - He has clinically improved somewhat following drainage of 15 cc of CSF with LP, consistent with communicating hydrocephalus. The patient has been assessed by PT post-LP. Per PT, after LP the patient did seem to do quite a bit better than previous PT sessions as well as the session he had this morning. Posterior lean was less significant, he was able to take some steps (with a lot of help), and he seemed to communicate more frequently. Overall he is still functioning very poorly, but this was definitely better than he has been prior to CSF drainage.    Recommendations: - Continue to avoid deliriogenic medications.  - Continue amphotericin B liposome (Ambisome) 500 mg IV qd and monitor for possible continued improvement (today is day 2) - CSF histomyces antigen, blastomyces ag, cocci antibodies, MTB PCR, Karius test, serum Coccidioides antibodies by CF and ID, urine histoplasma ag, blastomyces ag. Quantiferon gold, ACE level have been ordered by ID.  - Fungal cultures are pending. Of note, fungal cultures for the above organisms are low yield. ID has asked the lab to incubate cultures for 6 weeks   LOS: 3 days   @Electronically  signed: Dr. Caryl Pina 10/05/2022  5:16 PM

## 2022-10-05 NOTE — Progress Notes (Signed)
Due to elevated CBGs, ok to increase Semglee to 15 units qday per Dr Maryfrances Bunnell.  Ulyses Southward, PharmD, BCIDP, AAHIVP, CPP Infectious Disease Pharmacist 10/05/2022 10:05 AM

## 2022-10-05 NOTE — Progress Notes (Signed)
Physical Therapy Treatment Patient Details Name: Jeremy Medina. MRN: [id number] DOB: [birth year] Today's Date: 10/05/2022   History of Present Illness Jeremy Medina. is a 72 y.o. male who presents complaining of altered mental status and progressive decline.  MRI performed at [facility], concern for hydrocephalus and neurosurgeon recommended pt present to ED. Work-up pending. LP 10/03/2022. PMHx: type 2 diabetes, PTSD, chronic pain on methadone per son, hypertension, hyperlipidemia    PT Comments    Pt is progressing towards goals. Pt requires decreased assistance this session for bed mobility, sit to stand and transfers at Mod A with RW. Pt continues with slow initiation and difficulty with following directions requiring multiple multi modal cues to perform functional activities safely. Due to pt current functional status, home set up and available assistance at home recommending skilled physical therapy services at a higher level of care and frequency 5x/weekly on discharge from acute care hospital setting in order to work on functional mobility, strength, balance and activity tolerance to decrease burden of care, risk for falls and risk for re-hospitalization.    Recommendations for follow up therapy are one component of a multi-disciplinary discharge planning process, led by the attending physician.  Recommendations may be updated based on patient status, additional functional criteria and insurance authorization.  Follow Up Recommendations  Can patient physically be transported by private vehicle: No    Assistance Recommended at Discharge Frequent or constant Supervision/Assistance  Patient can return home with the following Two people to help with walking and/or transfers;Assistance with cooking/housework;Help with stairs or ramp for entrance;Assist for transportation   Equipment Recommendations  Other (comment) (defer to post acute)    Recommendations for Other Services        Precautions / Restrictions Precautions Precautions: Fall Precaution Comments: posterior lean Restrictions Weight Bearing Restrictions: No     Mobility  Bed Mobility Overal bed mobility: Needs Assistance Bed Mobility: Supine to Sit     Supine to sit: Mod assist     General bed mobility comments: Pt required Mod A at bil LE to initiate movement with multiple multi modal cues. Pt was then able to continue with movement but needed MIn A at trunk due to weakness. Patient Response: Cooperative  Transfers Overall transfer level: Needs assistance Equipment used: Rolling walker (2 wheels) Transfers: Sit to/from Stand Sit to Stand: Mod assist   Step pivot transfers: Mod assist       General transfer comment: Pt was Mod A for sit to stand with rocking and counting to get to standing. Pt then was able to take steps wtih intermittent verbal and tactile cues at the LE for which leg to step especially when backing up toward chair. Pt demonstrates posterior lean that requires Mod A to maintain upright balance.    Ambulation/Gait               General Gait Details: Unable to progress this session. Pt took a couple of steps from EOB to recliner with low foot clearance, partial step through with R >L and posterior lean.       Balance Overall balance assessment: Needs assistance Sitting-balance support: Feet supported, Bilateral upper extremity supported Sitting balance-Leahy Scale: Poor Sitting balance - Comments: posterior lean intermittently and with increased sitting without back support at EOB. When asked to lean forward pt would state " I am" as he was leaning posteriorly Postural control: Posterior lean Standing balance support: Bilateral upper extremity supported, Reliant on assistive device for balance Standing balance-Leahy  Scale: Poor Standing balance comment: Mod A to maintain upright balance.        Cognition Arousal/Alertness: Awake/alert Behavior During  Therapy: Flat affect Overall Cognitive Status: Difficult to assess           General Comments: Pt intermittently responded appropriately and in 1-2 words. Mostly grunting and agreeing           General Comments General comments (skin integrity, edema, etc.): When labs came in pt answered in full one to two word answers. Pt responds well to name. Mostly grunting and agreeing with most questions.      Pertinent Vitals/Pain Pain Assessment Pain Assessment: Faces Faces Pain Scale: Hurts a little bit Breathing: normal Negative Vocalization: occasional moan/groan, low speech, negative/disapproving quality Facial Expression: sad, frightened, frown Body Language: relaxed Consolability: no need to console PAINAD Score: 2 Pain Location: intermittent grimacing Pain Descriptors / Indicators: Grimacing Pain Intervention(s): Monitored during session, Limited activity within patient's tolerance     PT Goals (current goals can now be found in the care plan section) Acute Rehab PT Goals Patient Stated Goal: unable to state PT Goal Formulation: Patient unable to participate in goal setting Time For Goal Achievement: 10/13/22 Potential to Achieve Goals: Fair Progress towards PT goals: Progressing toward goals    Frequency    Min 2X/week      PT Plan Current plan remains appropriate       AM-PAC PT "6 Clicks" Mobility   Outcome Measure  Help needed turning from your back to your side while in a flat bed without using bedrails?: A Lot Help needed moving from lying on your back to sitting on the side of a flat bed without using bedrails?: A Lot Help needed moving to and from a bed to a chair (including a wheelchair)?: A Lot Help needed standing up from a chair using your arms (e.g., wheelchair or bedside chair)?: A Lot Help needed to walk in hospital room?: Total Help needed climbing 3-5 steps with a railing? : Total 6 Click Score: 10    End of Session Equipment Utilized  During Treatment: Gait belt   Patient left: in chair;with chair alarm set;with call bell/phone within reach Nurse Communication: Mobility status;Other (comment) (pt is acting hungry, lunch still in room; pt unable to feed self. RN notified. Pt was very thirsty and drank half a cup of water when offered.) PT Visit Diagnosis: Unsteadiness on feet (R26.81);Muscle weakness (generalized) (M62.81)     Time: 1610-9604 PT Time Calculation (min) (ACUTE ONLY): 20 min  Charges:  $Therapeutic Activity: 8-22 mins                    Harrel Carina, DPT, CLT  Acute Rehabilitation Services Office: [phone] (Secure chat preferred)   Claudia Desanctis 10/05/2022, 4:01 PM

## 2022-10-05 NOTE — Progress Notes (Signed)
Subjective:  Pt more verbal and interactive today   Antibiotics:  Anti-infectives (From admission, onward)    Start     Dose/Rate Route Frequency Ordered Stop   10/04/22 1500  amphotericin B liposome (AMBISOME) 500 mg in dextrose 5 % 500 mL IVPB        500 mg 312.5 mL/hr over 120 Minutes Intravenous Every 24 hours 10/04/22 1159         Medications: Scheduled Meds:  amLODipine  10 mg Oral Daily   dextrose  10 mL Intravenous Q24H   dextrose  10 mL Intravenous Q24H   folic acid  1 mg Oral Daily   insulin aspart  0-9 Units Subcutaneous TID WC   [START ON 10/06/2022] insulin glargine-yfgn  15 Units Subcutaneous Daily   lipase/protease/amylase  36,000 Units Oral TID AC   LORazepam  0.5 mg Oral QHS   methadone  10 mg Oral TID   sodium chloride  500 mL Intravenous Q24H   sodium chloride  500 mL Intravenous Q24H   sodium chloride flush  3 mL Intravenous Q12H   Continuous Infusions:  amphotericin B liposome (AMBISOME) 500 mg in dextrose 5 % 500 mL IVPB 500 mg (10/04/22 1653)   PRN Meds:.acetaminophen, acetaminophen, diphenhydrAMINE **OR** diphenhydrAMINE, meperidine (DEMEROL) injection, ondansetron **OR** ondansetron (ZOFRAN) IV, senna-docusate    Objective: Weight change:   Intake/Output Summary (Last 24 hours) at 10/05/2022 1400 Last data filed at 10/05/2022 1115 Gross per 24 hour  Intake --  Output 800 ml  Net -800 ml   Blood pressure (!) 159/94, pulse 94, temperature 98.1 F (36.7 C), temperature source Oral, resp. rate 16, height 5\' 10"  (1.778 m), weight 102.1 kg, SpO2 95 %. Temp:  [98.1 F (36.7 C)-98.5 F (36.9 C)] 98.1 F (36.7 C) (04/19 0725) Pulse Rate:  [92-97] 94 (04/19 0725) Resp:  [13-18] 16 (04/19 0725) BP: (145-171)/(87-99) 159/94 (04/19 0725) SpO2:  [92 %-95 %] 95 % (04/19 0725)  Physical Exam: Physical Exam Constitutional:      Appearance: Normal appearance. He is obese.  HENT:     Nose: Nose normal.  Eyes:     General:         Right eye: No discharge.        Left eye: No discharge.     Conjunctiva/sclera: Conjunctivae normal.  Pulmonary:     Effort: Pulmonary effort is normal. No respiratory distress.  Musculoskeletal:        General: Normal range of motion.     Cervical back: Normal range of motion and neck supple.  Skin:    General: Skin is warm and dry.  Neurological:     General: No focal deficit present.     Mental Status: He is alert. He is disoriented.      CBC:    BMET Recent Labs    10/04/22 0347 10/05/22 0450  NA 140 141  K 3.2* 4.2  CL 101 104  CO2 27 27  GLUCOSE 140* 220*  BUN 25* 21  CREATININE 0.97 0.99  CALCIUM 9.5 9.7     Liver Panel  Recent Labs    10/03/22 0427 10/04/22 0347  PROT 6.5 6.9  ALBUMIN 3.1* 3.1*  AST 30 32  ALT 45* 49*  ALKPHOS 66 65  BILITOT 0.6 0.7       Sedimentation Rate No results for input(s): "ESRSEDRATE" in the last 72 hours. C-Reactive Protein No results for input(s): "CRP" in the last 72 hours.  Micro Results: Recent Results (from the past 720 hour(s))  SARS Coronavirus 2 by RT PCR (hospital order, performed in [facility] hospital lab) *cepheid single result test* Anterior Nasal Swab     Status: None   Collection Time: 09/13/22 11:00 PM   Specimen: Anterior Nasal Swab  Result Value Ref Range Status   SARS Coronavirus 2 by RT PCR NEGATIVE NEGATIVE Final    Comment: Performed at [facility], [address], [place], [name] [address]  Culture, blood (Routine X 2) w Reflex to ID Panel     Status: None   Collection Time: 09/28/22 10:47 PM   Specimen: BLOOD LEFT FOREARM  Result Value Ref Range Status   Specimen Description BLOOD LEFT FOREARM  Final   Special Requests   Final    BOTTLES DRAWN AEROBIC AND ANAEROBIC Blood Culture adequate volume   Culture   Final    NO GROWTH 5 DAYS Performed at [facility], [address], [place], [name] [address]    Report Status 10/03/2022 FINAL  Final  Culture, blood (Routine  X 2) w Reflex to ID Panel     Status: None   Collection Time: 09/28/22 10:50 PM   Specimen: BLOOD RIGHT HAND  Result Value Ref Range Status   Specimen Description BLOOD RIGHT HAND  Final   Special Requests   Final    BOTTLES DRAWN AEROBIC ONLY Blood Culture results may not be optimal due to an inadequate volume of blood received in culture bottles   Culture   Final    NO GROWTH 5 DAYS Performed at [facility], [address] [place], [name] [address]    Report Status 10/03/2022 FINAL  Final  CSF culture w Gram Stain     Status: None (Preliminary result)   Collection Time: 10/03/22  2:11 PM   Specimen: PATH Cytology CSF; Cerebrospinal Fluid  Result Value Ref Range Status   Specimen Description CSF  Final   Special Requests NONE  Final   Gram Stain   Final    WBC PRESENT, PREDOMINANTLY MONONUCLEAR NO ORGANISMS SEEN CYTOSPIN SMEAR    Culture   Final    NO GROWTH 2 DAYS Performed at [facility], [address] [place], [name] [address]    Report Status PENDING  Incomplete  Culture, fungus without smear     Status: None (Preliminary result)   Collection Time: 10/03/22  2:11 PM   Specimen: CSF; Other  Result Value Ref Range Status   Specimen Description CSF  Final   Special Requests NONE  Final   Culture   Final    NO FUNGUS ISOLATED AFTER 2 DAYS Performed at [name], [address], [place], [name] [address]    Report Status PENDING  Incomplete    Studies/Results: DG FL GUIDED THERAPEUTIC LUMBAR PUNCTURE  Result Date: 10/03/2022 CLINICAL DATA:  Altered mental status and functional decline. Concern for possible normal pressure hydrocephalus. Request diagnostic and potentially therapeutic lumbar puncture. EXAM: DIAGNOSTIC AND THERAPEUTIC LUMBAR PUNCTURE UNDER FLUOROSCOPIC GUIDANCE COMPARISON:  CT abdomen and pelvis-08/02/2022 FLUOROSCOPY: 18 seconds (1.1 mGy) PROCEDURE: Informed consent was obtained from the patient's son prior to the procedure,  including potential complications of headache, allergy, and pain. With the patient prone, the lower back was prepped with Betadine. 1% Lidocaine was used for local anesthesia. Lumbar puncture was performed at the L2-L3 level using a 20 gauge needle with return of clear, colorless CSF with an opening  pressure of 36 cm water. Approximately 15 total ml of CSF were obtained before the flow slowed to a halt. Despite attempts to increased intra-abdominal pressure as well as tilting the table to improve flow, no further fluid could be obtained. Closing pressure of 10 cm water. Fluid was sent to the lab for the requested studies. The patient tolerated the procedure well and there were no apparent complications. IMPRESSION: Technically successful lumbar puncture from L2-L3 without complication. Procedure performed by Brayton El PA-C and supervised by Dr. Katherina Right Electronically Signed   By: Simonne Come M.D.   On: 10/03/2022 14:56      Assessment/Plan:  INTERVAL HISTORY: amphotericin begun   Principal Problem:   Aseptic meningitis Active Problems:   Chronic pain syndrome   Depression   Anxiety   Benign essential hypertension   Diabetes mellitus type 2, controlled, without complications   Hyperlipidemia   Acute kidney injury   Progressive subacute encephalopathy   Folate deficiency   Transaminitis   Obesity (BMI 30-39.9)    Jeremy Medina. is a 72 y.o. male with acute neurological deterioration since January thought to have normal pressure hydrocephalus but then had actually high intracranial pressures with lumbar puncture and CSF profile consistent with an aseptic chronic meningitis concerning for potential dimorphic fungal infection but also not without TB or malignancy in the differential. My understanding is that pseudotumor cerebri could also cause these abnormalities.  We have sent furthers fungal antigens and serologies from CSF and blood as well as MTB PCR from CSF.  We have  started the amphotericin to cover all 3 of most common dimorphic fungi in NA, histo, blasto and cocci  #1 Chronic aseptic meningitis:  I ordered additional tests today histoplasma urine antigen Blastomyces serum antigen, Coccidioides antibody bodies and serum by complement fixation immunodiffusion.  Will continue Amphotericin follow-up fungal cultures routine cultures and CSF test that we have ordered.  I think that the patient will need a repeat LP sometime in the next 10 days to reassess his CSF profile and response to therapy.  If he goes for another lumbar puncture would make sure we do get as large-volume as possible and also send at least 8 to 10 mL for AFB culture  I dont think he has TB (and we do have an MTB PCR cooking but ideally would also want CSF AFB culture as well  I have personally spent 52 minutes involved in face-to-face and non-face-to-face activities for this patient on the day of the visit. Professional time spent includes the following activities: Preparing to see the patient (review of tests), Obtaining and/or reviewing separately obtained history (admission/discharge record), Performing a medically appropriate examination and/or evaluation , Ordering medications/tests/procedures, referring and communicating with other health care professionals, Documenting clinical information in the EMR, Independently interpreting results (not separately reported), Communicating results to the patient/family/caregiver, Counseling and educating the patient/family/caregiver and Care coordination (not separately reported).   Dr. Luciana Axe is available for questions this weekend and new ID team is here on Monday.   LOS: 3 days   Acey Lav 10/05/2022, 2:00 PM

## 2022-10-05 NOTE — Progress Notes (Signed)
Progress Note   Patient: Jeremy Medina. GNF:[id number] DOB: Aug 07, 1950 DOA: 09/28/2022     3 DOS: the patient was seen and examined on 10/05/2022 at 11:21 AM      Brief hospital course: Mr. Delfin Squillace. is a 72 y.o. M with hx HTN, DM, chronic pain on methadone, PTSD/depression/anxiety on daily benzodiazepine who presented with concern for NPH.  Evidently recently admitted at [facility] for subacute encephalopathy (beginning ~Jan, 3 months PTA).  Work up there concerning for NPH, discharged to home with Neurology and Neurosurgery follow ups.  On day of admission, was seen in Neurosurgery clinic and sent to the ER for LP.  Incidentally found in the ER to have AKI Cr 2.3.    4/12: Admitted 4/14: Cr normalized, persistent encephalopathy noted 4/15: Neurology consulted 4/17: LP obtained 4/18: ID consulted, amphotericin started 4/19: Patient more alert     Assessment and Plan: * Aseptic meningitis    Progressive subacute encephalopathy Patient remained encephalopathic, mostly nonverbal, during first several days in hospital.     Neuraxial imaging here includes CT head and MRV which showed no acute process.   No improvement with IV thiamine.    Neurology consulted, LP obtained that showed elevated protein and WBC.  No substantial improvement with LP, Neurology suspected fungal meningitis.   Crypto antigen negative.   Meningitis PCR panel negative.  Amphotericin B started yesterday.  Last night and this morning, patient progressively more alert, more verbal. - Continue amphotericin - Stop thiamine  - Avoid deliriogenic medications - Appreciate Neuro expertise - Consult ID, appreciate expertise     Obesity (BMI 30-39.9) BMI 32  Transaminitis Mild, improving - Follow-up with PCP  Folate deficiency B12 deficiency ruled out - Continue folate supplement  Acute kidney injury Creatinine 2.3 on admission.  US renal unremarkable.  Improved with fluids down to baseline  0.9  Hyperlipidemia - Hold home Crestor  Diabetes mellitus type 2, controlled, without complications Glucoses elevated - Continue glargine, increase dose - Continue sliding scale corrections - Hold home 70/30 - Hold home Crestor  Benign essential hypertension BP slightly elevated - Continue amlodipine - Hold Lisinopril   Anxiety - Continue home Ativan, dose minimized  Depression Not on medication at home  Chronic pain syndrome - Continue home methadone          Subjective: No fever overnight, no new nursing concerns.  Patient seems to be more talkative, more alert today, he is still somewhat confused, told me "they think I'm pregnant".  Still with obvious psychomotor slowing.     Physical Exam: BP (!) 159/94 (BP Location: Left Arm)   Pulse 94   Temp 98.1 F (36.7 C) (Oral)   Resp 16   Ht  (1.778 m)   Wt 102.1 kg   SpO2 95%   BMI 32.28 kg/m   Appears disheveled and weak Tachycardic, regular, no murmurs, no peripheral edema Respiratory rate normal, lungs clear without rales or wheezes, respiratory effort poor overall diminished Abdomen with grimace to palpation diffusely but soft and without guarding, no distention, no focality Makes eye contact, attempts to respond to questions, has moderate psychomotor slowing, face symmetric, speech halting and slow, naming seems improved, he names a spoon, follows commands in a limited manner     Data Reviewed: Discussed with infectious disease Patient metabolic panel unremarkable  Family Communication:     Disposition: Status is: Inpatient Patient remains on IV Amphotericin and considerably altered        Author: Cristal Deer  Lamonte Sakai, MD 10/05/2022 12:02 PM  For on call review [email or web address].

## 2022-10-05 NOTE — Progress Notes (Signed)
Pharmacy Anti-infective Note  Jeremy Medina. is a 72 y.o. male admitted on 09/28/2022 with AMS/encephalopathy and concern for meningitis. LP done on 4/17 shows 40% CSF/serum glucose ratio, elevated protein with concern for fungal PNA. Crypto neg, additional testing sent for coccidioides, histoplasmosis, blastomyces, TB. Pharmacy has been consulted for empiric liposomal amphotericin B dosing.  The patient was noted to have AKI on admission - resolved and down to 0.97 prior to starting ampho B (2.2 on admission). SCr remains stable today at 0.99, K 4.2, Mg 2 - all wnl. No replacement needed today. Continue pre/post NS boluses w/ D5W flushes.   Plan: - Continue Liposomal Amphotericin B 500 mg (~5 mg/kg) IV every 24 hours - Pre/post NS fluid boluses of 500 cc + D5W flushes before/after drug administration - Will monitor lytes and renal function closely - daily BMET + Mg - Will follow-up on additional testing to determine duration of therapy   Height:  (177.8 cm) Weight: 102.1 kg (225 lb) IBW/kg (Calculated) : 73  Temp (24hrs), Avg:98.4 F (36.9 C), Min:98.1 F (36.7 C), Max:98.5 F (36.9 C)  Recent Labs  Lab 09/30/22 0500 10/01/22 0356 10/02/22 0459 10/03/22 0427 10/04/22 0347 10/05/22 0450  WBC 17.7* 14.0* 11.6* 12.2* 14.5*  --   CREATININE 1.27* 1.07 0.93 0.93 0.97 0.99     Estimated Creatinine Clearance: 83.1 mL/min (by C-G formula based on SCr of 0.99 mg/dL).    Allergies  Allergen Reactions   Bee Venom Anaphylaxis    Antimicrobials this admission: Ampho B >>  Dose adjustments this admission:   Microbiology results: 4/12 BCx >> ngx5d 4/17 CSF cx >> ng<24h  Thank you for allowing pharmacy to be a part of this patient's care.  Georgina Pillion, PharmD, BCPS Infectious Diseases Clinical Pharmacist 10/05/2022 7:36 AM   **Pharmacist phone directory can now be found on [email or web address] ([email or web address]).  Listed under [facility].

## 2022-10-06 DIAGNOSIS — G030 Nonpyogenic meningitis: Secondary | ICD-10-CM | POA: Diagnosis not present

## 2022-10-06 DIAGNOSIS — G02 Meningitis in other infectious and parasitic diseases classified elsewhere: Secondary | ICD-10-CM | POA: Diagnosis not present

## 2022-10-06 DIAGNOSIS — E785 Hyperlipidemia, unspecified: Secondary | ICD-10-CM | POA: Diagnosis not present

## 2022-10-06 DIAGNOSIS — G9340 Encephalopathy, unspecified: Secondary | ICD-10-CM | POA: Diagnosis not present

## 2022-10-06 DIAGNOSIS — E119 Type 2 diabetes mellitus without complications: Secondary | ICD-10-CM | POA: Diagnosis not present

## 2022-10-06 LAB — CULTURE, FUNGUS WITHOUT SMEAR

## 2022-10-06 LAB — BASIC METABOLIC PANEL
Anion gap: 14 (ref 5–15)
BUN: 24 mg/dL — ABNORMAL HIGH (ref 8–23)
CO2: 21 mmol/L — ABNORMAL LOW (ref 22–32)
Calcium: 9.3 mg/dL (ref 8.9–10.3)
Chloride: 103 mmol/L (ref 98–111)
Creatinine, Ser: 1.06 mg/dL (ref 0.61–1.24)
GFR, Estimated: >60 mL/min (ref >60)
Glucose, Bld: 245 mg/dL — ABNORMAL HIGH (ref 70–99)
Potassium: 3.9 mmol/L (ref 3.5–5.1)
Sodium: 138 mmol/L (ref 135–145)

## 2022-10-06 LAB — GLUCOSE, CAPILLARY
Glucose-Capillary: 234 mg/dL — ABNORMAL HIGH (ref 70–99)
Glucose-Capillary: 254 mg/dL — ABNORMAL HIGH (ref 70–99)
Glucose-Capillary: 188 mg/dL — ABNORMAL HIGH (ref 70–99)
Glucose-Capillary: 277 mg/dL — ABNORMAL HIGH (ref 70–99)
Glucose-Capillary: 307 mg/dL — ABNORMAL HIGH (ref 70–99)

## 2022-10-06 LAB — MAGNESIUM: Magnesium: 2.1 mg/dL (ref 1.7–2.4)

## 2022-10-06 MED ORDER — INSULIN GLARGINE-YFGN 100 UNIT/ML ~~LOC~~ SOLN
20.0000 [IU] | Freq: Every day | SUBCUTANEOUS | Status: DC
  Administered 2022-10-08: 20 [IU] via SUBCUTANEOUS
  Filled 2022-10-06 (×2): qty 0.2

## 2022-10-06 MED ORDER — INSULIN ASPART 100 UNIT/ML IJ SOLN
0.0000 [IU] | Freq: Three times a day (TID) | INTRAMUSCULAR | Status: DC
  Administered 2022-10-07: 8 [IU] via SUBCUTANEOUS
  Administered 2022-10-07: 5 [IU] via SUBCUTANEOUS
  Administered 2022-10-08 (×2): 8 [IU] via SUBCUTANEOUS
  Administered 2022-10-08: 3 [IU] via SUBCUTANEOUS
  Administered 2022-10-09: 5 [IU] via SUBCUTANEOUS
  Administered 2022-10-09: 8 [IU] via SUBCUTANEOUS
  Administered 2022-10-09: 3 [IU] via SUBCUTANEOUS
  Administered 2022-10-10 (×3): 5 [IU] via SUBCUTANEOUS
  Administered 2022-10-11: 8 [IU] via SUBCUTANEOUS
  Administered 2022-10-11: 2 [IU] via SUBCUTANEOUS
  Administered 2022-10-12 (×2): 3 [IU] via SUBCUTANEOUS
  Administered 2022-10-12 – 2022-10-13 (×2): 5 [IU] via SUBCUTANEOUS
  Administered 2022-10-13: 8 [IU] via SUBCUTANEOUS
  Administered 2022-10-13: 3 [IU] via SUBCUTANEOUS
  Administered 2022-10-14: 11 [IU] via SUBCUTANEOUS
  Administered 2022-10-14: 15 [IU] via SUBCUTANEOUS
  Administered 2022-10-14: 5 [IU] via SUBCUTANEOUS
  Administered 2022-10-15: 4 [IU] via SUBCUTANEOUS
  Administered 2022-10-15: 11 [IU] via SUBCUTANEOUS

## 2022-10-06 MED ORDER — INSULIN ASPART 100 UNIT/ML IJ SOLN
0.0000 [IU] | Freq: Every day | INTRAMUSCULAR | Status: DC
  Administered 2022-10-06: 4 [IU] via SUBCUTANEOUS
  Administered 2022-10-09 – 2022-10-13 (×3): 2 [IU] via SUBCUTANEOUS

## 2022-10-06 NOTE — Plan of Care (Signed)

## 2022-10-06 NOTE — Progress Notes (Signed)
  Progress Note   Patient: Jeremy Medina. ZOX:[id number] DOB: [birth year] DOA: 09/28/2022     4 DOS: the patient was seen and examined on 10/06/2022       Brief hospital course: Jeremy Medina. is a 72 y.o. M with hx HTN, DM, chronic pain on methadone, PTSD/depression/anxiety on daily benzodiazepine who presented with concern for fungal meningitis.   Assessment and Plan: * suspected fungal meningitis  Progressive subacute encephalopathy - Contineu amphotericin - Follow CSF studies      Folate deficiency B12 deficiency ruled out - Continue folate supplement  Diabetes mellitus type 2, controlled, without complications Glucoses elevated - Continue glargine, increase dose - Continue sliding scale corrections - Hold home 70/30 - Hold home Crestor  Benign essential hypertension BP slightly elevated - Continue amlodipine - Hold Lisinopril   Anxiety - Continue home Ativan, dose minimized  Chronic pain syndrome - Continue home methadone          Subjective: No acute change, no fever, no nursing concerns     Physical Exam: BP (!) 161/89   Pulse 91   Temp 98.1 F (36.7 C) (Oral)   Resp 15   Ht  (1.778 m)   Wt 102.1 kg   SpO2 100%   BMI 32.28 kg/m   Elderly adult male, lying in bed, severe psychomotor slowing, he speaks in short phrases, but most of them do not make sense, he answers some questions, not others, has increased tone in all 4 extremities    Data Reviewed: Basic metabolic panel shows normal sodium, potassium, renal function.  Hyperglycemia noted  Family Communication: None present    Disposition: Status is: Inpatient         Author: Alberteen Sam, MD 10/06/2022 5:16 PM  For on call review [email or web address].

## 2022-10-06 NOTE — Progress Notes (Addendum)
Late entry: Blood glucose was 405, taken immediately after the 5% dextrose flush that followed after the Amphotericin B. To recheck in the AM.  The AM blood glucose has come down. Now in the 200. To continue to monitor.

## 2022-10-06 NOTE — Progress Notes (Signed)
Neurology Progress Note  Brief HPI: 72 year old male with progressively worsening cognition, gait changes with falls, and urinary incontinence x 3 months.  Had normal functioning baseline prior to January. Admitted 09/28/22. PMH: chronic pain, depression, anxiety, HTN, DM2, HLD, folate deficiency, PTSD, hx of tobacco and alcohol abuse (quit drinking in his 30's; current smoker; 54 pack years), IDA, and lung nodules.  Subjective: Patient seen in room. He is lying sideways in the bed awake.  He is clutching a sock monkey.  Exam: Vitals:   10/06/22 0403 10/06/22 0750  BP: 120/85 (!) 161/89  Pulse: 88 91  Resp: 15   Temp: 98.4 F (36.9 C) 98.1 F (36.7 C)  SpO2: 100% 100%   Gen: In bed, NAD HEENT: [place]/AT; still with some increased neck extensor tone. Resp: non-labored breathing, no acute distress, CTA B/L Cardiac: RRR Abd: soft, diffusely tender throughout.  He grimaces throughout and then is able to state that he "needs to poop".  Neuro: Mental Status: Alert and awake.  He makes eye contact and is able to participate in the conversation somewhat.  There continues to be long pauses in between speech.  He is able to tell me his name and birthday correctly.  He has a sock monkey in the bed with him and he tells me his mother gave it to him.  He is not able to tell me where he is.  He answers incorrectly to orientation when given multiple choice.  He is able to state "I never asked" about why he doesn't know where he is. Cranial Nerves: II: Visual Fields are full. PERRL.   III,IV, VI: EOMI without ptosis or diplopia. Conjugate gaze. No nystagmus. V: Facial sensation is symmetric to touch. VII: Facial movement is symmetric resting and smiling VIII: Hearing is intact to voice X: not tested XI: symmetric. XII: not tested Motor: Tone seems normal today in BUE, BLE.  He is 4+/5 strength in BUE extensor and flexion and extensor BLE.  Could not get him to participate in flexion of BLE.   Sensory: Patient states that sensation is equal and intact to light touch in BUE and BLE. Cerebellar: He is able to participate in finger to nose testing BUE and has minimal ataxia in this activity. Gait: deferred for safety    Pertinent Labs: -Cryptococcal antigen from 10/03/2022 negative -Cytology for from 10/03/2022 negative for malignancy with lymphocyte predominant inflammation present -CSF from 10/03/2022 with elevated WBCs at 60, RBCs 18, monocyte predominant, remainder lymphocytes, no neutrophils. -CSF culture with no growth x 2 days. -Protein elevated in CSF at 365, glucose normal at 67 -Fungus culture negative for 2 days -Meningitis panel negative -HIV negative -VDRL CSF nonreactive -Blastomyces antigen pending -MT-RIF NAA w/ culture pending -Karius pending -QuantiFERON-TB gold send out pending  Imaging Reviewed/procedures: Lumbar puncture on 10/03/2022 and interventional radiology -Opening pressure elevated at 36 cm H2O, closing pressure 10 cm H2O -15 mL of clear, straw-colored CSF obtained before flow halted  EEG 10/02/22 Findings consistent with nonspecific encephalopathy  Assessment: Progressive neurological deterioration over 3+ months.  Initially felt to have NPH.  LP performed 10/03/22. ID is consulting and patient on amphotericin since 10/04/22 Various fungal pathogen testing pending as above. History from son as follows from prior note: "Sick in stomach began late jan , then mental fog, headaches, vomiting nightly in feb, then wasn't able to go to bathroom because walk became "shuffling" and "couldn't lift feet" . Then speech declined followed by urinary incontinence. 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 Viral or bacterial meningitis felt to be unlikely due to no fevers, no headaches, and leukocytosis at admission rapidly decreasing. No CVT on MRV No strokes on MRI, but  hydrocephalus is prominently noted.  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 Improving exam.  Recommendations: - Continue to avoid sedating or delirium inducing meds - Continue amphotericin B liposome per ID (day 3 due at 1600) - CSF histomyces antigen, blastomyces ag, cocci antibodies, MTB PCR, Karius test, serum Coccidioides antibodies by CF and ID, urine histoplasma ag, blastomyces ag. Quantiferon gold, ACE level have been ordered by ID  - f/u pending labs (although some of the fungal cultures are low yield.)  ID requesting 6 week incubation of cultures. - Recommend up in chair with safety measures as able. - PT/OT as exam continues to improve.  Patient seen and examined by NP/APP Leanord Hawking, AGACNP-BC [facility]  Electronically signed: Dr. Caryl Pina

## 2022-10-06 NOTE — Progress Notes (Signed)
Pharmacy Anti-infective Note  Jeremy Medina. is a 72 y.o. male admitted on 09/28/2022 with AMS/encephalopathy and concern for meningitis. LP done on 4/17 shows 40% CSF/serum glucose ratio, elevated protein with concern for fungal PNA. Crypto neg, additional testing sent for coccidioides, histoplasmosis, blastomyces, TB. Pharmacy has been consulted for empiric liposomal amphotericin B dosing.  The patient was noted to have AKI on admission - resolved and down to 0.97 prior to starting ampho B (2.2 on admission). SCr remains stable today at 1.06, K 3.9, Mg 2.1 - all wnl. No replacement needed today. Continue pre/post NS boluses w/ D5W flushes.   The fluid, flush, and medication schedule is below:  Sodium Chloride bolus over an hour at ~1400 Dex 5% flush at ~1500  Ampho dose due at ~1600 daily  Dex 5% flush at ~1700  Sodium Chloride over an hour at ~1700  Plan: - Continue Liposomal Amphotericin B 500 mg (~5 mg/kg) IV every 24 hours - Pre/post NS fluid boluses of 500 cc + D5W flushes before/after drug administration - Will monitor lytes and renal function closely - daily BMET + Mg - Will follow-up on additional testing to determine duration of therapy   Height:  (177.8 cm) Weight: 102.1 kg (225 lb) IBW/kg (Calculated) : 73  Temp (24hrs), Avg:98.2 F (36.8 C), Min:97.9 F (36.6 C), Max:98.4 F (36.9 C)  Recent Labs  Lab 09/30/22 0500 10/01/22 0356 10/02/22 0459 10/03/22 0427 10/04/22 0347 10/05/22 0450 10/06/22 0438  WBC 17.7* 14.0* 11.6* 12.2* 14.5*  --   --   CREATININE 1.27* 1.07 0.93 0.93 0.97 0.99 1.06     Estimated Creatinine Clearance: 77.6 mL/min (by C-G formula based on SCr of 1.06 mg/dL).    Allergies  Allergen Reactions   Bee Venom Anaphylaxis    Antimicrobials this admission: Ampho B >>  Dose adjustments this admission:   Microbiology results: 4/12 BCx >> ngx5d 4/17 CSF cx >> ng<24h  Thank you for allowing pharmacy to be a part  of this patient's care.  Blane Ohara, PharmD  PGY1 Pharmacy Resident     **Pharmacist phone directory can now be found on [email or web address] ([email or web address]).  Listed under [facility].

## 2022-10-07 DIAGNOSIS — G02 Meningitis in other infectious and parasitic diseases classified elsewhere: Secondary | ICD-10-CM | POA: Diagnosis not present

## 2022-10-07 DIAGNOSIS — N179 Acute kidney failure, unspecified: Secondary | ICD-10-CM | POA: Diagnosis not present

## 2022-10-07 DIAGNOSIS — G912 (Idiopathic) normal pressure hydrocephalus: Secondary | ICD-10-CM | POA: Diagnosis not present

## 2022-10-07 DIAGNOSIS — R4182 Altered mental status, unspecified: Secondary | ICD-10-CM | POA: Diagnosis not present

## 2022-10-07 DIAGNOSIS — E876 Hypokalemia: Secondary | ICD-10-CM | POA: Insufficient documentation

## 2022-10-07 DIAGNOSIS — G030 Nonpyogenic meningitis: Secondary | ICD-10-CM | POA: Diagnosis not present

## 2022-10-07 DIAGNOSIS — R1310 Dysphagia, unspecified: Secondary | ICD-10-CM

## 2022-10-07 LAB — GLUCOSE, CAPILLARY
Glucose-Capillary: 231 mg/dL — ABNORMAL HIGH (ref 70–99)
Glucose-Capillary: 144 mg/dL — ABNORMAL HIGH (ref 70–99)
Glucose-Capillary: 276 mg/dL — ABNORMAL HIGH (ref 70–99)
Glucose-Capillary: 145 mg/dL — ABNORMAL HIGH (ref 70–99)

## 2022-10-07 LAB — QUANTIFERON-TB GOLD PLUS (RQFGPL)
QuantiFERON Mitogen Value: 2.12 IU/mL
QuantiFERON Nil Value: 0 IU/mL
QuantiFERON TB1 Ag Value: 0 IU/mL
QuantiFERON TB2 Ag Value: 0 IU/mL

## 2022-10-07 LAB — BASIC METABOLIC PANEL
Anion gap: 8 (ref 5–15)
BUN: 22 mg/dL (ref 8–23)
CO2: 25 mmol/L (ref 22–32)
Calcium: 9 mg/dL (ref 8.9–10.3)
Chloride: 104 mmol/L (ref 98–111)
Creatinine, Ser: 1.11 mg/dL (ref 0.61–1.24)
GFR, Estimated: >60 mL/min (ref >60)
Glucose, Bld: 212 mg/dL — ABNORMAL HIGH (ref 70–99)
Potassium: 2.9 mmol/L — ABNORMAL LOW (ref 3.5–5.1)
Sodium: 137 mmol/L (ref 135–145)

## 2022-10-07 LAB — CBC
HCT: 37.3 % — ABNORMAL LOW (ref 39.0–52.0)
Hemoglobin: 12.7 g/dL — ABNORMAL LOW (ref 13.0–17.0)
MCH: 28.3 pg (ref 26.0–34.0)
MCHC: 34 g/dL (ref 30.0–36.0)
MCV: 83.3 fL (ref 80.0–100.0)
Platelets: 200 10*3/uL (ref 150–400)
RBC: 4.48 MIL/uL (ref 4.22–5.81)
RDW: 14.3 % (ref 11.5–15.5)
WBC: 10.7 10*3/uL — ABNORMAL HIGH (ref 4.0–10.5)
nRBC: 0 % (ref 0.0–0.2)

## 2022-10-07 LAB — CULTURE, FUNGUS WITHOUT SMEAR: Culture: NO GROWTH

## 2022-10-07 LAB — QUANTIFERON-TB GOLD PLUS: QuantiFERON-TB Gold Plus: NEGATIVE

## 2022-10-07 LAB — MAGNESIUM: Magnesium: 1.9 mg/dL (ref 1.7–2.4)

## 2022-10-07 LAB — CSF CULTURE W GRAM STAIN: Culture: NO GROWTH

## 2022-10-07 MED ORDER — POTASSIUM CHLORIDE CRYS ER 20 MEQ PO TBCR: 40.0000 meq | EXTENDED_RELEASE_TABLET | Freq: Once | ORAL | Status: DC

## 2022-10-07 MED ORDER — POTASSIUM CHLORIDE 10 MEQ/100ML IV SOLN
10.0000 meq | INTRAVENOUS | Status: AC
  Administered 2022-10-07 (×5): 10 meq via INTRAVENOUS
  Filled 2022-10-07 (×5): qty 100

## 2022-10-07 MED ORDER — POTASSIUM CHLORIDE 10 MEQ/100ML IV SOLN
10.0000 meq | INTRAVENOUS | Status: DC
  Administered 2022-10-07: 10 meq via INTRAVENOUS
  Filled 2022-10-07: qty 100

## 2022-10-07 MED ORDER — MAGNESIUM SULFATE IN D5W 1-5 GM/100ML-% IV SOLN
1.0000 g | Freq: Once | INTRAVENOUS | Status: AC
  Administered 2022-10-07: 1 g via INTRAVENOUS
  Filled 2022-10-07: qty 100

## 2022-10-07 NOTE — Progress Notes (Signed)
Pt unable to eat. Pt coughs and vomits. Will hold feeding and notify MD

## 2022-10-07 NOTE — Assessment & Plan Note (Signed)
Expected with Ampho - Supplement K per [facility]

## 2022-10-07 NOTE — Assessment & Plan Note (Signed)
Likely due to encephalopathy - Consult SLP

## 2022-10-07 NOTE — Progress Notes (Signed)
Neurology Progress Note  Brief HPI: 72 year old male with progressively worsening cognition, gait changes with falls, and urinary incontinence x 3 months.  Had normal functioning baseline prior to January. Admitted 09/28/22. PMH: chronic pain, depression, anxiety, HTN, DM2, HLD, folate deficiency, PTSD, hx of tobacco and alcohol abuse (quit drinking in his 30's; current smoker; 54 pack years), IDA, and lung nodules.  Subjective: Patient in bed this morning holding a washcloth in his hand.  He is awake and alert.  His communication is less today as compared to yesterday and he agitates easier this morning.  His nurse states that when feeding him he threw up little bits of bile.  He is gagging some during my time in the room with no real emesis.  RN at bedside during exam.  RN has notified hospitalist of the swallowing/ gagging difficulties.  Exam: Vitals:   10/07/22 0436 10/07/22 0844  BP: (!) 129/93 125/84  Pulse: 86 81  Resp: 15 18  Temp: 97.7 F (36.5 C) 97.9 F (36.6 C)  SpO2: 97% 99%   Gen: awake and alert in bed in no acute distress. HEENT: [place]/AT; still with some increased neck extensor tone. Resp: coughing a little; normal WOB.  Equal chest rise. Abd: soft; still tender to touch. (RN to notify primary team)  Neuro: Mental Status: He remains alert and awake.  He participates seemingly as he desires.  He grimaces when his RN talks loudly.  He requests that we not move him even though he is slouching in the bed.  He is poorly cooperative with commands, appearing as though unable initially. However, when NP promised to leave him alone if he can follow a command, he does briskly and accurately follow a simple command to "show two fingers".  He is less communicative today than yesterday.   Cranial Nerves: II: PERRL.   III,IV, VI: EOMI without ptosis or diplopia. Conjugate gaze. No nystagmus. V: patient does not answer questions about facial sensation and therefore unable to assess. VII:  Facial movement is symmetric at rest. VIII: Hearing is intact to voice Motor: He is moving all four extremities without focal motor weakness.  He does not participate with motor exam, but briskly withdraws legs to noxious stimulus and holds his arms up at his free will, holding his sock monkey and emesis bag.  Tone normal.  Sensory: Withdrawal of BLE to noxious stimulus. Cerebellar: Not able to assess. Gait: Deferred for safety    Pertinent Labs: -Cryptococcal antigen from 10/03/2022 negative -Cytology for from 10/03/2022 negative for malignancy with lymphocyte predominant inflammation present -CSF from 10/03/2022 with elevated WBCs at 60, RBCs 18, monocyte predominant, remainder lymphocytes, no neutrophils. -CSF culture with no growth x 2 days. -Protein elevated in CSF at 365, glucose normal at 67 -Fungus culture negative for 2 days -Meningitis panel negative -HIV negative -VDRL CSF nonreactive -Blastomyces antigen pending -MT-RIF NAA w/ culture pending -Karius pending -QuantiFERON-TB gold send out pending  Imaging Reviewed/procedures: Lumbar puncture on 10/03/2022 and interventional radiology -Opening pressure elevated at 36 cm H2O, closing pressure 10 cm H2O -15 mL of clear, straw-colored CSF obtained before flow halted  EEG 10/02/22 Findings consistent with nonspecific encephalopathy  Assessment: 72 year old male presenting with progressively worsening cognition, gait changes with falls, and urinary incontinence x 3 months, on a baseline of normal functioning prior to January. Per his son, the patient started feeling sick in stomach began late January , then mental fog, headaches, vomiting nightly in February, then wasn't able to go  to bathroom because gait became "shuffling" and he "couldn't lift his feet" . Then speech declined followed by urinary incontinence. Prior to the onset of above symptoms in January, he did report pulsatile tinnitus. Imaging from prior evaluations at  outside facilities revealed ballooning of the ventricles which was also present on repeat scanning this admission. Initially thought to have NPH; however, LP performed on Wednesday revealed elevated opening pressure and CSF pleocytosis consisting predominantly of macrophages/monocytes with no neutrophils, as well as markedly elevated CSF protein. 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 ID is consulting and the patient was started on IV amphotericin on Thursday (4/18), with some improvement over the past 3 days. Labs to assess for various fungal pathogens are pending. EEG was most consistent with a nonspecific encephalopathy.  - Exam today reveals a poorly cooperative, abulic patient who can follow a simple command if motivated.  - CSF: - LP successfully completed in IR on 4/17. Opening pressure was elevated at 36 cm H2O. 15 cc of clear, straw colored CSF obtained before flow slowed to a halt. Closing pressure 10 cm H2O.- Initial CSF labs grossly abnormal with 60 WBC predominantly monocytes/macrophages (87%) with the remainder being lymphocytes, and no neutrophils. Protein markedly elevated at 365 mg/dL. Glucose 67.  - VDRL negative - CSF cultures including fungal culture are pending. - The CSF WBC profile is suggestive of a process that is most likely late subacute or chronic. - CSF gram stain is negative.   - Of note, he did clinically improve in the immediate time frame after removal of 15 cc of CSF with LP, consistent with communicating hydrocephalus, with elevated CSF pressure contributing to his AMS. The patient was assessed by PT post-LP and the patient did seem to do quite a bit better than previous PT sessions. Posterior lean was less significant, he was able to take some steps (with a lot of help), and he seemed to communicate more frequently. Overall it was felt that he was definitely better after CSF drainage than he was prior to the procedure.  - Other labs. HIV negative.  -  DDx: - Probable fungal meningitis progressing to communicating hydrocephalus - Given no fever or headache and also given that WBC count which was elevated on admission has been rapidly decreasing, in addition to lack of neutrophils in CSF, a bacterial or viral meningitis was felt to be highly unlikely. CSF infectious disease PCR panel for multiple viral and bacterial pathogens has come back negative. Cryptococcal antigen also negative.  - Discussed with ID. They feel that highest on DDx for infection with communicating hydrocephalus secondary to fungal infection would be histoplasmosis, blastomycosis and coccidioides immitis, but this does not preclude other possible infectious processes - Of note, patient has had persistent leukocytosis on serial CBCs that also is most likely secondary to presumed fungal meningitis.  - MRV did not reveal dural venous sinus thrombus or venous fistula, nor would this explain the remainder of his presentation.   - No evidence of strokes on MRI.   Impression: 72 year old male with probable fungal meningitis progressing to communicating hydrocephalus. Improving exam.  Recommendations: - Continue to avoid sedating and deliriogenic medications.  - Continue amphotericin B liposome (Ambisome) 500 mg IV qd and monitor for possible continued improvement (today is day 4) - CSF histomyces antigen, blastomyces ag, cocci antibodies, MTB PCR, Karius test, serum Coccidioides antibodies by CF and ID, urine histoplasma ag, blastomyces ag. Quantiferon gold, ACE level have been ordered by ID.  - Fungal cultures  are pending. Of note, fungal cultures for the above organisms are low yield. ID has asked the lab to incubate cultures for 6 weeks Initially felt to have NPH.  - Primary team to address abdominal tenderness, difficulty swallowing, and concerns for nausea. - Recommend up in chair with safety measures as able. - PT/OT as exam continues to improve.   Leanord Hawking,  AGACNP-BC; MD to evaluate and make further recommendations as needed. [facility]  Electronically signed: Dr. Caryl Pina

## 2022-10-07 NOTE — Progress Notes (Signed)
Potassium 2.9.  MD notified
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
## 2022-10-07 NOTE — Progress Notes (Signed)
Pharmacy Anti-infective Note  Jeremy Medina. is a 72 y.o. male admitted on 09/28/2022 with AMS/encephalopathy and concern for meningitis. LP done on 4/17 shows 40% CSF/serum glucose ratio, elevated protein with concern for fungal PNA. Crypto neg, additional testing sent for coccidioides, histoplasmosis, blastomyces, TB. Pharmacy has been consulted for empiric liposomal amphotericin B dosing.  The patient was noted to have AKI on admission - resolved and down to 0.97 prior to starting ampho B (2.2 on admission).   SCr remains stable today at 1.11, K 2.9 (supplemented with 60 mEq, Mg 1.9 (supplemented with 1g IV Mg). Continue pre/post NS boluses w/ D5W flushes.   The fluid, flush, and medication schedule is below:  Sodium Chloride bolus over an hour at ~1400 Dex 5% flush at ~1500  Ampho dose due at ~1600 daily  Dex 5% flush at ~1700  Sodium Chloride over an hour at ~1700  Plan: - Continue Liposomal Amphotericin B 500 mg (~5 mg/kg) IV every 24 hours - Pre/post NS fluid boluses of 500 cc + D5W flushes before/after drug administration - Will monitor lytes and renal function closely - daily BMET + Mg - Will follow-up on additional testing to determine duration of therapy   Height:  (177.8 cm) Weight: 102.1 kg (225 lb) IBW/kg (Calculated) : 73  Temp (24hrs), Avg:97.9 F (36.6 C), Min:97.7 F (36.5 C), Max:98.1 F (36.7 C)  Recent Labs  Lab 10/01/22 0356 10/02/22 0459 10/03/22 0427 10/04/22 0347 10/05/22 0450 10/06/22 0438 10/07/22 0318  WBC 14.0* 11.6* 12.2* 14.5*  --   --  10.7*  CREATININE 1.07 0.93 0.93 0.97 0.99 1.06 1.11     Estimated Creatinine Clearance: 74.1 mL/min (by C-G formula based on SCr of 1.11 mg/dL).    Allergies  Allergen Reactions   Bee Venom Anaphylaxis    Antimicrobials this admission: Ampho B >>  Dose adjustments this admission:   Microbiology results: 4/12 BCx >> ngx5d 4/17 CSF cx >> ng<24h  Thank you for allowing  pharmacy to be a part of this patient's care.  Blane Ohara, PharmD  PGY1 Pharmacy Resident     **Pharmacist phone directory can now be found on [email or web address] ([email or web address]).  Listed under [facility].

## 2022-10-07 NOTE — Plan of Care (Signed)

## 2022-10-07 NOTE — Progress Notes (Signed)
Progress Note   Patient: Jeremy Medina. ZOX:[id number] DOB: 1951-03-23 DOA: 09/28/2022     5 DOS: the patient was seen and examined on 10/07/2022 at 10:59 AM      Brief hospital course: [name]. is a 72 y.o. M with hx HTN, DM, chronic pain on methadone, PTSD/depression/anxiety on daily benzodiazepine who presented with concern for NPH.  Evidently recently admitted at [facility] for subacute encephalopathy (beginning ~Jan, 3 months PTA).  Work up there concerning for NPH, discharged to home with Neurology and Neurosurgery follow ups.  On day of admission, was seen in Neurosurgery clinic and sent to the ER for LP.  Incidentally found in the ER to have AKI Cr 2.3.    4/12: Admitted 4/14: Cr normalized, persistent encephalopathy noted 4/15: Neurology consulted 4/17: LP obtained 4/18: ID consulted, amphotericin started 4/19: Patient more alert 4/21: Mentation improving    Assessment and Plan: * Suspected fungal meningitis    Progressive subacute encephalopathy Patient remained encephalopathic, mostly nonverbal, during first several days in hospital.   Neuraxial imaging with CT head and MRV showed no acute process.   No improvement with IV thiamine.    Neurology consulted, LP obtained that showed elevated protein and WBC.  No substantial improvement with LP, Neurology suspected fungal meningitis.   Crypto antigen negative.   Meningitis PCR panel negative.  There was considered possibility of NPH, but this was ruled out with elevated LP pressure.  Amphotericin B started and now clinically improving. - Continue amphotericin - Appreciate Neuro expertise - Consult ID, appreciate expertise     Dysphagia Likely due to encephalopathy - Consult SLP  Hypokalemia - Supplement K  Obesity (BMI 30-39.9) BMI 32  Transaminitis Mild, improving - Follow-up with PCP  Folate deficiency B12 deficiency ruled out - Continue folate supplement  Acute kidney injury Creatinine 2.3  on admission.  US renal unremarkable.  Improved with fluids down to baseline 0.9  Hyperlipidemia - Hold home Crestor  Diabetes mellitus type 2, controlled, without complications Glucose elevated yeserday, improving - Continue glargine, increase dose - Continue sliding scale corrections, increase dose - Hold home 70/30 - Hold home Crestor  Benign essential hypertension BP normal  - Continue amlodipine - Hold Lisinopril   Anxiety - Continue home Ativan, dose minimized  Depression Not on medication at home  Chronic pain syndrome - Continue home methadone          Subjective: No fever, no seizures, patient gradually more alert and responsive to questions.  Nursing noticed some mild difficulty swallowing today, so speech has been consulted.     Physical Exam: BP 125/84 (BP Location: Left Arm)   Pulse 81   Temp 97.9 F (36.6 C)   Resp 18   Ht  (1.778 m)   Wt 102.1 kg   SpO2 99%   BMI 32.28 kg/m   Elderly adult male, lying in bed, appears disheveled RRR, no murmurs, no peripheral edema Respiratory rate normal, lung sounds clear without rales or wheezes Abdomen soft without tenderness palpation or guarding Tone is increased in all 4 extremities, but appears to be symmetric and 5/5 He has marked psychomotor slowing, and a mild tremor, which gives some short answers to questions, seems oriented to place, year, self, but when asked about family says that his "parents" are his only family, and that they are still alive, which I find doubtful (although I have not obtained collateral yet)  Data Reviewed: Basic metabolic panel shows potassium 92.9, glucose elevated  yesterday White blood cell count now normalized Creatinine normal Cytology of the CSF is normal VDRL is normal Histo, Coccidioides, and blasto from [facility] are still pending    Family Communication:     Disposition: Status is: Inpatient The patient remains encephalopathic, requires ongoing  Amphotericin        Author: Alberteen Sam, MD 10/07/2022 12:42 PM  For on call review [email or web address].

## 2022-10-08 DIAGNOSIS — G9340 Encephalopathy, unspecified: Secondary | ICD-10-CM | POA: Diagnosis not present

## 2022-10-08 DIAGNOSIS — R1110 Vomiting, unspecified: Secondary | ICD-10-CM | POA: Insufficient documentation

## 2022-10-08 DIAGNOSIS — G030 Nonpyogenic meningitis: Secondary | ICD-10-CM | POA: Diagnosis not present

## 2022-10-08 DIAGNOSIS — E119 Type 2 diabetes mellitus without complications: Secondary | ICD-10-CM | POA: Diagnosis not present

## 2022-10-08 DIAGNOSIS — E785 Hyperlipidemia, unspecified: Secondary | ICD-10-CM | POA: Diagnosis not present

## 2022-10-08 LAB — GLUCOSE, CAPILLARY
Glucose-Capillary: 146 mg/dL — ABNORMAL HIGH (ref 70–99)
Glucose-Capillary: 168 mg/dL — ABNORMAL HIGH (ref 70–99)
Glucose-Capillary: 259 mg/dL — ABNORMAL HIGH (ref 70–99)
Glucose-Capillary: 259 mg/dL — ABNORMAL HIGH (ref 70–99)

## 2022-10-08 LAB — COMPREHENSIVE METABOLIC PANEL
ALT: 58 U/L — ABNORMAL HIGH (ref 0–44)
AST: 48 U/L — ABNORMAL HIGH (ref 15–41)
Albumin: 2.7 g/dL — ABNORMAL LOW (ref 3.5–5.0)
Alkaline Phosphatase: 71 U/L (ref 38–126)
Anion gap: 12 (ref 5–15)
BUN: 20 mg/dL (ref 8–23)
CO2: 24 mmol/L (ref 22–32)
Calcium: 9.2 mg/dL (ref 8.9–10.3)
Chloride: 101 mmol/L (ref 98–111)
Creatinine, Ser: 1.1 mg/dL (ref 0.61–1.24)
GFR, Estimated: >60 mL/min (ref >60)
Glucose, Bld: 263 mg/dL — ABNORMAL HIGH (ref 70–99)
Potassium: 2.7 mmol/L — CL (ref 3.5–5.1)
Sodium: 137 mmol/L (ref 135–145)
Total Bilirubin: 0.6 mg/dL (ref 0.3–1.2)
Total Protein: 5.7 g/dL — ABNORMAL LOW (ref 6.5–8.1)

## 2022-10-08 LAB — CBC
HCT: 38.5 % — ABNORMAL LOW (ref 39.0–52.0)
Hemoglobin: 12.9 g/dL — ABNORMAL LOW (ref 13.0–17.0)
MCH: 27.7 pg (ref 26.0–34.0)
MCHC: 33.5 g/dL (ref 30.0–36.0)
MCV: 82.8 fL (ref 80.0–100.0)
Platelets: 188 10*3/uL (ref 150–400)
RBC: 4.65 MIL/uL (ref 4.22–5.81)
RDW: 13.9 % (ref 11.5–15.5)
WBC: 9.9 10*3/uL (ref 4.0–10.5)
nRBC: 0 % (ref 0.0–0.2)

## 2022-10-08 LAB — MAGNESIUM: Magnesium: 1.8 mg/dL (ref 1.7–2.4)

## 2022-10-08 MED ORDER — POTASSIUM CHLORIDE 10 MEQ/100ML IV SOLN
10.0000 meq | INTRAVENOUS | Status: AC
  Administered 2022-10-08 (×5): 10 meq via INTRAVENOUS
  Filled 2022-10-08 (×5): qty 100

## 2022-10-08 MED ORDER — MAGNESIUM SULFATE 2 GM/50ML IV SOLN
2.0000 g | Freq: Once | INTRAVENOUS | Status: AC
  Administered 2022-10-08: 2 g via INTRAVENOUS
  Filled 2022-10-08: qty 50

## 2022-10-08 MED ORDER — POTASSIUM CHLORIDE 20 MEQ PO PACK
40.0000 meq | PACK | Freq: Once | ORAL | Status: AC
  Administered 2022-10-08: 40 meq via ORAL
  Filled 2022-10-08: qty 2

## 2022-10-08 MED ORDER — POTASSIUM CHLORIDE 20 MEQ PO PACK
40.0000 meq | PACK | Freq: Four times a day (QID) | ORAL | Status: AC
  Administered 2022-10-08 (×2): 40 meq via ORAL
  Filled 2022-10-08 (×2): qty 2

## 2022-10-08 MED ORDER — INSULIN GLARGINE-YFGN 100 UNIT/ML ~~LOC~~ SOLN
24.0000 [IU] | Freq: Every day | SUBCUTANEOUS | Status: DC
  Administered 2022-10-09 – 2022-10-10 (×2): 24 [IU] via SUBCUTANEOUS
  Filled 2022-10-08 (×2): qty 0.24

## 2022-10-08 NOTE — Progress Notes (Signed)
NEUROLOGY CONSULTATION PROGRESS NOTE   Date of service: October 08, 2022 Patient Name: Jeremy Medina. MRN:  [id number] DOB:  [birth year]  Brief HPI  72 year old male with progressively worsening cognition, gait changes with falls, and urinary incontinence x 3 months. Had normal functioning baseline prior to January. Admitted 09/28/22. PMH: chronic pain, depression, anxiety, HTN, DM2, HLD, folate deficiency, PTSD, hx of tobacco and alcohol abuse (quit drinking in his 30's; current smoker; 54 pack years), IDA, and lung nodules.  Interval Hx   Spoke with son [name] who reports slight improvement since yesterday and significant improvement overall.  Vitals   Vitals:   10/07/22 0844 10/07/22 2048 10/08/22 0458 10/08/22 0738  BP: 125/84 (!) 154/93 (!) 161/92 (!) 153/92  Pulse: 81 85 84 84  Resp: Temp: 97.9 F (36.6 C) 98.1 F (36.7 C) (!) 97.5 F (36.4 C) 98.4 F (36.9 C)  TempSrc:  Oral Oral Oral  SpO2: 99% 99% 95% 98%  Weight:      Height:         Body mass index is 32.28 kg/m.  Physical Exam   General: sitting comfortably in recliner; in no acute distress. HENT: Normal oropharynx and mucosa. Normal external appearance of ears and nose.  Neck: Supple, no pain or tenderness  CV: No JVD. No peripheral edema.  Pulmonary: Symmetric Chest rise. Normal respiratory effort.  Abdomen: Soft to touch, non-tender.  Ext: No cyanosis, edema, or deformity  Skin: No rash. Normal palpation of skin.   Musculoskeletal: Normal digits and nails by inspection. No clubbing.   Neurologic Examination  Mental status/Cognition: Alert, makes eye contact. Intermittently follows 1 step commands. Speech/language: non fluent, dysarthric speech, comprehension intact to simple commands,names thumb, gloves. Does not repeat. Seems to participate as he desires.  Cranial nerves:   CN II Pupils equal and reactive to light, blinks to threat BL   CN III,IV,VI EOM intact, no gaze preference or  deviation, no nystagmus   CN V normal sensation in V1, V2, and V3 segments bilaterally   CN VII Symmetric facial grimace.   CN VIII Makes eye contact to speech   CN IX & X Protecting airway but would not open his mouth on command   CN XI Head midline.   CN XII Does not protrude tongue on command.   Motor:  Muscle bulk: normal, tone normal Moves all extremities spontaneously and purposefully. Wiggles toes on commands, legs drift down when held up off the recliner. Gives thumbs up with bilateral upper extremities and squeezes fingers with bilateral upper extremity on command.  Sensation:  Light touch    Pin prick Localizes to pinch in all extremities.   Temperature    Vibration   Proprioception    Coordination/Complex Motor:  Unable to assess.  Labs   Basic Metabolic Panel:  Lab Results  Component Value Date   NA 137 10/08/2022   K 2.7 (LL) 10/08/2022   CO2 24 10/08/2022   GLUCOSE 263 (H) 10/08/2022   BUN 20 10/08/2022   CREATININE 1.10 10/08/2022   CALCIUM 9.2 10/08/2022   GFRNONAA >60 10/08/2022   GFRAA >60 10/11/2015   HbA1c:  Lab Results  Component Value Date   HGBA1C 9.7 (H) 09/13/2022   LDL: No results found for: "LDLCALC" Urine Drug Screen:     Component Value Date/Time   LABOPIA NONE DETECTED 09/14/2022 0357   COCAINSCRNUR NONE DETECTED 09/14/2022 0357   COCAINSCRNUR NEG 10/19/2015 1332   LABBENZ NONE DETECTED  09/14/2022 0357   AMPHETMU NONE DETECTED 09/14/2022 0357   THCU NONE DETECTED 09/14/2022 0357   LABBARB NONE DETECTED 09/14/2022 0357    Alcohol Level     Component Value Date/Time   ETH <10 09/28/2022 1535   No results found for: "PHENYTOIN", "ZONISAMIDE", "LAMOTRIGINE", "LEVETIRACETA" No results found for: "PHENYTOIN", "PHENOBARB", "VALPROATE", "CBMZ"  Imaging and Diagnostic studies   Pertinent Labs: -Cryptococcal antigen from 10/03/2022 negative -Cytology for from 10/03/2022 negative for malignancy with lymphocyte predominant inflammation  present -CSF from 10/03/2022 with elevated WBCs at 60, RBCs 18, monocyte predominant, remainder lymphocytes, no neutrophils. -CSF culture with no growth x 2 days. -Protein elevated in CSF at 365, glucose normal at 67 -Fungus culture negative for 2 days -Meningitis panel negative -HIV negative -VDRL CSF nonreactive -Blastomyces antigen pending -MT-RIF NAA w/ culture pending -Karius pending -QuantiFERON-TB gold send out pending  Lumbar puncture on 10/03/2022 and interventional radiology -Opening pressure elevated at 36 cm H2O, closing pressure 10 cm H2O -15 mL of clear, straw-colored CSF obtained before flow halted  EEG 10/02/22 Findings consistent with nonspecific encephalopathy  MRI Brain w/o C 09/14/22: 1. No acute intracranial abnormality. 2. Chronic white matter changes, most commonly due to chronic small vessel ischemia.  MRVenogram head: Negative for dural venous sinus thrombosis.  Impression   Jeremy Medina. 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 Improving exam.  Recommendations  - Continue to avoid sedating and deliriogenic medications.  - Continue amphotericin B liposome (Ambisome) 500 mg IV qd and monitor for possible continued improvement (today is day 4) - CSF histomyces antigen, blastomyces ag, cocci antibodies, MTB PCR, Karius test, serum Coccidioides antibodies by CF and ID, urine histoplasma ag, blastomyces ag. Quantiferon gold, ACE level have been ordered by ID.  - Fungal cultures are pending. Of note, fungal cultures for the above organisms are low yield. ID has asked the lab to incubate cultures for 6 weeks Initially felt to have NPH.  - Primary team to address abdominal tenderness, difficulty swallowing, and concerns for nausea. - Recommend up in chair with safety measures as able. - PT/OT as exam continues to improve. - I discussed with son the importance of delirium precautions specially while  he is in the hospital. ______________________________________________________________________   Thank you for the opportunity to take part in the care of this patient. If you have any further questions, please contact the neurology consultation attending.  Signed,  Erick Blinks [facility] Pager Number [phone]

## 2022-10-08 NOTE — Progress Notes (Signed)
Pharmacy Anti-infective Note  Jeremy Moch. is a 72 y.o. male admitted on 09/28/2022 with AMS/encephalopathy and concern for meningitis. LP done on 4/17 shows 40% CSF/serum glucose ratio, elevated protein with concern for fungal PNA. Crypto neg, additional testing sent for coccidioides, histoplasmosis, blastomyces, TB. Pharmacy has been consulted for empiric liposomal amphotericin B dosing.  The patient was noted to have AKI on admission - resolved and down to 0.97 prior to starting ampho B (2.2 on admission).   SCr remains stable today at 1.1, K 2.7 <<2.9 (s/p 70 mEq yesterday) -  will need aggressive repletion, Mg 1.8 << 1.9 s/p Mg 1g yesterday. Continue pre/post NS boluses w/ D5W flushes.   The fluid, flush, and medication schedule is below:  Sodium Chloride bolus over an hour at ~1400 Dex 5% flush at ~1500  Ampho dose due at ~1600 daily  Dex 5% flush at ~1700  Sodium Chloride over an hour at ~1700  Plan: - Continue Liposomal Amphotericin B 500 mg (~5 mg/kg) IV every 24 hours - Pre/post NS fluid boluses of 500 cc + D5W flushes before/after drug administration - Magnesium 2g IV x 1 dose - K-runs x 5 ordered + KCl 40 mEq po x 1 ordered by the [facility]-MD, will add an additional 40 mEq x 2 for this afternoon/evening (discussed with MD) - Will monitor lytes and renal function closely - daily BMET + Mg - Will follow-up on additional testing to determine duration of therapy   Height:  (177.8 cm) Weight: 102.1 kg (225 lb) IBW/kg (Calculated) : 73  Temp (24hrs), Avg:98 F (36.7 C), Min:97.5 F (36.4 C), Max:98.4 F (36.9 C)  Recent Labs  Lab 10/02/22 0459 10/03/22 0427 10/04/22 0347 10/05/22 0450 10/06/22 0438 10/07/22 0318 10/08/22 0405  WBC 11.6* 12.2* 14.5*  --   --  10.7* 9.9  CREATININE 0.93 0.93 0.97 0.99 1.06 1.11 1.10     Estimated Creatinine Clearance: 74.8 mL/min (by C-G formula based on SCr of 1.1 mg/dL).    Allergies  Allergen Reactions    Bee Venom Anaphylaxis    Antimicrobials this admission: Ampho B >>  Dose adjustments this admission:   Microbiology results: 4/12 BCx >> ngx5d 4/17 CSF cx >> ng<24h  Thank you for allowing pharmacy to be a part of this patient's care.  Georgina Pillion, PharmD, BCPS Infectious Diseases Clinical Pharmacist 10/08/2022 8:05 AM   **Pharmacist phone directory can now be found on [email or web address] ([email or web address]).  Listed under [facility].

## 2022-10-08 NOTE — Inpatient Diabetes Management (Addendum)
Inpatient Diabetes Program Recommendations  AACE/ADA: New Consensus Statement on Inpatient Glycemic Control  Target Ranges:  Prepandial:   less than 140 mg/dL      Peak postprandial:   less than 180 mg/dL (1-2 hours)      Critically ill patients:  140 - 180 mg/dL    Latest Reference Range & Units 10/07/22 08:05 10/07/22 11:54 10/07/22 16:37 10/07/22 20:50 10/08/22 07:37  Glucose-Capillary 70 - 99 mg/dL 161 (H) 096 (H) 045 (H) 145 (H) 259 (H)   Review of Glycemic Control  Diabetes history: DM2 Outpatient Diabetes medications: 70/30 0-20 units TID with meals Current orders for Inpatient glycemic control: Semglee 20 units daily, Novolog 0-9 units TID with meals, Novolog 0-5 units QHS  Inpatient Diabetes Program Recommendations:    Insulin: In reviewing chart, noted Semglee was NOT given on 10/07/22 (charted as not given, patient NPO). As a result, glucose 259 mg/dl today.   Thanks, Orlando Penner, RN, MSN, CDCES Diabetes Coordinator Inpatient Diabetes Program [phone] (Team Pager from 8am to 5pm)

## 2022-10-08 NOTE — Progress Notes (Signed)
Progress Note   Patient: Jeremy Medina. EAV:[id number] DOB: [birth year] DOA: 09/28/2022     6 DOS: the patient was seen and examined on 10/08/2022 at 11:05AM      Brief hospital course: [name]. is a 72 y.o. M with hx HTN, DM, chronic pain on methadone, PTSD/depression/anxiety on daily benzodiazepine who presented with concern for NPH.  Evidently recently admitted at [facility] for subacute encephalopathy (beginning ~Jan, 3 months PTA).  Work up there concerning for NPH, discharged to home with Neurology and Neurosurgery follow ups.  On day of admission, was seen in Neurosurgery clinic and sent to the ER for LP.  Incidentally found in the ER to have AKI Cr 2.3.    4/12: Admitted 4/14: Cr normalized, persistent encephalopathy noted 4/15: Neurology consulted 4/17: LP obtained 4/18: ID consulted, amphotericin started 4/19: Patient more alert     Assessment and Plan: * Suspected fungal meningitis    Progressive subacute encephalopathy Progressive since January.    Patient remained encephalopathic, mostly nonverbal, during first several days in hospital.   Neuraxial imaging with CT head and MRV showed no acute process.   No improvement with IV thiamine.    Neurology consulted, LP obtained that showed elevated protein and WBC.  No substantial improvement with LP, Neurology suspected fungal meningitis.   NPH was ruled out with elevated LP pressure.  (Previous mention of pseudotumor cerebrii was typo)  Amphotericin B started and now clinically improving. Crypto antigen -- negative Meningitis PCR panel -- negative Fungitell -- pending Bastomyces Antigen serum -- pending Coccidiodes Abs -- pending Quant gold -- negative MT-RIF NAA with culture TB testing from CSF -- pending Karius test -- pending Blasto IgG CSF -- pending Histo IgG CSF -- pending Blasto antigen CSF -- pending VDRL -- negative  - Continue amphotericin - Consult ID, Neuro, appreciate  involvement    Vomiting Does appear to have a history of pancreatic insufficiency, but this has been present since January or earlier, son feels like it was the early manifestations of this illness. I suspect the systemic reflections of his fungal meningitis.  I find his abdomen exam benign, doubt a primary abdominal pathology - Follow up US abdomen  Dysphagia Due to encephalopathy, SLP involved.  Hypokalemia Expected with Ampho - Supplement K per RPh  Obesity (BMI 30-39.9) BMI 32  Transaminitis Mild. - Check hepatitis serologies - Obtain US abdomen  Folate deficiency B12 deficiency ruled out - Continue folate supplement  Acute kidney injury Creatinine 2.3 on admission.  US renal unremarkable.  Improved with fluids down to baseline 0.9  Hyperlipidemia - Hold home Crestor  Diabetes mellitus type 2, controlled, without complications Glucose elevated  - Continue glargine, increase dose again - Continue sliding scale corrections  - Hold home 70/30 - Hold home Crestor  Benign essential hypertension BP normal  - Continue amlodipine - Hold Lisinopril   Anxiety - Continue home Ativan, dose minimized  Depression Not on medication at home  Chronic pain syndrome - Continue home methadone          Subjective: No significant change, no fever, no seizuers.  SOme vomiting today.     Physical Exam: BP 129/78 (BP Location: Right Arm)   Pulse 78   Temp 97.6 F (36.4 C) (Oral)   Resp 18   Ht  (1.778 m)   Wt 102.1 kg   SpO2 99%   BMI 32.28 kg/m   Thin elderly male, sitting in recliner, appears very catatonic as usual,  earlier ambulating with physical therapy, leaning to the right, very stifflegged, shuffling gait RRR, no murmurs, no peripheral edema Respiratory rate normal, lungs clear without rales or wheezes Abdomen soft, no tenderness to palpation, no distention Increased tone, marked psychomotor slowing, mild tremor, no answers  today     Data Reviewed: Discussed with infectious disease Potassium low, LFTs no change Magnesium normal White blood cell count normal now  Family Communication: Son at the bedside    Disposition: Status is: Inpatient         Author: Alberteen Sam, MD 10/08/2022 5:01 PM  For on call review [email or web address].

## 2022-10-08 NOTE — Progress Notes (Signed)
Physical Therapy Treatment Patient Details Name: Jeremy Medina. MRN: [id number] DOB: December 09, 1950 Today's Date: 10/08/2022   History of Present Illness Jeremy Medina. is a 72 y.o. male who presents complaining of altered mental status and progressive decline.  MRI performed at [facility], concern for hydrocephalus and neurosurgeon recommended pt present to ED. Work-up pending. LP 10/03/2022. PMHx: type 2 diabetes, PTSD, chronic pain on methadone per son, hypertension, hyperlipidemia    PT Comments    Pt admitted with above diagnosis. Pt was able to progress ambulation today needing +2 mod to max assist as his gait is slow with incr cues needed as well as pt shuffles feet.  Pt with heavy posterior and right lateral lean as well.  Did update frequency to 3x week as pt is showing come progress.   Pt currently with functional limitations due to balance and endurance deficits. Pt will benefit from acute skilled PT to increase their independence and safety with mobility to allow discharge.      Recommendations for follow up therapy are one component of a multi-disciplinary discharge planning process, led by the attending physician.  Recommendations may be updated based on patient status, additional functional criteria and insurance authorization.  Follow Up Recommendations  Can patient physically be transported by private vehicle: No    Assistance Recommended at Discharge Frequent or constant Supervision/Assistance  Patient can return home with the following Two people to help with walking and/or transfers;Assistance with cooking/housework;Help with stairs or ramp for entrance;Assist for transportation   Equipment Recommendations  Other (comment) (TBA)    Recommendations for Other Services       Precautions / Restrictions Precautions Precautions: Fall Precaution Comments: posterior lean Restrictions Weight Bearing Restrictions: No     Mobility  Bed Mobility Overal bed mobility: Needs  Assistance Bed Mobility: Supine to Sit Rolling: Mod assist Sidelying to sit: +2 for physical assistance, Mod assist       General bed mobility comments: Pt required Mod A at bil LE  and trunk to initiate movement with multiple multi modal cues.    Transfers Overall transfer level: Needs assistance Equipment used: Rolling walker (2 wheels) Transfers: Sit to/from Stand Sit to Stand: Mod assist, +2 physical assistance, From elevated surface           General transfer comment: Pt was Mod A for sit to stand with rocking and counting to get to standing as well as cues and assist for hand placement.  Pt demonstrates posterior lean that requires Mod A to maintain upright balance.  Pt also with right lateral lean.    Ambulation/Gait Ambulation/Gait assistance: Max assist, +2 safety/equipment Gait Distance (Feet): 25 Feet Assistive device: Rolling walker (2 wheels) Gait Pattern/deviations: Shuffle, Trunk flexed, Drifts right/left, Staggering right, Staggering left, Leaning posteriorly Gait velocity: reduced Gait velocity interpretation: <1.31 ft/sec, indicative of household ambulator   General Gait Details: pt with short shuffling steps, minimal to no foot clearance noted. PT initially assisting with weight shift at hips to aide in initiation of stepping. Pt appears uncomfortable with this method. PT then assists by pushing RW in desired direction, pt tends to follow with short shuffling steps.  If pt stops, has difficulty continuing steps again needing incr cuing verbally and tactilly.   Stairs             Wheelchair Mobility    Modified Rankin (Stroke Patients Only)       Balance Overall balance assessment: Needs assistance Sitting-balance support: Feet supported, Bilateral upper  extremity supported Sitting balance-Leahy Scale: Poor Sitting balance - Comments: posterior lean intermittently and with increased sitting without back support at EOB. When asked to lean forward  pt would state " I am" as he was leaning posteriorly Postural control: Posterior lean Standing balance support: Bilateral upper extremity supported, Reliant on assistive device for balance Standing balance-Leahy Scale: Poor Standing balance comment: Mod A  of 2 to maintain upright balance with RW                            Cognition Arousal/Alertness: Awake/alert Behavior During Therapy: Flat affect Overall Cognitive Status: Difficult to assess Area of Impairment: Orientation, Attention, Memory, Following commands, Safety/judgement, Awareness, Problem solving                 Orientation Level: Disoriented to, Time, Situation, Place Current Attention Level: Focused   Following Commands: Follows one step commands inconsistently, Follows one step commands with increased time Safety/Judgement: Decreased awareness of safety, Decreased awareness of deficits Awareness: Intellectual Problem Solving: Slow processing, Decreased initiation, Difficulty sequencing, Requires verbal cues General Comments: Pt intermittently responded appropriately and in 1-2 words. Mostly grunting and agreeing.  Inconsistent with responses and delayed response. Unsure if pt understands all commands.        Exercises General Exercises - Lower Extremity Ankle Circles/Pumps: AROM, Both, 10 reps, Seated Long Arc Quad: AROM, Both, 10 reps, Seated Hip Flexion/Marching: AAROM, Both, 5 reps, Seated    General Comments General comments (skin integrity, edema, etc.): Son present and very attentive to pt      Pertinent Vitals/Pain Pain Assessment Pain Assessment: No/denies pain Breathing: normal Negative Vocalization: none Facial Expression: smiling or inexpressive Body Language: relaxed Consolability: no need to console PAINAD Score: 0    Home Living                          Prior Function            PT Goals (current goals can now be found in the care plan section) Progress  towards PT goals: Progressing toward goals    Frequency    Min 3X/week      PT Plan Current plan remains appropriate;Frequency needs to be updated    Co-evaluation              AM-PAC PT "6 Clicks" Mobility   Outcome Measure  Help needed turning from your back to your side while in a flat bed without using bedrails?: A Lot Help needed moving from lying on your back to sitting on the side of a flat bed without using bedrails?: A Lot Help needed moving to and from a bed to a chair (including a wheelchair)?: A Lot Help needed standing up from a chair using your arms (e.g., wheelchair or bedside chair)?: A Lot Help needed to walk in hospital room?: Total Help needed climbing 3-5 steps with a railing? : Total 6 Click Score: 10    End of Session Equipment Utilized During Treatment: Gait belt Activity Tolerance: Patient tolerated treatment well Patient left: in chair;with chair alarm set;with call bell/phone within reach;with family/visitor present Nurse Communication: Mobility status PT Visit Diagnosis: Unsteadiness on feet (R26.81);Muscle weakness (generalized) (M62.81)     Time: 9811-9147 PT Time Calculation (min) (ACUTE ONLY): 32 min  Charges:  $Gait Training: 8-22 mins $Therapeutic Exercise: 8-22 mins  [facility] M,PT Acute Rehab Services [phone]    Bevelyn Buckles 10/08/2022, 12:46 PM

## 2022-10-08 NOTE — Assessment & Plan Note (Addendum)
Does appear to have a history of pancreatic insufficiency, but this has been present since January or earlier, son feels like it was the early manifestations of this illness. I suspect the systemic reflections of his fungal meningitis.  I find his abdomen exam benign, doubt a primary abdominal pathology - Follow up US abdomen

## 2022-10-08 NOTE — Progress Notes (Signed)
[facility]  Date of Admission:  09/28/2022           Reason for visit: Follow up on suspected fungal meningitis  Current antibiotics: Amphotericin 10/04/2022--present  ASSESSMENT:    72 y.o. male admitted with:  Suspected fungal meningitis: Patient presenting with progressive neurologic decline since January 2024.  Status post lumbar puncture 10/03/2022 with elevated opening pressure of 36 and CSF pleocytosis of 60 WBCs (87% monocytes).  Also noted to have elevated protein and glucose of 67.  His CSF cultures at this time are unremarkable with several outstanding fungal serologies pending.  Suspicion per prior notes is that this is representative of a subacute or chronic fungal meningitis.  He seems relatively improved on Amphotericin since it was initiated. Progressive subacute encephalopathy: Suspected due to #1.  RECOMMENDATIONS:    Continue Amphotericin per pharmacy Follow-up pending fungal labs Consider repeat LP at the end of this week Will follow   Principal Problem:   Suspected fungal meningitis Active Problems:   Chronic pain syndrome   Depression   Anxiety   Benign essential hypertension   Diabetes mellitus type 2, controlled, without complications   Hyperlipidemia   Acute kidney injury   Progressive subacute encephalopathy   Folate deficiency   Transaminitis   Obesity (BMI 30-39.9)   Hypokalemia   Dysphagia    MEDICATIONS:    Scheduled Meds:  amLODipine  10 mg Oral Daily   dextrose  10 mL Intravenous Q24H   dextrose  10 mL Intravenous Q24H   folic acid  1 mg Oral Daily   insulin aspart  0-15 Units Subcutaneous TID WC   insulin aspart  0-5 Units Subcutaneous QHS   insulin glargine-yfgn  20 Units Subcutaneous Daily   lipase/protease/amylase  36,000 Units Oral TID AC   LORazepam  0.5 mg Oral QHS   methadone  10 mg Oral TID   potassium chloride  40 mEq Oral Q6H   sodium chloride  500 mL Intravenous Q24H   sodium  chloride  500 mL Intravenous Q24H   sodium chloride flush  3 mL Intravenous Q12H   Continuous Infusions:  amphotericin B liposome (AMBISOME) 500 mg in dextrose 5 % 500 mL IVPB 312.5 mL/hr at 10/07/22 1811   potassium chloride 10 mEq (10/08/22 1040)   PRN Meds:.acetaminophen, acetaminophen, diphenhydrAMINE **OR** diphenhydrAMINE, meperidine (DEMEROL) injection, ondansetron **OR** ondansetron (ZOFRAN) IV, senna-docusate  SUBJECTIVE:   24 hour events:  No acute events noted   Patient seen this morning.  He is lying in bed.  His son is also present and reports some improvement.  Review of Systems  Unable to perform ROS: Mental status change      OBJECTIVE:   Blood pressure (!) 153/92, pulse 84, temperature 98.4 F (36.9 C), temperature source Oral, resp. rate 18, height  (1.778 m), weight 102.1 kg, SpO2 98 %. Body mass index is 32.28 kg/m.  Physical Exam Constitutional:      Comments: Chronically ill-appearing man, lying in bed, no acute distress.  HENT:     Head: Normocephalic and atraumatic.  Eyes:     Extraocular Movements: Extraocular movements intact.     Conjunctiva/sclera: Conjunctivae normal.  Cardiovascular:     Rate and Rhythm: Normal rate and regular rhythm.  Pulmonary:     Effort: Pulmonary effort is normal. No respiratory distress.  Abdominal:     General: There is no distension.     Palpations: Abdomen is soft.  Musculoskeletal:  Cervical back: Normal range of motion and neck supple.     Right lower leg: No edema.     Left lower leg: No edema.  Skin:    General: Skin is warm and dry.  Neurological:     General: No focal deficit present.  Psychiatric:        Mood and Affect: Mood normal.        Behavior: Behavior normal.      Lab Results: Lab Results  Component Value Date   WBC 9.9 10/08/2022   HGB 12.9 (L) 10/08/2022   HCT 38.5 (L) 10/08/2022   MCV 82.8 10/08/2022   PLT 188 10/08/2022    Lab Results  Component Value Date   NA  137 10/08/2022   K 2.7 (LL) 10/08/2022   CO2 24 10/08/2022   GLUCOSE 263 (H) 10/08/2022   BUN 20 10/08/2022   CREATININE 1.10 10/08/2022   CALCIUM 9.2 10/08/2022   GFRNONAA >60 10/08/2022   GFRAA >60 10/11/2015    Lab Results  Component Value Date   ALT 58 (H) 10/08/2022   AST 48 (H) 10/08/2022   ALKPHOS 71 10/08/2022   BILITOT 0.6 10/08/2022    No results found for: "CRP"  No results found for: "ESRSEDRATE"   I have reviewed the micro and lab results in Epic.  Imaging: No results found.   Imaging independently reviewed in Epic.    Vedia Coffer for Infectious Disease [facility] [phone] pager 10/08/2022, 12:29 PM  I have personally spent 50 minutes involved in face-to-face and non-face-to-face activities for this patient on the day of the visit. Professional time spent includes the following activities: Preparing to see the patient (review of tests), Obtaining and/or reviewing separately obtained history (admission/discharge record), Performing a medically appropriate examination and/or evaluation , Ordering medications/tests/procedures, referring and communicating with other health care professionals, Documenting clinical information in the EMR, Independently interpreting results (not separately reported), Communicating results to the patient/family/caregiver, Counseling and educating the patient/family/caregiver and Care coordination (not separately reported).

## 2022-10-09 ENCOUNTER — Inpatient Hospital Stay (HOSPITAL_COMMUNITY): Payer: No Typology Code available for payment source

## 2022-10-09 DIAGNOSIS — G9340 Encephalopathy, unspecified: Secondary | ICD-10-CM | POA: Diagnosis not present

## 2022-10-09 DIAGNOSIS — E785 Hyperlipidemia, unspecified: Secondary | ICD-10-CM | POA: Diagnosis not present

## 2022-10-09 DIAGNOSIS — E119 Type 2 diabetes mellitus without complications: Secondary | ICD-10-CM | POA: Diagnosis not present

## 2022-10-09 DIAGNOSIS — G030 Nonpyogenic meningitis: Secondary | ICD-10-CM | POA: Diagnosis not present

## 2022-10-09 LAB — CBC
HCT: 38.8 % — ABNORMAL LOW (ref 39.0–52.0)
Hemoglobin: 13.9 g/dL (ref 13.0–17.0)
MCH: 28.8 pg (ref 26.0–34.0)
MCHC: 35.8 g/dL (ref 30.0–36.0)
MCV: 80.3 fL (ref 80.0–100.0)
Platelets: 213 10*3/uL (ref 150–400)
RBC: 4.83 MIL/uL (ref 4.22–5.81)
RDW: 13.8 % (ref 11.5–15.5)
WBC: 11.6 10*3/uL — ABNORMAL HIGH (ref 4.0–10.5)
nRBC: 0 % (ref 0.0–0.2)

## 2022-10-09 LAB — GLUCOSE, CAPILLARY
Glucose-Capillary: 244 mg/dL — ABNORMAL HIGH (ref 70–99)
Glucose-Capillary: 316 mg/dL — ABNORMAL HIGH (ref 70–99)
Glucose-Capillary: 252 mg/dL — ABNORMAL HIGH (ref 70–99)
Glucose-Capillary: 219 mg/dL — ABNORMAL HIGH (ref 70–99)
Glucose-Capillary: 172 mg/dL — ABNORMAL HIGH (ref 70–99)
Glucose-Capillary: 237 mg/dL — ABNORMAL HIGH (ref 70–99)

## 2022-10-09 LAB — BASIC METABOLIC PANEL
Anion gap: 11 (ref 5–15)
BUN: 16 mg/dL (ref 8–23)
CO2: 23 mmol/L (ref 22–32)
Calcium: 9.5 mg/dL (ref 8.9–10.3)
Chloride: 105 mmol/L (ref 98–111)
Creatinine, Ser: 0.99 mg/dL (ref 0.61–1.24)
GFR, Estimated: >60 mL/min (ref >60)
Glucose, Bld: 255 mg/dL — ABNORMAL HIGH (ref 70–99)
Potassium: 3.1 mmol/L — ABNORMAL LOW (ref 3.5–5.1)
Sodium: 139 mmol/L (ref 135–145)

## 2022-10-09 LAB — HEPATITIS PANEL, ACUTE
HCV Ab: NONREACTIVE
Hep A IgM: NONREACTIVE
Hep B C IgM: NONREACTIVE
Hepatitis B Surface Ag: NONREACTIVE

## 2022-10-09 LAB — HEPATIC FUNCTION PANEL
ALT: 56 U/L — ABNORMAL HIGH (ref 0–44)
AST: 36 U/L (ref 15–41)
Albumin: 3 g/dL — ABNORMAL LOW (ref 3.5–5.0)
Alkaline Phosphatase: 81 U/L (ref 38–126)
Bilirubin, Direct: 0.5 mg/dL — ABNORMAL HIGH (ref 0.0–0.2)
Indirect Bilirubin: 0.4 mg/dL (ref 0.3–0.9)
Total Bilirubin: 0.9 mg/dL (ref 0.3–1.2)
Total Protein: 6.3 g/dL — ABNORMAL LOW (ref 6.5–8.1)

## 2022-10-09 LAB — LIPASE, BLOOD: Lipase: 22 U/L (ref 11–51)

## 2022-10-09 LAB — MAGNESIUM: Magnesium: 1.8 mg/dL (ref 1.7–2.4)

## 2022-10-09 MED ORDER — MAGNESIUM SULFATE 4 GM/100ML IV SOLN
4.0000 g | Freq: Once | INTRAVENOUS | Status: AC
  Administered 2022-10-09: 4 g via INTRAVENOUS
  Filled 2022-10-09: qty 100

## 2022-10-09 MED ORDER — LISINOPRIL 10 MG PO TABS
5.0000 mg | ORAL_TABLET | Freq: Every day | ORAL | Status: DC
  Administered 2022-10-09 – 2022-10-10 (×2): 5 mg via ORAL
  Filled 2022-10-09 (×2): qty 1

## 2022-10-09 MED ORDER — POTASSIUM CHLORIDE 20 MEQ PO PACK
40.0000 meq | PACK | Freq: Three times a day (TID) | ORAL | Status: DC
  Administered 2022-10-09 – 2022-10-10 (×4): 40 meq via ORAL
  Filled 2022-10-09 (×4): qty 2

## 2022-10-09 NOTE — Progress Notes (Signed)
[facility]  Date of Admission:  09/28/2022           Reason for visit: Follow up on suspected fungal meningitis  Current antibiotics: Amphotericin 10/04/22--present    ASSESSMENT:    72 y.o. 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 Also noted to have elevated protein and glucose of 67. His CSF cultures at this time are unremarkable with several outstanding fungal serologies pending. Suspicion per prior notes is that this is representative of a subacute or chronic fungal meningitis.  He continues to overall improved on antifungal therapy since its initiation last week vs related to relief of substantial intracranial pressure. Progressive subacute encephalopathy: Suspected secondary to #1.  RECOMMENDATIONS:    Continue amphotericin Follow up fungal send out labs Follow up Fungitell Consider repeat LP at the end of this week Following   Principal Problem:   Suspected fungal meningitis Active Problems:   Chronic pain syndrome   Depression   Anxiety   Benign essential hypertension   Diabetes mellitus type 2, controlled, without complications   Hyperlipidemia   Acute kidney injury   Progressive subacute encephalopathy   Folate deficiency   Transaminitis   Obesity (BMI 30-39.9)   Hypokalemia   Dysphagia   Vomiting    MEDICATIONS:    Scheduled Meds:  amLODipine  10 mg Oral Daily   dextrose  10 mL Intravenous Q24H   dextrose  10 mL Intravenous Q24H   folic acid  1 mg Oral Daily   insulin aspart  0-15 Units Subcutaneous TID WC   insulin aspart  0-5 Units Subcutaneous QHS   insulin glargine-yfgn  24 Units Subcutaneous Daily   lipase/protease/amylase  36,000 Units Oral TID AC   LORazepam  0.5 mg Oral QHS    methadone  10 mg Oral TID   sodium chloride  500 mL Intravenous Q24H   sodium chloride  500 mL Intravenous Q24H   sodium chloride flush  3 mL Intravenous Q12H   Continuous Infusions:  amphotericin B liposome (AMBISOME) 500 mg in dextrose 5 % 500 mL IVPB 312.5 mL/hr at 10/08/22 1648   PRN Meds:.acetaminophen, acetaminophen, diphenhydrAMINE **OR** diphenhydrAMINE, meperidine (DEMEROL) injection, ondansetron **OR** ondansetron (ZOFRAN) IV, senna-docusate  SUBJECTIVE:   24 hour events:  No acute events noted overnight Afebrile Fungal send out labs pending Routine cultures remain no growth  Patient reports he is doing okay.  No new complaints.   Review of Systems  All other systems reviewed and are negative.     OBJECTIVE:   Blood pressure (!) 162/86, pulse 88, temperature 97.8 F (36.6 C), temperature source Oral, resp. rate 19, height  (1.778 m), weight 102.1 kg, SpO2 100 %. Body mass index is 32.28 kg/m.  Physical Exam Constitutional:      Comments: He is sitting up in the chair, watching TV.  He is more engaged than yesterday.  Able to answer simple questions and follow commands.  His movement is slow but purposeful.   HENT:     Head: Normocephalic and atraumatic.  Eyes:     Extraocular Movements: Extraocular movements intact.     Conjunctiva/sclera: Conjunctivae normal.  Pulmonary:     Effort: Pulmonary effort is normal. No respiratory distress.  Abdominal:     General: There is no distension.  Palpations: Abdomen is soft.  Musculoskeletal:     Cervical back: Normal range of motion and neck supple.     Right lower leg: No edema.     Left lower leg: No edema.  Skin:    General: Skin is warm and dry.     Findings: No rash.      Lab Results: Lab Results  Component Value Date   WBC 11.6 (H) 10/09/2022   HGB 13.9 10/09/2022   HCT 38.8 (L) 10/09/2022   MCV 80.3 10/09/2022   PLT 213 10/09/2022    Lab Results  Component Value Date   NA 139  10/09/2022   K 3.1 (L) 10/09/2022   CO2 23 10/09/2022   GLUCOSE 255 (H) 10/09/2022   BUN 16 10/09/2022   CREATININE 0.99 10/09/2022   CALCIUM 9.5 10/09/2022   GFRNONAA >60 10/09/2022   GFRAA >60 10/11/2015    Lab Results  Component Value Date   ALT 56 (H) 10/09/2022   AST 36 10/09/2022   ALKPHOS 81 10/09/2022   BILITOT 0.9 10/09/2022    No results found for: "CRP"  No results found for: "ESRSEDRATE"   I have reviewed the micro and lab results in Epic.  Imaging: No results found.   Imaging independently reviewed in Epic.    Vedia Coffer for Infectious Disease [facility] [phone] pager 10/09/2022, 7:45 AM  I have personally spent 50 minutes involved in face-to-face and non-face-to-face activities for this patient on the day of the visit. Professional time spent includes the following activities: Preparing to see the patient (review of tests), Obtaining and/or reviewing separately obtained history (admission/discharge record), Performing a medically appropriate examination and/or evaluation , Ordering medications/tests/procedures, referring and communicating with other health care professionals, Documenting clinical information in the EMR, Independently interpreting results (not separately reported), Communicating results to the patient/family/caregiver, Counseling and educating the patient/family/caregiver and Care coordination (not separately reported).

## 2022-10-09 NOTE — Progress Notes (Signed)
Pharmacy Anti-infective Note  Jeremy Medina. is a 72 y.o. male admitted on 09/28/2022 with AMS/encephalopathy and concern for meningitis. LP done on 4/17 shows 40% CSF/serum glucose ratio, elevated protein with concern for fungal PNA. Crypto neg, additional testing sent for coccidioides, histoplasmosis, blastomyces, TB. Pharmacy has been consulted for empiric liposomal amphotericin B dosing.  The patient was noted to have AKI on admission - resolved and down to 0.97 prior to starting ampho B (2.2 on admission).   SCr remains stable today at 0.99 << 1.1, K 3.1 << 2.7 (s/p 170 mEq yesterday) -  will need aggressive repletion, Mg 1.8 << 1.8 s/p Mg 2g yesterday. Continue pre/post NS boluses w/ D5W flushes.   The fluid, flush, and medication schedule is below:  Sodium Chloride bolus over an hour at ~1400 Dex 5% flush at ~1500  Ampho dose due at ~1600 daily  Dex 5% flush at ~1700  Sodium Chloride over an hour at ~1700  Plan: - Continue Liposomal Amphotericin B 500 mg (~5 mg/kg) IV every 24 hours - Pre/post NS fluid boluses of 500 cc + D5W flushes before/after drug administration - Magnesium 4g IV x 1 dose - Schedule KCl 40 mEq packets (dysphagia diet) tid for now - will monitor for need to adjust and/or replete more - Will monitor lytes and renal function closely - daily BMET + Mg - Will follow-up on additional testing to determine duration of therapy   Height:  (177.8 cm) Weight: 102.1 kg (225 lb) IBW/kg (Calculated) : 73  Temp (24hrs), Avg:97.6 F (36.4 C), Min:97.4 F (36.3 C), Max:97.8 F (36.6 C)  Recent Labs  Lab 10/03/22 0427 10/04/22 0347 10/05/22 0450 10/06/22 0438 10/07/22 0318 10/08/22 0405 10/09/22 0440  WBC 12.2* 14.5*  --   --  10.7* 9.9 11.6*  CREATININE 0.93 0.97 0.99 1.06 1.11 1.10 0.99     Estimated Creatinine Clearance: 83.1 mL/min (by C-G formula based on SCr of 0.99 mg/dL).    Allergies  Allergen Reactions   Bee Venom  Anaphylaxis    Antimicrobials this admission: Ampho B >>  Dose adjustments this admission:   Microbiology results: 4/12 BCx >> ngx5d 4/17 CSF cx >> ng<24h  Thank you for allowing pharmacy to be a part of this patient's care.  Georgina Pillion, PharmD, BCPS Infectious Diseases Clinical Pharmacist 10/09/2022 7:50 AM   **Pharmacist phone directory can now be found on [email or web address] ([email or web address]).  Listed under [facility].

## 2022-10-09 NOTE — Progress Notes (Signed)
Occupational Therapy Treatment Patient Details Name: Jeremy Medina. MRN: [id number] DOB: September 25, 1950 Today's Date: 10/09/2022   History of present illness Nilay Medina. is a 72 y.o. male who presents complaining of altered mental status and progressive decline.  MRI performed at [facility], concern for hydrocephalus and neurosurgeon recommended pt present to ED. Work-up pending. LP 10/03/2022. PMHx: type 2 diabetes, PTSD, chronic pain on methadone per son, hypertension, hyperlipidemia   OT comments  Patient laying in bed with feet off the side upon entry. Patient instructed to sit on EOB and stated, "you will have to help me." Patient with limited vocalization during remainder of visit. Patient was mod assist to stand and transfer to recliner with assistance to initiate standing and turning. Grooming tasks performed at sink while standing and seated with tactile and verbal cues to perform. Static standing performed standing at sink and unable to follow commands for balance tasks. Patient is demonstrating progress with OT treatment and has potential for continued gains with further OT intervention. Acute OT to continue to follow.    Recommendations for follow up therapy are one component of a multi-disciplinary discharge planning process, led by the attending physician.  Recommendations may be updated based on patient status, additional functional criteria and insurance authorization.    Assistance Recommended at Discharge Frequent or constant Supervision/Assistance  Patient can return home with the following  A lot of help with walking and/or transfers;Two people to help with walking and/or transfers;A lot of help with bathing/dressing/bathroom;Two people to help with bathing/dressing/bathroom;Direct supervision/assist for medications management;Direct supervision/assist for financial management;Assist for transportation;Help with stairs or ramp for entrance;Assistance with feeding;Assistance with  cooking/housework   Equipment Recommendations  None recommended by OT    Recommendations for Other Services      Precautions / Restrictions Precautions Precautions: Fall Precaution Comments: posterior lean Restrictions Weight Bearing Restrictions: No       Mobility Bed Mobility Overal bed mobility: Needs Assistance Bed Mobility: Supine to Sit     Supine to sit: Mod assist     General bed mobility comments: Patient stating, "you will have to help me" when instructed to get to EOB. Required assistance with trunk and scooting to EOB, legs hanging off bed upon entry.    Transfers Overall transfer level: Needs assistance Equipment used: Rolling walker (2 wheels) Transfers: Sit to/from Stand, Bed to chair/wheelchair/BSC Sit to Stand: Mod assist     Step pivot transfers: Mod assist     General transfer comment: required tactile cues to initate sit to stand and turning towards recliner     Balance Overall balance assessment: Needs assistance Sitting-balance support: Feet supported, Bilateral upper extremity supported Sitting balance-Leahy Scale: Poor Sitting balance - Comments: posterior leaning seated on EOB, able to assist with sitting balance using RW for support Postural control: Posterior lean Standing balance support: Bilateral upper extremity supported, Reliant on assistive device for balance Standing balance-Leahy Scale: Poor Standing balance comment: reliant on RW or sink for support when standing                           ADL either performed or assessed with clinical judgement   ADL Overall ADL's : Needs assistance/impaired     Grooming: Wash/dry hands;Wash/dry face;Oral care;Minimal assistance;Standing Grooming Details (indicate cue type and reason): min assist to intiate grooming tasks. performed standing with min assist for balance  General ADL Comments: required physical assistance to initate  tasks    Extremity/Trunk Assessment              Vision       Perception     Praxis      Cognition Arousal/Alertness: Awake/alert Behavior During Therapy: Flat affect Overall Cognitive Status: Difficult to assess Area of Impairment: Orientation, Attention, Memory, Following commands, Safety/judgement, Awareness, Problem solving                 Orientation Level: Disoriented to, Time, Situation, Place Current Attention Level: Focused   Following Commands: Follows one step commands inconsistently, Follows one step commands with increased time Safety/Judgement: Decreased awareness of safety, Decreased awareness of deficits Awareness: Intellectual Problem Solving: Slow processing, Decreased initiation, Difficulty sequencing, Requires verbal cues General Comments: little vocalization, but appropriate, followed directions <25% of time with verbal and tactile cues        Exercises      Shoulder Instructions       General Comments      Pertinent Vitals/ Pain       Pain Assessment Pain Assessment: Faces Faces Pain Scale: No hurt Pain Intervention(s): Monitored during session  Home Living                                          Prior Functioning/Environment              Frequency  Min 1X/week        Progress Toward Goals  OT Goals(current goals can now be found in the care plan section)  Progress towards OT goals: Progressing toward goals  Acute Rehab OT Goals OT Goal Formulation: Patient unable to participate in goal setting Time For Goal Achievement: 09/29/22 Potential to Achieve Goals: Good ADL Goals Pt Will Perform Grooming: with mod assist;sitting Pt Will Perform Upper Body Dressing: with mod assist;sitting Pt Will Transfer to Toilet: with mod assist;bedside commode;stand pivot transfer Additional ADL Goal #1: Pt will follow simple 1 step commands 50% of the time to complete ADL task  Plan Discharge plan remains  appropriate    Co-evaluation                 AM-PAC OT "6 Clicks" Daily Activity     Outcome Measure   Help from another person eating meals?: Total Help from another person taking care of personal grooming?: A Lot Help from another person toileting, which includes using toliet, bedpan, or urinal?: A Lot Help from another person bathing (including washing, rinsing, drying)?: A Lot Help from another person to put on and taking off regular upper body clothing?: A Lot Help from another person to put on and taking off regular lower body clothing?: A Lot 6 Click Score: 11    End of Session Equipment Utilized During Treatment: Gait belt;Rolling walker (2 wheels)  OT Visit Diagnosis: Unsteadiness on feet (R26.81);Other abnormalities of gait and mobility (R26.89);Muscle weakness (generalized) (M62.81);Pain   Activity Tolerance Patient tolerated treatment well   Patient Left in chair;with call bell/phone within reach;with chair alarm set   Nurse Communication Mobility status        Time: 1610-9604 OT Time Calculation (min): 34 min  Charges: OT General Charges $OT Visit: 1 Visit OT Treatments $Self Care/Home Management : 8-22 mins $Therapeutic Activity: 8-22 mins  Alfonse Flavors, OTA Acute Rehabilitation Services  Office [phone]   Sarissa Dern  Jeannett Senior 10/09/2022, 12:45 PM

## 2022-10-09 NOTE — Progress Notes (Addendum)
Progress Note   Patient: Jeremy Medina. ZOX:[id number] DOB: April 28, 1951 DOA: 09/28/2022     7 DOS: the patient was seen and examined on 10/09/2022 at 9:15AM      Brief hospital course: Mr. Tyre Beaver. is a 72 y.o. M with hx HTN, DM, chronic pain on methadone, PTSD/depression/anxiety on daily benzodiazepine who presented with concern for NPH.  Evidently recently admitted at [facility] for subacute encephalopathy (beginning ~Jan, 3 months PTA).  Work up there concerning for NPH, discharged to home with Neurology and Neurosurgery follow ups.  On day of admission, was seen in Neurosurgery clinic and sent to the ER for LP.  Incidentally found in the ER to have AKI Cr 2.3.    4/12: Admitted 4/14: Cr normalized, persistent encephalopathy noted 4/15: Neurology consulted 4/17: LP obtained 4/18: ID consulted, amphotericin started 4/19: Patient more alert 4/20-22: Not much clinical change     Assessment and Plan: * Suspected fungal meningitis Progressive subacute encephalopathy Progressive since January.    Patient remained encephalopathic, mostly nonverbal, during first several days in hospital.   Neuraxial imaging with CT head and MRV showed no acute process.   No improvement with IV thiamine.    Neurology consulted, LP obtained that showed elevated protein and WBC.  No substantial improvement with LP, Neurology suspected fungal meningitis.   NPH was ruled out with elevated LP pressure.  (Previous mention of pseudotumor cerebrii was typo)  Amphotericin B started and some initial clinical improvement, somewhat stalled at this point.  Cytology of CSF -- no malignant cells Crypto antigen -- negative Meningitis PCR panel -- negative Fungitell -- pending Bastomyces Antigen serum -- pending Coccidiodes Abs -- pending Quant gold -- negative MT-RIF NAA with culture TB testing from CSF -- pending Karius test -- pending Blasto IgG CSF -- pending Histo IgG CSF -- pending Blasto antigen  CSF -- pending VDRL -- negative  - Continue amphotericin - Consult ID, appreciate involvement - Neuro suspect infectious meningitis, have signed off - Possible LP at end of week - Continue PT OT      Vomiting Does appear to have a history of pancreatic insufficiency, but this has been present since January or earlier, son feels like it was the early manifestations of this illness. I suspect the systemic reflections of his fungal meningitis.  I find his abdomen exam benign, doubt a primary abdominal pathology - Follow up US abdomen  ADDENDUM: US abdomen unremarkable, some fatty liver, presumably NASH.     Transaminitis Mild.  Hepatitis serologies negative.  Probably NASH given normal [place]. - Obtain US abdomen       Diabetes mellitus type 2, controlled, without complications Glucose elevated  - Continue glargine - Continue sliding scale corrections, increase dose - Hold home 70/30 - Hold home Crestor  Benign essential hypertension BP elevated  - Continue amlodipine - Resume home Lisinopril   Chronic pain syndrome - Continue home methadone      Hypokalemia Expected with Ampho - Supplement K per RPh  Obesity (BMI 30-39.9) BMI 32   Folate deficiency B12 deficiency ruled out - Continue folate supplement  Acute kidney injury Creatinine 2.3 on admission.  US renal unremarkable.  Improved with fluids down to baseline 0.9  Hyperlipidemia - Hold home Crestor   Anxiety - Continue home Ativan, dose minimized  Depression Not on medication at home        Subjective: No new complaints, no nursing concerns.  Patient denies pain, although his answers are somewhat unreliable.  Physical Exam: BP (!) 162/86 (BP Location: Left Arm)   Pulse 88   Temp 97.8 F (36.6 C) (Oral)   Resp 19   Ht  (1.778 m)   Wt 102.1 kg   SpO2 100%   BMI 32.28 kg/m   Adult male, standing at the sink with occupational therapy, appears to have psychomotor  slowing, mild tremor, overall stiffened with increased tone diffusely RRR, no murmurs, no peripheral edema Respiratory rate normal, lungs clear without rales or wheezes Abdomen soft without tenderness palpation or guarding, no ascites or distention Patient makes eye contact, but mostly inattentive, severe psychomotor slowing, answers a few questions, but does not answer others, able to follow some commands with OT like combing his hair, but psychomotor slowing is marked    Data Reviewed: Discussed with neurology Basic metabolic panel shows hypokalemia, transaminitis Still has some hyperglycemia  Family Communication: Son not present today    Disposition: Status is: Inpatient The patient presented with subacute encephalopathy  Consensus at this point is that this is most likely a fungal meningitis although considerable amount of tests are not resulted yet and this is still working diagnosis  ID were consulted, neurology have signed off  Will plan for LP later this week, continue Amphotericin, and follow-up testing sent out on previous LP.  Once infectious disease evaluated can be common about the diagnosis and concluded treatment with Amphotericin, we can begin to think about discharge to skilled nursing facility, likely the patient will have significant needs and be dependent on discharge (was independent prior to admission, lived with son)        Author: Alberteen Sam, MD 10/09/2022 1:15 PM  For on call review [email or web address].

## 2022-10-09 NOTE — TOC Progression Note (Signed)
Transition of Care (TOC) - Progression Note    Patient Details  Name: Jeremy Medina. MRN: [id number] Date of Birth: 1950/11/14  Transition of Care [facility]) CM/SW Contact  Xachary Hambly A [place], [place] Phone Number: 10/09/2022, 5:08 PM  Clinical Narrative:    TOC contacted Charlcie Cradle at [facility] to update regarding pt progress. CSW informed that pt bed is still available at [facility]. Pt not medically ready for discharge.   TOC will continue to follow.    Expected Discharge Plan: Skilled Nursing Facility Barriers to Discharge: Continued Medical Work up  Expected Discharge Plan and Services In-house Referral: Clinical Social Work     Living arrangements for the past 2 months: Single Family Home                                       Social Determinants of Health (SDOH) Interventions SDOH Screenings   Food Insecurity: Patient Unable To Answer (09/29/2022)  Transportation Needs: Patient Unable To Answer (09/29/2022)  Utilities: Patient Unable To Answer (09/29/2022)  Depression (PHQ2-9): Low Risk  (04/27/2019)  Tobacco Use: High Risk (09/29/2022)    Readmission Risk Interventions     No data to display

## 2022-10-10 DIAGNOSIS — G030 Nonpyogenic meningitis: Secondary | ICD-10-CM | POA: Diagnosis not present

## 2022-10-10 LAB — COMPREHENSIVE METABOLIC PANEL
ALT: 57 U/L — ABNORMAL HIGH (ref 0–44)
AST: 35 U/L (ref 15–41)
Albumin: 2.8 g/dL — ABNORMAL LOW (ref 3.5–5.0)
Alkaline Phosphatase: 75 U/L (ref 38–126)
Anion gap: 9 (ref 5–15)
BUN: 22 mg/dL (ref 8–23)
CO2: 24 mmol/L (ref 22–32)
Calcium: 9.1 mg/dL (ref 8.9–10.3)
Chloride: 103 mmol/L (ref 98–111)
Creatinine, Ser: 1.31 mg/dL — ABNORMAL HIGH (ref 0.61–1.24)
GFR, Estimated: 59 mL/min — ABNORMAL LOW (ref >60)
Glucose, Bld: 280 mg/dL — ABNORMAL HIGH (ref 70–99)
Potassium: 3.2 mmol/L — ABNORMAL LOW (ref 3.5–5.1)
Sodium: 136 mmol/L (ref 135–145)
Total Bilirubin: 0.7 mg/dL (ref 0.3–1.2)
Total Protein: 6 g/dL — ABNORMAL LOW (ref 6.5–8.1)

## 2022-10-10 LAB — CBC
HCT: 36.7 % — ABNORMAL LOW (ref 39.0–52.0)
Hemoglobin: 13.1 g/dL (ref 13.0–17.0)
MCH: 28.7 pg (ref 26.0–34.0)
MCHC: 35.7 g/dL (ref 30.0–36.0)
MCV: 80.5 fL (ref 80.0–100.0)
Platelets: 221 10*3/uL (ref 150–400)
RBC: 4.56 MIL/uL (ref 4.22–5.81)
RDW: 14.1 % (ref 11.5–15.5)
WBC: 13.7 10*3/uL — ABNORMAL HIGH (ref 4.0–10.5)
nRBC: 0 % (ref 0.0–0.2)

## 2022-10-10 LAB — GLUCOSE, CAPILLARY
Glucose-Capillary: 237 mg/dL — ABNORMAL HIGH (ref 70–99)
Glucose-Capillary: 216 mg/dL — ABNORMAL HIGH (ref 70–99)
Glucose-Capillary: 213 mg/dL — ABNORMAL HIGH (ref 70–99)
Glucose-Capillary: 197 mg/dL — ABNORMAL HIGH (ref 70–99)

## 2022-10-10 LAB — MAGNESIUM: Magnesium: 2.1 mg/dL (ref 1.7–2.4)

## 2022-10-10 MED ORDER — POTASSIUM CHLORIDE 20 MEQ PO PACK: 60.0000 meq | PACK | Freq: Three times a day (TID) | ORAL | Status: DC

## 2022-10-10 MED ORDER — METHADONE HCL 10 MG PO TABS
10.0000 mg | ORAL_TABLET | Freq: Three times a day (TID) | ORAL | Status: DC
  Administered 2022-10-10 – 2022-10-15 (×14): 10 mg via ORAL
  Filled 2022-10-10 (×14): qty 1

## 2022-10-10 MED ORDER — METHADONE HCL 10 MG PO TABS: 5.0000 mg | ORAL_TABLET | Freq: Three times a day (TID) | ORAL | Status: DC

## 2022-10-10 MED ORDER — POTASSIUM CHLORIDE 10 MEQ/100ML IV SOLN
10.0000 meq | INTRAVENOUS | Status: AC
  Administered 2022-10-10 (×4): 10 meq via INTRAVENOUS
  Filled 2022-10-10 (×4): qty 100

## 2022-10-10 MED ORDER — INSULIN GLARGINE-YFGN 100 UNIT/ML ~~LOC~~ SOLN
30.0000 [IU] | Freq: Every day | SUBCUTANEOUS | Status: DC
  Administered 2022-10-11 – 2022-10-13 (×3): 30 [IU] via SUBCUTANEOUS
  Filled 2022-10-10 (×3): qty 0.3

## 2022-10-10 MED ORDER — POTASSIUM CHLORIDE 20 MEQ PO PACK
40.0000 meq | PACK | Freq: Three times a day (TID) | ORAL | Status: DC
  Administered 2022-10-10 (×2): 40 meq via ORAL
  Filled 2022-10-10 (×2): qty 2

## 2022-10-10 MED ORDER — MAGNESIUM SULFATE 2 GM/50ML IV SOLN
2.0000 g | Freq: Once | INTRAVENOUS | Status: AC
  Administered 2022-10-10: 2 g via INTRAVENOUS
  Filled 2022-10-10: qty 50

## 2022-10-10 MED ORDER — METHADONE HCL 5 MG PO TABS: 7.5000 mg | ORAL_TABLET | Freq: Three times a day (TID) | ORAL | Status: DC

## 2022-10-10 NOTE — Progress Notes (Signed)
NEUROLOGY CONSULTATION PROGRESS NOTE   Date of service: October 10, 2022 Patient Name: Jeremy Medina. MRN:  [id number] DOB:  [birth year]  Brief HPI  72 year old male with progressively worsening cognition, gait changes with falls, and urinary incontinence x 3 months. Had normal functioning baseline prior to January. Admitted 09/28/22. PMH: chronic pain, depression, anxiety, HTN, DM2, HLD, folate deficiency, PTSD, hx of tobacco and alcohol abuse (quit drinking in his 30's; current smoker; 54 pack years), IDA, and lung nodules.  Interval Hx   No family at the bedside. Patient intermittently following commands but will not speak to me during exam.   Vitals   Vitals:   10/09/22 1614 10/09/22 1710 10/09/22 1933 10/10/22 0532  BP: (!) 149/91 129/79 (!) 146/91 (!) 156/83  Pulse: 85 80 85 92  Resp: Temp: 98.1 F (36.7 C) 98 F (36.7 C) (!) 97.4 F (36.3 C) 97.7 F (36.5 C)  TempSrc: Oral Oral Oral Oral  SpO2: 99% 100% 100% 93%  Weight:      Height:         Body mass index is 32.28 kg/m.  Physical Exam   General: sitting comfortably in recliner; in no acute distress. HENT: Normal oropharynx and mucosa. Normal external appearance of ears and nose.  Neck: Supple, no pain or tenderness  CV: No JVD. No peripheral edema.  Pulmonary: Symmetric Chest rise. Normal respiratory effort.  Abdomen: Soft to touch, non-tender.  Ext: No cyanosis, edema, or deformity  Skin: No rash. Normal palpation of skin.   Musculoskeletal: Normal digits and nails by inspection. No clubbing.   Neurologic Examination  Mental status/Cognition: Alert, makes eye contact. Intermittently follows 1 step commands. Speech/language: non fluent, dysarthric speech, intermittently follows simple commands. Does not repeat. Seems to participate as he desires.  Cranial nerves:   CN II Pupils equal and reactive to light, blinks to threat BL   CN III,IV,VI EOM intact, no gaze preference or deviation, no  nystagmus   CN V normal sensation in V1, V2, and V3 segments bilaterally   CN VII Symmetric facial grimace.   CN VIII Makes eye contact to speech   CN IX & X Protecting airway but would not open his mouth on command   CN XI Head midline.   CN XII Does not protrude tongue on command.   Motor:  Muscle bulk: normal, tone normal Moves all extremities spontaneously and purposefully. Wiggles toes on commands, legs drift down when held up off the recliner. Gives thumbs up with bilateral upper extremities and squeezes fingers with bilateral upper extremity on command.  Sensation:  Light touch    Pin prick Localizes to pinch in all extremities.   Temperature    Vibration   Proprioception    Coordination/Complex Motor:  Unable to assess.  Labs   Basic Metabolic Panel:  Lab Results  Component Value Date   NA 136 10/10/2022   K 3.2 (L) 10/10/2022   CO2 24 10/10/2022   GLUCOSE 280 (H) 10/10/2022   BUN 22 10/10/2022   CREATININE 1.31 (H) 10/10/2022   CALCIUM 9.1 10/10/2022   GFRNONAA 59 (L) 10/10/2022   GFRAA >60 10/11/2015   HbA1c:  Lab Results  Component Value Date   HGBA1C 9.7 (H) 09/13/2022   LDL: No results found for: "LDLCALC" Urine Drug Screen:     Component Value Date/Time   LABOPIA NONE DETECTED 09/14/2022 0357   COCAINSCRNUR NONE DETECTED 09/14/2022 0357   COCAINSCRNUR NEG 10/19/2015 1332  LABBENZ NONE DETECTED 09/14/2022 0357   AMPHETMU NONE DETECTED 09/14/2022 0357   THCU NONE DETECTED 09/14/2022 0357   LABBARB NONE DETECTED 09/14/2022 0357    Alcohol Level     Component Value Date/Time   ETH <10 09/28/2022 1535   No results found for: "PHENYTOIN", "ZONISAMIDE", "LAMOTRIGINE", "LEVETIRACETA" No results found for: "PHENYTOIN", "PHENOBARB", "VALPROATE", "CBMZ"  Imaging and Diagnostic studies   Pertinent Labs: -Cryptococcal antigen from 10/03/2022 negative -Cytology for from 10/03/2022 negative for malignancy with lymphocyte predominant inflammation  present -CSF from 10/03/2022 with elevated WBCs at 60, RBCs 18, monocyte predominant, remainder lymphocytes, no neutrophils. -CSF culture with no growth x 2 days. -Protein elevated in CSF at 365, glucose normal at 67 -Fungus culture negative for 2 days -Meningitis panel negative -HIV negative -VDRL CSF nonreactive -Blastomyces antigen pending -MT-RIF NAA w/ culture pending -Karius pending -QuantiFERON-TB gold send out pending  Lumbar puncture on 10/03/2022 and interventional radiology -Opening pressure elevated at 36 cm H2O, closing pressure 10 cm H2O -15 mL of clear, straw-colored CSF obtained before flow halted  EEG 10/02/22 Findings consistent with nonspecific encephalopathy  MRI Brain w/o C 09/14/22: 1. No acute intracranial abnormality. 2. Chronic white matter changes, most commonly due to chronic small vessel ischemia.  MRVenogram head: Negative for dural venous sinus thrombosis.  Impression   Jeremy Medina. 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 Plateaued on exam.  Recommendations  - Continue to avoid sedating and deliriogenic medications.  - Continue amphotericin B liposome per ID team. - CSF histomyces antigen, blastomyces ag, cocci antibodies, MTB PCR, Karius test, serum Coccidioides antibodies by CF and ID, urine histoplasma ag, blastomyces ag. Quantiferon gold, ACE level have been ordered by ID.  - Fungal cultures are pending. Of note, fungal cultures for the above organisms are low yield. ID has asked the lab to incubate cultures for 6 weeks Initially felt to have NPH. Unlikely NPH given elevated opening pressure. - Primary team to address abdominal tenderness, difficulty swallowing, and concerns for nausea. - Recommend up in chair with safety measures as able. - PT/OT as exam continues to improve. - ID team requesting repeat LP towards the end of week. Would need to be done under fluoro as he failed  it at bedside initially.  ______________________________________________________________________   Patient seen and examined by NP/APP with MD. MD to update note as needed.   Elmer Picker, DNP, FNP-BC [facility] Pager: [phone]  NEUROHOSPITALIST ADDENDUM Performed a face to face diagnostic evaluation.   I have reviewed the contents of history and physical exam as documented by PA/ARNP/Resident and agree with above documentation.  I have discussed and formulated the above plan as documented. Edits to the note have been made as needed.  Erick Blinks, MD [facility] [phone]   If 7pm to 7am, please call on call as listed on AMION.

## 2022-10-10 NOTE — Progress Notes (Signed)
Speech Language Pathology Treatment: Dysphagia  Patient Details Name: Jeremy Medina. MRN: [id number] DOB: 1950/10/01 Today's Date: 10/10/2022 Time: 1455-1510 SLP Time Calculation (min) (ACUTE ONLY): 15 min  Assessment / Plan / Recommendation Clinical Impression  Patient seen by SLP for skilled treatment focused on dysphagia goals. When SLP arrived, patient lying in bed with eyes closed but he opened them when SLP at bedside. He allowed SLP to raise [facility] and this did increase his alertness a little. His lunch tray was in room but untouched. Patient refused (by closing lips and moving away) solids offered. He was willing to drink some soda but as he was not able to draw any liquid through the straw, SLP presented into his hand for cup sips. Patient able to take a few small cup sips with SLP providing hand over hand support. Swallow initiation appeared timely but patient continues to be very delayed in all actions. SLP will follow briefly for diet toleration.   HPI HPI: Patient is a 72 y.o. male with PMH: HTN, DM, chronic pain on methadone, PTSD/depression/anxiety on daily benzodiazepine. An EGD performed on 09/06/22 showed Duodenitis with focal erosion and reactive/regenerative glandular changes, Chronic active H. pylori gastritis. Patient presented to the hospital on 09/28/2022 with AMS and progressive decline. MRI performed at [facility] concerning for hydrocephalus and neurosurgeon recommended patient present to ED. LP performed on 4/17 and was concerning for fungal PNA. 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 SLP swallow evaluation ordered on 4/21 due to RN report of patient coughing and vomiting with PO's. In addition, in Neurologist's note from AM 4/21, patient was gagging a little but no emesis observed.      SLP Plan  Continue with current plan of care      Recommendations for follow up therapy are one component of a multi-disciplinary discharge  planning process, led by the attending physician.  Recommendations may be updated based on patient status, additional functional criteria and insurance authorization.    Recommendations  Diet recommendations: Dysphagia 3 (mechanical soft);Thin liquid Liquids provided via: Cup;Straw Medication Administration: Other (Comment) Supervision: Full supervision/cueing for compensatory strategies;Trained caregiver to feed patient;Staff to assist with self feeding Compensations: Slow rate;Small sips/bites;Minimize environmental distractions Postural Changes and/or Swallow Maneuvers: Seated upright 90 degrees                  Oral care BID   Frequent or constant Supervision/Assistance Dysphagia, unspecified (R13.10)     Continue with current plan of care     Angela Nevin, MA, CCC-SLP Speech Therapy

## 2022-10-10 NOTE — Progress Notes (Signed)
Progress Note   Patient: Jeremy Medina. RUE:[id number] DOB: Jan 28, 1951 DOA: 09/28/2022     8 DOS: the patient was seen and examined on 10/10/2022 at 9:15AM   Brief hospital course: Mr. Trashaun Streight. is a 72 y.o. M with hx HTN, DM, chronic pain on methadone, PTSD/depression/anxiety on daily benzodiazepine who presented with concern for NPH.  Evidently recently admitted at [facility] for subacute encephalopathy (beginning ~Jan, 3 months PTA).  Work up there concerning for NPH, discharged to home with Neurology and Neurosurgery follow ups.  On day of admission, was seen in Neurosurgery clinic and sent to the ER for LP.  Incidentally found in the ER to have AKI Cr 2.3.    4/12: Admitted 4/14: Cr normalized, persistent encephalopathy noted 4/15: Neurology consulted 4/17: LP obtained 4/18: ID consulted, amphotericin started 4/19: Patient more alert 4/20-22: Not much clinical change   Assessment and Plan: * Suspected fungal meningitis Progressive subacute encephalopathy Progressive since January-had been seen by 7 urgent care visits and several visits to outpatient and felt to have normal pressure hydrocephalus at the [facility] and finally was admitted here from neurosurgery office with concerns for alternate causes and none could be found--- initially nonverbal, during first several days in hospital.   Neuraxial imaging with CT head and MRV showed no acute process.   No improvement with IV thiamine.   Neurology consulted, LP obtained that showed elevated protein and WBC.  No substantial improvement with LP, Neurology suspected fungal meningitis.   NPH was ruled out with elevated LP pressure.  (Previous mention of pseudotumor cerebrii was typo) Amphotericin B started and some initial clinical improvement, somewhat stalled at this point. - Continue amphotericin - Consult ID, appreciate involvement - Neuro suspect infectious meningitis, have signed off - Discussed with Dr. Earlene Plater and I agree with his  assessment that the patient will need another LP to redefine what may be going on in the cerebrospinal fluid - Discussed with therapy who felt that the patient had a wide-based gait on ambulation and significant posterior lean and will probably need acute skilled care at discharge  Vomiting Does not seem to be present now US abdomen unremarkable, some fatty liver, presumably NASH.   Transaminitis Mild.  Hepatitis serologies negative.  Probably NASH given normal [place]. -Monitor trends  Diabetes mellitus type 2, controlled, without complications Glucose elevated  - Continue sliding scale corrections, increase dose-CBGs are ranging from 2 13-2 80 and I have increased the glargine to 30 units at bedtime [Amphotericin is being given in D5 which may be rising the sugars] - Hold home 70/30 - Hold home Crestor  Benign essential hypertension BP elevated  - Continue amlodipine  - Resume home Lisinopril   Chronic pain syndrome - Continue home methadone 10 3 times daily-if no improvement would probably drop the dose to 5 3 times daily and check--[extreme long half-life which builds up in the system] Would also discontinue preferentially meperidine if we can  Hypokalemia Expected with Ampho - Supplement K, magnesium per RPh-appreciate input  Obesity (BMI 30-39.9) BMI 32  Folate deficiency B12 deficiency ruled out - Continue folate supplement  Acute kidney injury Creatinine 2.3 on admission.  US renal unremarkable.   Watch kidney function closely while on Amphotericin Hold lisinopril, start NS 50 cc/H  Hyperlipidemia - Hold home Crestor  Anxiety - Continue home Ativan, dose minimized  Depression Not on medication at home    Subjective:   sleepy still cannot really make much sense No fever chills no  nausea  Son at bedside   Physical Exam: BP 129/84 (BP Location: Left Arm)   Pulse 87   Temp 98 F (36.7 C)   Resp 18   Ht  (1.778 m)   Wt 102.1 kg   SpO2 97%    BMI 32.28 kg/m    Awake coherent Cta b no added sound Abd soft nt nd no rebound no gaurding Some nystagmus to the R side Neck soft  Family Communication: long discussion with son Disposition: Status is: Inpatient The patient presented with subacute encephalopathy  Consensus at this point is that this is most likely a fungal meningitis although considerable amount of tests are not resulted yet and this is still working diagnosis ID were consulted, neurology have signed off Will plan for LP later this week, continue Amphotericin and follow      Author: Rhetta Mura, MD 10/10/2022 6:33 PM  For on call review [email or web address].

## 2022-10-10 NOTE — Progress Notes (Signed)
Physical Therapy Treatment Patient Details Name: Jeremy Medina. MRN: [id number] DOB: [birth year] Today's Date: 10/10/2022   History of Present Illness Jeremy Medina. is a 72 y.o. male who presents complaining of altered mental status and progressive decline.  MRI performed at [facility], concern for hydrocephalus and neurosurgeon recommended pt present to ED. Work-up pending. LP 10/03/2022. PMHx: type 2 diabetes, PTSD, chronic pain on methadone per son, hypertension, hyperlipidemia    PT Comments    Pt continues with minimal verbalization, flat affect, delayed response time, impaired motor planning and sequencing, requires multimodal cues and mod/maxA for transfers. Pt continues with significant posterior lean in sitting and standing requiring constant verbal and tactile cues to maintain midline. Pt remains to benefit from inpatient rehab <3 hrs a day to address above deficits. Acute PT to cont to follow.   Recommendations for follow up therapy are one component of a multi-disciplinary discharge planning process, led by the attending physician.  Recommendations may be updated based on patient status, additional functional criteria and insurance authorization.  Follow Up Recommendations  Can patient physically be transported by private vehicle: No    Assistance Recommended at Discharge Frequent or constant Supervision/Assistance  Patient can return home with the following Two people to help with walking and/or transfers;Assistance with cooking/housework;Help with stairs or ramp for entrance;Assist for transportation   Equipment Recommendations  Other (comment) (TBA)    Recommendations for Other Services       Precautions / Restrictions Precautions Precautions: Fall Precaution Comments: posterior lean Restrictions Weight Bearing Restrictions: No     Mobility  Bed Mobility Overal bed mobility: Needs Assistance Bed Mobility: Supine to Sit     Supine to sit: Max assist, +2 for  physical assistance     General bed mobility comments: attempted tactile cues to assist pt with reaching across body with R UE for L bed rail however pt unable to process multimodal cues and ultimately required maxA x2 to sit EOB, assist required for trunk elevation and LE management.    Transfers Overall transfer level: Needs assistance Equipment used: Rolling walker (2 wheels) Transfers: Sit to/from Stand, Bed to chair/wheelchair/BSC Sit to Stand: Mod assist, +2 physical assistance           General transfer comment: pt completed sit to stand with bilat HHA and then again with RW. Pt with improved anterior translation with tactile cues from bilat  HHA vs RW. pt with strong posterior lean requiring max verbal and tactile cues to achieve midline    Ambulation/Gait Ambulation/Gait assistance: Max assist, +2 safety/equipment Gait Distance (Feet): 5 Feet Assistive device: 2 person hand held assist Gait Pattern/deviations: Shuffle, Leaning posteriorly, Wide base of support Gait velocity: reduced Gait velocity interpretation: <1.8 ft/sec, indicate of risk for recurrent falls   General Gait Details: pt with short shuffled steps despite increased support and assist for weight shift. pt with progressive bilat LE external rotation and inability to sequence steps   Stairs             Wheelchair Mobility    Modified Rankin (Stroke Patients Only)       Balance Overall balance assessment: Needs assistance Sitting-balance support: Feet supported, Bilateral upper extremity supported Sitting balance-Leahy Scale: Poor Sitting balance - Comments: posterior leaning seated on EOB, attempted to use mirror for biofeedback however unsuccessful Postural control: Posterior lean Standing balance support: Bilateral upper extremity supported, Reliant on assistive device for balance Standing balance-Leahy Scale: Poor Standing balance comment: reliant on external support  Cognition Arousal/Alertness: Awake/alert Behavior During Therapy: Flat affect Overall Cognitive Status: Difficult to assess Area of Impairment: Orientation, Attention, Memory, Following commands, Safety/judgement, Awareness, Problem solving                 Orientation Level: Disoriented to, Time, Situation, Place (able to state name but nonverbal/no response when asked place and date) Current Attention Level: Focused (easily distracted, short attn span)   Following Commands: Follows one step commands inconsistently Safety/Judgement: Decreased awareness of safety, Decreased awareness of deficits Awareness: Intellectual Problem Solving: Slow processing, Decreased initiation, Difficulty sequencing, Requires verbal cues General Comments: minimal vocalization, minimal initiation of task asked, follow simpled commands <25% of time        Exercises      General Comments        Pertinent Vitals/Pain Pain Assessment Pain Assessment: Faces Faces Pain Scale: No hurt    Home Living                          Prior Function            PT Goals (current goals can now be found in the care plan section) Acute Rehab PT Goals Patient Stated Goal: unable to state PT Goal Formulation: Patient unable to participate in goal setting Time For Goal Achievement: 10/13/22 Potential to Achieve Goals: Fair Progress towards PT goals: Progressing toward goals    Frequency    Min 3X/week      PT Plan Current plan remains appropriate;Frequency needs to be updated    Co-evaluation              AM-PAC PT "6 Clicks" Mobility   Outcome Measure  Help needed turning from your back to your side while in a flat bed without using bedrails?: A Lot Help needed moving from lying on your back to sitting on the side of a flat bed without using bedrails?: A Lot Help needed moving to and from a bed to a chair (including a wheelchair)?: A Lot Help needed standing up  from a chair using your arms (e.g., wheelchair or bedside chair)?: A Lot Help needed to walk in hospital room?: Total Help needed climbing 3-5 steps with a railing? : Total 6 Click Score: 10    End of Session Equipment Utilized During Treatment: Gait belt Activity Tolerance: Patient limited by fatigue Patient left: in chair;with chair alarm set;with call bell/phone within reach;with family/visitor present Nurse Communication: Mobility status (use Stedy to assist pt back to bed) PT Visit Diagnosis: Unsteadiness on feet (R26.81);Muscle weakness (generalized) (M62.81)     Time: 0454-0981 PT Time Calculation (min) (ACUTE ONLY): 26 min  Charges:  $Gait Training: 8-22 mins $Therapeutic Activity: 8-22 mins                     Lewis Shock, PT, DPT Acute Rehabilitation Services Secure chat preferred Office #: [phone]    Iona Hansen 10/10/2022, 10:53 AM

## 2022-10-10 NOTE — Inpatient Diabetes Management (Signed)
Inpatient Diabetes Program Recommendations  AACE/ADA: New Consensus Statement on Inpatient Glycemic Control (2015)  Target Ranges:  Prepandial:   less than 140 mg/dL      Peak postprandial:   less than 180 mg/dL (1-2 hours)      Critically ill patients:  140 - 180 mg/dL   Lab Results  Component Value Date   GLUCAP 237 (H) 10/10/2022   HGBA1C 9.7 (H) 09/13/2022    Review of Glycemic Control  Latest Reference Range & Units 10/09/22 13:35 10/09/22 17:08 10/09/22 18:05 10/09/22 21:06 10/10/22 08:01  Glucose-Capillary 70 - 99 mg/dL 409 (H) 811 (H) 914 (H) 237 (H) 237 (H)  (H): Data is abnormally high  Diabetes history: DM2 Outpatient Diabetes medications: 70/30 0-20 units TID Current orders for Inpatient glycemic control: Semglee 24 units QD, Novolog 0-15 units TID and 0-5 units QHS  Inpatient Diabetes Program Recommendations:    Semglee 30 units QAM  Will continue to follow while inpatient.  Thank you, Dulce Sellar, MSN, CDCES Diabetes Coordinator Inpatient Diabetes Program [phone] (team pager from 8a-5p)

## 2022-10-10 NOTE — Progress Notes (Signed)
Pharmacy Anti-infective Note  Jeremy Hank. is a 72 y.o. male admitted on 09/28/2022 with AMS/encephalopathy and concern for meningitis. LP done on 4/17 shows 40% CSF/serum glucose ratio, elevated protein with concern for fungal PNA. Crypto neg, additional testing sent for coccidioides, histoplasmosis, blastomyces, TB. Pharmacy has been consulted for empiric liposomal amphotericin B dosing.  The patient was noted to have AKI on admission - resolved and down to 0.97 prior to starting ampho B (2.2 on admission).   SCr with a notable bump up today to 1.31 << 0.99 in the setting of starting lisinopril 5 mg for BP control yesterday. A small bump in SCr is to be expected - but will continue to monitor closely. Continue pre/post NS boluses w/ D5W flushes.   K 3.2 << 3.1 (s/p 120 mEq yesterday) -  will need aggressive repletion, Mg 2.1 << 1.8 s/p Mg 4g yesterday.   The fluid, flush, and medication schedule is below:  Sodium Chloride bolus over an hour at ~1400 Dex 5% flush at ~1500  Ampho dose due at ~1600 daily  Dex 5% flush at ~1700  Sodium Chloride over an hour at ~1700  Plan: - Continue Liposomal Amphotericin B 500 mg (~5 mg/kg) IV every 24 hours - Pre/post NS fluid boluses of 500 cc + D5W flushes before/after drug administration - Magnesium 2g IV x 1 dose for maintenance - K-runs x 4 this morning - Continue scheduled KCl 40 mEq packets (dysphagia diet) tid for now for maintenance - will monitor for need to adjust and/or replete more - Will monitor lytes and renal function closely - daily BMET + Mg - Will follow-up on additional testing to determine duration of therapy   Height:  (177.8 cm) Weight: 102.1 kg (225 lb) IBW/kg (Calculated) : 73  Temp (24hrs), Avg:97.8 F (36.6 C), Min:97.4 F (36.3 C), Max:98.1 F (36.7 C)  Recent Labs  Lab 10/04/22 0347 10/05/22 0450 10/06/22 0438 10/07/22 0318 10/08/22 0405 10/09/22 0440 10/10/22 0415  WBC 14.5*  --    --  10.7* 9.9 11.6* 13.7*  CREATININE 0.97   < > 1.06 1.11 1.10 0.99 1.31*   < > = values in this interval not displayed.     Estimated Creatinine Clearance: 62.8 mL/min (A) (by C-G formula based on SCr of 1.31 mg/dL (H)).    Allergies  Allergen Reactions   Bee Venom Anaphylaxis    Antimicrobials this admission: Ampho B >>  Dose adjustments this admission:   Microbiology results: 4/12 BCx >> ngx5d 4/17 CSF cx >> ng<24h  Thank you for allowing pharmacy to be a part of this patient's care.  Georgina Pillion, PharmD, BCPS Infectious Diseases Clinical Pharmacist 10/10/2022 7:44 AM   **Pharmacist phone directory can now be found on [email or web address] ([email or web address]).  Listed under [facility].

## 2022-10-11 ENCOUNTER — Inpatient Hospital Stay (HOSPITAL_COMMUNITY): Payer: No Typology Code available for payment source

## 2022-10-11 DIAGNOSIS — G030 Nonpyogenic meningitis: Secondary | ICD-10-CM | POA: Diagnosis not present

## 2022-10-11 LAB — CBC
HCT: 40.7 % (ref 39.0–52.0)
Hemoglobin: 13.9 g/dL (ref 13.0–17.0)
MCH: 28 pg (ref 26.0–34.0)
MCHC: 34.2 g/dL (ref 30.0–36.0)
MCV: 81.9 fL (ref 80.0–100.0)
Platelets: 226 10*3/uL (ref 150–400)
RBC: 4.97 MIL/uL (ref 4.22–5.81)
RDW: 14.2 % (ref 11.5–15.5)
WBC: 13 10*3/uL — ABNORMAL HIGH (ref 4.0–10.5)
nRBC: 0 % (ref 0.0–0.2)

## 2022-10-11 LAB — COMPREHENSIVE METABOLIC PANEL
ALT: 59 U/L — ABNORMAL HIGH (ref 0–44)
AST: 32 U/L (ref 15–41)
Albumin: 3 g/dL — ABNORMAL LOW (ref 3.5–5.0)
Alkaline Phosphatase: 74 U/L (ref 38–126)
Anion gap: 10 (ref 5–15)
BUN: 22 mg/dL (ref 8–23)
CO2: 25 mmol/L (ref 22–32)
Calcium: 10 mg/dL (ref 8.9–10.3)
Chloride: 105 mmol/L (ref 98–111)
Creatinine, Ser: 1.29 mg/dL — ABNORMAL HIGH (ref 0.61–1.24)
GFR, Estimated: 60 mL/min — ABNORMAL LOW (ref >60)
Glucose, Bld: 167 mg/dL — ABNORMAL HIGH (ref 70–99)
Potassium: 3.2 mmol/L — ABNORMAL LOW (ref 3.5–5.1)
Sodium: 140 mmol/L (ref 135–145)
Total Bilirubin: 0.5 mg/dL (ref 0.3–1.2)
Total Protein: 6.4 g/dL — ABNORMAL LOW (ref 6.5–8.1)

## 2022-10-11 LAB — MENINGITIS/ENCEPHALITIS PANEL (CSF)

## 2022-10-11 LAB — PROTEIN AND GLUCOSE, CSF
Glucose, CSF: 43 mg/dL (ref 40–70)
Total  Protein, CSF: 272 mg/dL — ABNORMAL HIGH (ref 15–45)

## 2022-10-11 LAB — FUNGITELL BETA-D-GLUCAN
Fungitell Value:: <31.25 pg/mL
Result Name:: NEGATIVE

## 2022-10-11 LAB — MISC LABCORP TEST (SEND OUT)

## 2022-10-11 LAB — MAGNESIUM: Magnesium: 1.7 mg/dL (ref 1.7–2.4)

## 2022-10-11 LAB — CSF CELL COUNT WITH DIFFERENTIAL
Eosinophils, CSF: 0 % (ref 0–1)
Lymphs, CSF: 38 % — ABNORMAL LOW (ref 40–80)
Monocyte-Macrophage-Spinal Fluid: 61 % — ABNORMAL HIGH (ref 15–45)
RBC Count, CSF: 1 /mm3 — ABNORMAL HIGH
Segmented Neutrophils-CSF: 1 % (ref 0–6)
Tube #: 3
WBC, CSF: 29 /mm3 (ref 0–5)

## 2022-10-11 LAB — GLUCOSE, CAPILLARY
Glucose-Capillary: 282 mg/dL — ABNORMAL HIGH (ref 70–99)
Glucose-Capillary: 126 mg/dL — ABNORMAL HIGH (ref 70–99)
Glucose-Capillary: 200 mg/dL — ABNORMAL HIGH (ref 70–99)

## 2022-10-11 LAB — CSF CULTURE W GRAM STAIN

## 2022-10-11 LAB — BLASTOMYCES ANTIGEN
Blastomyces Antigen: NOT DETECTED ng/mL
Blastomyces Antigen: NOT DETECTED ng/mL

## 2022-10-11 MED ORDER — POTASSIUM CHLORIDE 10 MEQ/100ML IV SOLN
10.0000 meq | INTRAVENOUS | Status: AC
  Administered 2022-10-11 (×2): 10 meq via INTRAVENOUS
  Filled 2022-10-11 (×2): qty 100

## 2022-10-11 MED ORDER — LIDOCAINE HCL (PF) 1 % IJ SOLN
5.0000 mL | Freq: Once | INTRAMUSCULAR | Status: DC
  Filled 2022-10-11: qty 5

## 2022-10-11 MED ORDER — POTASSIUM CHLORIDE 20 MEQ PO PACK
40.0000 meq | PACK | Freq: Four times a day (QID) | ORAL | Status: DC
  Administered 2022-10-11 – 2022-10-12 (×8): 40 meq via ORAL
  Filled 2022-10-11 (×8): qty 2

## 2022-10-11 MED ORDER — POTASSIUM CHLORIDE 10 MEQ/100ML IV SOLN
10.0000 meq | INTRAVENOUS | Status: AC
  Administered 2022-10-11 (×4): 10 meq via INTRAVENOUS
  Filled 2022-10-11 (×4): qty 100

## 2022-10-11 MED ORDER — SODIUM CHLORIDE 0.9 % IV SOLN
2.0000 g | INTRAVENOUS | Status: DC
  Administered 2022-10-11 – 2022-10-12 (×4): 2 g via INTRAVENOUS
  Filled 2022-10-11 (×5): qty 2000

## 2022-10-11 MED ORDER — SODIUM CHLORIDE 0.9 % IV SOLN: 2.0000 g | INTRAVENOUS | Status: DC

## 2022-10-11 MED ORDER — VANCOMYCIN HCL 750 MG/150ML IV SOLN
750.0000 mg | Freq: Two times a day (BID) | INTRAVENOUS | Status: DC
  Administered 2022-10-12: 750 mg via INTRAVENOUS
  Filled 2022-10-11: qty 150

## 2022-10-11 MED ORDER — LORAZEPAM 2 MG/ML IJ SOLN: 1.0000 mg | Freq: Once | INTRAMUSCULAR | Status: DC

## 2022-10-11 MED ORDER — SODIUM CHLORIDE 0.9 % IV SOLN
2.0000 g | Freq: Two times a day (BID) | INTRAVENOUS | Status: DC
  Administered 2022-10-11 – 2022-10-13 (×4): 2 g via INTRAVENOUS
  Filled 2022-10-11 (×5): qty 20

## 2022-10-11 MED ORDER — VANCOMYCIN HCL 1750 MG/350ML IV SOLN
1750.0000 mg | Freq: Once | INTRAVENOUS | Status: AC
  Administered 2022-10-11: 1750 mg via INTRAVENOUS
  Filled 2022-10-11: qty 350

## 2022-10-11 MED ORDER — POTASSIUM CHLORIDE 10 MEQ/100ML IV SOLN: 10.0000 meq | INTRAVENOUS | Status: DC

## 2022-10-11 MED ORDER — MAGNESIUM SULFATE 4 GM/100ML IV SOLN
4.0000 g | Freq: Once | INTRAVENOUS | Status: AC
  Administered 2022-10-11: 4 g via INTRAVENOUS
  Filled 2022-10-11: qty 100

## 2022-10-11 NOTE — Progress Notes (Signed)
Pharmacy Antibiotic Note  Jeremy Medina. is a 72 y.o. male admitted on 09/28/2022 with meningitis.  Pharmacy has been consulted for CTX, ampicillin, vanco dosing.  Plan: Vancomycin 1750 mg IV x1 as LD f/b 750 mg IV every 12 hours.  Goal trough 20-25 mcg/mL CTX 2 grams iv q12h Ampicillin 2 grams iv q4h  Height:  (177.8 cm) Weight: 102.1 kg (225 lb) IBW/kg (Calculated) : 73  Temp (24hrs), Avg:98 F (36.7 C), Min:97.8 F (36.6 C), Max:98.3 F (36.8 C)  Recent Labs  Lab 10/07/22 0318 10/08/22 0405 10/09/22 0440 10/10/22 0415 10/11/22 0535  WBC 10.7* 9.9 11.6* 13.7* 13.0*  CREATININE 1.11 1.10 0.99 1.31* 1.29*    Estimated Creatinine Clearance: 63.8 mL/min (A) (by C-G formula based on SCr of 1.29 mg/dL (H)).    Allergies  Allergen Reactions   Bee Venom Anaphylaxis     Thank you for allowing pharmacy to be a part of this patient's care.  Greta Doom BS, PharmD, BCPS Clinical Pharmacist 10/11/2022 6:14 PM  Contact: [phone] after 3 PM  "Be curious, not judgmental..." -Debbora Dus

## 2022-10-11 NOTE — Progress Notes (Signed)
[facility]  Date of Admission:  09/28/2022           Reason for visit: Follow up on possible fungal meningitis  Current antibiotics: Amphotericin 10/04/2022--present    ASSESSMENT:    72 y.o. 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  He is less interactive and nonverbal today.  His initial lumbar puncture showed an elevated pressure of 36, CSF pleocytosis of 60 WBCs (87% monocytes).  Protein was elevated, glucose 67.  His CSF cultures are negative thus far and meningitis/encephalitis panel was negative.  His cytology showed no malignant cells.  There was speculation for a chronic fungal meningitis which prompted the initiation of Amphotericin.  He has been on this since April 18.  Multiple send out labs are pending at this time. Progressive subacute encephalopathy.  Lumbar puncture 10/04/2022: Opening pressure 36, closing pressure 10.  Only able to get 15 mL CSF cytology--no malignant cells CSF cultures--negative Crypto antigen--negative Meningitis encephalitis panel--negative Blastomyces--negative MTB/RIF--pending Coccidioides--pending Histo--pending  Serologic workup: QuantiFERON--negative Fungitell--pending Karius test--pending   RECOMMENDATIONS:    Will continue Amphotericin with pharmacy assistance at this time Multiple fungal send out labs are still pending from his prior CSF Follow-up serum Fungitell and Karius test Given his initial improvement and now decline since LP 1 week ago, would query whether fungal meningitis is an accurate diagnosis.  Perhaps initial improvement was related to relief of intracranial pressure which has subsequently reaccumulated.  This could certainly be due to fungal etiology but usually seen with crypto  meningitis which testing for has been negative Will attempt to repeat large-volume LP to obtain opening and closing pressure, cytology, autoimmune encephalitis panel, and paraneoplastic panel Discussed with Dr. Mahala Menghini, interventional radiology, pharmacy, neurology   Principal Problem:   Suspected fungal meningitis Active Problems:   Chronic pain syndrome   Depression   Anxiety   Benign essential hypertension   Diabetes mellitus type 2, controlled, without complications   Hyperlipidemia   Acute kidney injury   Progressive subacute encephalopathy   Folate deficiency   Transaminitis   Obesity (BMI 30-39.9)   Hypokalemia   Dysphagia   Vomiting    MEDICATIONS:    Scheduled Meds:  amLODipine  10 mg Oral Daily   dextrose  10 mL Intravenous Q24H   dextrose  10 mL Intravenous Q24H   folic acid  1 mg Oral Daily   insulin aspart  0-15 Units Subcutaneous TID WC   insulin aspart  0-5 Units Subcutaneous QHS   insulin glargine-yfgn  30 Units Subcutaneous Daily   lipase/protease/amylase  36,000 Units Oral TID AC   LORazepam  0.5 mg Oral QHS   methadone  10 mg Oral TID   potassium chloride  40 mEq Oral QID   sodium chloride  500 mL Intravenous Q24H   sodium chloride  500 mL Intravenous Q24H   sodium chloride flush  3 mL Intravenous Q12H   Continuous Infusions:  amphotericin B liposome (AMBISOME) 500 mg in dextrose 5 % 500 mL IVPB 500 mg (10/10/22 1702)   magnesium sulfate bolus IVPB     potassium chloride     PRN Meds:.acetaminophen, acetaminophen, diphenhydrAMINE **OR** diphenhydrAMINE, ondansetron **OR** ondansetron (ZOFRAN) IV, senna-docusate  SUBJECTIVE:   24 hour events:  No acute events noted overnight Morning labs notable for stable creatinine, potassium 3.2, magnesium 1.7,  WBC 13 Pharmacy helping to manage electrolyte derangements Send out fungal serologies and CSF labs still pending with exception of negative with exception of negative Blastomyces  Patient is less  interactive today compared to 2 days ago.  Review of Systems  Unable to perform ROS: Mental status change      OBJECTIVE:   Blood pressure (!) 134/107, pulse 90, temperature 97.8 F (36.6 C), temperature source Oral, resp. rate 16, height  (1.778 m), weight 102.1 kg, SpO2 100 %. Body mass index is 32.28 kg/m.  Physical Exam Constitutional:      Comments: Patient is lying in bed.  He will track you across the room.  He wiggles his toes.  He otherwise does not follow commands.  He is nonverbal.  HENT:     Head: Normocephalic and atraumatic.  Eyes:     Extraocular Movements: Extraocular movements intact.     Conjunctiva/sclera: Conjunctivae normal.  Abdominal:     General: There is no distension.     Palpations: Abdomen is soft.  Musculoskeletal:     Cervical back: Normal range of motion and neck supple.     Right lower leg: No edema.     Left lower leg: No edema.  Skin:    General: Skin is warm and dry.     Findings: No rash.      Lab Results: Lab Results  Component Value Date   WBC 13.0 (H) 10/11/2022   HGB 13.9 10/11/2022   HCT 40.7 10/11/2022   MCV 81.9 10/11/2022   PLT 226 10/11/2022    Lab Results  Component Value Date   NA 140 10/11/2022   K 3.2 (L) 10/11/2022   CO2 25 10/11/2022   GLUCOSE 167 (H) 10/11/2022   BUN 22 10/11/2022   CREATININE 1.29 (H) 10/11/2022   CALCIUM 10.0 10/11/2022   GFRNONAA 60 (L) 10/11/2022   GFRAA >60 10/11/2015    Lab Results  Component Value Date   ALT 59 (H) 10/11/2022   AST 32 10/11/2022   ALKPHOS 74 10/11/2022   BILITOT 0.5 10/11/2022    No results found for: "CRP"  No results found for: "ESRSEDRATE"   I have reviewed the micro and lab results in Epic.  Imaging: US Abdomen Complete  Result Date: 10/09/2022 CLINICAL DATA:  Transaminitis EXAM: ABDOMEN ULTRASOUND COMPLETE COMPARISON:  Renal ultrasound 09/28/2022. Abdomen pelvis CT 08/25/2015 and 08/02/2022. FINDINGS: Gallbladder: No gallstones or wall  thickening visualized. No sonographic Murphy sign noted by sonographer. Common bile duct: Diameter: 2 mm Liver: Heterogeneous hepatic parenchyma consistent with fatty liver infiltration. With this level of heterogeneity evaluation for underlying mass lesion is limited. Please correlate with prior contrast CT scan of 08/02/2022. Portal vein is patent on color Doppler imaging with normal direction of blood flow towards the liver. IVC: Obscured by overlapping bowel gas. Pancreas: Obscured by overlapping bowel gas and soft tissue. Spleen: 10.1 cm Right Kidney: Length: 8.9 cm. Partially obscured by overlapping bowel gas and soft tissue. Left Kidney: Length: 11.6. Echogenicity within normal limits. No mass or hydronephrosis visualized. Abdominal aorta: Visualized portions are preserved. Other findings: None. IMPRESSION: Fatty liver infiltration. No gallstones or ductal dilatation. Portions of the of the abdomen are obscured by overlapping bowel gas and soft tissue. Electronically Signed   By: Karen Kays M.D.   On: 10/09/2022 14:32     Imaging independently reviewed in Epic.    Vedia Coffer for Infectious Disease [facility] [phone] pager 10/11/2022, 8:10 AM  I have personally spent 50 minutes involved in face-to-face and non-face-to-face activities for this patient on the day of the visit. Professional time spent includes the following activities: Preparing to see the patient (review of tests), Obtaining and/or reviewing separately obtained history (admission/discharge record), Performing a medically appropriate examination and/or evaluation , Ordering medications/tests/procedures, referring and communicating with other health care professionals, Documenting clinical information in the EMR, Independently interpreting results (not separately reported), Communicating results to the patient/family/caregiver, Counseling and educating the patient/family/caregiver and Care  coordination (not separately reported).

## 2022-10-11 NOTE — Progress Notes (Addendum)
Pharmacy Anti-infective Note  Jeremy Medina. is a 72 y.o. male admitted on 09/28/2022 with AMS/encephalopathy and concern for meningitis. LP done on 4/17 shows 40% CSF/serum glucose ratio, elevated protein with concern for fungal PNA. Crypto neg, additional testing sent for coccidioides, histoplasmosis, blastomyces, TB. Pharmacy has been consulted for empiric liposomal amphotericin B dosing.  The patient was noted to have AKI on admission - resolved and down to 0.97 prior to starting ampho B (2.2 on admission).   SCr with a notable bump up today to 1.29 << 1.31 << 0.99, initial bump up noted after lisinopril resumed 4/23 - now d/ced after 4/24 dose - SCr now stabilizing but will continue to monitor closely. Continue pre/post NS boluses w/ D5W flushes.   K 3.2 << 3.2 (s/p 160 mEq yesterday) -  will continue to need aggressive repletion, Mg 1.7 << 2.1 << s/p Mg 2g yesterday.   The fluid, flush, and medication schedule is below:  Sodium Chloride bolus over an hour at ~1400 Dex 5% flush at ~1500  Ampho dose due at ~1600 daily  Dex 5% flush at ~1700  Sodium Chloride over an hour at ~1700  Plan: - Continue Liposomal Amphotericin B 500 mg (~5 mg/kg) IV every 24 hours - Pre/post NS fluid boluses of 500 cc + D5W flushes before/after drug administration - Magnesium 4g IV x 1 dose  - K-runs x 6 this morning - Adjust scheduled KCl 40 mEq packets (dysphagia diet) to 4x/day for now for maintenance - will monitor for need to adjust and/or replete more - Will monitor lytes and renal function closely - daily BMET + Mg - Will follow-up on additional testing to determine duration of therapy   Height:  (177.8 cm) Weight: 102.1 kg (225 lb) IBW/kg (Calculated) : 73  Temp (24hrs), Avg:98 F (36.7 C), Min:97.8 F (36.6 C), Max:98.3 F (36.8 C)  Recent Labs  Lab 10/07/22 0318 10/08/22 0405 10/09/22 0440 10/10/22 0415 10/11/22 0535  WBC 10.7* 9.9 11.6* 13.7* 13.0*   CREATININE 1.11 1.10 0.99 1.31* 1.29*    Estimated Creatinine Clearance: 63.8 mL/min (A) (by C-G formula based on SCr of 1.29 mg/dL (H)).    Allergies  Allergen Reactions   Bee Venom Anaphylaxis    Antimicrobials this admission: Ampho B >>  Dose adjustments this admission:   Microbiology results: 4/12 BCx >> ngx5d 4/17 CSF cx >> ng<24h  Thank you for allowing pharmacy to be a part of this patient's care.  Georgina Pillion, PharmD, BCPS Infectious Diseases Clinical Pharmacist 10/11/2022 8:01 AM   **Pharmacist phone directory can now be found on [email or web address] ([email or web address]).  Listed under [facility].

## 2022-10-11 NOTE — Progress Notes (Addendum)
Progress Note   Patient: Jeremy Medina. ZOX:[id number] DOB: 1951/04/01 DOA: 09/28/2022     9 DOS: the patient was seen and examined on 10/11/2022 at 9:15AM   Brief hospital course: Mr. Anselm Aumiller. is a 72 y.o. M with hx HTN, DM, chronic pain on methadone, PTSD/depression/anxiety on daily benzodiazepine who presented with concern for NPH.  Evidently recently admitted at [facility] for subacute encephalopathy (beginning ~Jan, 3 months PTA).  Work up there concerning for NPH, discharged to home with Neurology and Neurosurgery follow ups.  On day of admission, was seen in Neurosurgery clinic and sent to the ER for LP.  Incidentally found in the ER to have AKI Cr 2.3.    4/12: Admitted 4/14: Cr normalized, persistent encephalopathy noted 4/15: Neurology consulted 4/17: LP obtained 4/18: ID consulted, amphotericin started 4/19: Patient more alert 4/20-4/24--intermittent waxing waning mentation 4/25 immediately post LP more lucid-conversant short sentences---Fungal meningitis Etiology now questioned--LP shows 29 WBC on champagne tap---Broadened Abx coverage    Assessment and Plan: * Suspected fungal meningitis Progressive subacute encephalopathy Progressive since January-had been seen by 7 urgent care visits and several visits to outpatient and felt to have normal pressure hydrocephalus at the [facility] and finally was admitted here from neurosurgery office with concerns for alternate causes and none could be found--- initially nonverbal, during first several days in hospital.   Neuraxial imaging with CT head and MRV showed no acute process.   No improvement with IV thiamine.   Neurology consulted, LP obtained that showed elevated protein and WBC.  No substantial improvement with LP, Neurology suspected fungal meningitis.   LP performed again 4/25--34 cm water opening pressure patient evaluated subsequent to procedure and mentation improved he was verbalizing more and although he was still confused he  seemed better I am not completely sure we can rule out normal pressure hydrocephalus?-At this juncture however we are stuck with a diagnosis and a differential of either encephalitis NPH and or a fungal meningitis WBCs of 29 despite being on Amphotericin also can argue essentially for any type of infectious etiology?? - Continue amphotericin-adding vancomycin and ceftriaxone and ampicillin meningitic coverage for now without steroids given risk of worsening fungal meningitis -I will discuss the case again with ID and we may need to [facility] neurosurgery once we give the patient a trial of antimicrobial therapy as the only other differential would be NPH (patient made some recovery with LP 4/25) - Discussed with therapy who felt that the patient had a wide-based gait on ambulation and significant posterior lean and will probably need acute skilled care at discharge  Vomiting Does not seem to be present now-patient had some episodes after procedure.  4/25-if he continues to have abdominal pain I will get a CT abdomen pelvis Prior US abdomen unremarkable, some fatty liver, presumably NASH.   Transaminitis Mild.  Hepatitis serologies negative.  Probably NASH given normal [place]. -Monitor trends  Diabetes mellitus type 2, controlled, without complications Glucose elevated  - Continue sliding scale corrections, CBGs 1 26-2 82 Continue glargine to 30 units at bedtime [Amphotericin is being given in D5 which may be rising the sugars] - Hold home 70/30 - Hold home Crestor  Benign essential hypertension BP elevated  - Continue amlodipine 10mg  - Resume home Lisinopril   Chronic pain syndrome - Continue home methadone 10 3 times daily-if no improvement would probably drop the dose to 5 3 times daily and check--[extreme long half-life which builds up in the system] Meperidine diiscontinued 4/24  Hypokalemia Expected with Ampho - Supplement K, magnesium per RPh-appreciate input  Obesity (BMI  30-39.9) BMI 32  Folate deficiency B12 deficiency ruled out - Continue folate supplement  Acute kidney injury Creatinine 2.3 on admission.  US renal unremarkable.   Watch kidney function closely while on Amphotericin Hold lisinopril, start NS 50 cc/H and monitor chemistries carefully given on Amphotericin and vancomycin currently  Hyperlipidemia - Hold home Crestor  Anxiety - Continue home Ativan, dose minimized  Depression Not on medication at home    Subjective:   He seemed quite disoriented to the infectious disease per their note but when I saw him after the LP he was verbalizing 2-3 words, he was moving his hands although not purposefully seemingly with some intent and he was more alert to me than he was yesterday leading me to think that the LP had some sort of therapeutic effect He is unable to give me however full review of systems and his son is not present at this time   Physical Exam: BP 139/88 (BP Location: Right Arm)   Pulse 90   Temp 98 F (36.7 C) (Oral)   Resp 16   Ht 5\' 10"  (1.778 m)   Wt 102.1 kg   SpO2 95%   BMI 32.28 kg/m    Awake coherent x 1 Cta b no added sound Abd soft no rebound no guarding seems relatively soft able to follow some commands and can raise his arms on command Glabellar reflex is positive and he guards to [employer]   Family Communication: long discussion with son Disposition: Status is: Inpatient The patient presented with subacute encephalopathy  ?  Fungal meningitis?  NPH?  Other We will discuss with ID and may need to [facility] neurology/neurosurgery if he continues to decline post LP as therapeutic LP may be an indicator of patient having a component of NPH      Author: Rhetta Mura, MD 10/11/2022 5:48 PM  For on call review [email or web address].

## 2022-10-11 NOTE — Procedures (Signed)
L2/L3 lumbar puncture yielding 17.5 ml clear CSF for lab studies. Please see full dictation under the imaging tab in Epic.  Alwyn Ren, [place] [phone] 10/11/2022, 1:39 PM

## 2022-10-12 DIAGNOSIS — E785 Hyperlipidemia, unspecified: Secondary | ICD-10-CM | POA: Diagnosis not present

## 2022-10-12 DIAGNOSIS — E119 Type 2 diabetes mellitus without complications: Secondary | ICD-10-CM | POA: Diagnosis not present

## 2022-10-12 DIAGNOSIS — G030 Nonpyogenic meningitis: Secondary | ICD-10-CM | POA: Diagnosis not present

## 2022-10-12 DIAGNOSIS — G9340 Encephalopathy, unspecified: Secondary | ICD-10-CM | POA: Diagnosis not present

## 2022-10-12 LAB — CBC
HCT: 43.1 % (ref 39.0–52.0)
Hemoglobin: 14.4 g/dL (ref 13.0–17.0)
MCH: 28.1 pg (ref 26.0–34.0)
MCHC: 33.4 g/dL (ref 30.0–36.0)
MCV: 84 fL (ref 80.0–100.0)
Platelets: 250 10*3/uL (ref 150–400)
RBC: 5.13 MIL/uL (ref 4.22–5.81)
RDW: 14.6 % (ref 11.5–15.5)
WBC: 15.5 10*3/uL — ABNORMAL HIGH (ref 4.0–10.5)
nRBC: 0 % (ref 0.0–0.2)

## 2022-10-12 LAB — COMPREHENSIVE METABOLIC PANEL
ALT: 47 U/L — ABNORMAL HIGH (ref 0–44)
AST: 30 U/L (ref 15–41)
Albumin: 3.2 g/dL — ABNORMAL LOW (ref 3.5–5.0)
Alkaline Phosphatase: 73 U/L (ref 38–126)
Anion gap: 10 (ref 5–15)
BUN: 22 mg/dL (ref 8–23)
CO2: 22 mmol/L (ref 22–32)
Calcium: 9.6 mg/dL (ref 8.9–10.3)
Chloride: 110 mmol/L (ref 98–111)
Creatinine, Ser: 1.35 mg/dL — ABNORMAL HIGH (ref 0.61–1.24)
GFR, Estimated: 56 mL/min — ABNORMAL LOW (ref >60)
Glucose, Bld: 173 mg/dL — ABNORMAL HIGH (ref 70–99)
Potassium: 3.9 mmol/L (ref 3.5–5.1)
Sodium: 142 mmol/L (ref 135–145)
Total Bilirubin: 0.7 mg/dL (ref 0.3–1.2)
Total Protein: 6.7 g/dL (ref 6.5–8.1)

## 2022-10-12 LAB — GLUCOSE, CAPILLARY
Glucose-Capillary: 154 mg/dL — ABNORMAL HIGH (ref 70–99)
Glucose-Capillary: 245 mg/dL — ABNORMAL HIGH (ref 70–99)
Glucose-Capillary: 166 mg/dL — ABNORMAL HIGH (ref 70–99)
Glucose-Capillary: 245 mg/dL — ABNORMAL HIGH (ref 70–99)

## 2022-10-12 LAB — MAGNESIUM: Magnesium: 1.8 mg/dL (ref 1.7–2.4)

## 2022-10-12 LAB — CSF CULTURE W GRAM STAIN: Culture: NO GROWTH

## 2022-10-12 LAB — HISTOPLASMA ANTIGEN, URINE: Histoplasma Antigen, urine: NEGATIVE (ref <0.5)

## 2022-10-12 MED ORDER — MAGNESIUM SULFATE 50 % IJ SOLN: 6.0000 g | Freq: Once | INTRAVENOUS | Status: DC

## 2022-10-12 MED ORDER — MAGNESIUM SULFATE 50 % IJ SOLN
6.0000 g | Freq: Once | INTRAVENOUS | Status: AC
  Administered 2022-10-12: 6 g via INTRAVENOUS
  Filled 2022-10-12: qty 12

## 2022-10-12 NOTE — Progress Notes (Signed)
Mobility Specialist Progress Note   10/12/22 1407  Mobility  Activity Transferred from chair to bed  Level of Assistance Total care  Assistive Device Other (Comment) (HHA)  Activity Response Tolerated fair  Mobility Referral No  $Mobility charge 1 Mobility   RN requesting assistance to get pt from chair to bed d/t increase fall risk w/ pt consitently sliding down. Required total assist throughout d/t cognitive impairment and limited capabilities to follow commands. Lateral scoot to bed w/o fault . Pt left in bed with call bell in reach and bed alarm on.  Frederico Hamman Mobility Specialist Please contact via SecureChat or  Rehab office at [phone]

## 2022-10-12 NOTE — Progress Notes (Signed)
Physical Therapy Treatment Patient Details Name: Jeremy Medina. MRN: [id number] DOB: 12/15/1950 Today's Date: 10/12/2022   History of Present Illness Jeremy Medina. is a 72 y.o. male who presents complaining of altered mental status and progressive decline.  MRI performed at [facility], concern for hydrocephalus and neurosurgeon recommended pt present to ED. Work-up pending. LP 10/03/2022. PMHx: type 2 diabetes, PTSD, chronic pain on methadone per son, hypertension, hyperlipidemia    PT Comments    Pt was seen for mobility on chair with no ability to stand up even with max assist.  Pt is fairly lethargic and cannot focus well on the task.  Has a lot of mobility in previous sessions but today is not able to focus on the task and requires hands on assist to do any moving.  Pt did not stand as he exerted no effort with legs to push up to stand.  Follow along with him for carry over to stand maybe on Stedy, for getting up to chair and for progressing ROM to legs.     Recommendations for follow up therapy are one component of a multi-disciplinary discharge planning process, led by the attending physician.  Recommendations may be updated based on patient status, additional functional criteria and insurance authorization.  Follow Up Recommendations  Can patient physically be transported by private vehicle: No    Assistance Recommended at Discharge    Patient can return home with the following     Equipment Recommendations       Recommendations for Other Services       Precautions / Restrictions Precautions Precautions: Fall Precaution Comments: posterior lean Restrictions Weight Bearing Restrictions: No     Mobility  Bed Mobility               General bed mobility comments: up in chair    Transfers Overall transfer level: Needs assistance Equipment used: Rolling walker (2 wheels), 1 person hand held assist Transfers: Sit to/from Stand Sit to Stand: Total assist            General transfer comment: feet slide and pt is completely unattentive to his LE's    Ambulation/Gait               General Gait Details: unable to try   Stairs             Wheelchair Mobility    Modified Rankin (Stroke Patients Only)       Balance Overall balance assessment: Needs assistance Sitting-balance support: Feet supported, Bilateral upper extremity supported Sitting balance-Leahy Scale: Poor Sitting balance - Comments: strong posterior lean and unaware of his posture and needs for assistance. Postural control: Posterior lean                                  Cognition Arousal/Alertness: Awake/alert Behavior During Therapy: Flat affect Overall Cognitive Status: Difficult to assess Area of Impairment: Problem solving, Awareness, Safety/judgement, Following commands, Attention                 Orientation Level: Situation, Time Current Attention Level: Selective Memory: Decreased recall of precautions, Decreased short-term memory Following Commands: Follows one step commands inconsistently, Follows one step commands with increased time Safety/Judgement: Decreased awareness of safety, Decreased awareness of deficits Awareness: Intellectual Problem Solving: Slow processing, Requires verbal cues, Requires tactile cues General Comments: not speaking to PT        Exercises General  Exercises - Lower Extremity Ankle Circles/Pumps: AROM, AAROM, 5 reps Long Arc Quad: AAROM, 10 reps Heel Slides: AAROM, 10 reps Hip ABduction/ADduction: AROM, AAROM, Left    General Comments General comments (skin integrity, edema, etc.): Pt was seen for mobility on side of bed and could not stand up to take a step.  Pt is unsafe and has no awareness of how to correct his posture in space      Pertinent Vitals/Pain Pain Assessment Pain Intervention(s): Monitored during session, Repositioned    Home Living                           Prior Function            PT Goals (current goals can now be found in the care plan section) Acute Rehab PT Goals Patient Stated Goal: unable to state Progress towards PT goals: Not progressing toward goals - comment    Frequency    Min 3X/week      PT Plan Current plan remains appropriate;Frequency needs to be updated    Co-evaluation              AM-PAC PT "6 Clicks" Mobility   Outcome Measure  Help needed turning from your back to your side while in a flat bed without using bedrails?: A Lot Help needed moving from lying on your back to sitting on the side of a flat bed without using bedrails?: A Lot Help needed moving to and from a bed to a chair (including a wheelchair)?: A Lot Help needed standing up from a chair using your arms (e.g., wheelchair or bedside chair)?: A Lot Help needed to walk in hospital room?: Total Help needed climbing 3-5 steps with a railing? : Total 6 Click Score: 10    End of Session Equipment Utilized During Treatment: Gait belt Activity Tolerance: Patient limited by fatigue Patient left: in chair;with chair alarm set;with call bell/phone within reach;with family/visitor present Nurse Communication: Mobility status PT Visit Diagnosis: Unsteadiness on feet (R26.81);Muscle weakness (generalized) (M62.81)     Time: 1610-9604 PT Time Calculation (min) (ACUTE ONLY): 16 min  Charges:  $Therapeutic Exercise: 8-22 mins         Ivar Drape 10/12/2022, 3:12 PM  Samul Dada, PT PhD Acute Rehab Dept. Number: [facility] [id number] and [facility] [phone]

## 2022-10-12 NOTE — Progress Notes (Signed)
[facility]  Date of Admission:  09/28/2022           Reason for visit: Follow up on possible meningitis  ASSESSMENT:    72 y.o. male admitted with:   Progressive subacute encephalopathy with questionable fungal meningitis vs hydrocephalus vs other: Patient underwent repeat LP yesterday with elevated opening pressure again of 34 cm H2O.  He has been on Amphotericin since 10/04/2022.  Initially there was some improvement that was thought to be secondary to this, however, would question if his improvement was more so related to relief of his intracranial pressure of unknown etiology.  His repeat CSF cell count was elevated at 29 WBCs, however, only 1% neutrophils.  Meningitis/encephalitis panel was negative and overall was not consistent with bacterial meningitis.  Multiple send out labs remain pending at this time with no conclusive diagnosis as of yet.  However, I am less convinced that this is representative of a fungal meningitis based on the workup we have so far.  Additionally, his Fungitell was negative.  Again, hopefully the send out labs will be informative and we should have the Karius test back later today.  Interestingly, he was more engaged with certain staff members then with our team.  Unclear the etiology of this type of interaction but seems to be selective with who he will talk to and engage with at times.    Lumbar puncture 10/04/2022: Opening pressure 36, closing pressure 10.  Only able to get 15 mL CSF cytology--no malignant cells CSF cultures--negative Crypto antigen--negative Meningitis encephalitis panel--negative Blastomyces--negative CSF VDRL--negative MTB/RIF--pending Coccidioides--pending Histo--pending   Lumbar puncture 10/11/22: Opening pressure 34, closing pressure 9.  Removal of 17.5 mL of clear CSF CSF cytology--pending CSF pathology--pending Meningitis encephalitis panel--negative CSF VDRL--pending CSF paraneoplastic  panel--pending CSF autoimmune encephalitis panel--pending CSF oligoclonal bands--pending CSF cultures--pending  Serologic workup: QuantiFERON--negative Fungitell--negative Karius test--pending Paraneoplastic panel--pending Autoimmune encephalitis panel--pending  RECOMMENDATIONS:    Continue Amphotericin with pharmacy assistance pending send out fungal labs and Karius test Stop vancomycin, stop ampicillin Okay to continue ceftriaxone for now Appreciate neurology follow-up Await send out LP labs Will follow. Dr Elinor Parkinson here this weekend   Principal Problem:   Suspected fungal meningitis Active Problems:   Chronic pain syndrome   Depression   Anxiety   Benign essential hypertension   Diabetes mellitus type 2, controlled, without complications (HCC)   Hyperlipidemia   Acute kidney injury (HCC)   Progressive subacute encephalopathy   Folate deficiency   Transaminitis   Obesity (BMI 30-39.9)   Hypokalemia   Dysphagia   Vomiting    MEDICATIONS:    Scheduled Meds:  amLODipine  10 mg Oral Daily   dextrose  10 mL Intravenous Q24H   dextrose  10 mL Intravenous Q24H   folic acid  1 mg Oral Daily   insulin aspart  0-15 Units Subcutaneous TID WC   insulin aspart  0-5 Units Subcutaneous QHS   insulin glargine-yfgn  30 Units Subcutaneous Daily   lidocaine (PF)  5 mL Other Once   lipase/protease/amylase  36,000 Units Oral TID AC   LORazepam  1-2 mg Intravenous Once   LORazepam  0.5 mg Oral QHS   methadone  10 mg Oral TID   potassium chloride  40 mEq Oral QID   sodium chloride  500 mL Intravenous Q24H   sodium chloride  500 mL Intravenous Q24H   sodium chloride flush  3 mL Intravenous Q12H   Continuous Infusions:  amphotericin B liposome (AMBISOME) 500 mg in dextrose 5 % 500 mL IVPB 500 mg (10/11/22 1704)   cefTRIAXone (ROCEPHIN)  IV 2 g (10/12/22 0945)   magnesium sulfate bolus IVPB     PRN Meds:.acetaminophen, acetaminophen, diphenhydrAMINE **OR** diphenhydrAMINE,  ondansetron **OR** ondansetron (ZOFRAN) IV, senna-docusate  SUBJECTIVE:   24 hour events:  Status post repeat LP with opening pressure 34 and closing pressure 9 Meningitis/encephalitis panel negative again Cell count 2938% lymphocytes, 51% monocytes Glucose 43, protein 272 Cytology and miscellaneous send out labs pending Patient started on empiric meningitis antibiotics last night 9 no fevers Fungitell negative Creatinine stable Potassium stable Magnesium stable  Patient was seen this morning.  He is sitting up in the chair, watching television.  He responds hello when greeted.  But he otherwise did not respond to our questioning.  We subsequently walked out of the room and talked with his nurse.  She seemed surprised that he would not talk to [place].  She went back in the room and started talking to him at which time he formed a complete sentence and talked to her.  We then went back in the room and he did not talk to [place].  Upon leaving the room.  I said "have a nice day".  He responded "same to you."  Review of Systems  All other systems reviewed and are negative.     OBJECTIVE:   Blood pressure (!) 141/96, pulse 88, temperature 97.8 F (36.6 C), resp. rate 16, height 5\' 10"  (1.778 m), weight 102.1 kg, SpO2 100 %. Body mass index is 32.28 kg/m.  Physical Exam Constitutional:      General: He is not in acute distress. HENT:     Head: Normocephalic and atraumatic.  Eyes:     Extraocular Movements: Extraocular movements intact.     Conjunctiva/sclera: Conjunctivae normal.  Abdominal:     General: There is no distension.     Palpations: Abdomen is soft.  Neurological:     General: No focal deficit present.     Mental Status: He is alert.      Lab Results: Lab Results  Component Value Date   WBC 15.5 (H) 10/12/2022   HGB 14.4 10/12/2022   HCT 43.1 10/12/2022   MCV 84.0 10/12/2022   PLT 250 10/12/2022    Lab Results  Component Value Date   NA 142 10/12/2022   K 3.9  10/12/2022   CO2 22 10/12/2022   GLUCOSE 173 (H) 10/12/2022   BUN 22 10/12/2022   CREATININE 1.35 (H) 10/12/2022   CALCIUM 9.6 10/12/2022   GFRNONAA 56 (L) 10/12/2022   GFRAA >60 10/11/2015    Lab Results  Component Value Date   ALT 47 (H) 10/12/2022   AST 30 10/12/2022   ALKPHOS 73 10/12/2022   BILITOT 0.7 10/12/2022    No results found for: "CRP"  No results found for: "ESRSEDRATE"   I have reviewed the micro and lab results in Epic.  Imaging: DG FL GUIDED LUMBAR PUNCTURE  Result Date: 10/11/2022 CLINICAL DATA:  Patient with suspected fungal meningitis. Request made for diagnostic lumbar puncture. EXAM: DIAGNOSTIC LUMBAR PUNCTURE UNDER FLUOROSCOPIC GUIDANCE COMPARISON:  DG fluoro guided lumbar puncture 10/03/2022. FLUOROSCOPY: Radiation Exposure Index (as provided by the fluoroscopic device): 1.10 mGy Kerma PROCEDURE: Informed consent was obtained from the patient's son prior to the procedure, including potential complications of headache, allergy, and pain. With the patient prone, the lower back was prepped with Betadine. 1% Lidocaine was used for local anesthesia.  Lumbar puncture was performed at the L2-L3 level using a 20 gauge needle with return of clear CSF with an opening pressure of 34 cm water. 17.5 ml of CSF were obtained for laboratory studies. Closing pressure of 9 cm water. The patient tolerated the procedure well and there were no apparent complications. Study was performed by Alwyn Ren, NP, under the supervision of Carey Bullocks, MD. IMPRESSION: Technically successful L2-L3 lumbar puncture yielding 17.5 mL clear CSF for laboratory studies. Read by: Alwyn Ren, NP Electronically Signed   By: Carey Bullocks M.D.   On: 10/11/2022 13:57     Imaging independently reviewed in Epic.    Vedia Coffer for Infectious Disease [facility] [phone] pager 10/12/2022, 11:52 AM  I have personally spent 50 minutes involved in  face-to-face and non-face-to-face activities for this patient on the day of the visit. Professional time spent includes the following activities: Preparing to see the patient (review of tests), Obtaining and/or reviewing separately obtained history (admission/discharge record), Performing a medically appropriate examination and/or evaluation , Ordering medications/tests/procedures, referring and communicating with other health care professionals, Documenting clinical information in the EMR, Independently interpreting results (not separately reported), Communicating results to the patient/family/caregiver, Counseling and educating the patient/family/caregiver and Care coordination (not separately reported).

## 2022-10-12 NOTE — Progress Notes (Signed)
LTM EEG hooked up and running - no initial skin breakdown - push button tested - [facility] monitoring per [facility].

## 2022-10-12 NOTE — Progress Notes (Signed)
NEUROLOGY CONSULTATION PROGRESS NOTE   Date of service: October 12, 2022 Patient Name: Jeremy Medina. MRN:  [id number] DOB:  09-19-1950  Brief HPI  72 year old male with progressively worsening cognition, gait changes with falls, and urinary incontinence x 3 months. Had normal functioning baseline prior to January. Admitted 09/28/22. PMH: chronic pain, depression, anxiety, HTN, DM2, HLD, folate deficiency, PTSD, hx of tobacco and alcohol abuse (quit drinking in his 30's; current smoker; 54 pack years), IDA, and lung nodules.  Interval Hx   Patient is seen in his room with no family at the bedside.  He will vocalize in response to questions, sometimes repeats words that are said to him but is unable to state his name or location.  He will intermittently follow commands and tracks the examiner around the room.  High-volume LP was performed yesterday.  Vitals   Vitals:   10/11/22 1517 10/11/22 1929 10/12/22 0407 10/12/22 0726  BP: 139/88 (!) 153/93 (!) 153/91 (!) 124/98  Pulse: 90 89 90 88  Resp:  16  16  Temp: 98 F (36.7 C) 98 F (36.7 C) 98.3 F (36.8 C) 97.8 F (36.6 C)  TempSrc: Oral Tympanic Tympanic   SpO2: 95% 97% 98% 100%  Weight:      Height:         Body mass index is 32.28 kg/m.  Physical Exam   General: Well-nourished, well-developed 72-year-old patient lying in bed in no acute distress HENT: Normal oropharynx and mucosa. Normal external appearance of ears and nose.  Neck: Supple, no pain or tenderness  CV: No JVD. No peripheral edema.  Pulmonary: Symmetric Chest rise. Normal respiratory effort.  Abdomen: Soft to touch, non-tender.  Ext: No cyanosis, edema, or deformity  Skin: No rash. Normal palpation of skin.   Musculoskeletal: Normal digits and nails by inspection. No clubbing.   Neurologic Examination  Mental status/Cognition: Alert, makes eye contact. Intermittently follows 1 step commands. Speech/language: Makes sounds and speaks single words, sometimes  repeating what was said to him.  Unable to answer questions  Cranial nerves:   CN II Pupils equal and reactive to light   CN III,IV,VI EOM intact, no gaze preference or deviation, no nystagmus   CN V normal sensation in V1, V2, and V3 segments bilaterally   CN VII Face symmetrical at rest and when speaking   CN VIII Makes eye contact to speech   CN IX & X Protecting airway but would not open his mouth on command   CN XI Head midline.   CN XII Does not protrude tongue on command.   Motor:  Muscle bulk: normal, tone normal Moves all extremities spontaneously and purposefully.  Moves bilateral hands when asked to give a thumbs up but unable to make that gesture.  Will wiggle toes to command, withdraws all extremities to pinch  Sensation:  Light touch    Pin prick Localizes to pinch in all extremities.   Temperature    Vibration   Proprioception    Coordination/Complex Motor:  Unable to assess.  Labs   Basic Metabolic Panel:  Lab Results  Component Value Date   NA 140 10/11/2022   K 3.2 (L) 10/11/2022   CO2 25 10/11/2022   GLUCOSE 167 (H) 10/11/2022   BUN 22 10/11/2022   CREATININE 1.29 (H) 10/11/2022   CALCIUM 10.0 10/11/2022   GFRNONAA 60 (L) 10/11/2022   GFRAA >60 10/11/2015   HbA1c:  Lab Results  Component Value Date   HGBA1C 9.7 (H) 09/13/2022  LDL: No results found for: "LDLCALC" Urine Drug Screen:     Component Value Date/Time   LABOPIA NONE DETECTED 09/14/2022 0357   COCAINSCRNUR NONE DETECTED 09/14/2022 0357   COCAINSCRNUR NEG 10/19/2015 1332   LABBENZ NONE DETECTED 09/14/2022 0357   AMPHETMU NONE DETECTED 09/14/2022 0357   THCU NONE DETECTED 09/14/2022 0357   LABBARB NONE DETECTED 09/14/2022 0357    Alcohol Level     Component Value Date/Time   ETH <10 09/28/2022 1535   No results found for: "PHENYTOIN", "ZONISAMIDE", "LAMOTRIGINE", "LEVETIRACETA" No results found for: "PHENYTOIN", "PHENOBARB", "VALPROATE", "CBMZ"  Imaging and Diagnostic  studies   Pertinent Labs: -Cryptococcal antigen from 10/03/2022 negative -Cytology for from 10/03/2022 negative for malignancy with lymphocyte predominant inflammation present -CSF from 10/03/2022 with elevated WBCs at 60, RBCs 18, monocyte predominant, remainder lymphocytes, no neutrophils. -CSF culture with no growth x 3 days. -CSF from 4/25 with 29 WBCs, 1 RBC xanthochromic -Meningitis/encephalitis panel 4/25 negative -CSF culture from 4/25 pending -Protein elevated in CSF at 365, glucose normal at 67 -Fungus culture negative for 8 days -Meningitis panel negative -HIV negative -VDRL CSF nonreactive -Blastomyces antigen pending -MT-RIF NAA w/ culture pending -Karius pending -QuantiFERON-TB gold send out pending  Lumbar puncture on 10/03/2022 in interventional radiology -Opening pressure elevated at 36 cm H2O, closing pressure 10 cm H2O -15 mL of clear, straw-colored CSF obtained before flow halted  Lumbar puncture 4/25 in interventional radiology -Opening pressure of 34 cm H2O -17.5 mL of clear CSF obtained  EEG 10/02/22 Findings consistent with nonspecific encephalopathy  MRI Brain w/o C 09/14/22: 1. No acute intracranial abnormality. 2. Chronic white matter changes, most commonly due to chronic small vessel ischemia.  MRVenogram head: Negative for dural venous sinus thrombosis.  Impression   Gerrald Basu. is a 72 year old male with subacute decline over 3-4 months with imaging demonstrating communicating hydrocephalus. Initial LP with elevated opening pressure, improved after LP and ?further after amphotericin B and being treated at this time for presumptive fungal meningitis. Repeat LP again with elevated opening pressure.  Unclear if he is selective about who he truly engages with or has plateaued on exam.  Recommendations  - Continue to avoid sedating and deliriogenic medications.  - antimicrobials per ID team. - CSF histomyces antigen, blastomyces ag, cocci  antibodies, MTB PCR, Karius test, serum Coccidioides antibodies by CF and ID, urine histoplasma ag, blastomyces ag. Quantiferon gold, ACE level have been ordered by ID.  - Fungal cultures are pending. Of note, fungal cultures for the above organisms are low yield. ID has asked the lab to incubate cultures for 6 weeks Initially felt to have NPH. Unlikely NPH given elevated opening pressure. - If the family feels like he has plateaued, will consider IV solumedrol. - CSF and serum autoimmune encephalopathy panel is pending. - If no improvement despite steroids, will reach out to neurosurgery to consider shunt placement for persistently elevated ICPs. - Recommend up in chair with safety measures as able. - PT/OT as exam continues to improve.  ______________________________________________________________________   Patient seen and examined by NP/APP with MD. MD to update note as needed.   Cortney E Ernestina Columbia , MSN, AGACNP-BC [facility] See Amion for schedule and pager information 10/12/2022 8:21 AM   NEUROHOSPITALIST ADDENDUM Performed a face to face diagnostic evaluation.   I have reviewed the contents of history and physical exam as documented by PA/ARNP/Resident and agree with above documentation.  I have discussed and formulated the above plan as documented.  Edits to the note have been made as needed.  Impression/Key exam findings/Plan: did not engage much with me during exam. However, engages more with other staff members. Son to come in today around 5pm to see if he has improved since he saw him 2 days ago.  ID feels fungal meningitis is less likely now based on results so far. If exam truly has plateaued and family feels that way too, will do IV solumedrol 1000mg  daily x 5 doses after clearance from ID. CSF and serum Autoimmune encephalitis panel pending. If no significant improvement with steroids, will recommend discussing with neurosurgery and considering  shunt.  Erick Blinks, MD [facility] [phone]   If 7pm to 7am, please call on call as listed on AMION.

## 2022-10-12 NOTE — Progress Notes (Addendum)
Progress Note   Patient: Jeremy Medina. ZOX:[id number] DOB: 1951-02-23 DOA: 09/28/2022     10 DOS: the patient was seen and examined on 10/12/2022 at 9:15AM   Brief hospital course: Mr. Amear Strojny. is a 72 y.o. M with hx HTN, DM, chronic pain on methadone, PTSD/depression/anxiety on daily benzodiazepine who presented with concern for NPH.  Evidently recently admitted at [facility] for subacute encephalopathy (beginning ~Jan, 3 months PTA).  Work up there concerning for NPH, discharged to home with Neurology and Neurosurgery follow ups.  On day of admission, was seen in Neurosurgery clinic and sent to the ER for LP.  Incidentally found in the ER to have AKI Cr 2.3.    4/12: Admitted 4/14: Cr normalized, persistent encephalopathy noted 4/15: Neurology consulted 4/17: LP obtained 4/18: ID consulted, amphotericin started 4/19: Patient more alert 4/20-4/24--intermittent waxing waning mentation 4/25 immediately post LP more lucid-conversant short sentences---Fungal meningitis Etiology now questioned--LP shows 29 WBC on champagne tap---Broadened Abx coverage 4/26.  Broad coverage was discontinued by ID-still awaiting labs-long-term EEG planned for tonight    Assessment and Plan: * Suspected fungal meningitis Progressive subacute encephalopathy Progressive since January-had been seen by 7 urgent care visits and several visits to outpatient and felt to have normal pressure hydrocephalus at the [place] and finally was admitted here from neurosurgery office with concerns for alternate causes and none could be found--- initially nonverbal, during first several days in hospital.   Neuraxial imaging with CT head and MRV showed no acute process.   No improvement with IV thiamine.   Neurology consulted, LP obtained that showed elevated protein and WBC.  No substantial improvement with initial LP, Neurology suspected fungal meningitis.   LP performed again 4/25--34 cm water opening pressure patient evaluated  subsequent to procedure and mentation improved he was verbalizing more  felt by neurology to be secondary to relief of CSF pressure - Continue amphotericin-no further antimicrobials -May be long-term EEG is appropriate to rule out underlying seizures? -Currently awaiting oligoclonal bands, fungal culture, VDRL, cytology, encephalitis panel--initial CSF culture and Gram stain shows no growth below 24 hours no organisms and predominantly mononuclear WBCs  Vomiting Does not seem to be present now-episodic in nature only on 4/25-would only trend Prior US abdomen unremarkable, some fatty liver, presumably NASH.   Transaminitis Mild.  Hepatitis serologies negative.  Probably NASH given normal [place]. -Monitor trends  Diabetes mellitus type 2, controlled, without complications Glucose elevated  - Continue sliding scale corrections, CBGs 150-245 Continue glargine to 30 units at bedtime [Amphotericin is being given in D5 which may be rising the sugars] - Hold home 70/30 - Hold home Crestor  Benign essential hypertension BP elevated  - Continue amlodipine 10mg  - Resume home Lisinopril at time of discharge versus discontinue completely given azotemia  Chronic pain syndrome - Continue home methadone 10 3 times daily-if no improvement would probably drop the dose to 5 3 times daily and check--[extreme long half-life which builds up in the system] Meperidine diiscontinued 4/24  Hypokalemia Expected with Ampho - Supplement K, magnesium per RPh-appreciate input  Obesity (BMI 30-39.9) BMI 32  Folate deficiency B12 deficiency ruled out - Continue folate supplement  Acute kidney injury Creatinine 2.3 on admission.  US renal unremarkable.   Watch kidney function closely while on Amphotericin Hold lisinopril, start NS 50 cc/H and monitor chemistries carefully given on Amphotericin and vancomycin currently  Hyperlipidemia - Hold home Crestor  Anxiety - Continue home Ativan, dose  minimized  Depression Not on  medication at home    Subjective:   Remains disoriented Does not seem to be in distress Note behavioral peculiarities with regards to him being more responsive to certain members of staff including the nurses less responsive to this examiner as well as the neurologist and infectious disease specialist that saw the patient today Unclear if this is just a behavior or if this could represent some other underlying issue I cannot get further review of systems from him   Physical Exam: BP (!) 141/96 (BP Location: Right Arm)   Pulse 88   Temp 97.8 F (36.6 C)   Resp 16   Ht 5\' 10"  (1.778 m)   Wt 102.1 kg   SpO2 100%   BMI 32.28 kg/m    Awake and coherent and closes his eyes when I examined him- Cta b no added sound Abd soft no rebound no guarding seems relatively soft Further motor examination deferred as he is sleepy and does not seem to want to be disturbed   Family Communication: long discussion with son on 4/24-will rediscuss with patient Disposition: Status is: Inpatient The patient presented with subacute encephalopathy  ?  Fungal meningitis?  NPH?  Other--- follow recommendations of specialists-no clear discernible endpoint insight      Author: Rhetta Mura, MD 10/12/2022 4:28 PM  For on call review [email or web address].

## 2022-10-12 NOTE — Progress Notes (Signed)
Pharmacy Anti-infective Note  Jeremy Medina. is a 72 y.o. male admitted on 09/28/2022 with AMS/encephalopathy and concern for meningitis. LP done on 4/17 shows 40% CSF/serum glucose ratio, elevated protein with concern for fungal PNA. Crypto neg, additional testing sent for coccidioides, histoplasmosis, blastomyces, TB. Pharmacy has been consulted for empiric liposomal amphotericin B dosing.  The patient was noted to have AKI on admission - resolved and down to 0.97 prior to starting ampho B (2.2 on admission).   SCr stable at 1.35 << 1.29. Continue pre/post NS boluses w/ D5W flushes.   K 3.9 << 3.2 (s/p 220 mEq yesterday) -  will continue to need aggressive repletion so will keep on scheduled maintenance of 160 mEq/day for now, Mg 1.8 << 1.7 s/p Mg 4g yesterday.   The fluid, flush, and medication schedule is below:  Sodium Chloride bolus over an hour at ~1400 Dex 5% flush at ~1500  Ampho dose due at ~1600 daily  Dex 5% flush at ~1700  Sodium Chloride over an hour at ~1700  Plan: - Continue Liposomal Amphotericin B 500 mg (~5 mg/kg) IV every 24 hours - Pre/post NS fluid boluses of 500 cc + D5W flushes before/after drug administration - Magnesium 6g IV x 1 dose  - Continue scheduled KCl 40 mEq packets (dysphagia diet) to 4x/day for now for maintenance - will monitor for need to adjust and/or replete more - Will monitor lytes and renal function closely - daily BMET + Mg - Will follow-up on additional testing to determine duration of therapy   Height: 5\' 10"  (177.8 cm) Weight: 102.1 kg (225 lb) IBW/kg (Calculated) : 73  Temp (24hrs), Avg:98 F (36.7 C), Min:97.8 F (36.6 C), Max:98.3 F (36.8 C)  Recent Labs  Lab 10/08/22 0405 10/09/22 0440 10/10/22 0415 10/11/22 0535 10/12/22 0856  WBC 9.9 11.6* 13.7* 13.0* 15.5*  CREATININE 1.10 0.99 1.31* 1.29* 1.35*     Estimated Creatinine Clearance: 60.9 mL/min (A) (by C-G formula based on SCr of 1.35 mg/dL (H)).     Allergies  Allergen Reactions   Bee Venom Anaphylaxis    Antimicrobials this admission: Ampho B >>  Dose adjustments this admission:   Microbiology results: 4/12 BCx >> ngx5d 4/17 CSF cx >> ng<24h  Thank you for allowing pharmacy to be a part of this patient's care.  Georgina Pillion, PharmD, BCPS Infectious Diseases Clinical Pharmacist 10/12/2022 11:10 AM   **Pharmacist phone directory can now be found on [email or web address] ([email or web address]).  Listed under [facility].

## 2022-10-12 NOTE — Plan of Care (Signed)
Patient is sitting in recliner watching tv. Vitals are within normal limits. All meds have been given and tolerated well. Patient tends to inconsistently respond to verbal actions. Safety precautions are in place. Plan of care is ongoing.  Problem: Education: Goal: Ability to describe self-care measures that may prevent or decrease complications (Diabetes Survival Skills Education) will improve Outcome: Progressing Goal: Individualized Educational Video(s) Outcome: Progressing   Problem: Coping: Goal: Ability to adjust to condition or change in health will improve Outcome: Progressing   Problem: Fluid Volume: Goal: Ability to maintain a balanced intake and output will improve Outcome: Progressing   Problem: Health Behavior/Discharge Planning: Goal: Ability to identify and utilize available resources and services will improve Outcome: Progressing Goal: Ability to manage health-related needs will improve Outcome: Progressing   Problem: Metabolic: Goal: Ability to maintain appropriate glucose levels will improve Outcome: Progressing   Problem: Nutritional: Goal: Maintenance of adequate nutrition will improve Outcome: Progressing Goal: Progress toward achieving an optimal weight will improve Outcome: Progressing   Problem: Skin Integrity: Goal: Risk for impaired skin integrity will decrease Outcome: Progressing   Problem: Tissue Perfusion: Goal: Adequacy of tissue perfusion will improve Outcome: Progressing   Problem: Education: Goal: Knowledge of General Education information will improve Description: Including pain rating scale, medication(s)/side effects and non-pharmacologic comfort measures Outcome: Progressing   Problem: Health Behavior/Discharge Planning: Goal: Ability to manage health-related needs will improve Outcome: Progressing   Problem: Clinical Measurements: Goal: Ability to maintain clinical measurements within normal limits will improve Outcome:  Progressing Goal: Will remain free from infection Outcome: Progressing Goal: Diagnostic test results will improve Outcome: Progressing Goal: Respiratory complications will improve Outcome: Progressing Goal: Cardiovascular complication will be avoided Outcome: Progressing   Problem: Activity: Goal: Risk for activity intolerance will decrease Outcome: Progressing   Problem: Nutrition: Goal: Adequate nutrition will be maintained Outcome: Progressing   Problem: Coping: Goal: Level of anxiety will decrease Outcome: Progressing   Problem: Elimination: Goal: Will not experience complications related to bowel motility Outcome: Progressing Goal: Will not experience complications related to urinary retention Outcome: Progressing   Problem: Pain Managment: Goal: General experience of comfort will improve Outcome: Progressing   Problem: Safety: Goal: Ability to remain free from injury will improve Outcome: Progressing   Problem: Skin Integrity: Goal: Risk for impaired skin integrity will decrease Outcome: Progressing

## 2022-10-12 NOTE — Progress Notes (Signed)
Occupational Therapy Treatment Patient Details Name: Jeremy Medina. MRN: [id number] DOB: 04-Feb-1951 Today's Date: 10/12/2022   History of present illness Jeremy Medina. is a 72 y.o. male who presents complaining of altered mental status and progressive decline.  MRI performed at [facility], concern for hydrocephalus and neurosurgeon recommended pt present to ED. Work-up pending. LP 10/03/2022. PMHx: type 2 diabetes, PTSD, chronic pain on methadone per son, hypertension, hyperlipidemia   OT comments  Patient received in supine and was alert. Patient required max assist to get to EOB with hand over hand to use rail with limited participation from patient and assistance provided with BLEs and trunk. Patient demonstrated posterior leaning and was able to manage with use of RW. Patient required feet blocked to stand from raised bed and max assist for transfer to recliner. Face hygiene and combing hair performed seated in recliner with hand over hand to initiate and assistance to complete. Patient stood at sink for oral care with posterior and right lateral leaning. Patient will benefit from continued inpatient follow up therapy, <3 hours/day to address cognition, self care, and functional transfers.    Recommendations for follow up therapy are one component of a multi-disciplinary discharge planning process, led by the attending physician.  Recommendations may be updated based on patient status, additional functional criteria and insurance authorization.    Assistance Recommended at Discharge Frequent or constant Supervision/Assistance  Patient can return home with the following  A lot of help with walking and/or transfers;Two people to help with walking and/or transfers;A lot of help with bathing/dressing/bathroom;Two people to help with bathing/dressing/bathroom;Direct supervision/assist for medications management;Direct supervision/assist for financial management;Assist for transportation;Help with  stairs or ramp for entrance;Assistance with feeding;Assistance with cooking/housework   Equipment Recommendations  None recommended by OT    Recommendations for Other Services      Precautions / Restrictions Precautions Precautions: Fall Precaution Comments: posterior lean Restrictions Weight Bearing Restrictions: No       Mobility Bed Mobility Overal bed mobility: Needs Assistance Bed Mobility: Supine to Sit     Supine to sit: Max assist     General bed mobility comments: assistance with trunk and BLEs with hand over hand to use rail to assist with limited progress    Transfers Overall transfer level: Needs assistance Equipment used: Rolling walker (2 wheels) Transfers: Sit to/from Stand, Bed to chair/wheelchair/BSC Sit to Stand: Mod assist, From elevated surface     Step pivot transfers: Max assist     General transfer comment: required feet blocked to avoid sliding forward, mod assist ot stand from raised bed and max assist from recliner. Max assist for step pivot transfer with cues throughout and assistance with guiding hips and walker management     Balance Overall balance assessment: Needs assistance Sitting-balance support: Feet supported, Bilateral upper extremity supported Sitting balance-Leahy Scale: Poor Sitting balance - Comments: posterior leaning on EOB, able to use walker to assist with trunk flexion Postural control: Posterior lean Standing balance support: Bilateral upper extremity supported, Single extremity supported, During functional activity Standing balance-Leahy Scale: Poor Standing balance comment: BUE support for transfer, stood at sink for oral care with one extremity support                           ADL either performed or assessed with clinical judgement   ADL Overall ADL's : Needs assistance/impaired     Grooming: Wash/dry hands;Wash/dry face;Oral care;Brushing hair;Moderate assistance;Maximal assistance Grooming  Details (indicate cue type and reason): mod to max assist to perform grooming tasks with hand over hand to initate and cues to stay on tasks. Patient required assistance to complete tasks.                               General ADL Comments: difficulty following commands    Extremity/Trunk Assessment              Vision       Perception     Praxis      Cognition Arousal/Alertness: Awake/alert Behavior During Therapy: Flat affect Overall Cognitive Status: Difficult to assess Area of Impairment: Orientation, Attention, Memory, Following commands, Safety/judgement, Awareness, Problem solving                 Orientation Level: Disoriented to, Person, Place, Time, Situation Current Attention Level: Focused (easily distracted)   Following Commands: Follows one step commands inconsistently Safety/Judgement: Decreased awareness of safety, Decreased awareness of deficits Awareness: Intellectual Problem Solving: Slow processing, Decreased initiation, Difficulty sequencing, Requires verbal cues General Comments: limited vocalization, only stated, "your hands are cold" during visit. Hand over hand to initate most tasks        Exercises      Shoulder Instructions       General Comments      Pertinent Vitals/ Pain       Pain Assessment Pain Assessment: Faces Faces Pain Scale: No hurt Pain Intervention(s): Monitored during session  Home Living                                          Prior Functioning/Environment              Frequency  Min 1X/week        Progress Toward Goals  OT Goals(current goals can now be found in the care plan section)  Progress towards OT goals: Progressing toward goals  Acute Rehab OT Goals OT Goal Formulation: Patient unable to participate in goal setting Time For Goal Achievement: 10/13/22 Potential to Achieve Goals: Good ADL Goals Pt Will Perform Grooming: with mod assist;sitting Pt Will  Perform Upper Body Dressing: with mod assist;sitting Pt Will Transfer to Toilet: with mod assist;bedside commode;stand pivot transfer Additional ADL Goal #1: Pt will follow simple 1 step commands 50% of the time to complete ADL task  Plan Discharge plan remains appropriate    Co-evaluation                 AM-PAC OT "6 Clicks" Daily Activity     Outcome Measure   Help from another person eating meals?: Total Help from another person taking care of personal grooming?: A Lot Help from another person toileting, which includes using toliet, bedpan, or urinal?: A Lot Help from another person bathing (including washing, rinsing, drying)?: A Lot Help from another person to put on and taking off regular upper body clothing?: A Lot Help from another person to put on and taking off regular lower body clothing?: A Lot 6 Click Score: 11    End of Session Equipment Utilized During Treatment: Gait belt;Rolling walker (2 wheels)  OT Visit Diagnosis: Unsteadiness on feet (R26.81);Other abnormalities of gait and mobility (R26.89);Muscle weakness (generalized) (M62.81);Pain   Activity Tolerance Patient tolerated treatment well   Patient Left in chair;with call bell/phone within reach;with chair  alarm set   Nurse Communication Mobility status        Time: 1610-9604 OT Time Calculation (min): 26 min  Charges: OT General Charges $OT Visit: 1 Visit OT Treatments $Self Care/Home Management : 23-37 mins  Alfonse Flavors, OTA Acute Rehabilitation Services  Office [phone]   Dewain Penning 10/12/2022, 8:54 AM

## 2022-10-13 ENCOUNTER — Inpatient Hospital Stay (HOSPITAL_COMMUNITY): Payer: No Typology Code available for payment source

## 2022-10-13 DIAGNOSIS — E785 Hyperlipidemia, unspecified: Secondary | ICD-10-CM | POA: Diagnosis not present

## 2022-10-13 DIAGNOSIS — R4182 Altered mental status, unspecified: Secondary | ICD-10-CM | POA: Diagnosis not present

## 2022-10-13 DIAGNOSIS — R569 Unspecified convulsions: Secondary | ICD-10-CM | POA: Diagnosis not present

## 2022-10-13 DIAGNOSIS — G030 Nonpyogenic meningitis: Secondary | ICD-10-CM | POA: Diagnosis not present

## 2022-10-13 DIAGNOSIS — E119 Type 2 diabetes mellitus without complications: Secondary | ICD-10-CM | POA: Diagnosis not present

## 2022-10-13 DIAGNOSIS — G9340 Encephalopathy, unspecified: Secondary | ICD-10-CM | POA: Diagnosis not present

## 2022-10-13 LAB — COMPREHENSIVE METABOLIC PANEL
ALT: 39 U/L (ref 0–44)
AST: 24 U/L (ref 15–41)
Albumin: 3 g/dL — ABNORMAL LOW (ref 3.5–5.0)
Alkaline Phosphatase: 67 U/L (ref 38–126)
Anion gap: 13 (ref 5–15)
BUN: 25 mg/dL — ABNORMAL HIGH (ref 8–23)
CO2: 21 mmol/L — ABNORMAL LOW (ref 22–32)
Calcium: 9.7 mg/dL (ref 8.9–10.3)
Chloride: 105 mmol/L (ref 98–111)
Creatinine, Ser: 1.47 mg/dL — ABNORMAL HIGH (ref 0.61–1.24)
GFR, Estimated: 51 mL/min — ABNORMAL LOW (ref >60)
Glucose, Bld: 153 mg/dL — ABNORMAL HIGH (ref 70–99)
Potassium: 3.4 mmol/L — ABNORMAL LOW (ref 3.5–5.1)
Sodium: 139 mmol/L (ref 135–145)
Total Bilirubin: 0.6 mg/dL (ref 0.3–1.2)
Total Protein: 6.3 g/dL — ABNORMAL LOW (ref 6.5–8.1)

## 2022-10-13 LAB — GLUCOSE, CAPILLARY
Glucose-Capillary: 188 mg/dL — ABNORMAL HIGH (ref 70–99)
Glucose-Capillary: 241 mg/dL — ABNORMAL HIGH (ref 70–99)
Glucose-Capillary: 271 mg/dL — ABNORMAL HIGH (ref 70–99)
Glucose-Capillary: 235 mg/dL — ABNORMAL HIGH (ref 70–99)

## 2022-10-13 LAB — MAGNESIUM: Magnesium: 2.5 mg/dL — ABNORMAL HIGH (ref 1.7–2.4)

## 2022-10-13 LAB — CBC
HCT: 38.3 % — ABNORMAL LOW (ref 39.0–52.0)
Hemoglobin: 12.8 g/dL — ABNORMAL LOW (ref 13.0–17.0)
MCH: 28.1 pg (ref 26.0–34.0)
MCHC: 33.4 g/dL (ref 30.0–36.0)
MCV: 84.2 fL (ref 80.0–100.0)
Platelets: 230 10*3/uL (ref 150–400)
RBC: 4.55 MIL/uL (ref 4.22–5.81)
RDW: 14.8 % (ref 11.5–15.5)
WBC: 14.4 10*3/uL — ABNORMAL HIGH (ref 4.0–10.5)
nRBC: 0 % (ref 0.0–0.2)

## 2022-10-13 LAB — SEDIMENTATION RATE: Sed Rate: 45 mm/hr — ABNORMAL HIGH (ref 0–16)

## 2022-10-13 LAB — CSF CULTURE W GRAM STAIN

## 2022-10-13 LAB — C-REACTIVE PROTEIN: CRP: <0.5 mg/dL (ref <1.0)

## 2022-10-13 LAB — ANAEROBIC CULTURE W GRAM STAIN

## 2022-10-13 MED ORDER — POTASSIUM CHLORIDE 20 MEQ PO PACK
40.0000 meq | PACK | Freq: Three times a day (TID) | ORAL | Status: DC
  Administered 2022-10-13 – 2022-10-14 (×6): 40 meq via ORAL
  Filled 2022-10-13 (×6): qty 2

## 2022-10-13 MED ORDER — SODIUM ACETATE 2 MEQ/ML IV SOLN
INTRAVENOUS | Status: DC
  Filled 2022-10-13 (×4): qty 1000

## 2022-10-13 MED ORDER — POLYETHYLENE GLYCOL 3350 17 G PO PACK
17.0000 g | PACK | Freq: Every day | ORAL | Status: DC
  Administered 2022-10-13 – 2022-10-14 (×2): 17 g via ORAL
  Filled 2022-10-13 (×2): qty 1

## 2022-10-13 MED ORDER — SENNOSIDES-DOCUSATE SODIUM 8.6-50 MG PO TABS
1.0000 | ORAL_TABLET | Freq: Every day | ORAL | Status: DC
  Administered 2022-10-13 – 2022-10-26 (×13): 1 via ORAL
  Filled 2022-10-13 (×13): qty 1

## 2022-10-13 MED ORDER — SODIUM CHLORIDE 0.9 % IV SOLN
1000.0000 mg | INTRAVENOUS | Status: AC
  Administered 2022-10-13 – 2022-10-17 (×5): 1000 mg via INTRAVENOUS
  Filled 2022-10-13 (×5): qty 16

## 2022-10-13 MED ORDER — LORAZEPAM 2 MG/ML IJ SOLN
1.0000 mg | Freq: Once | INTRAMUSCULAR | Status: AC
  Administered 2022-10-13: 1 mg via INTRAVENOUS
  Filled 2022-10-13: qty 1

## 2022-10-13 MED ORDER — PANTOPRAZOLE SODIUM 40 MG IV SOLR
40.0000 mg | INTRAVENOUS | Status: AC
  Administered 2022-10-13 – 2022-10-17 (×5): 40 mg via INTRAVENOUS
  Filled 2022-10-13 (×5): qty 10

## 2022-10-13 MED ORDER — IOHEXOL 9 MG/ML PO SOLN
500.0000 mL | ORAL | Status: AC
  Administered 2022-10-13 (×2): 500 mL via ORAL

## 2022-10-13 MED ORDER — INSULIN GLARGINE-YFGN 100 UNIT/ML ~~LOC~~ SOLN
36.0000 [IU] | Freq: Every day | SUBCUTANEOUS | Status: DC
  Administered 2022-10-14: 36 [IU] via SUBCUTANEOUS
  Filled 2022-10-13: qty 0.36

## 2022-10-13 NOTE — Progress Notes (Signed)
LTM EEG discontinued - no skin breakdown at unhook.   

## 2022-10-13 NOTE — Procedures (Signed)
Patient Name: Jeremy Medina.  MRN: [id number]  Epilepsy Attending: Charlsie Quest  Referring Physician/Provider: Erick Blinks, MD  Duration: 10/12/2022 1553 to 10/13/2022 1553  Patient history: 72yo M with ams getting eeg to evaluate for seizure.   Level of alertness: Awake, asleep  AEDs during EEG study: Ativan  Technical aspects: This EEG study was done with scalp electrodes positioned according to the 10-20 International system of electrode placement. Electrical activity was reviewed with band pass filter of 1-70Hz , sensitivity of 7 uV/mm, display speed of 70mm/sec with a 60Hz  notched filter applied as appropriate. EEG data were recorded continuously and digitally stored.  Video monitoring was available and reviewed as appropriate.  Description: No clear posterior dominant rhythm was seen. Sleep was characterized by vertex waves, sleep spindles (12 to 14 Hz), maximal frontocentral region. EEG showed continuous generalized 3 to 6 Hz theta-delta slowing. Generalized periodic discharges with triphasic morphology at 1 Hz were noted, more prominent when awake/stimulated.   Hyperventilation and photic stimulation were not performed.     ABNORMALITY - Periodic discharges with triphasic morphology, generalized - Continuous slow, generalized  IMPRESSION: This study is suggestive of moderate diffuse encephalopathy, nonspecific etiology but likely related to toxic-metabolic causes. No seizures or definite epileptiform discharges were seen throughout the recording.  Daralyn Bert Annabelle Harman

## 2022-10-13 NOTE — Progress Notes (Addendum)
NEUROLOGY CONSULTATION PROGRESS NOTE   Date of service: October 13, 2022 Patient Name: Jeremy Medina. MRN:  [id number] DOB:  December 02, 1950  Brief HPI  72 year old male with progressively worsening cognition, gait changes with falls, and urinary incontinence x 3 months. Had normal functioning baseline prior to January. Admitted 09/28/22. PMH: chronic pain, depression, anxiety, HTN, DM2, HLD, folate deficiency, PTSD, hx of tobacco and alcohol abuse (quit drinking in his 30's; current smoker; 54 pack years), IDA, and lung nodules.  Interval Hx   Patient is seen in his room with no family at the bedside.  He will vocalize in response to some questions.  He does express some dissatisfaction with repeated questioning by providers.  He will intermittently follow commands and tracks the examiner around the room.  High-volume LP was performed 10/11/22.  Vitals   Vitals:   10/12/22 0924 10/12/22 2033 10/13/22 0737 10/13/22 0933  BP: (!) 141/96 (!) 142/91 (!) 155/88 (!) 145/87  Pulse: 88 79 84 87  Resp: 16 18 16    Temp:  98.5 F (36.9 C) 98.3 F (36.8 C)   TempSrc:  Oral Axillary   SpO2:  99% 99%   Weight:      Height:         Body mass index is 32.28 kg/m.  Physical Exam   General: Well-nourished, well-developed elderly patient lying in bed in no acute distress HENT: Normal oropharynx and mucosa. Normal external appearance of ears and nose.  Neck: Supple, no pain or tenderness  CV: No JVD. No peripheral edema.  Pulmonary: Symmetric Chest rise. Normal respiratory effort.  Abdomen: Soft to touch, non-tender.  Ext: No cyanosis, edema, or deformity  Skin: No rash. Normal palpation of skin.   Musculoskeletal: Normal digits and nails by inspection. No clubbing.   Neurologic Examination  Mental status/Cognition: Alert, makes eye contact. Intermittently follows 1 step commands. Speech/language: Makes sounds and speaks single words, sometimes repeating what was said to him.  He is able to  state his name correctly.  He does tell examiner that he does not like being questioned repeatedly.  He is unable to answer further orientation questions.  Does answer "no" when asked if he is experiencing any pain. Names thumb correctly, shows "thumbs up" and two fingers when asked.  He does not participate further in naming objects or repeating phrases.   Cranial nerves:   CN II Pupils equal and reactive to light   CN III,IV,VI EOM intact, no gaze preference or deviation, no nystagmus   CN V normal sensation in V1, V2, and V3 segments bilaterally   CN VII Face symmetrical at rest and when speaking   CN VIII Makes eye contact to speech   CN IX & X Protecting airway but would not open his mouth on command   CN XI Head midline.   CN XII Does not protrude tongue on command.   Motor:  Muscle bulk: normal, tone normal Moves all extremities spontaneously and purposefully. Shows "thumbs up" to command on the right hand, shows two fingers to command on the left hand.  Will wiggle toes to command, withdraws all extremities to pinch  Sensation:  Light touch    Pin prick Localizes to pinch in all extremities.   Temperature    Vibration   Proprioception    Coordination/Complex Motor:  Unable to assess.  Labs   Basic Metabolic Panel:  Lab Results  Component Value Date   NA 139 10/13/2022   K 3.4 (L) 10/13/2022  CO2 21 (L) 10/13/2022   GLUCOSE 153 (H) 10/13/2022   BUN 25 (H) 10/13/2022   CREATININE 1.47 (H) 10/13/2022   CALCIUM 9.7 10/13/2022   GFRNONAA 51 (L) 10/13/2022   GFRAA >60 10/11/2015   HbA1c:  Lab Results  Component Value Date   HGBA1C 9.7 (H) 09/13/2022   LDL: No results found for: "LDLCALC" Urine Drug Screen:     Component Value Date/Time   LABOPIA NONE DETECTED 09/14/2022 0357   COCAINSCRNUR NONE DETECTED 09/14/2022 0357   COCAINSCRNUR NEG 10/19/2015 1332   LABBENZ NONE DETECTED 09/14/2022 0357   AMPHETMU NONE DETECTED 09/14/2022 0357   THCU NONE DETECTED  09/14/2022 0357   LABBARB NONE DETECTED 09/14/2022 0357    Alcohol Level     Component Value Date/Time   ETH <10 09/28/2022 1535   No results found for: "PHENYTOIN", "ZONISAMIDE", "LAMOTRIGINE", "LEVETIRACETA" No results found for: "PHENYTOIN", "PHENOBARB", "VALPROATE", "CBMZ"  Imaging and Diagnostic studies   Pertinent Labs: -Cryptococcal antigen from 10/03/2022 negative -Cytology for from 10/03/2022 negative for malignancy with lymphocyte predominant inflammation present -CSF from 10/03/2022 with elevated WBCs at 60, RBCs 18, monocyte predominant, remainder lymphocytes, no neutrophils. -CSF culture with no growth x 3 days. -CSF from 4/25 with 29 WBCs, 1 RBC xanthochromic -Meningitis/encephalitis panel 4/25 negative -CSF culture from 4/25 pending -Protein elevated in CSF at 365, glucose normal at 67 -Fungus culture negative for 8 days -Meningitis panel negative -HIV negative -VDRL CSF nonreactive -QuantiFERON-TB gold negative -Blastomyces antigen pending -MT-RIF NAA w/ culture pending -Karius pending  Lumbar puncture on 10/03/2022 in interventional radiology -Opening pressure elevated at 36 cm H2O, closing pressure 10 cm H2O -15 mL of clear, straw-colored CSF obtained before flow halted  Lumbar puncture 4/25 in interventional radiology -Opening pressure of 34 cm H2O -17.5 mL of clear CSF obtained  EEG 10/02/22 Findings consistent with nonspecific encephalopathy  MRI Brain w/o C 09/14/22: 1. No acute intracranial abnormality. 2. Chronic white matter changes, most commonly due to chronic small vessel ischemia.  MRVenogram head: Negative for dural venous sinus thrombosis.  Impression   Gates Jividen. is a 72 year old male with subacute decline over 3-4 months with imaging demonstrating communicating hydrocephalus. Initial LP with elevated opening pressure, improved after LP and ?further after amphotericin B and was initially treated for presumptive fungal  meningitis. Repeat LP again with elevated opening pressure.  ID with lower suspicion of fungal meningitis based on initial lab results.  Unclear if he is selective about who he truly engages with or has plateaued on exam.  With ongoing concern for plateau of progress, will treat for a potential underlying autoimmune encephalopathy.  Will discuss initiation of steroid therapy with patient's family and ensure no contraindications with ID and primary team before initiating treatment.  If no response to steroids, will reach out to neurosurgery for possible shunt placement due to multiple elevated ICPs.   Recommendations  - Continue to avoid sedating and deliriogenic medications.  - ID team has low suspicion for fungal infection. - Initially felt to have NPH. Unlikely NPH given elevated opening pressure. - With plateau of neurologic progress, plan to start IV solumedrol 1,000 mg IV for 5 days. There are no contraindications to Solu-Medrol from ID standpoint. - CSF and serum autoimmune encephalopathy panel is pending. - If no improvement despite steroids, will reach out to neurosurgery to consider shunt placement for persistently elevated ICPs. - Recommend up in chair with safety measures as able. - PT/OT as exam continues to improve. ______________________________________________________________________  Patient seen and examined by NP/APP with MD. MD to update note as needed.   Kara Mead , MSN, AGACNP-BC [facility] See Amion for schedule and pager information 10/13/2022 11:34 AM  NEUROHOSPITALIST ADDENDUM Performed a face to face diagnostic evaluation.   I have reviewed the contents of history and physical exam as documented by PA/ARNP/Resident and agree with above documentation.  I have discussed and formulated the above plan as documented. Edits to the note have been made as needed.  Impression/Key exam findings/Plan: Exam still plateaued. No significant improvement over  the last 4 days. LTM EEG non revealing. Cleared by ID team for steroids.  Will start IV solumedrol 1000mg  daily x 5 days. If no improvement with steroids, will have neurosurg evalute him for shunt placement.  Erick Blinks, MD [facility] [phone]   If 7pm to 7am, please call on call as listed on AMION.

## 2022-10-13 NOTE — Progress Notes (Addendum)
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 reviewing separately obtained history (admission/discharge record), Performing a medically appropriate examination and/or evaluation , Ordering medications/tests/procedures, referring and communicating with other health care professionals, Documenting clinical information in the EMR, Independently interpreting results (not separately reported), Communicating results to the patient/family/caregiver, Counseling and educating the patient/family/caregiver and Care coordination (not separately reported).   Plan d/w requesting provider   Note: This document was prepared using dragon voice recognition software and may include unintentional dictation errors.   Electronically signed by:   Odette Fraction, MD Infectious Disease Physician [facility] Pager:  [phone]

## 2022-10-13 NOTE — Progress Notes (Signed)
Progress Note   Patient: Jeremy Medina. WGN:[id number] DOB: Oct 23, 1950 DOA: 09/28/2022     11 DOS: the patient was seen and examined on 10/13/2022 at 9:15AM   Brief hospital course: Jeremy Medina. is a 72 y.o. M with hx HTN, DM, chronic pain on methadone, PTSD/depression/anxiety on daily benzodiazepine who presented with concern for NPH.  Evidently recently admitted at [facility] for subacute encephalopathy (beginning ~Jan, 3 months PTA).  Work up there concerning for NPH, discharged to home with Neurology and Neurosurgery follow ups.  On day of admission, was seen in Neurosurgery clinic and sent to the ER for LP.  Incidentally found in the ER to have AKI Cr 2.3.    4/12: Admitted 4/14: Cr normalized, persistent encephalopathy noted 4/15: Neurology consulted 4/17: LP obtained 4/18: ID consulted, amphotericin started 4/19: Patient more alert 4/20-4/24--intermittent waxing waning mentation 4/25 immediately post LP more lucid-conversant short sentences---Fungal meningitis Etiology now questioned--LP shows 29 WBC on champagne tap---Broadened Abx coverage 4/26.  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    CT head and MRV showed no acute process.   No improvement with IV thiamine.   Neurology consulted, LP obtained that showed elevated protein  and WBC.  No substantial improvement with initial LP, Neurology suspected fungal meningitis.   LP performed again 4/25--34 cm water opening pressure patient evaluated subsequent to procedure and mentation improved he was verbalizing more  felt by neurology to be secondary to relief of CSF pressure - Amphotericin discontinued 4/27, steroids started as per neurology, long-term EEG non revealing -May be long-term EEG is appropriate to rule out underlying seizures? -ESR 45 CRP less than 0.5, ANA rheumatoid factor pending, encephalitis panel negative -Currently awaiting oligoclonal bands, fungal culture, VDRL, cytology, CSF culture/Gram stain no growth to date -May need shunt? -Getting CT abdomen chest pelvis  Vomiting Does not seem to be present now-episodic in nature Awaiting CT abdomen chest pelvis ordered as part of workup of above in addition to patient having mild abdominal pain Add laxative MiraLAX + senna at bedtime  Acute kidney injury Creatinine 2.3 on admission.  US renal unremarkable.   Started fluids D5 + acetate +40 of K@100  cc/H and monitor labs Hold lisinopril, start NS 50 cc/H and monitor chemistries carefully given on Amphotericin and vancomycin currently  Chronic pain syndrome - Continue home methadone 10 3 times daily-if no improvement would probably drop the dose to 5 3 times daily and check--[extreme long half-life which builds up in the system]--family resistant to this idea so we will keep methadone at 10 3 times daily Meperidine diiscontinued 4/24  Transaminitis Mild.  Hepatitis serologies negative.  Probably NASH given normal [place]. -Monitor trends  Diabetes mellitus type 2, controlled, without complications Glucose elevated  - Continue sliding scale corrections, CBGs 188-271 Lantus increased to 36 units [Amphotericin is being given in D5 which may be rising the sugars] - Hold home 70/30 - Hold home Crestor  Benign essential hypertension BP elevated  -  Continue  amlodipine 10mg  - Resume home Lisinopril at time of discharge versus discontinue completely given azotemia  Obesity (BMI 30-39.9) BMI 32  Folate deficiency B12 deficiency ruled out - Continue folate supplement  Anxiety - Continue home Ativan, dose minimized     Subjective:   Remains disoriented Long-term EEG finished and looks negative He is now receiving steroids Solu-Medrol for 5 doses 1 g Amphotericin was discontinued today We are now entertaining autoimmune disease as a potentiality   Physical Exam: BP 135/83 (BP Location: Left Arm)   Pulse 83   Temp 97.9 F (36.6 C) (Axillary)   Resp 16   Ht 5\' 10"  (1.778 m)   Wt 102.1 kg   SpO2 98%   BMI 32.28 kg/m    Awake and coherent and closes his eyes when I examined him- Cta b no added sound Abd soft no rebound no guarding seems relatively soft Further motor examination deferred as he is sleepy and does not seem to want to be disturbed   Family Communication: Discussed with son on 4/26 and await improvement Disposition: Status is: Inpatient The patient presented with subacute encephalopathy  ?  Fungal meningitis?  NPH?  Other--- follow recommendations of specialists-no clear discernible endpoint insight  Author: Rhetta Mura, MD 10/13/2022 6:24 PM  For on call review [email or web address].

## 2022-10-13 NOTE — Plan of Care (Signed)
Patient is resting in bed with vitals within normal limits. No pain expressed or observed. All meds have been given. Safety precautions are in place. Plan of care is ongoing.  Problem: Education: Goal: Ability to describe self-care measures that may prevent or decrease complications (Diabetes Survival Skills Education) will improve 10/13/2022 1140 by Clydie Braun, RN Outcome: Progressing 10/13/2022 1140 by Clydie Braun, RN Outcome: Progressing Goal: Individualized Educational Video(s) 10/13/2022 1140 by Clydie Braun, RN Outcome: Progressing 10/13/2022 1140 by Clydie Braun, RN Outcome: Progressing   Problem: Coping: Goal: Ability to adjust to condition or change in health will improve 10/13/2022 1140 by Clydie Braun, RN Outcome: Progressing 10/13/2022 1140 by Clydie Braun, RN Outcome: Progressing   Problem: Fluid Volume: Goal: Ability to maintain a balanced intake and output will improve 10/13/2022 1140 by Clydie Braun, RN Outcome: Progressing 10/13/2022 1140 by Clydie Braun, RN Outcome: Progressing   Problem: Health Behavior/Discharge Planning: Goal: Ability to identify and utilize available resources and services will improve 10/13/2022 1140 by Clydie Braun, RN Outcome: Progressing 10/13/2022 1140 by Clydie Braun, RN Outcome: Progressing Goal: Ability to manage health-related needs will improve 10/13/2022 1140 by Clydie Braun, RN Outcome: Progressing 10/13/2022 1140 by Clydie Braun, RN Outcome: Progressing   Problem: Metabolic: Goal: Ability to maintain appropriate glucose levels will improve 10/13/2022 1140 by Clydie Braun, RN Outcome: Progressing 10/13/2022 1140 by Clydie Braun, RN Outcome: Progressing   Problem: Nutritional: Goal: Maintenance of adequate nutrition will improve 10/13/2022 1140 by Clydie Braun, RN Outcome: Progressing 10/13/2022 1140 by Clydie Braun,  RN Outcome: Progressing Goal: Progress toward achieving an optimal weight will improve 10/13/2022 1140 by Clydie Braun, RN Outcome: Progressing 10/13/2022 1140 by Clydie Braun, RN Outcome: Progressing   Problem: Skin Integrity: Goal: Risk for impaired skin integrity will decrease 10/13/2022 1140 by Clydie Braun, RN Outcome: Progressing 10/13/2022 1140 by Clydie Braun, RN Outcome: Progressing   Problem: Tissue Perfusion: Goal: Adequacy of tissue perfusion will improve 10/13/2022 1140 by Clydie Braun, RN Outcome: Progressing 10/13/2022 1140 by Clydie Braun, RN Outcome: Progressing   Problem: Education: Goal: Knowledge of General Education information will improve Description: Including pain rating scale, medication(s)/side effects and non-pharmacologic comfort measures 10/13/2022 1140 by Clydie Braun, RN Outcome: Progressing 10/13/2022 1140 by Clydie Braun, RN Outcome: Progressing   Problem: Health Behavior/Discharge Planning: Goal: Ability to manage health-related needs will improve 10/13/2022 1140 by Clydie Braun, RN Outcome: Progressing 10/13/2022 1140 by Clydie Braun, RN Outcome: Progressing   Problem: Clinical Measurements: Goal: Ability to maintain clinical measurements within normal limits will improve 10/13/2022 1140 by Clydie Braun, RN Outcome: Progressing 10/13/2022 1140 by Clydie Braun, RN Outcome: Progressing Goal: Will remain free from infection 10/13/2022 1140 by Clydie Braun, RN Outcome: Progressing 10/13/2022 1140 by Clydie Braun, RN Outcome: Progressing Goal: Diagnostic test results will improve 10/13/2022 1140 by Clydie Braun, RN Outcome: Progressing 10/13/2022 1140 by Clydie Braun, RN Outcome: Progressing Goal: Respiratory complications will improve 10/13/2022 1140 by Clydie Braun, RN Outcome: Progressing 10/13/2022 1140 by Clydie Braun,  RN Outcome: Progressing Goal: Cardiovascular complication will be avoided 10/13/2022 1140 by Clydie Braun, RN Outcome: Progressing 10/13/2022 1140 by Clydie Braun, RN Outcome: Progressing   Problem: Activity: Goal: Risk for activity intolerance will decrease 10/13/2022 1140 by Clydie Braun, RN Outcome: Progressing 10/13/2022 1140 by Clydie Braun, RN  Outcome: Progressing   Problem: Nutrition: Goal: Adequate nutrition will be maintained 10/13/2022 1140 by Clydie Braun, RN Outcome: Progressing 10/13/2022 1140 by Clydie Braun, RN Outcome: Progressing   Problem: Coping: Goal: Level of anxiety will decrease 10/13/2022 1140 by Clydie Braun, RN Outcome: Progressing 10/13/2022 1140 by Clydie Braun, RN Outcome: Progressing   Problem: Elimination: Goal: Will not experience complications related to bowel motility 10/13/2022 1140 by Clydie Braun, RN Outcome: Progressing 10/13/2022 1140 by Clydie Braun, RN Outcome: Progressing Goal: Will not experience complications related to urinary retention 10/13/2022 1140 by Clydie Braun, RN Outcome: Progressing 10/13/2022 1140 by Clydie Braun, RN Outcome: Progressing   Problem: Pain Managment: Goal: General experience of comfort will improve 10/13/2022 1140 by Clydie Braun, RN Outcome: Progressing 10/13/2022 1140 by Clydie Braun, RN Outcome: Progressing   Problem: Safety: Goal: Ability to remain free from injury will improve 10/13/2022 1140 by Clydie Braun, RN Outcome: Progressing 10/13/2022 1140 by Clydie Braun, RN Outcome: Progressing   Problem: Skin Integrity: Goal: Risk for impaired skin integrity will decrease 10/13/2022 1140 by Clydie Braun, RN Outcome: Progressing 10/13/2022 1140 by Clydie Braun, RN Outcome: Progressing

## 2022-10-14 DIAGNOSIS — R569 Unspecified convulsions: Secondary | ICD-10-CM | POA: Diagnosis not present

## 2022-10-14 DIAGNOSIS — R4182 Altered mental status, unspecified: Secondary | ICD-10-CM | POA: Diagnosis not present

## 2022-10-14 DIAGNOSIS — G9340 Encephalopathy, unspecified: Secondary | ICD-10-CM | POA: Diagnosis not present

## 2022-10-14 DIAGNOSIS — G030 Nonpyogenic meningitis: Secondary | ICD-10-CM | POA: Diagnosis not present

## 2022-10-14 DIAGNOSIS — E785 Hyperlipidemia, unspecified: Secondary | ICD-10-CM | POA: Diagnosis not present

## 2022-10-14 DIAGNOSIS — E119 Type 2 diabetes mellitus without complications: Secondary | ICD-10-CM | POA: Diagnosis not present

## 2022-10-14 LAB — BASIC METABOLIC PANEL
Anion gap: 8 (ref 5–15)
BUN: 29 mg/dL — ABNORMAL HIGH (ref 8–23)
CO2: 22 mmol/L (ref 22–32)
Calcium: 9.4 mg/dL (ref 8.9–10.3)
Chloride: 106 mmol/L (ref 98–111)
Creatinine, Ser: 1.36 mg/dL — ABNORMAL HIGH (ref 0.61–1.24)
GFR, Estimated: 56 mL/min — ABNORMAL LOW (ref >60)
Glucose, Bld: 325 mg/dL — ABNORMAL HIGH (ref 70–99)
Potassium: 3.9 mmol/L (ref 3.5–5.1)
Sodium: 136 mmol/L (ref 135–145)

## 2022-10-14 LAB — RHEUMATOID FACTOR: Rheumatoid fact SerPl-aCnc: <10 IU/mL (ref <14.0)

## 2022-10-14 LAB — GLUCOSE, CAPILLARY
Glucose-Capillary: 367 mg/dL — ABNORMAL HIGH (ref 70–99)
Glucose-Capillary: 315 mg/dL — ABNORMAL HIGH (ref 70–99)
Glucose-Capillary: 210 mg/dL — ABNORMAL HIGH (ref 70–99)
Glucose-Capillary: 171 mg/dL — ABNORMAL HIGH (ref 70–99)

## 2022-10-14 LAB — MTB-RIF NAA W AFB CULT, NON-SPUTUM

## 2022-10-14 LAB — MAGNESIUM: Magnesium: 1.6 mg/dL — ABNORMAL LOW (ref 1.7–2.4)

## 2022-10-14 MED ORDER — LACTULOSE 10 GM/15ML PO SOLN
20.0000 g | Freq: Three times a day (TID) | ORAL | Status: DC
  Administered 2022-10-14: 20 g via ORAL
  Filled 2022-10-14: qty 30

## 2022-10-14 MED ORDER — POTASSIUM CHLORIDE 2 MEQ/ML IV SOLN
INTRAVENOUS | Status: DC
  Filled 2022-10-14 (×9): qty 1000

## 2022-10-14 MED ORDER — MAGNESIUM SULFATE 4 GM/100ML IV SOLN
4.0000 g | Freq: Once | INTRAVENOUS | Status: AC
  Administered 2022-10-14: 4 g via INTRAVENOUS
  Filled 2022-10-14: qty 100

## 2022-10-14 MED ORDER — INSULIN GLARGINE-YFGN 100 UNIT/ML ~~LOC~~ SOLN
42.0000 [IU] | Freq: Every day | SUBCUTANEOUS | Status: DC
  Administered 2022-10-15 – 2022-10-18 (×4): 42 [IU] via SUBCUTANEOUS
  Filled 2022-10-14 (×5): qty 0.42

## 2022-10-14 NOTE — Progress Notes (Addendum)
ID Brief Note    CT CAP reviewed with note mention of   Minimal patchy ground-glass and airspace opacities in the bilateral lower lobes, right greater than left, worrisome for infection.  Rectal wall thickening with presacral edema worrisome for proctitis.  Afebrile  No reported h/o respiratory complaints. He is on room air as well as no GI complaints like diarrhea Would monitor off abtx for now and reassess CBC not available today but expect it would falsely go up with high dose steroids Dr Earlene Plater back tomorrow D/w Primary team   Odette Fraction, MD Infectious Disease Physician [facility] for Infectious Disease [address] [place], [name] [address] Phone: [phone]  Fax: [phone]

## 2022-10-14 NOTE — Progress Notes (Signed)
NEUROLOGY CONSULTATION PROGRESS NOTE   Date of service: October 14, 2022 Patient Name: Jeremy Medina. MRN:  [id number] DOB:  1951-06-08  Brief HPI  72 year old male with progressively worsening cognition, gait changes with falls, and urinary incontinence x 3 months. Had normal functioning baseline prior to January. Admitted 09/28/22. PMH: chronic pain, depression, anxiety, HTN, DM2, HLD, folate deficiency, PTSD, hx of tobacco and alcohol abuse (quit drinking in his 30's; current smoker; 54 pack years), IDA, and lung nodules.  Interval Hx   Patient is seen in his room with no family at the bedside.  Patient received his first dose of IV Solu-Medrol overnight.  Patient does not vocalize on examination today.  He does not follow commands today.  He will track examiner.  Patient appears significantly uncomfortable during examination but examiner recognizes that patient has been incontinent of stool and appears uncomfortable.  Bedside RN notified.  Vitals   Vitals:   10/13/22 1643 10/13/22 1942 10/14/22 0506 10/14/22 0802  BP: 135/83 135/75 (!) 140/83 (!) 160/117  Pulse: 83 83 90 (!) 107  Resp: 16 18 18 18   Temp: 97.9 F (36.6 C) 98.4 F (36.9 C) 98.2 F (36.8 C) 99 F (37.2 C)  TempSrc: Axillary Oral Oral Oral  SpO2: 98% 98% 95% 97%  Weight:      Height:        Body mass index is 32.28 kg/m.  Physical Exam   General: Well-nourished, well-developed elderly patient lying in bed in no acute distress HENT: Normal oropharynx and mucosa. Normal external appearance of ears and nose.  Neck: Supple, no pain or tenderness  CV: No JVD. No peripheral edema.  Pulmonary: Symmetric Chest rise. Normal respiratory effort.  Abdomen: Soft to touch, non-tender.  Ext: No cyanosis, edema, or deformity  Skin: No rash. Normal palpation of skin.   Musculoskeletal: Normal digits and nails by inspection. No clubbing.   Neurologic Examination  Mental status/Cognition: Alert, makes eye contact.  Intermittently follows 1 step commands. Speech/language: Patient with significantly decreased verbalization today.  Patient does not attempt to say a single word.  He minimally vocalizes once in response to examiner saying "hey".  No further vocalization heard throughout exam.  He does not follow commands.  Patient does seem slightly restless during exam this morning.  Cranial nerves:   CN II Pupils equal and reactive to light   CN III,IV,VI EOM intact, no gaze preference or deviation, no nystagmus   CN V normal sensation in V1, V2, and V3 segments bilaterally   CN VII Face symmetrical at rest and when speaking   CN VIII Makes eye contact to speech   CN IX & X Protecting airway but would not open his mouth on command   CN XI Head midline.   CN XII Does not protrude tongue on command.   Motor:  Muscle bulk: normal, tone normal Moves all extremities spontaneously and purposefully.  Reacts to touch throughout.  Sensation:  Light touch    Pin prick Localizes to pinch in all extremities.   Temperature    Vibration   Proprioception    Coordination/Complex Motor:  Unable to assess.  Labs   Basic Metabolic Panel:  Lab Results  Component Value Date   NA 136 10/14/2022   K 3.9 10/14/2022   CO2 22 10/14/2022   GLUCOSE 325 (H) 10/14/2022   BUN 29 (H) 10/14/2022   CREATININE 1.36 (H) 10/14/2022   CALCIUM 9.4 10/14/2022   GFRNONAA 56 (L) 10/14/2022   GFRAA >  60 10/11/2015   HbA1c:  Lab Results  Component Value Date   HGBA1C 9.7 (H) 09/13/2022   LDL: No results found for: "LDLCALC" Urine Drug Screen:     Component Value Date/Time   LABOPIA NONE DETECTED 09/14/2022 0357   COCAINSCRNUR NONE DETECTED 09/14/2022 0357   COCAINSCRNUR NEG 10/19/2015 1332   LABBENZ NONE DETECTED 09/14/2022 0357   AMPHETMU NONE DETECTED 09/14/2022 0357   THCU NONE DETECTED 09/14/2022 0357   LABBARB NONE DETECTED 09/14/2022 0357    Alcohol Level     Component Value Date/Time   ETH <10 09/28/2022  1535   No results found for: "PHENYTOIN", "ZONISAMIDE", "LAMOTRIGINE", "LEVETIRACETA" No results found for: "PHENYTOIN", "PHENOBARB", "VALPROATE", "CBMZ"  Imaging and Diagnostic studies   Pertinent Labs: -Cryptococcal antigen from 10/03/2022 negative -Cytology for from 10/03/2022 negative for malignancy with lymphocyte predominant inflammation present -CSF from 10/03/2022 with elevated WBCs at 60, RBCs 18, monocyte predominant, remainder lymphocytes, no neutrophils. -CSF culture with no growth x 3 days. -CSF from 4/25 with 29 WBCs, 1 RBC xanthochromic -Meningitis/encephalitis panel 4/25 negative -CSF culture from 4/25 pending -Protein elevated in CSF at 365, glucose normal at 67 -Fungus culture negative for 8 days -Meningitis panel negative -HIV negative -VDRL CSF nonreactive -QuantiFERON-TB gold negative -Blastomyces antigen pending -MT-RIF NAA w/ culture pending -Karius pending  Lumbar puncture on 10/03/2022 in interventional radiology -Opening pressure elevated at 36 cm H2O, closing pressure 10 cm H2O -15 mL of clear, straw-colored CSF obtained before flow halted  Lumbar puncture 4/25 in interventional radiology -Opening pressure of 34 cm H2O -17.5 mL of clear CSF obtained  EEG 10/02/22 Findings consistent with nonspecific encephalopathy  EEG 10/13/22: This study is suggestive of moderate diffuse encephalopathy, nonspecific etiology but likely related to toxic-metabolic causes. No seizures or definite epileptiform discharges were seen throughout the recording.   MRI Brain w/o C 09/14/22: 1. No acute intracranial abnormality. 2. Chronic white matter changes, most commonly due to chronic small vessel ischemia.  MRVenogram head: Negative for dural venous sinus thrombosis.  Impression   Jeremy Salsgiver. is a 72 year old male with subacute decline over 3-4 months with imaging demonstrating communicating hydrocephalus. Initial LP with elevated opening pressure, improved  after LP and ?further after amphotericin B and was initially treated for presumptive fungal meningitis. Repeat LP again with elevated opening pressure.  ID with lower suspicion of fungal meningitis based on initial lab results.  Unclear if he is selective about who he truly engages with or has plateaued on exam.  With ongoing concern for plateau of progress, will treat for a potential underlying autoimmune encephalopathy.  Will discuss initiation of steroid therapy with patient's family and ensure no contraindications with ID and primary team before initiating treatment.  If no response to steroids, will reach out to neurosurgery for possible shunt placement due to multiple elevated ICPs.   Recommendations  - Continue to avoid sedating and deliriogenic medications.  - ID team has low suspicion for fungal infection. - Initially felt to have NPH. Unlikely NPH given elevated opening pressure. - With plateau of neurologic progress, IV solumedrol 1,000 mg IV for 5 days started 10/13/22. There are no contraindications to Solu-Medrol from ID standpoint.  - CSF and serum autoimmune encephalopathy panel is pending. - If no improvement despite steroids, will reach out to neurosurgery to consider shunt placement for persistently elevated ICPs. - Recommend up in chair with safety measures as able. - PT/OT as exam continues to improve. ______________________________________________________________________  Patient seen and examined by  NP/APP with MD. MD to update note as needed.   Kara Mead , MSN, AGACNP-BC [facility] See Amion for schedule and pager information 10/14/2022 9:35 AM  NEUROHOSPITALIST ADDENDUM Performed a face to face diagnostic evaluation.   I have reviewed the contents of history and physical exam as documented by PA/ARNP/Resident and agree with above documentation.  I have discussed and formulated the above plan as documented. Edits to the note have been made as  needed.  Impression/Key exam findings/Plan: no significant change in exam after first dose of IV solumedrol.  Erick Blinks, MD [facility] [phone]   If 7pm to 7am, please call on call as listed on AMION.

## 2022-10-14 NOTE — Procedures (Signed)
Patient Name: Jeremy Medina.  MRN: [id number]  Epilepsy Attending: Charlsie Quest  Referring Physician/Provider: Erick Blinks, MD  Duration: 10/13/2022 1553 to 10/13/2022 1828   Patient history: 72yo M with ams getting eeg to evaluate for seizure.    Level of alertness: Awake   AEDs during EEG study: None   Technical aspects: This EEG study was done with scalp electrodes positioned according to the 10-20 International system of electrode placement. Electrical activity was reviewed with band pass filter of 1-70Hz , sensitivity of 7 uV/mm, display speed of 35mm/sec with a 60Hz  notched filter applied as appropriate. EEG data were recorded continuously and digitally stored.  Video monitoring was available and reviewed as appropriate.   Description: No clear posterior dominant rhythm was seen. EEG showed continuous generalized 3 to 6 Hz theta-delta slowing. Generalized periodic discharges with triphasic morphology at 1 Hz were noted, more prominent when awake/stimulated.   Hyperventilation and photic stimulation were not performed.      ABNORMALITY - Periodic discharges with triphasic morphology, generalized - Continuous slow, generalized   IMPRESSION: This study is suggestive of moderate diffuse encephalopathy, nonspecific etiology but likely related to toxic-metabolic causes. No seizures or definite epileptiform discharges were seen throughout the recording.   Earlyn Sylvan Annabelle Harman

## 2022-10-14 NOTE — Progress Notes (Addendum)
Progress Note   Patient: Jeremy Medina. ZOX:[id number] DOB: 03/27/1951 DOA: 09/28/2022     12 DOS: the patient was seen and examined on 10/14/2022 at 9:15AM   Brief hospital course: Jeremy Medina. is a 72 y.o. M with hx HTN, DM, chronic pain on methadone, PTSD/depression/anxiety on daily benzodiazepine who presented with concern for NPH.  Evidently recently admitted at [facility] for subacute encephalopathy (beginning ~Jan, 3 months PTA).  Work up there concerning for NPH, discharged to home with Neurology and Neurosurgery follow ups.  On day of admission, was seen in Neurosurgery clinic and sent to the ER for LP.  Incidentally found in the ER to have AKI Cr 2.3.    4/12: Admitted 4/14: Cr normalized, persistent encephalopathy noted 4/15: Neurology consulted 4/17: LP obtained 4/18: ID consulted, amphotericin started 4/19: Patient more alert 4/20-4/24--intermittent waxing waning mentation 4/25 immediately post LP more lucid-conversant short sentences---Fungal meningitis Etiology now questioned--LP shows 29 WBC on champagne tap---Broadened Abx coverage 4/26.  Broad coverage was discontinued by ID-still awaiting labs-long-term EEG planned for tonight  4/27 EEG grossly negative, amphotericin B stopped, IV fluids reinitiated 2/2 AKI, IV steroids Solu-Medrol started, working up autoimmune encephalitis and getting ANA and other autoimmune panel 4/28 CT chest abdomen pelvis negative for any gross pathology other than potentially some mild proctitis which may be an overcall   Assessment and Plan: Suspected fungal meningitis Progressive subacute encephalopathy Progressive since January-7 urgent care visits -initially--felt to have normal pressure hydrocephalus at the [place] a admitted here from neurosurgery office with concerns for alternate causes and none could be found- -- initially nonverbal, during first several days in hospital.    CT head and MRV showed no acute process.   No improvement  with IV thiamine.   Neurology consulted, initial LP obtained that showed elevated protein and WBC.   No substantial improvement with initial LP, Neurology suspected fungal meningitis.   LP performed again 4/25--34 cm water opening pressure patient evaluated subsequent to procedure and mentation improved he was verbalizing more felt by neurology to be secondary to relief of CSF pressure -Amphotericin discontinued 4/27, steroids started as per neurology, l -Long-term EEG non revealing -ESR 45 CRP less than 0.5,  -From 4/25 LP: Encephalitis panel negative, CSF Gram stain NGTD fungal CSF negative Pending oligoclonal bands, VDRL CSF, multiple send out labs, non Pap cytology, anaerobic culture pending surgical pathology pending - CT chest abdomen pelvis overall not very impressive  Vomiting Does not seem to be present now-episodic in nature CT abdomen pelvis?  Proctitis but overall exam is benign laxative MiraLAX + senna at bedtime  Acute kidney injury Creatinine 2.3 on admission.  US renal unremarkable.   Fluids changed to LR 75 cc/H +30 of K  Hold lisinopril,   Chronic pain syndrome - Continue home methadone 10 3 times daily-if no improvement would probably drop the dose to 5 3 times daily and check--[extreme long half-life which builds up in the system]--family resistant to this idea so we will keep methadone at 10 3 times daily Meperidine diiscontinued 4/24  Transaminitis Mild.  Hepatitis serologies negative.  Probably NASH given normal [place]. -Monitor trends of LFTs- Check ammonia in a.m.--empiric lactulose 3 doses  Diabetes mellitus type 2, controlled, without complications Glucose elevated  - Continue sliding scale corrections, CBGs 210-367 today and worsening glycemic control secondary to high-dose steroids Lantus increased to 42 units  - Hold home 70/30 - Hold home Crestor  Benign essential hypertension BP elevated  - Continue amlodipine  10mg  - Resume home Lisinopril at time of  discharge versus discontinue completely given azotemia  Obesity (BMI 30-39.9) BMI 32  Folate deficiency B12 deficiency ruled out - Continue folate supplement  Anxiety - Ativan DC 4/28, DC Benadryl which was previously given for amphotericin B coadministration     Subjective:   Remains disoriented No real discernible change although seems to track a little better   Physical Exam: BP (!) 156/91   Pulse (!) 112   Temp 99 F (37.2 C) (Oral)   Resp 18   Ht 5\' 10"  (1.778 m)   Wt 102.1 kg   SpO2 97%   BMI 32.28 kg/m    Tracking a little better but nonverbal seems "spaced out" although seems to be following me intently when I see him mumbles occasionally Cta b no added sound Abd soft no rebound no guarding seems relatively soft-slight upper quadrant tenderness right side Reflexes 2/3 in knees    Family Communication: Discussed with son on 4/26 and await improvement Disposition: Status is: Inpatient The patient presented with subacute encephalopathy  ?  Fungal meningitis?  NPH?  Other--- follow recommendations of specialists-no clear discernible endpoint insight  Author: Rhetta Mura, MD 10/14/2022 5:30 PM  For on call review [email or web address].

## 2022-10-15 DIAGNOSIS — G030 Nonpyogenic meningitis: Secondary | ICD-10-CM | POA: Diagnosis not present

## 2022-10-15 LAB — CBC
HCT: 35.9 % — ABNORMAL LOW (ref 39.0–52.0)
Hemoglobin: 12.8 g/dL — ABNORMAL LOW (ref 13.0–17.0)
MCH: 28.4 pg (ref 26.0–34.0)
MCHC: 35.7 g/dL (ref 30.0–36.0)
MCV: 79.8 fL — ABNORMAL LOW (ref 80.0–100.0)
Platelets: 257 10*3/uL (ref 150–400)
RBC: 4.5 MIL/uL (ref 4.22–5.81)
RDW: 15.2 % (ref 11.5–15.5)
WBC: 25 10*3/uL — ABNORMAL HIGH (ref 4.0–10.5)
nRBC: 0 % (ref 0.0–0.2)

## 2022-10-15 LAB — COMPREHENSIVE METABOLIC PANEL
ALT: 61 U/L — ABNORMAL HIGH (ref 0–44)
AST: 40 U/L (ref 15–41)
Albumin: 3.2 g/dL — ABNORMAL LOW (ref 3.5–5.0)
Alkaline Phosphatase: 74 U/L (ref 38–126)
Anion gap: 11 (ref 5–15)
BUN: 53 mg/dL — ABNORMAL HIGH (ref 8–23)
CO2: 19 mmol/L — ABNORMAL LOW (ref 22–32)
Calcium: 9.6 mg/dL (ref 8.9–10.3)
Chloride: 107 mmol/L (ref 98–111)
Creatinine, Ser: 1.89 mg/dL — ABNORMAL HIGH (ref 0.61–1.24)
GFR, Estimated: 38 mL/min — ABNORMAL LOW (ref >60)
Glucose, Bld: 281 mg/dL — ABNORMAL HIGH (ref 70–99)
Potassium: 3.9 mmol/L (ref 3.5–5.1)
Sodium: 137 mmol/L (ref 135–145)
Total Bilirubin: 0.8 mg/dL (ref 0.3–1.2)
Total Protein: 6.5 g/dL (ref 6.5–8.1)

## 2022-10-15 LAB — GLUCOSE, CAPILLARY
Glucose-Capillary: 177 mg/dL — ABNORMAL HIGH (ref 70–99)
Glucose-Capillary: 214 mg/dL — ABNORMAL HIGH (ref 70–99)
Glucose-Capillary: 311 mg/dL — ABNORMAL HIGH (ref 70–99)
Glucose-Capillary: 407 mg/dL — ABNORMAL HIGH (ref 70–99)
Glucose-Capillary: 413 mg/dL — ABNORMAL HIGH (ref 70–99)

## 2022-10-15 LAB — CYTOLOGY - NON PAP

## 2022-10-15 LAB — CULTURE, FUNGUS WITHOUT SMEAR

## 2022-10-15 LAB — MAGNESIUM: Magnesium: 2.5 mg/dL — ABNORMAL HIGH (ref 1.7–2.4)

## 2022-10-15 LAB — AMMONIA: Ammonia: 25 umol/L (ref 9–35)

## 2022-10-15 LAB — VDRL, CSF: VDRL Quant, CSF: NONREACTIVE

## 2022-10-15 LAB — ANA W/REFLEX IF POSITIVE: Anti Nuclear Antibody (ANA): NEGATIVE

## 2022-10-15 MED ORDER — POTASSIUM CHLORIDE 20 MEQ PO PACK: 40.0000 meq | PACK | Freq: Once | ORAL | Status: DC

## 2022-10-15 MED ORDER — POTASSIUM CHLORIDE 20 MEQ PO PACK: 40.0000 meq | PACK | Freq: Two times a day (BID) | ORAL | Status: DC

## 2022-10-15 MED ORDER — METHADONE HCL 10 MG PO TABS
5.0000 mg | ORAL_TABLET | Freq: Three times a day (TID) | ORAL | Status: DC
  Administered 2022-10-16 – 2022-10-18 (×7): 5 mg via ORAL
  Filled 2022-10-15 (×7): qty 1

## 2022-10-15 MED ORDER — INSULIN ASPART 100 UNIT/ML IJ SOLN
0.0000 [IU] | INTRAMUSCULAR | Status: DC
  Administered 2022-10-16 (×2): 3 [IU] via SUBCUTANEOUS
  Administered 2022-10-16: 15 [IU] via SUBCUTANEOUS
  Administered 2022-10-16 (×3): 3 [IU] via SUBCUTANEOUS
  Administered 2022-10-17: 2 [IU] via SUBCUTANEOUS
  Administered 2022-10-17: 8 [IU] via SUBCUTANEOUS
  Administered 2022-10-17: 2 [IU] via SUBCUTANEOUS
  Administered 2022-10-17: 3 [IU] via SUBCUTANEOUS
  Administered 2022-10-18: 2 [IU] via SUBCUTANEOUS
  Administered 2022-10-18 (×3): 3 [IU] via SUBCUTANEOUS
  Administered 2022-10-18: 15 [IU] via SUBCUTANEOUS
  Administered 2022-10-18: 5 [IU] via SUBCUTANEOUS

## 2022-10-15 MED ORDER — INSULIN ASPART 100 UNIT/ML IJ SOLN
15.0000 [IU] | Freq: Once | INTRAMUSCULAR | Status: AC
  Administered 2022-10-15: 15 [IU] via SUBCUTANEOUS

## 2022-10-15 MED ORDER — INSULIN ASPART 100 UNIT/ML IJ SOLN
20.0000 [IU] | Freq: Once | INTRAMUSCULAR | Status: AC
  Administered 2022-10-15: 20 [IU] via SUBCUTANEOUS

## 2022-10-15 NOTE — Progress Notes (Signed)
This RN rechecked patient's BG at 1930. MD Thornton Dales messaged on secure chat. Night shift RN Ilean Skill included in chat with MD. Awaiting orders.

## 2022-10-15 NOTE — Progress Notes (Signed)
Progress Note   Patient: Jeremy Medina. ZOX:[id number] DOB: [birth year] DOA: 09/28/2022     13 DOS: the patient was seen and examined on 10/15/2022 at 9:15AM   Brief hospital course: [name]. is a 72 y.o. M with hx HTN, DM, chronic pain on methadone, PTSD/depression/anxiety on daily benzodiazepine who presented with concern for NPH.  Evidently recently admitted at [facility] for subacute encephalopathy (beginning ~Jan, 3 months PTA).  Work up there concerning for NPH, discharged to home with Neurology and Neurosurgery follow ups.  On day of admission, was seen in Neurosurgery clinic and sent to the ER for LP.  Incidentally found in the ER to have AKI Cr 2.3.    4/12: Admitted 4/14: Cr normalized, persistent encephalopathy noted 4/15: Neurology consulted 4/17: LP obtained 4/18: ID consulted, amphotericin started 4/19: Patient more alert 4/20-4/24--intermittent waxing waning mentation 4/25 immediately post LP more lucid-conversant short sentences---Fungal meningitis Etiology now questioned--LP shows 29 WBC on champagne tap---Broadened Abx coverage 4/26.  Broad coverage was discontinued by ID-still awaiting labs-long-term EEG planned for tonight  4/27 EEG grossly negative, amphotericin B stopped, IV fluids reinitiated 2/2 AKI, IV steroids Solu-Medrol started, working up autoimmune encephalitis and getting ANA and other autoimmune panel 4/28 CT chest abdomen pelvis negative for any gross pathology other than potentially some mild proctitis which may be an overcall   Assessment and Plan: Suspected fungal meningitis Progressive subacute encephalopathy Progressive since January-7 urgent care visits -initially--felt to have normal pressure hydrocephalus at the [place] a admitted here from neurosurgery office with concerns for alternate causes and none could be found- -- initially nonverbal, during first several days in hospital.    CT head and MRV showed no acute process.   No improvement  with IV thiamine.   Neurology consulted, initial LP obtained that showed elevated protein and WBC.   No substantial improvement with initial LP, Neurology suspected fungal meningitis.   LP performed again 4/25--34 cm water opening pressure patient evaluated subsequent to procedure and mentation improved he was verbalizing more felt by neurology to be secondary to relief of CSF pressure -Amphotericin discontinued 4/27, steroids started as per neurology, l -Long-term EEG non revealing -ESR 45 CRP less than 0.5 rheumatoid factor, ANA are negative -From 4/25 LP: Encephalitis panel negative, CSF Gram stain NGTD fungal CSF negative Pending oligoclonal bands, VDRL CSF, multiple send out labs, anaerobic culture pending  Rare atypical cells present in spinal fluid-unclear significance will ask neurology - CT chest abdomen pelvis overall not very impressive -Etiology appears to continue to be cryptogenic  Vomiting Does not seem to be present now-episodic in nature CT abdomen pelvis?  Proctitis but overall exam is benign laxative MiraLAX + senna at bedtime  Acute kidney injury Creatinine creeping up again now currently 53/1.8, bladder scan 71 IVF rate increased to 125 cc/H +30 of K monitor trends in a.m. Hold lisinopril  Chronic pain syndrome - Continue home methadone 10 3 times daily-if no improvement would probably drop the dose to 5 3 times daily and check--[extreme long half-life which builds up in the system]- -family resistant to this idea so we will keep methadone at 10 3 times daily Meperidine diiscontinued 4/24  Transaminitis Mild.  Hepatitis serologies negative.  Probably NASH given normal [place]. -Monitor trends of LFTs- ammonia  Diabetes mellitus type 2, controlled, without complications Glucose elevated  - Continue sliding scale corrections, CBGs 214-311 today and worsening glycemic control secondary to high-dose steroids Lantus increased to 42 units  - Hold home 70/30 -  Hold home  Crestor  Benign essential hypertension BP elevated  - Continue amlodipine 10mg  - Resume home Lisinopril at time of discharge versus discontinue completely given azotemia  Obesity (BMI 30-39.9) BMI 32  Folate deficiency B12 deficiency ruled out - Continue folate supplement  Anxiety - Ativan DC 4/28, DC Benadryl which was previously given for amphotericin B coadministration   Subjective:   Called spoke with son [name] on phone--this am patient to [name] was about the same--more alert but hasn't really changed, not speaking much per son Was able to eat some this am  To me patient is non-responsive and doesn't verbalize--looks uncomfortable at times and pushed out his hands   Physical Exam: BP (!) 143/82 (BP Location: Left Arm)   Pulse (!) 101   Temp 97.9 F (36.6 C)   Resp 18   Ht 5\' 10"  (1.778 m)   Wt 102.1 kg   SpO2 91%   BMI 32.28 kg/m    Still densely encephalopathic Cta b no added sound Abd soft no rebound no guarding seems relatively soft-slight upper quadrant tenderness right side Reflexes 2/3 in knees No other issues    Family Communication: Discussed with son on 4/29  Disposition: Status is: Inpatient The patient presented with subacute encephalopathy  ?  Fungal meningitis?  NPH?  Other--- follow recommendations of specialists-no clear discernible endpoint insight  Author: Rhetta Mura, MD 10/15/2022 4:51 PM  For on call review [email or web address].

## 2022-10-15 NOTE — Progress Notes (Signed)
RN was notified that patient's BG was 413. RN notified Rhetta Mura MD. Orders placed for 20 u of Novolog. Novolog given and BG is to be rechecked at 1930 pm. RN notified night RN.

## 2022-10-15 NOTE — Progress Notes (Signed)
[facility]  Date of Admission:  09/28/2022           Reason for visit: Follow up on encephalopathy  Current antibiotics: None  ASSESSMENT:    72 y.o. male admitted with:  Progressive subacute encephalopathy and elevated intracranial pressure: Patient has undergone lumbar puncture x 2 with elevated OP of greater than 30 cmH2O.  CSF noted to have elevated WBCs of unclear etiology.  Multiple infectious workup has been unremarkable as detailed below.  He also received approximately 8 days of Amphotericin with no significant improvement.  Today is day #3 of high-dose steroids for treatment of possible autoimmune encephalitis after his ID workup was unrevealing.   Lumbar puncture 10/04/2022: Opening pressure 36, closing pressure 10.  Only able to get 15 mL CSF cytology--no malignant cells CSF cultures--negative Crypto antigen--negative Meningitis encephalitis panel--negative Blastomyces serum--negative CSF VDRL--negative MTB/RIF--pending Coccidioides--pending Histo urine Ag--negative   Lumbar puncture 10/11/22: Opening pressure 34, closing pressure 9.  Removal of 17.5 mL of clear CSF CSF cytology--pending CSF pathology--pending Meningitis encephalitis panel--negative CSF VDRL--pending CSF paraneoplastic panel--pending CSF autoimmune encephalitis panel--pending CSF & Serum oligoclonal bands--pending CSF cultures--pending   Serologic workup: QuantiFERON--negative Fungitell--negative Karius test--Negative Paraneoplastic panel--pending Autoimmune encephalitis panel--pending  RECOMMENDATIONS:    No new recommendations Continue steroids Following peripherally for pending ID send out labs   Principal Problem:   Suspected fungal meningitis Active Problems:   Chronic pain syndrome   Depression   Anxiety   Benign essential hypertension   Diabetes mellitus type 2, controlled, without complications (HCC)   Hyperlipidemia   Acute kidney  injury (HCC)   Progressive subacute encephalopathy   Folate deficiency   Transaminitis   Obesity (BMI 30-39.9)   Hypokalemia   Dysphagia   Vomiting    MEDICATIONS:    Scheduled Meds:  amLODipine  10 mg Oral Daily   folic acid  1 mg Oral Daily   insulin aspart  0-15 Units Subcutaneous TID WC   insulin aspart  0-5 Units Subcutaneous QHS   insulin glargine-yfgn  42 Units Subcutaneous Daily   lidocaine (PF)  5 mL Other Once   lipase/protease/amylase  36,000 Units Oral TID AC   methadone  10 mg Oral TID   pantoprazole (PROTONIX) IV  40 mg Intravenous Q24H   senna-docusate  1 tablet Oral QHS   sodium chloride flush  3 mL Intravenous Q12H   Continuous Infusions:  lactated ringers 1,000 mL with potassium chloride 30 mEq infusion 125 mL/hr at 10/15/22 0843   methylPREDNISolone (SOLU-MEDROL) injection Stopped (10/14/22 1803)   PRN Meds:.acetaminophen, acetaminophen, ondansetron **OR** ondansetron (ZOFRAN) IV  SUBJECTIVE:   24 hour events:  No acute events noted overnight Amphotericin B discontinued 4/27 Steroids started 4/27 as well Karius test was negative   No significant change in exam since starting steroids afebrile Tmax 97.9 WBC 25 acutely risen after steroids No new imaging since CT chest, abdomen, pelvis on 4/27  Review of Systems  Unable to perform ROS: Mental status change      OBJECTIVE:   Blood pressure (!) 143/82, pulse (!) 101, temperature 97.9 F (36.6 C), resp. rate 18, height 5\' 10"  (1.778 m), weight 102.1 kg, SpO2 91 %. Body mass index is 32.28 kg/m.  Physical Exam Constitutional:      Comments: Patient is lying in bed and sleeping upon entering the room.  The television is on.  He quickly awakens to voice.  He follows the examiner through the room.  He  withdraws to stimuli.  He does not verbalize any responses.  HENT:     Head: Normocephalic and atraumatic.  Eyes:     Extraocular Movements: Extraocular movements intact.     Conjunctiva/sclera:  Conjunctivae normal.  Pulmonary:     Effort: Pulmonary effort is normal. No respiratory distress.  Abdominal:     General: There is no distension.     Palpations: Abdomen is soft.  Musculoskeletal:        General: Normal range of motion.     Cervical back: Normal range of motion and neck supple.  Skin:    General: Skin is warm and dry.     Findings: No rash.      Lab Results: Lab Results  Component Value Date   WBC 25.0 (H) 10/15/2022   HGB 12.8 (L) 10/15/2022   HCT 35.9 (L) 10/15/2022   MCV 79.8 (L) 10/15/2022   PLT 257 10/15/2022    Lab Results  Component Value Date   NA 137 10/15/2022   K 3.9 10/15/2022   CO2 19 (L) 10/15/2022   GLUCOSE 281 (H) 10/15/2022   BUN 53 (H) 10/15/2022   CREATININE 1.89 (H) 10/15/2022   CALCIUM 9.6 10/15/2022   GFRNONAA 38 (L) 10/15/2022   GFRAA >60 10/11/2015    Lab Results  Component Value Date   ALT 61 (H) 10/15/2022   AST 40 10/15/2022   ALKPHOS 74 10/15/2022   BILITOT 0.8 10/15/2022       Component Value Date/Time   CRP <0.5 10/13/2022 0751       Component Value Date/Time   ESRSEDRATE 45 (H) 10/13/2022 0751     I have reviewed the micro and lab results in Epic.  Imaging: CT CHEST ABDOMEN PELVIS WO CONTRAST  Result Date: 10/13/2022 CLINICAL DATA:  Leukocytosis. EXAM: CT CHEST, ABDOMEN AND PELVIS WITHOUT CONTRAST TECHNIQUE: Multidetector CT imaging of the chest, abdomen and pelvis was performed following the standard protocol without IV contrast. RADIATION DOSE REDUCTION: This exam was performed according to the departmental dose-optimization program which includes automated exposure control, adjustment of the mA and/or kV according to patient size and/or use of iterative reconstruction technique. COMPARISON:  CT abdomen and pelvis 08/02/2022. Report only CT of the chest 08/26/2015. FINDINGS: CT CHEST FINDINGS Cardiovascular: No significant vascular findings. Normal heart size. No pericardial effusion. Mediastinum/Nodes: No  enlarged mediastinal, hilar, or axillary lymph nodes. Thyroid gland, trachea, and esophagus demonstrate no significant findings. Lungs/Pleura: There are minimal patchy ground-glass and airspace opacities in the bilateral lower lobes, right greater than left. There is no pleural effusion or pneumothorax. Musculoskeletal: There are healed left fourth through eleventh rib fractures. No acute fractures are seen. CT ABDOMEN PELVIS FINDINGS Hepatobiliary: No focal liver abnormality is seen. No gallstones, gallbladder wall thickening, or biliary dilatation. Pancreas: Unremarkable. No pancreatic ductal dilatation or surrounding inflammatory changes. Spleen: Normal in size without focal abnormality. Adrenals/Urinary Tract: There is an 8 mm calculus in the right kidney, unchanged. Otherwise, the kidneys, adrenal glands and bladder are within normal limits. Stomach/Bowel: There is mild rectal wall thickening with some presacral edema. There is no bowel obstruction, pneumatosis or free air. The appendix, small bowel and stomach are within normal limits. Vascular/Lymphatic: Aortic atherosclerosis. No enlarged abdominal or pelvic lymph nodes. Reproductive: Prostate is unremarkable. Other: No abdominal wall hernia or abnormality. No abdominopelvic ascites. Musculoskeletal: No acute or significant osseous findings. IMPRESSION: 1. Minimal patchy ground-glass and airspace opacities in the bilateral lower lobes, right greater than left, worrisome for infection. 2.  Rectal wall thickening with presacral edema worrisome for proctitis. 3. Nonobstructing right renal calculus. Aortic Atherosclerosis (ICD10-I70.0). Electronically Signed   By: Darliss Cheney M.D.   On: 10/13/2022 21:36     Imaging independently reviewed in Epic.    Vedia Coffer for Infectious [facility] [phone] pager 10/15/2022, 10:38 AM  I have personally spent 50 minutes involved in face-to-face and non-face-to-face  activities for this patient on the day of the visit. Professional time spent includes the following activities: Preparing to see the patient (review of tests), Obtaining and/or reviewing separately obtained history (admission/discharge record), Performing a medically appropriate examination and/or evaluation , Ordering medications/tests/procedures, referring and communicating with other health care professionals, Documenting clinical information in the EMR, Independently interpreting results (not separately reported), Communicating results to the patient/family/caregiver, Counseling and educating the patient/family/caregiver and Care coordination (not separately reported).

## 2022-10-15 NOTE — Progress Notes (Addendum)
Physical Therapy Treatment Patient Details Name: Jeremy Medina. MRN: [id number] DOB: [birth year] Today's Date: 10/15/2022   History of Present Illness Jeremy Medina. is a 72 y.o. male who presents complaining of altered mental status and progressive decline.  MRI performed at [facility], concern for hydrocephalus and neurosurgeon recommended pt present to ED. Work-up pending. LP 10/03/2022. PMHx: type 2 diabetes, PTSD, chronic pain on methadone per son, hypertension, hyperlipidemia    PT Comments    Pt admitted with above diagnosis. Pt overall not following commands today.  Pt usually converses some with PT butdid not speak to PT today at all.  Pt close to total assist to sit to eOB and max assist of 2 to stand with max gestures to get pt to assist a little. Decided to place pt back in bed at end of treatment for his safety.  Decr frequency to 2x/week as pt is not making progress and plans for for SNF.   Has met 0/4 goals set therefore goals revised today. Goals downgraded due to poor progress toward goals. Pt currently with functional limitations due to balance and endurance deficits. Pt will benefit from acute skilled PT to increase their independence and safety with mobility to allow discharge.      Recommendations for follow up therapy are one component of a multi-disciplinary discharge planning process, led by the attending physician.  Recommendations may be updated based on patient status, additional functional criteria and insurance authorization.  Follow Up Recommendations  Can patient physically be transported by private vehicle: No    Assistance Recommended at Discharge Frequent or constant Supervision/Assistance  Patient can return home with the following Two people to help with walking and/or transfers;Assistance with cooking/housework;Help with stairs or ramp for entrance;Assist for transportation   Equipment Recommendations  Other (comment) (TBA)    Recommendations for Other  Services       Precautions / Restrictions Precautions Precautions: Fall Precaution Comments: posterior lean Restrictions Weight Bearing Restrictions: No     Mobility  Bed Mobility Overal bed mobility: Needs Assistance Bed Mobility: Supine to Sit Rolling: Max assist Sidelying to sit: +2 for physical assistance, Max assist   Sit to supine: +2 for physical assistance, Total assist   General bed mobility comments: Pt needed incr assist to come to eOB with total assist to initiate movement as pt not following commands today. Noted pt with BM and decided to stand him to clean him.  Ultimately had to lie pt back down after 2 sttands as pt couldnt stand up enough to be cleaned and was not following commands today.  Pt leaning to left and needing assist to hold onto end of bed with right hand to right self. Toward end of session pt leaning posterior and right and needed mod assist most of time to sit up on EOB.  Difficult to keep pt sitting as he is impulsive and cannot follow commands to sit upright.    Transfers Overall transfer level: Needs assistance Equipment used: Rolling walker (2 wheels), 1 person hand held assist Transfers: Sit to/from Stand Sit to Stand: Max assist, +2 physical assistance, +2 safety/equipment, From elevated surface           General transfer comment: Pt able to stand to feet with bil hands on RW to pull up as pt would not follow commands to push from bed. Pt did stand and cleared buttocks off bed however all weight was posterior on his feet and he couldnt maintain standing. Attempted to  stand again and pt balance was worse. Decided to lie pt back down and clean him. Pt with feet sliding in standing and pt is completely unattentive to his LE's.    Ambulation/Gait                   Stairs             Wheelchair Mobility    Modified Rankin (Stroke Patients Only)       Balance Overall balance assessment: Needs assistance Sitting-balance  support: Feet supported, Bilateral upper extremity supported Sitting balance-Leahy Scale: Poor Sitting balance - Comments: strong posterior lean and unaware of his posture and needs for assistance. Postural control: Posterior lean Standing balance support: Bilateral upper extremity supported, Single extremity supported, During functional activity Standing balance-Leahy Scale: Poor Standing balance comment: could not stand fully upright with RW as well as +2 max assist                            Cognition Arousal/Alertness: Awake/alert Behavior During Therapy: Flat affect Overall Cognitive Status: Difficult to assess Area of Impairment: Problem solving, Awareness, Safety/judgement, Following commands, Attention                 Orientation Level: Situation, Time Current Attention Level: Selective Memory: Decreased recall of precautions, Decreased short-term memory Following Commands: Follows one step commands inconsistently, Follows one step commands with increased time Safety/Judgement: Decreased awareness of safety, Decreased awareness of deficits Awareness: Intellectual Problem Solving: Slow processing, Requires verbal cues, Requires tactile cues General Comments: not speaking to PT today at all        Exercises Other Exercises Other Exercises: Pt not following commands today.    General Comments        Pertinent Vitals/Pain Pain Assessment Pain Assessment: Faces Faces Pain Scale: Hurts whole lot Pain Location: intermittent grimacing Pain Descriptors / Indicators: Grimacing Pain Intervention(s): Limited activity within patient's tolerance, Monitored during session, Repositioned    Home Living                          Prior Function            PT Goals (current goals can now be found in the care plan section) Acute Rehab PT Goals Patient Stated Goal: unable to state PT Goal Formulation: Patient unable to participate in goal setting Time  For Goal Achievement: 10/29/22 Potential to Achieve Goals: Fair Progress towards PT goals: Not progressing toward goals - comment (Pt with decr ability to follow commands)    Frequency    Min 2X/week      PT Plan Current plan remains appropriate;Frequency needs to be updated    Co-evaluation              AM-PAC PT "6 Clicks" Mobility   Outcome Measure  Help needed turning from your back to your side while in a flat bed without using bedrails?: Total Help needed moving from lying on your back to sitting on the side of a flat bed without using bedrails?: Total Help needed moving to and from a bed to a chair (including a wheelchair)?: Total Help needed standing up from a chair using your arms (e.g., wheelchair or bedside chair)?: Total Help needed to walk in hospital room?: Total Help needed climbing 3-5 steps with a railing? : Total 6 Click Score: 6    End of Session Equipment Utilized During Treatment: Gait  belt Activity Tolerance: Patient limited by fatigue Patient left: in bed;with bed alarm set;with call bell/phone within reach Nurse Communication: Mobility status;Need for lift equipment PT Visit Diagnosis: Unsteadiness on feet (R26.81);Muscle weakness (generalized) (M62.81)     Time: 4098-1191 PT Time Calculation (min) (ACUTE ONLY): 24 min  Charges:  $Therapeutic Activity: 23-37 mins                     [facility] M,PT Acute Rehab Services [phone]    Bevelyn Buckles 10/15/2022, 2:15 PM

## 2022-10-15 NOTE — Plan of Care (Signed)
Patient alert but disoriented x 4, delayed responses to following commands.  Non-verbal.  VSS throughout shift.  Diminished lungs, IS encouraged.  All meds given on time as ordered.  Purewick remains in place.  POC maintained, will contiue to monitor.  Problem: Education: Goal: Ability to describe self-care measures that may prevent or decrease complications (Diabetes Survival Skills Education) will improve Outcome: Progressing Goal: Individualized Educational Video(s) Outcome: Progressing   Problem: Coping: Goal: Ability to adjust to condition or change in health will improve Outcome: Progressing   Problem: Fluid Volume: Goal: Ability to maintain a balanced intake and output will improve Outcome: Progressing   Problem: Health Behavior/Discharge Planning: Goal: Ability to identify and utilize available resources and services will improve Outcome: Progressing Goal: Ability to manage health-related needs will improve Outcome: Progressing   Problem: Metabolic: Goal: Ability to maintain appropriate glucose levels will improve Outcome: Progressing   Problem: Nutritional: Goal: Maintenance of adequate nutrition will improve Outcome: Progressing Goal: Progress toward achieving an optimal weight will improve Outcome: Progressing   Problem: Skin Integrity: Goal: Risk for impaired skin integrity will decrease Outcome: Progressing   Problem: Tissue Perfusion: Goal: Adequacy of tissue perfusion will improve Outcome: Progressing   Problem: Education: Goal: Knowledge of General Education information will improve Description: Including pain rating scale, medication(s)/side effects and non-pharmacologic comfort measures Outcome: Progressing   Problem: Health Behavior/Discharge Planning: Goal: Ability to manage health-related needs will improve Outcome: Progressing   Problem: Clinical Measurements: Goal: Ability to maintain clinical measurements within normal limits will  improve Outcome: Progressing Goal: Will remain free from infection Outcome: Progressing Goal: Diagnostic test results will improve Outcome: Progressing Goal: Respiratory complications will improve Outcome: Progressing Goal: Cardiovascular complication will be avoided Outcome: Progressing   Problem: Activity: Goal: Risk for activity intolerance will decrease Outcome: Progressing   Problem: Nutrition: Goal: Adequate nutrition will be maintained Outcome: Progressing   Problem: Coping: Goal: Level of anxiety will decrease Outcome: Progressing   Problem: Elimination: Goal: Will not experience complications related to bowel motility Outcome: Progressing Goal: Will not experience complications related to urinary retention Outcome: Progressing   Problem: Pain Managment: Goal: General experience of comfort will improve Outcome: Progressing   Problem: Safety: Goal: Ability to remain free from injury will improve Outcome: Progressing   Problem: Skin Integrity: Goal: Risk for impaired skin integrity will decrease Outcome: Progressing

## 2022-10-15 NOTE — Progress Notes (Signed)
Speech Language Pathology Treatment: Dysphagia  Patient Details Name: Jeremy Medina. MRN: [id number] DOB: 11/24/1950 Today's Date: 10/15/2022 Time: 4098-1191 SLP Time Calculation (min) (ACUTE ONLY): 22 min  Assessment / Plan / Recommendation Clinical Impression  Pt seen for f/u diet check/dysphagia tx with various consistencies with an overall cognitive-based dysphagia noted c/b delayed onset of swallowing, oral holding, prolonged oral transit/propulsion.  No overt s/sx of aspiration observed throughout trial with pt agreeable (with Mod cues) to consuming limited amounts of various consistencies to determine if current diet is adequate/appropriate given impaired cognitive status.  Pt responded with gestures inconsistently when asked questions regarding oral consumption/preferences.  Pt either turned head or shook head "no" when asked if he wanted to consume greater amount of current consistency.  Pt unable to achieve negative pressure orally for straw use, but did purse lips during attempt to consume via cup edge.  Pt with decreased mastication with soft solids and ice chips, but did swallow efficiently with assessed consistencies given moderate visual/verbal cues.  Recommend continuing current diet of Dysphagia 3/thin liquids with FULL assistance/supervision for feeding/following aspiration precautions during consumption including small bites/sips,decreasing environmental distractions and slow rate/watch for swallow prior to initiating next bite/sip.  ST will s/o in this setting for dysphagia tx.  Please re-consult prn if changes occur from a cognitive/dysphagia standpoint.  Thank you for allowing [place] to care for this patient.  HPI HPI: Patient is a 72 y.o. male with PMH: HTN, DM, chronic pain on methadone, PTSD/depression/anxiety on daily benzodiazepine. An EGD performed on 09/06/22 showed Duodenitis with focal erosion and reactive/regenerative glandular changes, Chronic active H. pylori gastritis.  Patient presented to the hospital on 09/28/2022 with AMS and progressive decline. MRI performed at [facility] concerning for hydrocephalus and neurosurgeon recommended patient present to ED. LP performed on 4/17 and was concerning for fungal PNA. 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 SLP swallow evaluation completed and pt placed on Dysphagia 3(mech/soft)/thin liquid diet.  ST f/u for diet tolerance/dysphagia tx.      SLP Plan  Discharge SLP treatment due to (comment)goals met; pt tolerating current diet      Recommendations for follow up therapy are one component of a multi-disciplinary discharge planning process, led by the attending physician.  Recommendations may be updated based on patient status, additional functional criteria and insurance authorization.    Recommendations  Diet recommendations: Dysphagia 3 (mechanical soft);Thin liquid Liquids provided via: Cup Medication Administration: Whole meds with puree Supervision: Full supervision/cueing for compensatory strategies;Trained caregiver to feed patient Compensations: Slow rate;Small sips/bites;Minimize environmental distractions Postural Changes and/or Swallow Maneuvers: Seated upright 90 degrees                  Oral care BID;Staff/trained caregiver to provide oral care   Frequent or constant Supervision/Assistance Dysphagia, unspecified (R13.10)     Discharge SLP treatment due to (comment)     Pat Corlette Ciano,M.S., CCC-SLP  10/15/2022, 11:26 AM

## 2022-10-15 NOTE — Inpatient Diabetes Management (Signed)
Inpatient Diabetes Program Recommendations  AACE/ADA: New Consensus Statement on Inpatient Glycemic Control (2015)  Target Ranges:  Prepandial:   less than 140 mg/dL      Peak postprandial:   less than 180 mg/dL (1-2 hours)      Critically ill patients:  140 - 180 mg/dL    Latest Reference Range & Units 10/14/22 07:58 10/14/22 12:07 10/14/22 16:40 10/14/22 21:22  Glucose-Capillary 70 - 99 mg/dL 409 (H)  15 units Novolog @1040   36 units Semglee @1040  315 (H)  11 units Novolog @1355  210 (H)  5 units Novolog  171 (H)  (H): Data is abnormally high  Latest Reference Range & Units 10/15/22 08:30  Glucose-Capillary 70 - 99 mg/dL 811 (H)  11 units Novolog  42 units Semglee  (H): Data is abnormally high     Current Orders: Semglee 42 units Daily     Novolog Moderate Correction Scale/ SSI (0-15 units) TID AC + HS      MD- Note pt getting Solumedrol 1000 mg Daily X 5 days (Today is Day 3 of 5)  Semglee dose increased this AM   Unsure how well pt is eating??    Could we increase the frequency of the Novolog SSI to Q4 hours while pt getting the steroids?    --Will follow patient during hospitalization--  Ambrose Finland RN, MSN, CDCES Diabetes Coordinator Inpatient Glycemic Control Team Team Pager: [phone] (8a-5p)

## 2022-10-16 DIAGNOSIS — G030 Nonpyogenic meningitis: Secondary | ICD-10-CM | POA: Diagnosis not present

## 2022-10-16 LAB — COMPREHENSIVE METABOLIC PANEL
ALT: 128 U/L — ABNORMAL HIGH (ref 0–44)
AST: 63 U/L — ABNORMAL HIGH (ref 15–41)
Albumin: 3.1 g/dL — ABNORMAL LOW (ref 3.5–5.0)
Alkaline Phosphatase: 71 U/L (ref 38–126)
Anion gap: 12 (ref 5–15)
BUN: 48 mg/dL — ABNORMAL HIGH (ref 8–23)
CO2: 19 mmol/L — ABNORMAL LOW (ref 22–32)
Calcium: 9.7 mg/dL (ref 8.9–10.3)
Chloride: 109 mmol/L (ref 98–111)
Creatinine, Ser: 1.54 mg/dL — ABNORMAL HIGH (ref 0.61–1.24)
GFR, Estimated: 48 mL/min — ABNORMAL LOW (ref >60)
Glucose, Bld: 177 mg/dL — ABNORMAL HIGH (ref 70–99)
Potassium: 3.5 mmol/L (ref 3.5–5.1)
Sodium: 140 mmol/L (ref 135–145)
Total Bilirubin: 0.9 mg/dL (ref 0.3–1.2)
Total Protein: 6.1 g/dL — ABNORMAL LOW (ref 6.5–8.1)

## 2022-10-16 LAB — CBC
HCT: 35.1 % — ABNORMAL LOW (ref 39.0–52.0)
Hemoglobin: 12.5 g/dL — ABNORMAL LOW (ref 13.0–17.0)
MCH: 28.2 pg (ref 26.0–34.0)
MCHC: 35.6 g/dL (ref 30.0–36.0)
MCV: 79.1 fL — ABNORMAL LOW (ref 80.0–100.0)
Platelets: 246 10*3/uL (ref 150–400)
RBC: 4.44 MIL/uL (ref 4.22–5.81)
RDW: 15.5 % (ref 11.5–15.5)
WBC: 19.9 10*3/uL — ABNORMAL HIGH (ref 4.0–10.5)
nRBC: 0 % (ref 0.0–0.2)

## 2022-10-16 LAB — GLUCOSE, CAPILLARY
Glucose-Capillary: 187 mg/dL — ABNORMAL HIGH (ref 70–99)
Glucose-Capillary: 185 mg/dL — ABNORMAL HIGH (ref 70–99)
Glucose-Capillary: 192 mg/dL — ABNORMAL HIGH (ref 70–99)
Glucose-Capillary: 185 mg/dL — ABNORMAL HIGH (ref 70–99)
Glucose-Capillary: 374 mg/dL — ABNORMAL HIGH (ref 70–99)

## 2022-10-16 LAB — MISC LABCORP TEST (SEND OUT): Labcorp test code: 9985

## 2022-10-16 LAB — MAGNESIUM: Magnesium: 1.7 mg/dL (ref 1.7–2.4)

## 2022-10-16 LAB — ANAEROBIC CULTURE W GRAM STAIN

## 2022-10-16 LAB — KARIUS TEST

## 2022-10-16 MED ORDER — ENOXAPARIN SODIUM 60 MG/0.6ML IJ SOSY
50.0000 mg | PREFILLED_SYRINGE | INTRAMUSCULAR | Status: DC
  Administered 2022-10-16 – 2022-10-20 (×5): 50 mg via SUBCUTANEOUS
  Filled 2022-10-16 (×5): qty 0.6

## 2022-10-16 MED ORDER — POTASSIUM CHLORIDE 20 MEQ PO PACK
40.0000 meq | PACK | Freq: Once | ORAL | Status: AC
  Administered 2022-10-16: 40 meq via ORAL
  Filled 2022-10-16: qty 2

## 2022-10-16 MED ORDER — MAGNESIUM SULFATE 2 GM/50ML IV SOLN
2.0000 g | Freq: Once | INTRAVENOUS | Status: AC
  Administered 2022-10-16: 2 g via INTRAVENOUS
  Filled 2022-10-16: qty 50

## 2022-10-16 NOTE — Progress Notes (Signed)
Occupational Therapy Treatment Patient Details Name: Jeremy Medina. MRN: [id number] DOB: [birth year] Today's Date: 10/16/2022   History of present illness Jeremy Dill. is a 72 y.o. male who presents complaining of altered mental status and progressive decline.  MRI performed at [facility], concern for hydrocephalus and neurosurgeon recommended pt present to ED. Work-up pending. LP 10/03/2022. PMHx: type 2 diabetes, PTSD, chronic pain on methadone per son, hypertension, hyperlipidemia   OT comments  Pt is not making functional progress towards their acute OT goals, care plan goals updated to reflect. Pt's son present at bed side and assisting. Pt made no attempt at verbalization this date and did not follow any commands and didt not visually attend therapist, stimuli or his son. Overall he was max/total A for bed mobility and sitting EOB to correct strong posterior lean. BUE ROM exercises attempted however pt actively resisting. OT to continue to follow acutely to facilitate progress towards established goals. Pt will continue to benefit from skilled inpatient follow up therapy, <3 hours/day.    Recommendations for follow up therapy are one component of a multi-disciplinary discharge planning process, led by the attending physician.  Recommendations may be updated based on patient status, additional functional criteria and insurance authorization.    Assistance Recommended at Discharge Frequent or constant Supervision/Assistance  Patient can return home with the following  A lot of help with walking and/or transfers;Two people to help with walking and/or transfers;A lot of help with bathing/dressing/bathroom;Two people to help with bathing/dressing/bathroom;Direct supervision/assist for medications management;Direct supervision/assist for financial management;Assist for transportation;Help with stairs or ramp for entrance;Assistance with feeding;Assistance with cooking/housework   Equipment  Recommendations  None recommended by OT       Precautions / Restrictions Precautions Precautions: Fall Precaution Comments: posterior lean Restrictions Weight Bearing Restrictions: No       Mobility Bed Mobility Overal bed mobility: Needs Assistance Bed Mobility: Sit to Supine     Supine to sit: Max assist Sit to supine: Max assist        Transfers Overall transfer level: Needs assistance                 General transfer comment: not abel to attempt with +1 and posterior bias     Balance Overall balance assessment: Needs assistance Sitting-balance support: Feet supported, Bilateral upper extremity supported Sitting balance-Leahy Scale: Poor Sitting balance - Comments: strong posterior bias                                   ADL either performed or assessed with clinical judgement   ADL Overall ADL's : Needs assistance/impaired Eating/Feeding: Maximal assistance Eating/Feeding Details (indicate cue type and reason): pts son assisting with drinking an ensure upon arrival                                 Functional mobility during ADLs: Maximal assistance General ADL Comments: not following commands and resisting movenent. max A to get to EOB and for posterior bias    Extremity/Trunk Assessment Upper Extremity Assessment Upper Extremity Assessment: RUE deficits/detail;LUE deficits/detail RUE Deficits / Details: rigid, stiff, resisting movement/assessment LUE Deficits / Details: rigid, stiff, resisting movement/assessment   Lower Extremity Assessment Lower Extremity Assessment: Defer to PT evaluation        Vision   Vision Assessment?: No apparent visual deficits Additional Comments: did  not visually attend to therapist or stimuli   Perception Perception Perception: Not tested   Praxis Praxis Praxis: Not tested    Cognition Arousal/Alertness: Awake/alert Behavior During Therapy: Flat affect Overall Cognitive Status:  Difficult to assess                                 General Comments: did not follow any commands or visually attend to therapist        Exercises Other Exercises Other Exercises: Attempted AAROM of BUE, pt actively resisting    Shoulder Instructions       General Comments pt's son at EOB and assisting    Pertinent Vitals/ Pain       Pain Assessment Pain Assessment: Faces Faces Pain Scale: Hurts a little bit Pain Location: grimacing with movmenet Pain Descriptors / Indicators: Grimacing Pain Intervention(s): Limited activity within patient's tolerance, Monitored during session   Frequency  Min 1X/week        Progress Toward Goals  OT Goals(current goals can now be found in the care plan section)  Progress towards OT goals: Not progressing toward goals - comment  Acute Rehab OT Goals Patient Stated Goal: per son, back to baseline OT Goal Formulation: Patient unable to participate in goal setting Time For Goal Achievement: 10/27/22 Potential to Achieve Goals: Good ADL Goals Pt Will Perform Grooming: with max assist;sitting Pt Will Perform Upper Body Dressing: with max assist;standing Pt Will Transfer to Toilet: with max assist;stand pivot transfer;bedside commode Additional ADL Goal #1: Pt will follow 1 step commands 50% of the time to complete ADL task  Plan Discharge plan remains appropriate       AM-PAC OT "6 Clicks" Daily Activity     Outcome Measure   Help from another person eating meals?: Total Help from another person taking care of personal grooming?: A Lot Help from another person toileting, which includes using toliet, bedpan, or urinal?: A Lot Help from another person bathing (including washing, rinsing, drying)?: A Lot Help from another person to put on and taking off regular upper body clothing?: A Lot Help from another person to put on and taking off regular lower body clothing?: A Lot 6 Click Score: 11    End of Session     OT Visit Diagnosis: Unsteadiness on feet (R26.81);Other abnormalities of gait and mobility (R26.89);Muscle weakness (generalized) (M62.81);Pain   Activity Tolerance Patient tolerated treatment well   Patient Left in bed;with call bell/phone within reach   Nurse Communication Mobility status        Time: 1610-9604 OT Time Calculation (min): 18 min  Charges: OT General Charges $OT Visit: 1 Visit OT Treatments $Therapeutic Activity: 8-22 mins  Derenda Mis, OTR/L Acute Rehabilitation Services Office [phone] Secure Chat Communication Preferred   Donia Pounds 10/16/2022, 4:45 PM
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
## 2022-10-16 NOTE — Progress Notes (Signed)
Progress Note   Patient: Jeremy Medina. BJY:[id number] DOB: 1951/06/16 DOA: 09/28/2022     14 DOS: the patient was seen and examined on 10/16/2022 at 9:15AM   Brief hospital course: [name]. is a 72 y.o. M with hx HTN, DM, chronic pain on methadone, PTSD/depression/anxiety on daily benzodiazepine who presented with concern for NPH.  Evidently recently admitted at [facility] for subacute encephalopathy (beginning ~Jan, 3 months PTA).  Work up there concerning for NPH, discharged to home with Neurology and Neurosurgery follow ups.  On day of admission, was seen in Neurosurgery clinic and sent to the ER for LP.  Incidentally found in the ER to have AKI Cr 2.3.    4/12: Admitted 4/14: Cr normalized, persistent encephalopathy noted 4/15: Neurology consulted 4/17: LP obtained 4/18: ID consulted, amphotericin started 4/19: Patient more alert 4/20-4/24--intermittent waxing waning mentation 4/25 immediately post LP more lucid-conversant short sentences---Fungal meningitis Etiology now questioned--LP shows 29 WBC on champagne tap---Broadened Abx coverage 4/26.  Broad coverage was discontinued by ID-still awaiting labs-long-term EEG planned for tonight  4/27 EEG grossly negative, amphotericin B stopped, IV fluids reinitiated 2/2 AKI, IV steroids Solu-Medrol started, working up autoimmune encephalitis and getting ANA and other autoimmune panel 4/28 CT chest abdomen pelvis negative for any gross pathology other than potentially some mild proctitis which may be an overcall 4/30-neurosurgery consulted-not a candidate for shunt placement based on discussions with them, palliative care consulted for goals of care   Assessment and Plan: Suspected fungal meningitis Progressive subacute encephalopathy Progressive since January-7 urgent care visits -initially--felt to have normal pressure hydrocephalus at the [place] a admitted here from neurosurgery office with concerns for alternate causes and none  could be found- -- initially nonverbal, during first several days in hospital.    CT head and MRV showed no acute process.   No improvement with IV thiamine.   Neurology consulted, initial LP obtained that showed elevated protein and WBC.   No substantial improvement with initial LP, Neurology suspected fungal meningitis.   LP performed again 4/25--34 cm water opening pressure patient evaluated subsequent to procedure and mentation improved he was verbalizing more felt by neurology to be secondary to relief of CSF pressure -Amphotericin discontinued 4/27, steroids as per neurology -Long-term EEG non revealing -ESR 45 CRP less than 0.5 rheumatoid factor, ANA are negative -From 4/25 LP: Encephalitis panel negative, CSF Gram stain NGTD fungal CSF negative Pending oligoclonal bands, VDRL CSF, multiple send out labs, anaerobic culture pending  Rare atypical cells present in spinal fluid-unclear significance --did not get a chance to call pathology at [facility] long the pathologist to read this-attending physician in a.m. to reach out regarding this - CT chest abdomen pelvis overall not very impressive -Etiology appears to continue to be cryptogenic -See my discussion as below  Vomiting Does not seem to be present now-episodic in nature CT abdomen pelvis?  Proctitis but overall exam is benign laxative MiraLAX + senna at bedtime  Acute kidney injury 4/29 bladder scan 71, worsening azotemia back-and-forth IVF rate increased to 125 cc/H +30 of K --follow labs a.m. Hold lisinopril  Chronic pain syndrome - Wean methadone cut back to 5 3 times daily on 4/29 to see if it made any difference which has not Meperidine diiscontinued 4/24  Transaminitis Mild.  Hepatitis serologies negative.  Probably NASH given normal [place]. -Monitor trends of LFTs-no real medications seem to be putative etiology  -Ammonia was negative when tested and although we gave a trial of lactulose this  is not indicated as he does not  get better  Diabetes mellitus type 2, controlled, without complications Glucose elevated  - Continue sliding scale corrections, CBGs 1 80-200 today and worsening glycemic control secondary to high-dose steroids Lantus increased to 42 units and sliding scale continues - Hold home 70/30 - Hold home Crestor  Benign essential hypertension BP elevated  - Continue amlodipine 10mg  - Home lisinopril held  Obesity (BMI 30-39.9) BMI 32  Folate deficiency B12 deficiency ruled out - Continue folate supplement  Anxiety - Ativan DC 4/28, DC Benadryl which was previously given for amphotericin B coadministration   Subjective:   Remains relatively unresponsive Cannot feed himself when I saw him-could not manipulate banana which I placed into his hand To eat To me patient is non-responsive and doesn't verbalize--looks uncomfortable a   Physical Exam: BP (!) 170/92 (BP Location: Left Arm)   Pulse (!) 113   Temp 98.3 F (36.8 C) (Oral)   Resp 18   Ht 5\' 10"  (1.778 m)   Wt 102.1 kg   SpO2 98%   BMI 32.28 kg/m    Still densely encephalopathic-tracks fairly well but is still quite confused not verbalizing Cta b no added sound Abd soft no rebound no guarding seems relatively soft-slight upper quadrant tenderness right side Reflexes 2/3 in knees No other issues    Family Communication: Discussed with son on 4/30--patient's son voices to me that prior to all this happening at the [place] he was having stumbling spells, he was losing continence of urine and he was unable to maintain gait and was confused He has lost about 70 pounds since that time-he cannot perform IADLs ADLs etc. I shared with the son current management up-to-date in addition to labs that are pending-ensure that this may be inflammatory, infectious and possibly secondary to NPH I also telephoned Dr. Conchita Paris after I discussed with the son and he mentions to me that the patient is a poor candidate for shunt because there is  something either inflammatory infectious going on in the patient's brain and placing a shunt would only cause the shunt to clog up and would not be a good idea With all these things in mind I have discussed the rare possibility of the patient declining over the next several days even further, I do not think that doing another LP is indicated as his encephalopathy is dense I have asked palliative care to come in and consolidate goals of care he remains a full code at this time after discussion with the family  Disposition: unknown Status is: Inpatient  no clear discernible endpoint insight  Author: Rhetta Mura, MD 10/16/2022 6:15 PM  For on call review [email or web address].

## 2022-10-16 NOTE — Plan of Care (Signed)
  Problem: Education: Goal: Ability to describe self-care measures that may prevent or decrease complications (Diabetes Survival Skills Education) will improve Outcome: Progressing   Problem: Coping: Goal: Ability to adjust to condition or change in health will improve Outcome: Progressing   Problem: Fluid Volume: Goal: Ability to maintain a balanced intake and output will improve Outcome: Progressing   Problem: Health Behavior/Discharge Planning: Goal: Ability to identify and utilize available resources and services will improve Outcome: Progressing   Problem: Metabolic: Goal: Ability to maintain appropriate glucose levels will improve Outcome: Progressing   Problem: Nutritional: Goal: Maintenance of adequate nutrition will improve Outcome: Not Progressing   Problem: Skin Integrity: Goal: Risk for impaired skin integrity will decrease Outcome: Progressing   Problem: Tissue Perfusion: Goal: Adequacy of tissue perfusion will improve Outcome: Progressing

## 2022-10-17 DIAGNOSIS — G030 Nonpyogenic meningitis: Secondary | ICD-10-CM | POA: Diagnosis not present

## 2022-10-17 DIAGNOSIS — G9340 Encephalopathy, unspecified: Secondary | ICD-10-CM | POA: Diagnosis not present

## 2022-10-17 DIAGNOSIS — E876 Hypokalemia: Secondary | ICD-10-CM

## 2022-10-17 DIAGNOSIS — R627 Adult failure to thrive: Secondary | ICD-10-CM

## 2022-10-17 DIAGNOSIS — F419 Anxiety disorder, unspecified: Secondary | ICD-10-CM | POA: Diagnosis not present

## 2022-10-17 DIAGNOSIS — Z515 Encounter for palliative care: Secondary | ICD-10-CM | POA: Diagnosis not present

## 2022-10-17 DIAGNOSIS — N179 Acute kidney failure, unspecified: Secondary | ICD-10-CM | POA: Diagnosis not present

## 2022-10-17 DIAGNOSIS — R1310 Dysphagia, unspecified: Secondary | ICD-10-CM

## 2022-10-17 DIAGNOSIS — Z7189 Other specified counseling: Secondary | ICD-10-CM | POA: Diagnosis not present

## 2022-10-17 LAB — COMPREHENSIVE METABOLIC PANEL
ALT: 130 U/L — ABNORMAL HIGH (ref 0–44)
AST: 48 U/L — ABNORMAL HIGH (ref 15–41)
Albumin: 2.9 g/dL — ABNORMAL LOW (ref 3.5–5.0)
Alkaline Phosphatase: 66 U/L (ref 38–126)
Anion gap: 10 (ref 5–15)
BUN: 43 mg/dL — ABNORMAL HIGH (ref 8–23)
CO2: 23 mmol/L (ref 22–32)
Calcium: 9.4 mg/dL (ref 8.9–10.3)
Chloride: 112 mmol/L — ABNORMAL HIGH (ref 98–111)
Creatinine, Ser: 1.47 mg/dL — ABNORMAL HIGH (ref 0.61–1.24)
GFR, Estimated: 51 mL/min — ABNORMAL LOW (ref >60)
Glucose, Bld: 120 mg/dL — ABNORMAL HIGH (ref 70–99)
Potassium: 3.3 mmol/L — ABNORMAL LOW (ref 3.5–5.1)
Sodium: 145 mmol/L (ref 135–145)
Total Bilirubin: 0.7 mg/dL (ref 0.3–1.2)
Total Protein: 5.7 g/dL — ABNORMAL LOW (ref 6.5–8.1)

## 2022-10-17 LAB — CBC
HCT: 34.8 % — ABNORMAL LOW (ref 39.0–52.0)
HCT: 34.5 % — ABNORMAL LOW (ref 39.0–52.0)
Hemoglobin: 12.4 g/dL — ABNORMAL LOW (ref 13.0–17.0)
Hemoglobin: 12.1 g/dL — ABNORMAL LOW (ref 13.0–17.0)
MCH: 28.4 pg (ref 26.0–34.0)
MCH: 28.3 pg (ref 26.0–34.0)
MCHC: 35.6 g/dL (ref 30.0–36.0)
MCHC: 35.1 g/dL (ref 30.0–36.0)
MCV: 79.8 fL — ABNORMAL LOW (ref 80.0–100.0)
MCV: 80.8 fL (ref 80.0–100.0)
Platelets: 214 10*3/uL (ref 150–400)
Platelets: 189 10*3/uL (ref 150–400)
RBC: 4.36 MIL/uL (ref 4.22–5.81)
RBC: 4.27 MIL/uL (ref 4.22–5.81)
RDW: 15.7 % — ABNORMAL HIGH (ref 11.5–15.5)
RDW: 16 % — ABNORMAL HIGH (ref 11.5–15.5)
WBC: 11 10*3/uL — ABNORMAL HIGH (ref 4.0–10.5)
WBC: 11.4 10*3/uL — ABNORMAL HIGH (ref 4.0–10.5)
nRBC: 0 % (ref 0.0–0.2)
nRBC: 0 % (ref 0.0–0.2)

## 2022-10-17 LAB — MAGNESIUM: Magnesium: 1.7 mg/dL (ref 1.7–2.4)

## 2022-10-17 LAB — GLUCOSE, CAPILLARY
Glucose-Capillary: 114 mg/dL — ABNORMAL HIGH (ref 70–99)
Glucose-Capillary: 125 mg/dL — ABNORMAL HIGH (ref 70–99)
Glucose-Capillary: 126 mg/dL — ABNORMAL HIGH (ref 70–99)
Glucose-Capillary: 140 mg/dL — ABNORMAL HIGH (ref 70–99)
Glucose-Capillary: 163 mg/dL — ABNORMAL HIGH (ref 70–99)
Glucose-Capillary: 262 mg/dL — ABNORMAL HIGH (ref 70–99)
Glucose-Capillary: 95 mg/dL (ref 70–99)

## 2022-10-17 LAB — OLIGOCLONAL BANDS, CSF + SERM

## 2022-10-17 MED ORDER — POTASSIUM CHLORIDE CRYS ER 20 MEQ PO TBCR
40.0000 meq | EXTENDED_RELEASE_TABLET | Freq: Once | ORAL | Status: DC
  Filled 2022-10-17: qty 2

## 2022-10-17 MED ORDER — POTASSIUM CHLORIDE 10 MEQ/100ML IV SOLN: 10.0000 meq | INTRAVENOUS | Status: DC

## 2022-10-17 NOTE — Progress Notes (Signed)
Ok to give kdur x1 again for low k per dr Tyson Babinski.  Ulyses Southward, PharmD, BCIDP, AAHIVP, CPP Infectious Disease Pharmacist 10/17/2022 10:48 AM
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
## 2022-10-17 NOTE — Progress Notes (Signed)
PROGRESS NOTE    Jeremy Medina.  ZOX:[id number] DOB: July 24, 1950 DOA: 09/28/2022 PCP: [facility]    Brief Narrative:   Jeremy Medina. is a 72 y.o. 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  He was discharged home with neurology and neurosurgery follow-up.  This time patient presented with AKI and had to see neurosurgery clinic and was sent to the ER for LP.    Sequence of events as below.      4/12: Admitted 4/14: Cr normalized, persistent encephalopathy noted 4/15: Neurology consulted 4/17: LP obtained 4/18: ID consulted, amphotericin started 4/19: Patient more alert 4/20-4/24--intermittent waxing waning mentation 4/25 immediately post LP more lucid-conversant short sentences---Fungal meningitis Etiology now questioned--LP shows 29 WBC on champagne tap---Broadened Abx coverage 4/26.  Broad coverage was discontinued by ID-still awaiting labs-long-term EEG planned for tonight  4/27 EEG grossly negative, amphotericin B stopped, IV fluids reinitiated 2/2 AKI, IV steroids Solu-Medrol started, working up autoimmune encephalitis and getting ANA and other autoimmune panel 4/28 CT chest abdomen pelvis negative for any gross pathology other than potentially some mild proctitis which may be an overcall 4/30-neurosurgery consulted-not a candidate for shunt placement based on discussions with them, palliative care consulted for goals of care    Assessment and Plan:  Suspected fungal meningitis Progressive subacute encephalopathy Progressive encephalopathy since January.  Patient was admitted from neurosurgery office for concerns of mental status changes. CT head and MRV showed no acute process.   No improvement with IV thiamine.  Initial LP obtained that showed elevated protein and WBC.  No substantial  improvement with initial LP, Neurology suspected fungal meningitis.  LP performed again 4/25--34 cm water opening pressure patient evaluated subsequent to procedure and mentation improved he was verbalizing more felt by neurology to be secondary to relief of CSF pressure.  Patient was initially on Amphotericin which was discontinued.  Continue long-term EEG. ESR 45 CRP less than 0.5 rheumatoid factor, ANA are negative. From 4/25 LP: Encephalitis panel negative, CSF Gram stain NGTD fungal CSF negative.  Pending oligoclonal bands.  CT scan of the chest abdomen pelvis negative.  Neurosurgery indicated that the patient is a poor candidate for shunt placement.   Cytology of CSF -- no malignant cells Crypto antigen -- negative Meningitis PCR panel -- negative Fungitell -- pending Bastomyces Antigen serum -- pending Coccidiodes Abs -- pending Quant gold -- negative MT-RIF NAA with culture TB testing from CSF -- pending Karius test -- pending Blasto IgG CSF -- pending Histo IgG CSF -- pending Blasto antigen CSF -- pending VDRL -- negative  Vomiting None at this time.  Recent CT scan with proctitis.   Acute kidney injury Recent creatinine of 1.3.  Was on IV fluids.   Chronic pain syndrome Methadone was cut down to 5 mg 3 times daily on 4/29.   Transaminitis Likely NASH given normal [place].  Check CMP in AM.   Diabetes mellitus type 2, controlled, without complications On 70/30 insulin at home.  Currently on Lantus and sliding scale insulin.  Latest POC glucose of 124.  Benign essential hypertension Continue amlodipine.  Lisinopril on hold.  Obesity (BMI 30-39.9) BMI 32.  Might benefit from weight loss as outpatient.   Folate deficiency B12 deficiency.  Continue folate supplement.   Anxiety Was on Ativan which was discontinued 4/28.   Goals of  care discussion.  Palliative care on board.  Likely continued decline on this patient.    DVT prophylaxis: Place and maintain sequential  compression device Start: 10/02/22 1610   Code Status:     Code Status: Full Code  Disposition: Uncertain at this time, pending clinical improvement.  Status is: Inpatient Remains inpatient appropriate because: Persistent encephalopathy, goals of care discussion,   Family Communication: None at bedside.  Consultants:  Neurosurgery Palliative care  Procedures:  Lumbar puncture 10/02/2020. EEG 427.  Antimicrobials:  None at this time  Subjective: Today, patient was seen and examined at bedside.  Unable to verbalize.  Nursing staff reported that he has been pocketing all the medications and food in his mouth.  Palliative care consultation and goals of care discussion pending.  Objective: Vitals:   10/16/22 2102 10/17/22 0355 10/17/22 0749 10/17/22 0749  BP: (!) 153/85 (!) 157/102 (!) 165/90 (!) 165/90  Pulse: (!) 108  (!) 114 (!) 114  Resp: 16 17 18 18   Temp: 99.3 F (37.4 C) 98.5 F (36.9 C) 98 F (36.7 C) 98 F (36.7 C)  TempSrc:      SpO2: 96% 96%  100%  Weight:      Height:        Intake/Output Summary (Last 24 hours) at 10/17/2022 1339 Last data filed at 10/17/2022 0355 Gross per 24 hour  Intake --  Output 1950 ml  Net -1950 ml   Filed Weights   10/04/22 1100  Weight: 102.1 kg    Physical Examination: Body mass index is 32.28 kg/m.   General: Obese built, not in obvious distress, noninteractive, HENT:   No scleral pallor or icterus noted. Oral mucosa is moist.  Chest:  Clear breath sounds.  Diminished breath sounds bilaterally. No crackles or wheezes.  CVS: S1 &S2 heard. No murmur.  Regular rate and rhythm. Abdomen: Soft, nontender, nondistended.  Bowel sounds are heard.   Extremities: No cyanosis, clubbing or edema.  Peripheral pulses are palpable. Psych: Somnolent, involuntary movements noted, noninteractive, CNS: Somnolent Skin: Warm and dry.  No rashes noted.  Data Reviewed:   CBC: Recent Labs  Lab 10/12/22 0856 10/13/22 0406 10/15/22 0341  10/16/22 0522 10/17/22 0538  WBC 15.5* 14.4* 25.0* 19.9* 11.0*  HGB 14.4 12.8* 12.8* 12.5* 12.4*  HCT 43.1 38.3* 35.9* 35.1* 34.8*  MCV 84.0 84.2 79.8* 79.1* 79.8*  PLT 250 230 257 246 214    Basic Metabolic Panel: Recent Labs  Lab 10/13/22 0406 10/14/22 0326 10/15/22 0341 10/16/22 0522 10/17/22 0538  NA 139 136 137 140 145  K 3.4* 3.9 3.9 3.5 3.3*  CL 105 106 107 109 112*  CO2 21* 22 19* 19* 23  GLUCOSE 153* 325* 281* 177* 120*  BUN 25* 29* 53* 48* 43*  CREATININE 1.47* 1.36* 1.89* 1.54* 1.47*  CALCIUM 9.7 9.4 9.6 9.7 9.4  MG 2.5* 1.6* 2.5* 1.7 1.7    Liver Function Tests: Recent Labs  Lab 10/12/22 0856 10/13/22 0406 10/15/22 0341 10/16/22 0522 10/17/22 0538  AST 30 24 40 63* 48*  ALT 47* 39 61* 128* 130*  ALKPHOS 73 67 74 71 66  BILITOT 0.7 0.6 0.8 0.9 0.7  PROT 6.7 6.3* 6.5 6.1* 5.7*  ALBUMIN 3.2* 3.0* 3.2* 3.1* 2.9*     Radiology Studies: No results found.    LOS: 15 days    Joycelyn Das, MD [facility] Available via Epic secure chat 7am-7pm After these hours, please refer to coverage provider listed on [email or web address] 10/17/2022, 1:39 PM

## 2022-10-18 ENCOUNTER — Ambulatory Visit: Payer: Medicare Other

## 2022-10-18 ENCOUNTER — Ambulatory Visit: Payer: Non-veteran care | Admitting: Physician Assistant

## 2022-10-18 DIAGNOSIS — G9340 Encephalopathy, unspecified: Secondary | ICD-10-CM | POA: Diagnosis not present

## 2022-10-18 DIAGNOSIS — F419 Anxiety disorder, unspecified: Secondary | ICD-10-CM | POA: Diagnosis not present

## 2022-10-18 DIAGNOSIS — G030 Nonpyogenic meningitis: Secondary | ICD-10-CM | POA: Diagnosis not present

## 2022-10-18 DIAGNOSIS — Z515 Encounter for palliative care: Secondary | ICD-10-CM | POA: Diagnosis not present

## 2022-10-18 DIAGNOSIS — N179 Acute kidney failure, unspecified: Secondary | ICD-10-CM | POA: Diagnosis not present

## 2022-10-18 DIAGNOSIS — R1111 Vomiting without nausea: Secondary | ICD-10-CM

## 2022-10-18 DIAGNOSIS — Z7189 Other specified counseling: Secondary | ICD-10-CM | POA: Diagnosis not present

## 2022-10-18 LAB — GLUCOSE, CAPILLARY
Glucose-Capillary: 129 mg/dL — ABNORMAL HIGH (ref 70–99)
Glucose-Capillary: 161 mg/dL — ABNORMAL HIGH (ref 70–99)
Glucose-Capillary: 163 mg/dL — ABNORMAL HIGH (ref 70–99)
Glucose-Capillary: 170 mg/dL — ABNORMAL HIGH (ref 70–99)
Glucose-Capillary: 205 mg/dL — ABNORMAL HIGH (ref 70–99)
Glucose-Capillary: 414 mg/dL — ABNORMAL HIGH (ref 70–99)

## 2022-10-18 LAB — BASIC METABOLIC PANEL
Anion gap: 13 (ref 5–15)
BUN: 41 mg/dL — ABNORMAL HIGH (ref 8–23)
CO2: 21 mmol/L — ABNORMAL LOW (ref 22–32)
Calcium: 8.8 mg/dL — ABNORMAL LOW (ref 8.9–10.3)
Chloride: 109 mmol/L (ref 98–111)
Creatinine, Ser: 1.36 mg/dL — ABNORMAL HIGH (ref 0.61–1.24)
GFR, Estimated: 56 mL/min — ABNORMAL LOW (ref >60)
Glucose, Bld: 179 mg/dL — ABNORMAL HIGH (ref 70–99)
Potassium: 3.2 mmol/L — ABNORMAL LOW (ref 3.5–5.1)
Sodium: 143 mmol/L (ref 135–145)

## 2022-10-18 LAB — MAGNESIUM: Magnesium: 1.5 mg/dL — ABNORMAL LOW (ref 1.7–2.4)

## 2022-10-18 MED ORDER — POTASSIUM CHLORIDE 10 MEQ/100ML IV SOLN
10.0000 meq | INTRAVENOUS | Status: AC
  Administered 2022-10-18 (×4): 10 meq via INTRAVENOUS
  Filled 2022-10-18 (×3): qty 100

## 2022-10-18 MED ORDER — METHADONE HCL 10 MG PO TABS
10.0000 mg | ORAL_TABLET | Freq: Three times a day (TID) | ORAL | Status: DC
  Administered 2022-10-18 – 2022-11-02 (×41): 10 mg via ORAL
  Filled 2022-10-18 (×43): qty 1

## 2022-10-18 MED ORDER — MAGNESIUM SULFATE 2 GM/50ML IV SOLN
2.0000 g | Freq: Once | INTRAVENOUS | Status: AC
  Administered 2022-10-18: 2 g via INTRAVENOUS
  Filled 2022-10-18: qty 50

## 2022-10-18 MED ORDER — SODIUM CHLORIDE 0.9 % IV SOLN: INTRAVENOUS | Status: AC

## 2022-10-18 MED ORDER — INSULIN ASPART 100 UNIT/ML IJ SOLN
20.0000 [IU] | Freq: Once | INTRAMUSCULAR | Status: AC
  Administered 2022-10-18: 20 [IU] via SUBCUTANEOUS

## 2022-10-18 NOTE — Progress Notes (Signed)
PT Cancellation Note  Patient Details Name: Jeremy Medina. MRN: [id number] DOB: September 02, 1950   Cancelled Treatment:    Reason Eval/Treat Not Completed: Other (comment) (Awaiting palliative care meeting to continue therapy vs. sign off.)   Bevelyn Buckles 10/18/2022, 11:00 AM Jariana Shumard M,PT [facility] [phone]
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
## 2022-10-18 NOTE — Progress Notes (Addendum)
Neurology Progress Note   S:// Patient seen and examined.   O:// Current vital signs: BP (!) 157/96 (BP Location: Left Arm)   Pulse (!) 108   Temp 98.2 F (36.8 C) (Oral)   Resp 18   Ht 5\' 10"  (1.778 m)   Wt 102.1 kg   SpO2 95%   BMI 32.28 kg/m  Vital signs in last 24 hours: Temp:  [97.6 F (36.4 C)-99.3 F (37.4 C)] 98.2 F (36.8 C) (05/02 1558) Pulse Rate:  [99-109] 108 (05/02 1558) Resp:  [16-18] 18 (05/02 1558) BP: (143-160)/(83-97) 157/96 (05/02 1558) SpO2:  [93 %-98 %] 95 % (05/02 1558) General awake alert not respond to voice HEENT: Normocephalic atraumatic Lungs: Clear Cardiovascular: Regular rhythm Neurologic exam He is awake alert He tracks me but is unable to follow any commands He is completely nonverbal He has constant small myoclonic twitches of all his body. On loud clap, I question startle myoclonus. Cranial nerves II to XII tested within constraints of his mental status appear intact Motor examination has no drift but he has mild increased tone in all extremities Sensation appears intact based on grimace and withdrawal He is hyperreflexic   Medications  Current Facility-Administered Medications:    0.9 %  sodium chloride infusion, , Intravenous, Continuous, Pokhrel, Laxman, MD, Last Rate: 100 mL/hr at 10/18/22 1138, New Bag at 10/18/22 1138   acetaminophen (TYLENOL) tablet 650 mg, 650 mg, Oral, Q6H PRN, Lonia Blood, MD, 650 mg at 09/30/22 1610   acetaminophen (TYLENOL) tablet 650 mg, 650 mg, Oral, Daily PRN, Ann Held, RPH   amLODipine (NORVASC) tablet 10 mg, 10 mg, Oral, Daily, Danford, Earl Lites, MD, 10 mg at 10/18/22 0843   enoxaparin (LOVENOX) injection 50 mg, 50 mg, Subcutaneous, Q24H, Samtani, Jai-Gurmukh, MD, 50 mg at 10/17/22 1729   folic acid (FOLVITE) tablet 1 mg, 1 mg, Oral, Daily, Jetty Duhamel T, MD, 1 mg at 10/18/22 0842   insulin aspart (novoLOG) injection 0-15 Units, 0-15 Units, Subcutaneous, Q4H, Samtani,  Jai-Gurmukh, MD, 15 Units at 10/18/22 1632   insulin glargine-yfgn (SEMGLEE) injection 42 Units, 42 Units, Subcutaneous, Daily, Rhetta Mura, MD, 42 Units at 10/18/22 0843   lidocaine (PF) (XYLOCAINE) 1 % injection 5 mL, 5 mL, Other, Once, Samtani, Jai-Gurmukh, MD   lipase/protease/amylase (CREON) capsule 36,000 Units, 36,000 Units, Oral, TID Minerva Fester, MD, 36,000 Units at 10/18/22 1548   magnesium sulfate IVPB 2 g 50 mL, 2 g, Intravenous, Once, Pokhrel, Laxman, MD   methadone (DOLOPHINE) tablet 10 mg, 10 mg, Oral, TID, Pokhrel, Laxman, MD, 10 mg at 10/18/22 1548   ondansetron (ZOFRAN) tablet 4 mg, 4 mg, Oral, Q6H PRN **OR** ondansetron (ZOFRAN) injection 4 mg, 4 mg, Intravenous, Q6H PRN, Darreld Mclean R, MD, 4 mg at 10/11/22 1419   senna-docusate (Senokot-S) tablet 1 tablet, 1 tablet, Oral, QHS, Samtani, Jai-Gurmukh, MD, 1 tablet at 10/17/22 2233   sodium chloride flush (NS) 0.9 % injection 3 mL, 3 mL, Intravenous, Q12H, Darreld Mclean R, MD, 3 mL at 10/18/22 0849 Labs CBC    Component Value Date/Time   WBC 11.4 (H) 10/17/2022 1419   RBC 4.27 10/17/2022 1419   HGB 12.1 (L) 10/17/2022 1419   HCT 34.5 (L) 10/17/2022 1419   PLT 189 10/17/2022 1419   MCV 80.8 10/17/2022 1419   MCV 83.0 11/30/2014 1835   MCH 28.3 10/17/2022 1419   MCHC 35.1 10/17/2022 1419   RDW 16.0 (H) 10/17/2022 1419   LYMPHSABS 1.8 09/28/2022 1555  MONOABS 1.4 (H) 09/28/2022 1555   EOSABS 0.0 09/28/2022 1555   BASOSABS 0.0 09/28/2022 1555    CMP     Component Value Date/Time   NA 143 10/18/2022 0738   K 3.2 (L) 10/18/2022 0738   CL 109 10/18/2022 0738   CO2 21 (L) 10/18/2022 0738   GLUCOSE 179 (H) 10/18/2022 0738   BUN 41 (H) 10/18/2022 0738   CREATININE 1.36 (H) 10/18/2022 0738   CREATININE 1.13 11/30/2014 1835   CALCIUM 8.8 (L) 10/18/2022 0738   PROT 5.7 (L) 10/17/2022 0538   ALBUMIN 2.9 (L) 10/17/2022 0538   AST 48 (H) 10/17/2022 0538   ALT 130 (H) 10/17/2022 0538   ALKPHOS 66  10/17/2022 0538   BILITOT 0.7 10/17/2022 0538   GFRNONAA 56 (L) 10/18/2022 0738   GFRAA >60 10/11/2015 1225   CSF 10/03/2022 with glucose 67, RBC 18, WBC 60, 87% monocyte macrophage complex, protein 365. CSF on 10/11/2022 with glucose 43, 1 RBC, 29 WBC, 61% monocyte macrophage, 38% lymphocyte, xanthochromic, protein 272.  Imaging I have reviewed images in epic and the results pertinent to this consultation are: MR brain 09/14/2022-no acute changes.   Assessment: Patient with rapidly progressing dementia, over the past few months, with initial concern for hydrocephalus as well as possible fungal infection, has gone through extensive infectious workup with autoimmune encephalitis panel and paraneoplastic panel and CSF pending. Spinal tap showed increased pressures both times.  Increased protein and white cells with concerns for infectious etiology which has been extensively worked up and no clear identifiable cause. Based on my exam and the clinical course of rapid progressive dementia, I suspect that he might have underlying prion disease. I would recommend workup for CJD. The lab has CSF from 10/11/2022  Recommendations: I have checked with the laboratory-to have CSF from 10/10/2022. I have put in for RT-QUIC protein 14 3 3  testing for CJD I attempted to reach the on-call pathologist at the number provided on AMION for notification and left a voicemail.  I have not heard back yet. Repeat EEG to look for PSWC Repeat MRI, with and without I had a detailed discussion with Dr. Tyson Babinski, his primary hospitalist as well as son [name] over the phone to discuss my treatment plan and they are in agreement.    -- Milon Dikes, MD Neurologist [facility] Pager: [phone]

## 2022-10-18 NOTE — Progress Notes (Addendum)
PROGRESS NOTE    Jeremy Medina.  ZOX:[id number] DOB: [birth year] DOA: 09/28/2022 PCP: [facility]    Brief Narrative:   [name]. is a 72 y.o. 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  He was discharged home with neurology and neurosurgery follow-up.  This time patient presented with AKI and had to see neurosurgery clinic and was sent to the ER for LP.    Sequence of events as below.      4/12: Admitted 4/14: Cr normalized, persistent encephalopathy noted 4/15: Neurology consulted 4/17: LP obtained 4/18: ID consulted, amphotericin started 4/19: Patient more alert 4/20-4/24--intermittent waxing waning mentation 4/25 immediately post LP more lucid-conversant short sentences---Fungal meningitis Etiology now questioned--LP shows 29 WBC on champagne tap---Broadened Abx coverage 4/26.  Broad coverage was discontinued by ID-still awaiting labs-long-term EEG planned for tonight  4/27 EEG grossly negative, amphotericin B stopped, IV fluids reinitiated 2/2 AKI, IV steroids Solu-Medrol started, working up autoimmune encephalitis and getting ANA and other autoimmune panel 4/28 CT chest abdomen pelvis negative for any gross pathology other than potentially some mild proctitis which may be an overcall 4/30-neurosurgery consulted-not a candidate for shunt placement based on discussions with them, palliative care consulted for goals of care    Assessment and Plan:  Suspected fungal meningitis/Progressive subacute encephalopathy Progressive encephalopathy since January.  Patient was admitted from neurosurgery office for concerns of mental status changes. CT head and MRV showed no acute process.   No improvement with IV thiamine.  Initial LP obtained that showed elevated protein and WBC.  No substantial  improvement with initial LP, Neurology suspected fungal meningitis.  LP performed again 4/25--34 cm water opening pressure patient evaluated subsequent to procedure and mentation improved he was verbalizing more felt by neurology to be secondary to relief of CSF pressure.  Patient was initially on Amphotericin which was discontinued.  Continue long-term EEG. ESR 45 CRP less than 0.5 rheumatoid factor, ANA are negative. From 4/25 LP: Encephalitis panel negative, CSF Gram stain NGTD fungal CSF negative.  Pending oligoclonal bands.  CT scan of the chest abdomen pelvis negative.  Neurosurgery indicated that the patient is a poor candidate for shunt placement.   Cytology of CSF -- no malignant cells Crypto antigen -- negative Meningitis PCR panel -- negative Fungitell -- pending Bastomyces Antigen serum -- pending Coccidiodes Abs -- pending Quant gold -- negative MT-RIF NAA with culture TB testing from CSF -- pending Karius test -- pending Blasto IgG CSF -- pending Histo IgG CSF -- pending Blasto antigen CSF -- pending VDRL -- negative  Vomiting None at this time.  Recent CT scan with proctitis.  Hypokalemia; replenish through IV.  Check levels in AM.  Hypomagnesemia.  Magnesium of 1.5 today.  Will replenish through IV.   Acute kidney injury Recent creatinine of 1.3.  Was on IV fluids.   Chronic pain syndrome Methadone was cut down to 5 mg 3 times daily on 10/15/22.   Transaminitis Likely NASH given normal [place].  Check CMP in AM.   Diabetes mellitus type 2, controlled, without complications On 70/30 insulin at home.  Currently on Lantus and sliding scale insulin.  Latest POC glucose of 124.  Benign essential hypertension Continue amlodipine.  Lisinopril on hold.  Obesity (BMI 30-39.9) BMI 32.  Might benefit from weight loss as outpatient.   Folate  deficiency   Continue folate supplement.   Anxiety Was on Ativan which was discontinued 4/28.   Goals of care discussion.   Palliative care on board.  Family meeting planned for further goals of care.    DVT prophylaxis: Place and maintain sequential compression device Start: 10/02/22 1610   Code Status:     Code Status: Full Code  Disposition: Uncertain at this time, pending clinical improvement.  Status is: Inpatient  Remains inpatient appropriate because: Persistent encephalopathy, goals of care discussion,   Family Communication:  Spoke with the patient's son on the phone and updated him about the clinical condition of the patient.  Consultants:  Neurosurgery Palliative care  Procedures:  Lumbar puncture 10/02/2020. EEG 4/27.  Antimicrobials:  None at this time  Subjective:  Today, patient was seen and examined at bedside.  Not much verbal.  Saying  "Ah"when on tactile stimulus.    Objective: Vitals:   10/17/22 1650 10/17/22 1938 10/18/22 0436 10/18/22 0800  BP: (!) 157/89 (!) 160/97 (!) 143/83 (!) 143/89  Pulse: (!) 102 (!) 101 99 (!) 101  Resp: 18 16 16 18   Temp: 97.8 F (36.6 C) 99.3 F (37.4 C) 98 F (36.7 C) 98.4 F (36.9 C)  TempSrc:  Oral    SpO2: 95% 93% 95% 96%  Weight:      Height:        Intake/Output Summary (Last 24 hours) at 10/18/2022 0942 Last data filed at 10/18/2022 0824 Gross per 24 hour  Intake 50 ml  Output 2050 ml  Net -2000 ml    Filed Weights   10/04/22 1100  Weight: 102.1 kg    Physical Examination: Body mass index is 32.28 kg/m.   General: Obese built, not in obvious distress, noninteractive, HENT:   No scleral pallor or icterus noted. Oral mucosa is moist.  Chest:  Clear breath sounds.   No crackles or wheezes.  CVS: S1 &S2 heard. No murmur.  Regular rate and rhythm. Abdomen: Soft, nontender, nondistended.  Bowel sounds are heard.   Extremities: No cyanosis, clubbing or edema.  Peripheral pulses are palpable. Psych: Somnolent, involuntary movements noted, noninteractive, CNS: Somnolent Skin: Warm and dry.  No rashes noted.  Data  Reviewed:   CBC: Recent Labs  Lab 10/13/22 0406 10/15/22 0341 10/16/22 0522 10/17/22 0538 10/17/22 1419  WBC 14.4* 25.0* 19.9* 11.0* 11.4*  HGB 12.8* 12.8* 12.5* 12.4* 12.1*  HCT 38.3* 35.9* 35.1* 34.8* 34.5*  MCV 84.2 79.8* 79.1* 79.8* 80.8  PLT 230 257 246 214 189     Basic Metabolic Panel: Recent Labs  Lab 10/14/22 0326 10/15/22 0341 10/16/22 0522 10/17/22 0538 10/18/22 0738  NA 136 137 140 145 143  K 3.9 3.9 3.5 3.3* 3.2*  CL 106 107 109 112* 109  CO2 22 19* 19* 23 21*  GLUCOSE 325* 281* 177* 120* 179*  BUN 29* 53* 48* 43* 41*  CREATININE 1.36* 1.89* 1.54* 1.47* 1.36*  CALCIUM 9.4 9.6 9.7 9.4 8.8*  MG 1.6* 2.5* 1.7 1.7 1.5*     Liver Function Tests: Recent Labs  Lab 10/12/22 0856 10/13/22 0406 10/15/22 0341 10/16/22 0522 10/17/22 0538  AST 30 24 40 63* 48*  ALT 47* 39 61* 128* 130*  ALKPHOS 73 67 74 71 66  BILITOT 0.7 0.6 0.8 0.9 0.7  PROT 6.7 6.3* 6.5 6.1* 5.7*  ALBUMIN 3.2* 3.0* 3.2* 3.1* 2.9*      Radiology Studies: No results found.    LOS: 16 days    Jimena Wieczorek  Stelios Kirby, MD [facility] Available via Epic secure chat 7am-7pm After these hours, please refer to coverage provider listed on [email or web address] 10/18/2022, 9:42 AM

## 2022-10-18 NOTE — Progress Notes (Signed)
ID update on send out labs:   Lumbar puncture 10/04/2022: Opening pressure 36, closing pressure 10.  Only able to get 15 mL CSF cytology--no malignant cells CSF cultures--negative Crypto antigen--negative Meningitis encephalitis panel--negative Blastomyces serum--negative CSF VDRL--negative MTB/RIF--negative Coccidioides--negative Histo urine Ag--negative   Lumbar puncture 10/11/22: Opening pressure 34, closing pressure 9.  Removal of 17.5 mL of clear CSF CSF cytology--atypical cells present in background of abundant lymphocytes CSF pathology--pending Meningitis encephalitis panel--negative CSF VDRL--non-reactive CSF paraneoplastic panel--pending CSF autoimmune encephalitis panel--pending CSF & Serum oligoclonal bands- Zero oligoclonal bands were observed in the CSF. However two paired bands were observed in both the CSF and serum. Paired bands suggest an immune response to an inflammatory process outside the CNS and are unlikely to represent a CNS demyelinating disease.  CSF cultures--negative   Serologic workup: QuantiFERON--negative Fungitell--negative Karius test--Negative Paraneoplastic panel--pending Autoimmune encephalitis panel--pending   No new ID recommendations given negative extensive ID work up at this time.  Will sign off, please call as needed.    Vedia Coffer for Infectious Disease [facility] 10/18/2022, 9:42 AM

## 2022-10-19 ENCOUNTER — Inpatient Hospital Stay (HOSPITAL_COMMUNITY): Payer: No Typology Code available for payment source

## 2022-10-19 DIAGNOSIS — Z515 Encounter for palliative care: Secondary | ICD-10-CM | POA: Diagnosis not present

## 2022-10-19 DIAGNOSIS — Z7189 Other specified counseling: Secondary | ICD-10-CM | POA: Diagnosis not present

## 2022-10-19 DIAGNOSIS — R4182 Altered mental status, unspecified: Secondary | ICD-10-CM | POA: Diagnosis not present

## 2022-10-19 DIAGNOSIS — N179 Acute kidney failure, unspecified: Secondary | ICD-10-CM | POA: Diagnosis not present

## 2022-10-19 DIAGNOSIS — G030 Nonpyogenic meningitis: Secondary | ICD-10-CM | POA: Diagnosis not present

## 2022-10-19 DIAGNOSIS — G894 Chronic pain syndrome: Secondary | ICD-10-CM | POA: Diagnosis not present

## 2022-10-19 DIAGNOSIS — F419 Anxiety disorder, unspecified: Secondary | ICD-10-CM | POA: Diagnosis not present

## 2022-10-19 DIAGNOSIS — G9340 Encephalopathy, unspecified: Secondary | ICD-10-CM | POA: Diagnosis not present

## 2022-10-19 DIAGNOSIS — R569 Unspecified convulsions: Secondary | ICD-10-CM | POA: Diagnosis not present

## 2022-10-19 LAB — GLUCOSE, CAPILLARY
Glucose-Capillary: 112 mg/dL — ABNORMAL HIGH (ref 70–99)
Glucose-Capillary: 123 mg/dL — ABNORMAL HIGH (ref 70–99)
Glucose-Capillary: 149 mg/dL — ABNORMAL HIGH (ref 70–99)
Glucose-Capillary: 182 mg/dL — ABNORMAL HIGH (ref 70–99)
Glucose-Capillary: 294 mg/dL — ABNORMAL HIGH (ref 70–99)
Glucose-Capillary: 299 mg/dL — ABNORMAL HIGH (ref 70–99)
Glucose-Capillary: 47 mg/dL — ABNORMAL LOW (ref 70–99)

## 2022-10-19 LAB — MISC LABCORP TEST (SEND OUT)
Labcorp test code: 9985
Source (LabCorp): 2

## 2022-10-19 LAB — MAGNESIUM: Magnesium: 1.8 mg/dL (ref 1.7–2.4)

## 2022-10-19 MED ORDER — INSULIN ASPART 100 UNIT/ML IJ SOLN
0.0000 [IU] | Freq: Three times a day (TID) | INTRAMUSCULAR | Status: DC
  Administered 2022-10-19: 3 [IU] via SUBCUTANEOUS
  Administered 2022-10-19: 2 [IU] via SUBCUTANEOUS
  Administered 2022-10-20: 3 [IU] via SUBCUTANEOUS
  Administered 2022-10-20: 8 [IU] via SUBCUTANEOUS
  Administered 2022-10-21: 5 [IU] via SUBCUTANEOUS
  Administered 2022-10-21: 2 [IU] via SUBCUTANEOUS
  Administered 2022-10-22: 5 [IU] via SUBCUTANEOUS
  Administered 2022-10-22: 3 [IU] via SUBCUTANEOUS
  Administered 2022-10-22: 8 [IU] via SUBCUTANEOUS
  Administered 2022-10-23: 3 [IU] via SUBCUTANEOUS
  Administered 2022-10-23: 2 [IU] via SUBCUTANEOUS
  Administered 2022-10-24 – 2022-10-26 (×5): 5 [IU] via SUBCUTANEOUS
  Administered 2022-10-26 – 2022-10-27 (×2): 2 [IU] via SUBCUTANEOUS
  Administered 2022-10-27 – 2022-10-28 (×3): 5 [IU] via SUBCUTANEOUS

## 2022-10-19 MED ORDER — INSULIN GLARGINE-YFGN 100 UNIT/ML ~~LOC~~ SOLN
30.0000 [IU] | Freq: Every day | SUBCUTANEOUS | Status: DC
  Administered 2022-10-19 – 2022-10-25 (×7): 30 [IU] via SUBCUTANEOUS
  Filled 2022-10-19 (×8): qty 0.3

## 2022-10-19 MED ORDER — GADOBUTROL 1 MMOL/ML IV SOLN
10.0000 mL | Freq: Once | INTRAVENOUS | Status: AC | PRN
  Administered 2022-10-19: 10 mL via INTRAVENOUS

## 2022-10-19 MED ORDER — GLUCERNA SHAKE PO LIQD
237.0000 mL | Freq: Three times a day (TID) | ORAL | Status: DC
  Administered 2022-10-19 – 2022-11-02 (×31): 237 mL via ORAL

## 2022-10-19 MED ORDER — DEXTROSE 50 % IV SOLN
25.0000 g | INTRAVENOUS | Status: AC
  Administered 2022-10-19: 25 g via INTRAVENOUS
  Filled 2022-10-19: qty 50

## 2022-10-19 NOTE — Progress Notes (Signed)
PROGRESS NOTE    Jeremy Medina.  ZOX:[id number] DOB: May 20, 1951 DOA: 09/28/2022 PCP: [facility]    Brief Narrative:   Jeremy Medina. is a 72 y.o. 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  He was discharged home with neurology and neurosurgery follow-up.  This time patient presented with AKI and had to see neurosurgery clinic and was sent to the ER for LP.    Sequence of events as below.      4/12: Admitted 4/14: Cr normalized, persistent encephalopathy noted 4/15: Neurology consulted 4/17: LP obtained 4/18: ID consulted, amphotericin started 4/19: Patient more alert 4/20-4/24--intermittent waxing waning mentation 4/25 immediately post LP more lucid-conversant short sentences---Fungal meningitis Etiology now questioned--LP shows 29 WBC on champagne tap---Broadened Abx coverage 4/26.  Broad coverage was discontinued by ID-still awaiting labs-long-term EEG planned for tonight  4/27 EEG grossly negative, amphotericin B stopped, IV fluids reinitiated 2/2 AKI, IV steroids Solu-Medrol started, working up autoimmune encephalitis and getting ANA and other autoimmune panel 4/28 CT chest abdomen pelvis negative for any gross pathology other than potentially some mild proctitis which may be an overcall 4/30-neurosurgery consulted-not a candidate for shunt placement based on discussions with them, palliative care consulted for goals of care    Assessment and Plan:  Progressive subacute encephalopathy Progressive encephalopathy since January.  Patient was admitted from neurosurgery office for concerns of mental status changes. CT head and MRV showed no acute process.   No improvement with IV thiamine.  Initial LP obtained that showed elevated protein and WBC.  No substantial improvement with initial LP,  Neurology suspected fungal meningitis.  LP performed again 4/25--34 cm water opening pressure patient evaluated subsequent to procedure and mentation improved he was verbalizing more felt by neurology to be secondary to relief of CSF pressure.  Patient was initially on Amphotericin which was discontinued.  Continue long-term EEG. ESR 45 CRP less than 0.5 rheumatoid factor, ANA are negative. From 4/25 LP: Encephalitis panel negative, CSF Gram stain NGTD fungal CSF negative.  Pending oligoclonal bands.  CT scan of the chest abdomen pelvis negative.  Neurosurgery indicated that the patient is a poor candidate for shunt placement.  Neurology has reassessed the patient on 5 the 07/2022 with concern for CJD.  CSF fluid analysis has been sent.  Vomiting None at this time.  Recent CT scan with proctitis.  Hypokalemia; received IV potassium.  Check levels in AM.  Hypomagnesemia.  Magnesium of 1.9 today.  Replenished with IV magnesium yesterday.   Acute kidney injury Test creatinine of 1.3.  Was on IV fluids.   Chronic pain syndrome Methadone was cut down to 5 mg 3 times daily on 10/15/22.  Has been changed back to 10 mg 3 times daily at the request of the family due to concerns for underlying pain.   Transaminitis Likely NASH given normal [place].  Check CMP in AM.   Diabetes mellitus type 2, controlled, without complications On 70/30 insulin at home.  Currently on Lantus and sliding scale insulin.  Latest POC glucose of 112.  Benign essential hypertension Continue amlodipine.  Lisinopril on hold.  Blood pressure seems to be a.  Add as needed hydralazine.  Obesity (BMI 30-39.9) BMI 32.  Might benefit from weight loss as outpatient.   Folate deficiency   Continue folate supplement.   Anxiety Was on  Ativan which was discontinued 4/28.   Goals of care discussion.  Palliative care on board.  Family wanting to pursue on further  and treatment plan.   DVT prophylaxis: Place and maintain sequential  compression device Start: 10/02/22 1610   Code Status:     Code Status: Full Code  Disposition: Uncertain at this time, pending clinical improvement.  Status is: Inpatient  Remains inpatient appropriate because: Persistent encephalopathy, pending clinical improvement.   Family Communication:  Spoke with the patient's son on the phone and updated him about the clinical condition of the patient on 10/18/2022.  Consultants:  Neurosurgery Palliative care Neurology  Procedures:  Lumbar puncture 10/02/2020. EEG 4/27.  Antimicrobials:  None at this time  Subjective:  Today, patient was seen and examined at bedside.  Nonverbal.  Opens eyes spontaneously.    Objective: Vitals:   10/18/22 1558 10/18/22 1940 10/19/22 0627 10/19/22 0822  BP: (!) 157/96 (!) 141/80 (!) 145/93 (!) 162/93  Pulse: (!) 108 (!) 107 99 97  Resp: 18 17 16 18   Temp: 98.2 F (36.8 C) 98.9 F (37.2 C) 98.2 F (36.8 C) 98.2 F (36.8 C)  TempSrc: Oral  Oral Oral  SpO2: 95% 96% 94% 97%  Weight:      Height:        Intake/Output Summary (Last 24 hours) at 10/19/2022 1005 Last data filed at 10/19/2022 9604 Gross per 24 hour  Intake 893.72 ml  Output 2650 ml  Net -1756.28 ml    Filed Weights   10/04/22 1100  Weight: 102.1 kg    Physical Examination: Body mass index is 32.28 kg/m.   General: Opens eyes spontaneously, nonverbal, obese built, not in obvious distress, noninteractive, HENT:   No scleral pallor or icterus noted. Oral mucosa is moist.  Chest:  Clear breath sounds.   No crackles or wheezes.  CVS: S1 &S2 heard. No murmur.  Regular rate and rhythm. Abdomen: Soft, nontender, nondistended.  Bowel sounds are heard.   Extremities: No cyanosis, clubbing or edema.  Peripheral pulses are palpable. Psych: Involuntary jerking, noninteractive, nonverbal,  CNS: Nonverbal, noninteractive. Skin: Warm and dry.  No rashes noted.  Data Reviewed:   CBC: Recent Labs  Lab 10/13/22 0406 10/15/22 0341  10/16/22 0522 10/17/22 0538 10/17/22 1419  WBC 14.4* 25.0* 19.9* 11.0* 11.4*  HGB 12.8* 12.8* 12.5* 12.4* 12.1*  HCT 38.3* 35.9* 35.1* 34.8* 34.5*  MCV 84.2 79.8* 79.1* 79.8* 80.8  PLT 230 257 246 214 189     Basic Metabolic Panel: Recent Labs  Lab 10/14/22 0326 10/15/22 0341 10/16/22 0522 10/17/22 0538 10/18/22 0738 10/19/22 0757  NA 136 137 140 145 143  --   K 3.9 3.9 3.5 3.3* 3.2*  --   CL 106 107 109 112* 109  --   CO2 22 19* 19* 23 21*  --   GLUCOSE 325* 281* 177* 120* 179*  --   BUN 29* 53* 48* 43* 41*  --   CREATININE 1.36* 1.89* 1.54* 1.47* 1.36*  --   CALCIUM 9.4 9.6 9.7 9.4 8.8*  --   MG 1.6* 2.5* 1.7 1.7 1.5* 1.8     Liver Function Tests: Recent Labs  Lab 10/13/22 0406 10/15/22 0341 10/16/22 0522 10/17/22 0538  AST 24 40 63* 48*  ALT 39 61* 128* 130*  ALKPHOS 67 74 71 66  BILITOT 0.6 0.8 0.9 0.7  PROT 6.3* 6.5 6.1* 5.7*  ALBUMIN 3.0* 3.2* 3.1* 2.9*      Radiology Studies: No results found.  LOS: 17 days    Joycelyn Das, MD [facility] Available via Epic secure chat 7am-7pm After these hours, please refer to coverage provider listed on [email or web address] 10/19/2022, 10:05 AM

## 2022-10-19 NOTE — Plan of Care (Signed)
Received call from the lab regarding correct orders to be placed in the chart.  Placed the orders as requested.  Already completed paperwork that needs to be sent to the CJD lab. Available with questions as needed.    -- Milon Dikes, MD Neurologist [facility] Pager: [phone]

## 2022-10-19 NOTE — Progress Notes (Signed)
This chaplain responded to consult for spiritual care in the setting of major life transition. The chaplain is coordinating spiritual care with PMT NP-Shae.  The Pt. is out of the room at the time of the visit. The chaplain understands from the RN-Olivia update, the Pt. son often visits in the afternoon. The chaplain will attempt a revisit when both son and Pt. are present.  Chaplain Stephanie Acre [phone]

## 2022-10-19 NOTE — Plan of Care (Signed)
Pathologist on call Dr. Corey Harold notified of CJD testing by phone.  -- Milon Dikes, MD
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
## 2022-10-19 NOTE — Progress Notes (Signed)
EEG complete - results pending 
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
## 2022-10-19 NOTE — Procedures (Signed)
Patient Name: Jeremy Medina.  MRN: [id number]  Epilepsy Attending: Charlsie Quest  Referring Physician/Provider: Milon Dikes, MD  Date: 10/19/2022 Duration: 36.23 mins  Patient history: 72yo M with rapidly progressing dementia, 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 EEG to evaluate for seizure  Level of alertness:  lethargic   AEDs during EEG study: None  Technical aspects: This EEG study was done with scalp electrodes positioned according to the 10-20 International system of electrode placement. Electrical activity was reviewed with band pass filter of 1-70Hz , sensitivity of 7 uV/mm, display speed of 68mm/sec with a 60Hz  notched filter applied as appropriate. EEG data were recorded continuously and digitally stored.  Video monitoring was available and reviewed as appropriate.  Description: No clear posterior dominant rhythm was seen. EEG showed continuous generalized 3 to 6 Hz theta-delta slowing. Generalized periodic discharges with triphasic morphology at 1 Hz were noted, more prominent when awake/stimulated.  Hyperventilation and photic stimulation were not performed.      ABNORMALITY - Periodic discharges with triphasic morphology, generalized - Continuous slow, generalized   IMPRESSION: This study is suggestive of moderate diffuse encephalopathy, nonspecific etiology. Generalized periodic discharges with triphasic morphology can be seen due to CJD but is not specific. No seizures or definite epileptiform discharges were seen throughout the recording.   Clelia Trabucco Annabelle Harman

## 2022-10-20 DIAGNOSIS — Z515 Encounter for palliative care: Secondary | ICD-10-CM

## 2022-10-20 DIAGNOSIS — G030 Nonpyogenic meningitis: Secondary | ICD-10-CM | POA: Diagnosis not present

## 2022-10-20 DIAGNOSIS — Z7189 Other specified counseling: Secondary | ICD-10-CM | POA: Diagnosis not present

## 2022-10-20 DIAGNOSIS — G894 Chronic pain syndrome: Secondary | ICD-10-CM | POA: Diagnosis not present

## 2022-10-20 LAB — GLUCOSE, CAPILLARY
Glucose-Capillary: 310 mg/dL — ABNORMAL HIGH (ref 70–99)
Glucose-Capillary: 253 mg/dL — ABNORMAL HIGH (ref 70–99)
Glucose-Capillary: 198 mg/dL — ABNORMAL HIGH (ref 70–99)
Glucose-Capillary: 112 mg/dL — ABNORMAL HIGH (ref 70–99)
Glucose-Capillary: 71 mg/dL (ref 70–99)

## 2022-10-20 LAB — BASIC METABOLIC PANEL
Anion gap: 11 (ref 5–15)
Anion gap: 11 (ref 5–15)
BUN: 40 mg/dL — ABNORMAL HIGH (ref 8–23)
BUN: 36 mg/dL — ABNORMAL HIGH (ref 8–23)
CO2: 20 mmol/L — ABNORMAL LOW (ref 22–32)
CO2: 23 mmol/L (ref 22–32)
Calcium: 7.9 mg/dL — ABNORMAL LOW (ref 8.9–10.3)
Calcium: 8.1 mg/dL — ABNORMAL LOW (ref 8.9–10.3)
Chloride: 109 mmol/L (ref 98–111)
Chloride: 110 mmol/L (ref 98–111)
Creatinine, Ser: 1.42 mg/dL — ABNORMAL HIGH (ref 0.61–1.24)
Creatinine, Ser: 1.35 mg/dL — ABNORMAL HIGH (ref 0.61–1.24)
GFR, Estimated: 53 mL/min — ABNORMAL LOW (ref >60)
GFR, Estimated: 56 mL/min — ABNORMAL LOW (ref >60)
Glucose, Bld: 345 mg/dL — ABNORMAL HIGH (ref 70–99)
Glucose, Bld: 214 mg/dL — ABNORMAL HIGH (ref 70–99)
Potassium: 2.5 mmol/L — CL (ref 3.5–5.1)
Potassium: 2.6 mmol/L — CL (ref 3.5–5.1)
Sodium: 140 mmol/L (ref 135–145)
Sodium: 144 mmol/L (ref 135–145)

## 2022-10-20 LAB — CBC
HCT: 33.4 % — ABNORMAL LOW (ref 39.0–52.0)
Hemoglobin: 11.5 g/dL — ABNORMAL LOW (ref 13.0–17.0)
MCH: 28.6 pg (ref 26.0–34.0)
MCHC: 34.4 g/dL (ref 30.0–36.0)
MCV: 83.1 fL (ref 80.0–100.0)
Platelets: 133 10*3/uL — ABNORMAL LOW (ref 150–400)
RBC: 4.02 MIL/uL — ABNORMAL LOW (ref 4.22–5.81)
RDW: 16.1 % — ABNORMAL HIGH (ref 11.5–15.5)
WBC: 10.4 10*3/uL (ref 4.0–10.5)
nRBC: 0 % (ref 0.0–0.2)

## 2022-10-20 LAB — MAGNESIUM: Magnesium: 1.8 mg/dL (ref 1.7–2.4)

## 2022-10-20 MED ORDER — MAGNESIUM SULFATE IN D5W 1-5 GM/100ML-% IV SOLN
1.0000 g | Freq: Once | INTRAVENOUS | Status: AC
  Administered 2022-10-20: 1 g via INTRAVENOUS
  Filled 2022-10-20: qty 100

## 2022-10-20 MED ORDER — POTASSIUM CHLORIDE 10 MEQ/100ML IV SOLN
10.0000 meq | INTRAVENOUS | Status: AC
  Administered 2022-10-20 (×2): 10 meq via INTRAVENOUS
  Filled 2022-10-20 (×2): qty 100

## 2022-10-20 MED ORDER — POTASSIUM CHLORIDE 20 MEQ PO PACK
40.0000 meq | PACK | Freq: Two times a day (BID) | ORAL | Status: DC
  Administered 2022-10-20 – 2022-10-25 (×11): 40 meq via ORAL
  Filled 2022-10-20 (×11): qty 2

## 2022-10-20 MED ORDER — POTASSIUM CHLORIDE 10 MEQ/100ML IV SOLN
10.0000 meq | INTRAVENOUS | Status: AC
  Administered 2022-10-20 (×4): 10 meq via INTRAVENOUS
  Filled 2022-10-20 (×3): qty 100

## 2022-10-20 NOTE — Progress Notes (Signed)
Critical lab value this am K+ 2.5. MD notified. New orders in place for PO BID. Patient in bed with call bell within reach.

## 2022-10-20 NOTE — Progress Notes (Addendum)
PROGRESS NOTE    Jeremy Medina.  BJY:[id number] DOB: 1950-06-26 DOA: 09/28/2022 [facility]CP: [facility]    Brief Narrative:   [name]. is a 72 y.o. 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  He was discharged home with neurology and neurosurgery follow-up.  This time patient presented with AKI and had to see neurosurgery clinic and was sent to the ER for LP.    Sequence of events as below.      4/12: Admitted 4/14: Cr normalized, persistent encephalopathy noted 4/15: Neurology consulted 4/17: LP obtained 4/18: ID consulted, amphotericin started 4/19: Patient more alert 4/20-4/24--intermittent waxing waning mentation 4/25 immediately post LP more lucid-conversant short sentences---Fungal meningitis Etiology now questioned--LP shows 29 WBC on champagne tap---Broadened Abx coverage 4/26.  Broad coverage was discontinued by ID-still awaiting labs-long-term EEG planned for tonight  4/27 EEG grossly negative, amphotericin B stopped, IV fluids reinitiated 2/2 AKI, IV steroids Solu-Medrol started, working up autoimmune encephalitis and getting ANA and other autoimmune panel 4/28 CT chest abdomen pelvis negative for any gross pathology other than potentially some mild proctitis which may be an overcall 4/30-neurosurgery consulted-not a candidate for shunt placement based on discussions with them, palliative care consulted for goals of care    Assessment and Plan:  Progressive subacute encephalopathy Progressive encephalopathy since January.  Patient was admitted from neurosurgery office for concerns of mental status changes. CT head and MRV showed no acute process.   No improvement with IV thiamine.  Initial LP obtained that showed elevated protein and WBC.  No substantial improvement with initial LP,  Neurology suspected fungal meningitis.  LP performed again 4/25--34 cm water opening pressure patient evaluated subsequent to procedure and mentation improved he was verbalizing more felt by neurology to be secondary to relief of CSF pressure.  Patient was initially on Amphotericin which was discontinued. . ESR 45 CRP less than 0.5 rheumatoid factor, ANA are negative. 4/25 LP: Encephalitis panel negative, CSF Gram stain NGTD fungal CSF negative.   CT scan of the chest abdomen pelvis negative.  Neurosurgery indicated that the patient is a poor candidate for shunt placement.  Neurology has reassessed the patient on 10/18/2022 with concern for CJD.  CSF fluid analysis has been sent.  Vomiting None at this time.  Recent CT scan with proctitis.  Hypokalemia; potassium 2.5 today.  Will give 60 mEq of IV potassium today.  Check levels in AM.  Hypomagnesemia.  Magnesium of 1. 8 today.  Will replace with 1 g of magnesium.   Acute kidney injury Latest creatinine of 1.3.  Was on IV fluids.   Chronic pain syndrome Methadone was cut down to 5 mg 3 times daily on 10/15/22.  Has been changed back to 10 mg 3 times daily at the request of the family due to concerns for underlying pain.   Transaminitis Likely NASH given normal [place].  Check CMP in AM.   Diabetes mellitus type 2, controlled, without complications On 70/30 insulin at home.  Currently on Lantus and sliding scale insulin.  Latest POC glucose of 112.  Benign essential hypertension As needed hydralazine.  Lisinopril on hold.  On amlodipine.  Obesity (BMI 30-39.9) BMI 32.  Might benefit from weight loss as outpatient.   Folate deficiency   Continue folate supplement.   Anxiety Was on Ativan which was discontinued 4/28.  Goals of care discussion.  Palliative care on board.  Family wanting to pursue on further  and treatment plan.   DVT prophylaxis: Place and maintain sequential compression device Start: 10/02/22 1610   Code Status:     Code  Status: Full Code  Disposition: Uncertain at this time, pending clinical improvement.  Status is: Inpatient  Remains inpatient appropriate because: Persistent encephalopathy, pending clinical improvement.   Family Communication:  Spoke with the patient's son on the phone and updated him about the clinical condition of the patient on 10/18/2022.  Consultants:  Neurosurgery Palliative care Neurology  Procedures:  Lumbar puncture 10/02/2020. EEG 4/27.  Antimicrobials:  None at this time  Subjective: Today, patient was seen and examined at bedside.  No interval changes noted.  Still nonverbal with spontaneous eye movement.  Potassium noted to be low at 2.5.  Tracking some and moans.  Objective: Vitals:   2022-10-24 1956 Oct 24, 2022 2021 10/20/22 0458 10/20/22 0737  BP: 121/82 126/83 132/79 (!) 141/98  Pulse: (!) 108 96 92 (!) 102  Resp: 16 20 16  (!) 24  Temp: 99.7 F (37.6 C)  97.6 F (36.4 C) 98.5 F (36.9 C)  TempSrc:    Oral  SpO2: 95% 96% 94% 94%  Weight:      Height:        Intake/Output Summary (Last 24 hours) at 10/20/2022 1336 Last data filed at 10/20/2022 1240 Gross per 24 hour  Intake 120 ml  Output 900 ml  Net -780 ml    Filed Weights   10/04/22 1100  Weight: 102.1 kg    Physical Examination: Body mass index is 32.28 kg/m.   General: Eyes opening spontaneously, nonverbal, not in obvious distress. HENT:   No scleral pallor or icterus noted. Oral mucosa is moist.  Chest:  Clear breath sounds.   No crackles or wheezes.  CVS: S1 &S2 heard. No murmur.  Regular rate and rhythm. Abdomen: Soft, nontender, nondistended.  Bowel sounds are heard.   Extremities: No cyanosis, clubbing or edema.  Peripheral pulses are palpable. Psych: Nonverbal, noninteractive, spontaneous eye opening, CNS: Nonverbal, noninteractive. Skin: Warm and dry.  No rashes noted.  Data Reviewed:   CBC: Recent Labs  Lab 10/15/22 0341 10/16/22 0522 10/17/22 0538 10/17/22 1419  10/20/22 0425  WBC 25.0* 19.9* 11.0* 11.4* 10.4  HGB 12.8* 12.5* 12.4* 12.1* 11.5*  HCT 35.9* 35.1* 34.8* 34.5* 33.4*  MCV 79.8* 79.1* 79.8* 80.8 83.1  PLT 257 246 214 189 133*     Basic Metabolic Panel: Recent Labs  Lab 10/16/22 0522 10/17/22 0538 10/18/22 0738 Oct 24, 2022 0757 10/20/22 0425 10/20/22 1153  NA 140 145 143  --  140 144  K 3.5 3.3* 3.2*  --  2.5* 2.6*  CL 109 112* 109  --  109 110  CO2 19* 23 21*  --  20* 23  GLUCOSE 177* 120* 179*  --  345* 214*  BUN 48* 43* 41*  --  40* 36*  CREATININE 1.54* 1.47* 1.36*  --  1.42* 1.35*  CALCIUM 9.7 9.4 8.8*  --  7.9* 8.1*  MG 1.7 1.7 1.5* 1.8 1.8  --      Liver Function Tests: Recent Labs  Lab 10/15/22 0341 10/16/22 0522 10/17/22 0538  AST 40 63* 48*  ALT 61* 128* 130*  ALKPHOS 74 71 66  BILITOT 0.8 0.9 0.7  PROT 6.5 6.1* 5.7*  ALBUMIN 3.2* 3.1* 2.9*      Radiology Studies: EEG adult  Result Date: 24-Oct-2022 Lindie Spruce  Val Eagle, MD     10/19/2022  5:07 PM Patient Name: Mosses Mcdill. MRN: [id number] Epilepsy Attending: Charlsie Quest Referring Physician/Provider: Milon Dikes, MD Date: 10/19/2022 Duration: 36.23 mins Patient history: 72yo M with rapidly progressing dementia, 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 EEG to evaluate for seizure Level of alertness:  lethargic AEDs during EEG study: None Technical aspects: This EEG study was done with scalp electrodes positioned according to the 10-20 International system of electrode placement. Electrical activity was reviewed with band pass filter of 1-70Hz , sensitivity of 7 uV/mm, display speed of 26mm/sec with a 60Hz  notched filter applied as appropriate. EEG data were recorded continuously and digitally stored.  Video monitoring was available and reviewed as appropriate. Description: No clear posterior dominant rhythm was seen. EEG showed continuous generalized 3 to 6 Hz theta-delta slowing. Generalized periodic discharges with triphasic morphology at 1 Hz were noted, more prominent  when awake/stimulated.  Hyperventilation and photic stimulation were not performed.    ABNORMALITY - Periodic discharges with triphasic morphology, generalized - Continuous slow, generalized  IMPRESSION: This study is suggestive of moderate diffuse encephalopathy, nonspecific etiology. Generalized periodic discharges with triphasic morphology can be seen due to CJD but is not specific. No seizures or definite epileptiform discharges were seen throughout the recording.  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 EXAM: MRI HEAD WITHOUT AND WITH CONTRAST TECHNIQUE: Multiplanar, multiecho pulse sequences of the brain and surrounding structures were obtained without and with intravenous contrast. CONTRAST:  10mL GADAVIST GADOBUTROL 1 MMOL/ML IV SOLN COMPARISON:  MRI of the brain September 14, 2022. Head CT September 28, 2022. FINDINGS: Brain: Diffuse leptomeningeal enhancement along the cerebral, cerebellar and brainstem surfaces as well as along multiple cranial nerves. There are also multifocal areas of leptomeningeal nodular enhancement, more evident in a inferior left cerebellar sulcus, measuring approximately 6 mm, and in the right collateral sulcus, measuring approximately 10 mm. There is no restricted diffusion within the lesion to suggest an abscess. There is no cortical or deep gray nuclei restricted diffusion to suggest Creutzfeldt-Jakob disease. Confluent T2 hyperintensity within the white matter of the cerebral hemispheres, nonspecific. Supratentorial ventriculomegaly, mildly progressed since most recent CT and significantly progressed since CT performed in August 02, 2022. Effacement of the high convexity sulci. No evidence of an acute infarct or hemorrhage. Vascular: Normal flow voids. Skull and upper cervical spine: Normal marrow signal. Sinuses/Orbits: Negative. Other:  None. IMPRESSION: 1. Diffuse leptomeningeal enhancement along the cerebral, cerebellar and brainstem surfaces as well as along multiple cranial nerves, as detailed above. 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 2. Supratentorial ventriculomegaly, mildly progressed since most recent CT and significantly progressed since CT performed in August 02, 2022. Effacement of the high convexity sulci. 3. Confluent T2 hyperintensity within the white matter of the cerebral hemispheres, most likely related to chronic microangiopathy. Electronically Signed   By: Baldemar Lenis M.D.   On: 10/19/2022 15:18      LOS: 18 days    Joycelyn Das, MD [facility] Available via Epic secure chat 7am-7pm After these hours, please refer to coverage provider listed on [email or web address] 10/20/2022, 1:36 PM

## 2022-10-21 ENCOUNTER — Inpatient Hospital Stay (HOSPITAL_COMMUNITY): Payer: No Typology Code available for payment source

## 2022-10-21 DIAGNOSIS — G030 Nonpyogenic meningitis: Secondary | ICD-10-CM | POA: Diagnosis not present

## 2022-10-21 DIAGNOSIS — F419 Anxiety disorder, unspecified: Secondary | ICD-10-CM | POA: Diagnosis not present

## 2022-10-21 DIAGNOSIS — G9340 Encephalopathy, unspecified: Secondary | ICD-10-CM | POA: Diagnosis not present

## 2022-10-21 DIAGNOSIS — N179 Acute kidney failure, unspecified: Secondary | ICD-10-CM | POA: Diagnosis not present

## 2022-10-21 LAB — GLUCOSE, CAPILLARY
Glucose-Capillary: 223 mg/dL — ABNORMAL HIGH (ref 70–99)
Glucose-Capillary: 167 mg/dL — ABNORMAL HIGH (ref 70–99)
Glucose-Capillary: 145 mg/dL — ABNORMAL HIGH (ref 70–99)
Glucose-Capillary: 189 mg/dL — ABNORMAL HIGH (ref 70–99)

## 2022-10-21 LAB — MAGNESIUM: Magnesium: 1.9 mg/dL (ref 1.7–2.4)

## 2022-10-21 LAB — HEMOGLOBIN AND HEMATOCRIT, BLOOD
HCT: 35.4 % — ABNORMAL LOW (ref 39.0–52.0)
HCT: 34.1 % — ABNORMAL LOW (ref 39.0–52.0)
HCT: 34.9 % — ABNORMAL LOW (ref 39.0–52.0)
Hemoglobin: 11.3 g/dL — ABNORMAL LOW (ref 13.0–17.0)
Hemoglobin: 11.3 g/dL — ABNORMAL LOW (ref 13.0–17.0)
Hemoglobin: 11.7 g/dL — ABNORMAL LOW (ref 13.0–17.0)

## 2022-10-21 LAB — CBC
HCT: 33.3 % — ABNORMAL LOW (ref 39.0–52.0)
Hemoglobin: 11.4 g/dL — ABNORMAL LOW (ref 13.0–17.0)
MCH: 28.8 pg (ref 26.0–34.0)
MCHC: 34.2 g/dL (ref 30.0–36.0)
MCV: 84.1 fL (ref 80.0–100.0)
Platelets: 113 10*3/uL — ABNORMAL LOW (ref 150–400)
RBC: 3.96 MIL/uL — ABNORMAL LOW (ref 4.22–5.81)
RDW: 15.8 % — ABNORMAL HIGH (ref 11.5–15.5)
WBC: 9.8 10*3/uL (ref 4.0–10.5)
nRBC: 0 % (ref 0.0–0.2)

## 2022-10-21 LAB — ABO/RH: ABO/RH(D): A NEG

## 2022-10-21 LAB — BASIC METABOLIC PANEL
Anion gap: 8 (ref 5–15)
BUN: 32 mg/dL — ABNORMAL HIGH (ref 8–23)
CO2: 23 mmol/L (ref 22–32)
Calcium: 7.9 mg/dL — ABNORMAL LOW (ref 8.9–10.3)
Chloride: 113 mmol/L — ABNORMAL HIGH (ref 98–111)
Creatinine, Ser: 1.2 mg/dL (ref 0.61–1.24)
GFR, Estimated: >60 mL/min (ref >60)
Glucose, Bld: 340 mg/dL — ABNORMAL HIGH (ref 70–99)
Potassium: 2.8 mmol/L — ABNORMAL LOW (ref 3.5–5.1)
Sodium: 144 mmol/L (ref 135–145)

## 2022-10-21 LAB — TYPE AND SCREEN
ABO/RH(D): A NEG
Antibody Screen: NEGATIVE

## 2022-10-21 LAB — CULTURE, FUNGUS WITHOUT SMEAR

## 2022-10-21 MED ORDER — SODIUM CHLORIDE 0.9% IV SOLUTION: Freq: Once | INTRAVENOUS | Status: AC

## 2022-10-21 MED ORDER — IOHEXOL 350 MG/ML SOLN
75.0000 mL | Freq: Once | INTRAVENOUS | Status: AC | PRN
  Administered 2022-10-21: 75 mL via INTRAVENOUS

## 2022-10-21 MED ORDER — POTASSIUM CHLORIDE 10 MEQ/100ML IV SOLN
10.0000 meq | INTRAVENOUS | Status: AC
  Administered 2022-10-21 (×2): 10 meq via INTRAVENOUS
  Filled 2022-10-21 (×2): qty 100

## 2022-10-21 MED ORDER — SORBITOL 70 % SOLN
960.0000 mL | TOPICAL_OIL | Freq: Once | ORAL | Status: AC
  Administered 2022-10-21: 960 mL via RECTAL
  Filled 2022-10-21: qty 240

## 2022-10-21 MED ORDER — POTASSIUM CHLORIDE 10 MEQ/100ML IV SOLN
10.0000 meq | INTRAVENOUS | Status: AC
  Administered 2022-10-21 (×3): 10 meq via INTRAVENOUS
  Filled 2022-10-21 (×3): qty 100

## 2022-10-21 MED ORDER — PANTOPRAZOLE SODIUM 40 MG IV SOLR
40.0000 mg | Freq: Two times a day (BID) | INTRAVENOUS | Status: DC
  Administered 2022-10-21 – 2022-10-28 (×14): 40 mg via INTRAVENOUS
  Filled 2022-10-21 (×15): qty 10

## 2022-10-21 MED ORDER — MAGNESIUM SULFATE 2 GM/50ML IV SOLN
2.0000 g | Freq: Once | INTRAVENOUS | Status: AC
  Administered 2022-10-21: 2 g via INTRAVENOUS
  Filled 2022-10-21: qty 50

## 2022-10-21 NOTE — Progress Notes (Signed)
Patient passing bright red blood per rectum this morning.   Plan to change VTE ppx from Lovenox to SCDs, change diet to clears for now, type & screen, and trend H&H.

## 2022-10-21 NOTE — Progress Notes (Signed)
Neurology Progress Note   S:// Patient seen and examined.   O:// Current vital signs: BP (!) 140/82 (BP Location: Left Arm)   Pulse 91   Temp 97.7 F (36.5 C) (Oral)   Resp 16   Ht 5\' 10"  (1.778 m)   Wt 102.1 kg   SpO2 96%   BMI 32.28 kg/m  Vital signs in last 24 hours: Temp:  [97.3 F (36.3 C)-98 F (36.7 C)] 97.7 F (36.5 C) (05/05 0845) Pulse Rate:  [85-92] 91 (05/05 0845) Resp:  [16-18] 16 (05/05 0845) BP: (131-144)/(80-89) 140/82 (05/05 0845) SpO2:  [93 %-100 %] 96 % (05/05 0845) General awake alert not respond to voice HEENT: Normocephalic atraumatic Lungs: Clear Cardiovascular: Regular rhythm Neurologic exam He is awake alert He tracks me.  Today he is also able to reach to my hand when I give him my hand to shake.  Possibly also wiggle toes to command but that was inconsistent. He is completely nonverbal He has constant small myoclonic twitches of all his body. On loud clap, I question startle myoclonus. Cranial nerves II to XII tested within constraints of his mental status appear intact Motor examination has no drift but he has mild increased tone in all extremities Sensation appears intact based on grimace and withdrawal He is hyperreflexic   Medications  Current Facility-Administered Medications:    acetaminophen (TYLENOL) tablet 650 mg, 650 mg, Oral, Q6H PRN, Lonia Blood, MD, 650 mg at 09/30/22 1610   acetaminophen (TYLENOL) tablet 650 mg, 650 mg, Oral, Daily PRN, Ann Held, RPH   amLODipine (NORVASC) tablet 10 mg, 10 mg, Oral, Daily, Danford, Earl Lites, MD, 10 mg at 10/21/22 0840   feeding supplement (GLUCERNA SHAKE) (GLUCERNA SHAKE) liquid 237 mL, 237 mL, Oral, TID BM, Pokhrel, Laxman, MD, 237 mL at 10/21/22 0841   folic acid (FOLVITE) tablet 1 mg, 1 mg, Oral, Daily, Jetty Duhamel T, MD, 1 mg at 10/21/22 0840   insulin aspart (novoLOG) injection 0-15 Units, 0-15 Units, Subcutaneous, TID WC, Pokhrel, Laxman, MD, 3 Units at  10/20/22 1233   insulin glargine-yfgn (SEMGLEE) injection 30 Units, 30 Units, Subcutaneous, Daily, Pokhrel, Laxman, MD, 30 Units at 10/21/22 0847   lidocaine (PF) (XYLOCAINE) 1 % injection 5 mL, 5 mL, Other, Once, Samtani, Jai-Gurmukh, MD   lipase/protease/amylase (CREON) capsule 36,000 Units, 36,000 Units, Oral, TID AC, Lonia Blood, MD, 36,000 Units at 10/20/22 1233   methadone (DOLOPHINE) tablet 10 mg, 10 mg, Oral, TID, Pokhrel, Laxman, MD, 10 mg at 10/21/22 0840   ondansetron (ZOFRAN) tablet 4 mg, 4 mg, Oral, Q6H PRN **OR** ondansetron (ZOFRAN) injection 4 mg, 4 mg, Intravenous, Q6H PRN, Darreld Mclean R, MD, 4 mg at 10/19/22 2107   pantoprazole (PROTONIX) injection 40 mg, 40 mg, Intravenous, Q12H, Pokhrel, Laxman, MD, 40 mg at 10/21/22 0841   potassium chloride (KLOR-CON) packet 40 mEq, 40 mEq, Oral, BID, Opyd, Lavone Neri, MD, 40 mEq at 10/21/22 0840   potassium chloride 10 mEq in 100 mL IVPB, 10 mEq, Intravenous, Q1 Hr x 3, Opyd, Timothy S, MD, Last Rate: 100 mL/hr at 10/21/22 0839, 10 mEq at 10/21/22 0839   senna-docusate (Senokot-S) tablet 1 tablet, 1 tablet, Oral, QHS, Samtani, Jai-Gurmukh, MD, 1 tablet at 10/20/22 2103   sodium chloride flush (NS) 0.9 % injection 3 mL, 3 mL, Intravenous, Q12H, Darreld Mclean R, MD, 3 mL at 10/20/22 2109 Labs CBC    Component Value Date/Time   WBC 9.8 10/21/2022 0230   RBC 3.96 (L) 10/21/2022  0230   HGB 11.3 (L) 10/21/2022 0712   HCT 35.4 (L) 10/21/2022 0712   PLT 113 (L) 10/21/2022 0230   MCV 84.1 10/21/2022 0230   MCV 83.0 11/30/2014 1835   MCH 28.8 10/21/2022 0230   MCHC 34.2 10/21/2022 0230   RDW 15.8 (H) 10/21/2022 0230   LYMPHSABS 1.8 09/28/2022 1555   MONOABS 1.4 (H) 09/28/2022 1555   EOSABS 0.0 09/28/2022 1555   BASOSABS 0.0 09/28/2022 1555    CMP     Component Value Date/Time   NA 144 10/21/2022 0230   K 2.8 (L) 10/21/2022 0230   CL 113 (H) 10/21/2022 0230   CO2 23 10/21/2022 0230   GLUCOSE 340 (H) 10/21/2022 0230   BUN 32  (H) 10/21/2022 0230   CREATININE 1.20 10/21/2022 0230   CREATININE 1.13 11/30/2014 1835   CALCIUM 7.9 (L) 10/21/2022 0230   PROT 5.7 (L) 10/17/2022 0538   ALBUMIN 2.9 (L) 10/17/2022 0538   AST 48 (H) 10/17/2022 0538   ALT 130 (H) 10/17/2022 0538   ALKPHOS 66 10/17/2022 0538   BILITOT 0.7 10/17/2022 0538   GFRNONAA >60 10/21/2022 0230   GFRAA >60 10/11/2015 1225   CSF 10/03/2022 with glucose 67, RBC 18, WBC 60, 87% monocyte macrophage complex, protein 365. CSF on 10/11/2022 with glucose 43, 1 RBC, 29 WBC, 61% monocyte macrophage, 38% lymphocyte, xanthochromic, protein 272.  Imaging I have reviewed images in epic and the results pertinent to this consultation are: MR brain 09/14/2022-no acute changes. 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  The supratentorial ventriculomegaly has progressed since the most recent CT scan and significantly progressed since the CT scan of February 2024 with effacement also of high convexity sulci.  Confluent T2 hyperintensity within the white matter likely related to chronic microangiopathy.   Assessment: Patient with rapidly progressing dementia, over the past few months, with initial concern for hydrocephalus as well as possible fungal infection, has gone through extensive infectious workup with autoimmune encephalitis panel and paraneoplastic panel and CSF pending. LP was done twice in April.  Spinal tap showed increased pressures both times.  Increased protein and white cells with concerns for infectious etiology which has been extensively worked up and no clear identifiable cause.  Cytology did not reveal any malignant cells on both exams. Based on my first exam this week and the clinical course of rapid progressive dementia, I suspect that he might have  underlying prion disease and I recommended CJD testing which is underway. I also recommended repeat EEG and MRI. The EEG shows diffuse leptomeningeal enhancement along the cerebellar and cerebral as well as brainstem surfaces and multiple cranial nerves concerning for granulomatous process versus carcinomatous meningitis but the CSF analysis has been unremarkable other than increased protein and cells with cytology being unrevealing.  CJD testing was sent prior to the repeat MR imaging.  He has had multiple EEGs and the most recent EEG from 10/19/2022 shows periodic discharges with triphasic morphology which in the correct clinical scenario can be seen with CJD but can also be seen in variety of other conditions.   Rapidly progressing dementia with a wide differential.  CJD testing sent out.  Autoimmune and paraneoplastic testing also pending.  Angiotensin-converting enzyme on serum pending.  Recommendations: Continue supportive care per primary team as you are Await CJD testing which may take up to  2 weeks.  It was sent out on Thursday or Friday-5/2 or 5/3. Check for underlying occult malignancy with a CT chest abdomen pelvis with and without contrast Tuberculosis testing and serum has been negative-ID is following. Serum angiotensin-converting enzyme has been ordered and is pending CSF autoimmune panel and paraneoplastic panel is also pending.  CSF ACE was not sent-I checked with the core lab as well as microbiology-they do not have any more CSF left.  The only other place to check his cytopathology which is closed today.  We will need to check on Monday to see if they have any CSF left for CSF ACE order.  Neurology will follow  Plan discussed with Dr. Tyson Babinski  -- Milon Dikes, MD Neurologist [facility] Pager: [phone]

## 2022-10-21 NOTE — Progress Notes (Addendum)
PROGRESS NOTE    Jeremy Medina.  ZOX:[id number] DOB: 1950/09/19 DOA: 09/28/2022 PCP: [facility]    Brief Narrative:   [name]. is a 72 y.o. 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  He was discharged home with neurology and neurosurgery follow-up.  This time, patient presented with AKI and had to see neurosurgery clinic and was sent to the ER for LP.    Sequence of events as below.      4/12: Admitted 4/14: Cr normalized, persistent encephalopathy noted 4/15: Neurology consulted 4/17: LP obtained 4/18: ID consulted, amphotericin started 4/19: Patient more alert 4/20-4/24--intermittent waxing waning mentation 4/25 immediately post LP more lucid-conversant short sentences---Fungal meningitis Etiology now questioned--LP shows 29 WBC on champagne tap---Broadened Abx coverage 4/26.  Broad coverage was discontinued by ID-still awaiting labs-long-term EEG planned for tonight  4/27 EEG grossly negative, amphotericin B stopped, IV fluids reinitiated 2/2 AKI, IV steroids Solu-Medrol started, working up autoimmune encephalitis and getting ANA and other autoimmune panel 4/28 CT chest abdomen pelvis negative for any gross pathology other than potentially some mild proctitis which may be an overcall 4/30-neurosurgery consulted-not a candidate for shunt placement based on discussions with them, palliative care consulted for goals of care    Assessment and Plan:  Progressive subacute encephalopathy Progressive encephalopathy since January.  Patient was admitted from neurosurgery office for concerns of mental status changes. CT head and MRV showed no acute process.   No improvement with IV thiamine.  Initial LP obtained that showed elevated protein and WBC.  No substantial improvement with initial LP,  Neurology suspected fungal meningitis.  LP performed again 4/25--34 cm water opening pressure patient evaluated subsequent to procedure and mentation improved he was verbalizing more felt by neurology to be secondary to relief of CSF pressure.  Patient was initially on Amphotericin which was discontinued. . ESR 45 CRP less than 0.5 rheumatoid factor, ANA are negative. 4/25 LP: Encephalitis panel negative, CSF Gram stain NGTD fungal CSF negative.   CT scan of the chest abdomen pelvis negative.  Neurosurgery indicated that the patient is a poor candidate for shunt placement.  Neurology has reassessed the patient on 10/18/2022 with concern for CJD.  CSF fluid analysis has been sent.  At this time plan is to proceed with a CT scan of the abdomen chest and pelvis with contrast to rule out occult malignancy.  Bright red blood per rectum.  Reported this morning.  Uncertain etiology.  Could be diverticular.  Hemoglobin has remained stable.  Hemoglobin of 11.4 from 11.5.  Will continue to monitor hemoglobin.  Transfuse as necessary.  Has been started on IV PPI.  Plan for CT scan of the chest abdomen and pelvis with contrast today.  If continues to bleed might need GI evaluation.  Patient might not be a good candidate for endoscopic evaluation at this time due to decreased mental status.  Hypokalemia; potassium 2.8 today.  Will give IV potassium 60 mEq additional today.  Patient unable to take orally.  Hypomagnesemia.  Magnesium of 1. 9 today.  Will give 1 dose of IV magnesium sulfate as well.   Acute kidney injury Latest creatinine of 1.2.  Put the patient on some IV fluids with potassium.   Chronic pain syndrome Currently on methadone 10 mg 3 times daily   Transaminitis Likely NASH given  normal [place].  Check BMP in AM.   Diabetes mellitus type 2, controlled, without complications On 70/30 insulin at home.  Currently on Lantus and sliding scale insulin.  Latest POC glucose of 223.  Benign essential  hypertension As needed hydralazine.  Lisinopril on hold.  On amlodipine.  Obesity (BMI 30-39.9) BMI 32.  Might benefit from weight loss as outpatient.   Folate deficiency   Continue folate supplement.   Anxiety Was on Ativan which was discontinued 4/28.   Goals of care discussion.  Palliative care on board.    DVT prophylaxis: SCDs Start: 10/21/22 0604 Place and maintain sequential compression device Start: 10/02/22 1610   Code Status:     Code Status: Full Code  Disposition:  Uncertain at this time, pending clinical improvement.  Status is: Inpatient  Remains inpatient appropriate because: Persistent encephalopathy, pending clinical improvement.   Family Communication:  Spoke with the patient's son on the phone and updated him about the clinical condition of the patient on 10/21/2022.  Consultants:  Neurosurgery Palliative care Neurology  Procedures:  Lumbar puncture 10/02/2020. EEG 4/27.  Antimicrobials:  None at this time  Subjective: Today, patient was seen and examined at bedside.  Tracking some and was able to squeeze my hand but nonverbal. Staff reported that patient did have some bright red blood per her rectum in the morning.  Objective: Vitals:   10/20/22 1958 10/21/22 0600 10/21/22 0608 10/21/22 0845  BP: 131/83 (!) 144/89 (!) 144/89 (!) 140/82  Pulse: 85 92 90 91  Resp: 16 18 16 16   Temp: 97.7 F (36.5 C) 98 F (36.7 C) 98 F (36.7 C) 97.7 F (36.5 C)  TempSrc: Oral Axillary Oral Oral  SpO2: 99% 98% 100% 96%  Weight:      Height:        Intake/Output Summary (Last 24 hours) at 10/21/2022 1138 Last data filed at 10/21/2022 0530 Gross per 24 hour  Intake 99.86 ml  Output 1800 ml  Net -1700.14 ml    Filed Weights   10/04/22 1100  Weight: 102.1 kg    Physical Examination: Body mass index is 32.28 kg/m.   General: Tracking, squeezing hands on command, spontaneous eye movement, nonverbal, HENT:   No scleral pallor or icterus noted. Oral  mucosa is moist.  Chest:  Clear breath sounds.   No crackles or wheezes.  CVS: S1 &S2 heard. No murmur.  Regular rate and rhythm. Abdomen: Soft, nontender, nondistended.  Bowel sounds are heard.   Extremities: No cyanosis, clubbing or edema.  Peripheral pulses are palpable. Psych: Nonverbal, noninteractive, spontaneous eye opening. CNS: Nonverbal, noninteractive. Skin: Warm and dry.  No rashes noted.  Data Reviewed:   CBC: Recent Labs  Lab 10/16/22 0522 10/17/22 0538 10/17/22 1419 10/20/22 0425 10/21/22 0230 10/21/22 0712  WBC 19.9* 11.0* 11.4* 10.4 9.8  --   HGB 12.5* 12.4* 12.1* 11.5* 11.4* 11.3*  HCT 35.1* 34.8* 34.5* 33.4* 33.3* 35.4*  MCV 79.1* 79.8* 80.8 83.1 84.1  --   PLT 246 214 189 133* 113*  --      Basic Metabolic Panel: Recent Labs  Lab 10/17/22 0538 10/18/22 0738 10/19/22 0757 10/20/22 0425 10/20/22 1153 10/21/22 0230  NA 145 143  --  140 144 144  K 3.3* 3.2*  --  2.5* 2.6* 2.8*  CL 112* 109  --  109 110 113*  CO2 23 21*  --  20* 23 23  GLUCOSE 120* 179*  --  345* 214* 340*  BUN 43* 41*  --  40* 36* 32*  CREATININE 1.47* 1.36*  --  1.42* 1.35* 1.20  CALCIUM 9.4 8.8*  --  7.9* 8.1* 7.9*  MG 1.7 1.5* 1.8 1.8  --  1.9     Liver Function Tests: Recent Labs  Lab 10/15/22 0341 10/16/22 0522 10/17/22 0538  AST 40 63* 48*  ALT 61* 128* 130*  ALKPHOS 74 71 66  BILITOT 0.8 0.9 0.7  PROT 6.5 6.1* 5.7*  ALBUMIN 3.2* 3.1* 2.9*      Radiology Studies: EEG adult  Result Date: 10-20-2022 Jeremy Quest, MD     10-20-2022  5:07 PM Patient Name: Jeremy Medina. MRN: [id number] Epilepsy Attending: Charlsie Medina Referring Physician/Provider: Milon Dikes, MD Date: 10/20/22 Duration: 36.23 mins Patient history: 72yo M with rapidly progressing dementia, 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 EEG to evaluate for seizure Level of alertness:  lethargic AEDs during EEG study: None Technical aspects: This EEG study was done with scalp electrodes positioned according to  the 10-20 International system of electrode placement. Electrical activity was reviewed with band pass filter of 1-70Hz , sensitivity of 7 uV/mm, display speed of 90mm/sec with a 60Hz  notched filter applied as appropriate. EEG data were recorded continuously and digitally stored.  Video monitoring was available and reviewed as appropriate. Description: No clear posterior dominant rhythm was seen. EEG showed continuous generalized 3 to 6 Hz theta-delta slowing. Generalized periodic discharges with triphasic morphology at 1 Hz were noted, more prominent when awake/stimulated.  Hyperventilation and photic stimulation were not performed.    ABNORMALITY - Periodic discharges with triphasic morphology, generalized - Continuous slow, generalized  IMPRESSION: This study is suggestive of moderate diffuse encephalopathy, nonspecific etiology. Generalized periodic discharges with triphasic morphology can be seen due to CJD but is not specific. No seizures or definite epileptiform discharges were seen throughout the recording.  Jeremy Medina   MR BRAIN W WO CONTRAST  Result Date: 2022/10/20 CLINICAL DATA:  Dementia, nonvascular etiology suspected; suspect prion (CJD) due to rapid progressing dementia. EXAM: MRI HEAD WITHOUT AND WITH CONTRAST TECHNIQUE: Multiplanar, multiecho pulse sequences of the brain and surrounding structures were obtained without and with intravenous contrast. CONTRAST:  10mL GADAVIST GADOBUTROL 1 MMOL/ML IV SOLN COMPARISON:  MRI of the brain September 14, 2022. Head CT September 28, 2022. FINDINGS: Brain: Diffuse leptomeningeal enhancement along the cerebral, cerebellar and brainstem surfaces as well as along multiple cranial nerves. There are also multifocal areas of leptomeningeal nodular enhancement, more evident in a inferior left cerebellar sulcus, measuring approximately 6 mm, and in the right collateral sulcus, measuring approximately 10 mm. There is no restricted diffusion within the lesion to  suggest an abscess. There is no cortical or deep gray nuclei restricted diffusion to suggest Creutzfeldt-Jakob disease. Confluent T2 hyperintensity within the white matter of the cerebral hemispheres, nonspecific. Supratentorial ventriculomegaly, mildly progressed since most recent CT and significantly progressed since CT performed in August 02, 2022. Effacement of the high convexity sulci. No evidence of an acute infarct or hemorrhage. Vascular: Normal flow voids. Skull and upper cervical spine: Normal marrow signal. Sinuses/Orbits: Negative. Other: None. IMPRESSION: 1. Diffuse leptomeningeal enhancement along the cerebral, cerebellar and brainstem surfaces as well as along multiple cranial nerves, as detailed above. 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 2. Supratentorial ventriculomegaly, mildly progressed since most recent CT and significantly progressed since CT performed in August 02, 2022. Effacement of the high convexity sulci. 3. Confluent T2 hyperintensity within the white matter of the cerebral hemispheres, most likely related to  chronic microangiopathy. Electronically Signed   By: Baldemar Lenis M.D.   On: 10/19/2022 15:18      LOS: 19 days    Joycelyn Das, MD [facility] Available via Epic secure chat 7am-7pm After these hours, please refer to coverage provider listed on [email or web address] 10/21/2022, 11:38 AM

## 2022-10-22 DIAGNOSIS — G8929 Other chronic pain: Secondary | ICD-10-CM | POA: Diagnosis not present

## 2022-10-22 DIAGNOSIS — K625 Hemorrhage of anus and rectum: Secondary | ICD-10-CM

## 2022-10-22 DIAGNOSIS — G9340 Encephalopathy, unspecified: Secondary | ICD-10-CM | POA: Diagnosis not present

## 2022-10-22 DIAGNOSIS — G030 Nonpyogenic meningitis: Secondary | ICD-10-CM | POA: Diagnosis not present

## 2022-10-22 DIAGNOSIS — F419 Anxiety disorder, unspecified: Secondary | ICD-10-CM | POA: Diagnosis not present

## 2022-10-22 DIAGNOSIS — R4182 Altered mental status, unspecified: Secondary | ICD-10-CM

## 2022-10-22 DIAGNOSIS — B9681 Helicobacter pylori [H. pylori] as the cause of diseases classified elsewhere: Secondary | ICD-10-CM | POA: Diagnosis not present

## 2022-10-22 DIAGNOSIS — R7989 Other specified abnormal findings of blood chemistry: Secondary | ICD-10-CM | POA: Diagnosis not present

## 2022-10-22 DIAGNOSIS — R933 Abnormal findings on diagnostic imaging of other parts of digestive tract: Secondary | ICD-10-CM

## 2022-10-22 DIAGNOSIS — N179 Acute kidney failure, unspecified: Secondary | ICD-10-CM | POA: Diagnosis not present

## 2022-10-22 DIAGNOSIS — K5641 Fecal impaction: Secondary | ICD-10-CM

## 2022-10-22 DIAGNOSIS — Z794 Long term (current) use of insulin: Secondary | ICD-10-CM

## 2022-10-22 LAB — GLUCOSE, CAPILLARY
Glucose-Capillary: 390 mg/dL — ABNORMAL HIGH (ref 70–99)
Glucose-Capillary: 367 mg/dL — ABNORMAL HIGH (ref 70–99)
Glucose-Capillary: 197 mg/dL — ABNORMAL HIGH (ref 70–99)
Glucose-Capillary: 287 mg/dL — ABNORMAL HIGH (ref 70–99)
Glucose-Capillary: 222 mg/dL — ABNORMAL HIGH (ref 70–99)
Glucose-Capillary: 254 mg/dL — ABNORMAL HIGH (ref 70–99)
Glucose-Capillary: 183 mg/dL — ABNORMAL HIGH (ref 70–99)

## 2022-10-22 LAB — CBC
HCT: 30.4 % — ABNORMAL LOW (ref 39.0–52.0)
Hemoglobin: 9.9 g/dL — ABNORMAL LOW (ref 13.0–17.0)
MCH: 28.4 pg (ref 26.0–34.0)
MCHC: 32.6 g/dL (ref 30.0–36.0)
MCV: 87.1 fL (ref 80.0–100.0)
Platelets: 128 10*3/uL — ABNORMAL LOW (ref 150–400)
RBC: 3.49 MIL/uL — ABNORMAL LOW (ref 4.22–5.81)
RDW: 15.9 % — ABNORMAL HIGH (ref 11.5–15.5)
WBC: 10.2 10*3/uL (ref 4.0–10.5)
nRBC: 0 % (ref 0.0–0.2)

## 2022-10-22 LAB — MAGNESIUM: Magnesium: 1.8 mg/dL (ref 1.7–2.4)

## 2022-10-22 LAB — BASIC METABOLIC PANEL
Anion gap: 8 (ref 5–15)
BUN: 28 mg/dL — ABNORMAL HIGH (ref 8–23)
CO2: 22 mmol/L (ref 22–32)
Calcium: 7.9 mg/dL — ABNORMAL LOW (ref 8.9–10.3)
Chloride: 112 mmol/L — ABNORMAL HIGH (ref 98–111)
Creatinine, Ser: 1.2 mg/dL (ref 0.61–1.24)
GFR, Estimated: >60 mL/min (ref >60)
Glucose, Bld: 510 mg/dL (ref 70–99)
Potassium: 4.1 mmol/L (ref 3.5–5.1)
Sodium: 142 mmol/L (ref 135–145)

## 2022-10-22 LAB — MISC LABCORP TEST (SEND OUT): Labcorp test code: 81695

## 2022-10-22 LAB — ANGIOTENSIN CONVERTING ENZYME: Angiotensin-Converting Enzyme: 19 U/L (ref 14–82)

## 2022-10-22 MED ORDER — INSULIN ASPART 100 UNIT/ML IJ SOLN
8.0000 [IU] | Freq: Once | INTRAMUSCULAR | Status: AC
  Administered 2022-10-22: 8 [IU] via SUBCUTANEOUS

## 2022-10-22 MED ORDER — PEG-KCL-NACL-NASULF-NA ASC-C 100 G PO SOLR
0.5000 | Freq: Once | ORAL | Status: AC
  Administered 2022-10-22: 100 g via ORAL
  Filled 2022-10-22: qty 1

## 2022-10-22 MED ORDER — INSULIN ASPART 100 UNIT/ML IJ SOLN
0.0000 [IU] | Freq: Every day | INTRAMUSCULAR | Status: DC
  Administered 2022-10-25: 4 [IU] via SUBCUTANEOUS
  Administered 2022-10-27: 5 [IU] via SUBCUTANEOUS

## 2022-10-22 MED ORDER — PEG-KCL-NACL-NASULF-NA ASC-C 100 G PO SOLR
0.5000 | Freq: Once | ORAL | Status: AC
  Administered 2022-10-22: 100 g via ORAL

## 2022-10-22 MED ORDER — PEG-KCL-NACL-NASULF-NA ASC-C 100 G PO SOLR: 1.0000 | Freq: Once | ORAL | Status: DC

## 2022-10-22 NOTE — Progress Notes (Signed)
Physical Therapy Treatment Patient Details Name: Jeremy Medina. MRN: [id number] DOB: 14-Nov-1950 Today's Date: 10/22/2022   History of Present Illness Jeremy Look. is a 72 y.o. male who presents complaining of altered mental status and progressive decline.  MRI performed at [facility], concern for hydrocephalus and neurosurgeon recommended pt present to ED. Work-up pending. LP 10/03/2022. PMHx: type 2 diabetes, PTSD, chronic pain on methadone per son, hypertension, hyperlipidemia    PT Comments    Pt admitted with above diagnosis. Pt was talking to PT today however still not following commands as well as needing +2 assist for sitting EOB.  Pt with significant posterior and right lateral lean and could not right pt in sitting EOB.  Will continue efforts to mobilize however recommend nursing to use [employer] to get pt OOB daily as this will be safest way for OOB for pt at this point.  Pt currently with functional limitations due to balance and endurance deficits. Pt will benefit from acute skilled PT to increase their independence and safety with mobility to allow discharge.      Recommendations for follow up therapy are one component of a multi-disciplinary discharge planning process, led by the attending physician.  Recommendations may be updated based on patient status, additional functional criteria and insurance authorization.  Follow Up Recommendations  Can patient physically be transported by private vehicle: No    Assistance Recommended at Discharge Frequent or constant Supervision/Assistance  Patient can return home with the following Two people to help with walking and/or transfers;Assistance with cooking/housework;Help with stairs or ramp for entrance;Assist for transportation   Equipment Recommendations  Other (comment) (TBA)    Recommendations for Other Services       Precautions / Restrictions Precautions Precautions: Fall Precaution Comments: posterior  lean Restrictions Weight Bearing Restrictions: No     Mobility  Bed Mobility Overal bed mobility: Needs Assistance Bed Mobility: Supine to Sit Rolling: Max assist, +2 for physical assistance Sidelying to sit: Max assist, +2 for physical assistance   Sit to supine: Total assist, +2 for physical assistance   General bed mobility comments: Cleaned pt of BM prior to sitting EOB. Pt needed incr assist to come to eOB with total assist to initiate movement as pt not following commands and with significantly incr posterior bias.  Ultimately had to lie pt back down after a few min EOB as pt leaning to right and posteriorly so much so that PT couldnt right pt even with +2 total assist.  Difficult to keep in sitting position as he is impulsive and cannot follow commands to sit upright.    Transfers                   General transfer comment: unable as pt couldnt sit EOB    Ambulation/Gait               General Gait Details: unable to attempt   Stairs             Wheelchair Mobility    Modified Rankin (Stroke Patients Only)       Balance Overall balance assessment: Needs assistance Sitting-balance support: Feet supported, Bilateral upper extremity supported Sitting balance-Leahy Scale: Poor Sitting balance - Comments: strong posterior and right bias Postural control: Posterior lean, Right lateral lean                                  Cognition  Arousal/Alertness: Awake/alert Behavior During Therapy: Flat affect Overall Cognitive Status: Difficult to assess Area of Impairment: Problem solving, Awareness, Safety/judgement, Following commands, Attention                 Orientation Level: Situation, Time, Place Current Attention Level: Selective Memory: Decreased recall of precautions, Decreased short-term memory Following Commands: Follows one step commands inconsistently, Follows one step commands with increased time Safety/Judgement:  Decreased awareness of safety, Decreased awareness of deficits Awareness: Intellectual Problem Solving: Slow processing, Requires verbal cues, Requires tactile cues General Comments: Pt did speak to therapist today but still not following commands consistently.        Exercises Other Exercises Other Exercises: Pt resists active movement    General Comments        Pertinent Vitals/Pain Pain Assessment Pain Assessment: Faces Faces Pain Scale: Hurts even more Breathing: normal Negative Vocalization: none Facial Expression: smiling or inexpressive Body Language: relaxed Consolability: no need to console PAINAD Score: 0 Pain Location: grimacing with movmenet Pain Descriptors / Indicators: Grimacing Pain Intervention(s): Limited activity within patient's tolerance, Monitored during session, Repositioned    Home Living                          Prior Function            PT Goals (current goals can now be found in the care plan section) Acute Rehab PT Goals Patient Stated Goal: unable to state Progress towards PT goals: Not progressing toward goals - comment (Pt has had decline in function and neuro w/u is continued.)    Frequency    Min 2X/week      PT Plan Current plan remains appropriate;Frequency needs to be updated    Co-evaluation              AM-PAC PT "6 Clicks" Mobility   Outcome Measure  Help needed turning from your back to your side while in a flat bed without using bedrails?: Total Help needed moving from lying on your back to sitting on the side of a flat bed without using bedrails?: Total Help needed moving to and from a bed to a chair (including a wheelchair)?: Total Help needed standing up from a chair using your arms (e.g., wheelchair or bedside chair)?: Total Help needed to walk in hospital room?: Total Help needed climbing 3-5 steps with a railing? : Total 6 Click Score: 6    End of Session Equipment Utilized During  Treatment: Gait belt Activity Tolerance: Patient limited by fatigue Patient left: in bed;with bed alarm set;with call bell/phone within reach Nurse Communication: Mobility status;Need for lift equipment PT Visit Diagnosis: Unsteadiness on feet (R26.81);Muscle weakness (generalized) (M62.81)     Time: 1610-9604 PT Time Calculation (min) (ACUTE ONLY): 23 min  Charges:  $Therapeutic Activity: 23-37 mins                     [facility] M,PT Acute Rehab Services [phone]    Jeremy Medina 10/22/2022, 12:41 PM

## 2022-10-22 NOTE — Progress Notes (Signed)
TRH night cross cover note:   I was notified by RN that the patient's blood sugar per BMP this morning is found to be 510.  He has existing orders for basal insulin as well as sliding scale coverage with before every meal Accu-Cheks.   I subsequently ordered NovoLog 8 units subcu x 1 dose now along with a repeat CBG to be checked approximately 1 hour.  I also added nightly sliding scale coverage starting on the evening of 10/22/2022.     Newton Pigg, DO Hospitalist

## 2022-10-22 NOTE — Inpatient Diabetes Management (Signed)
Inpatient Diabetes Program Recommendations  AACE/ADA: New Consensus Statement on Inpatient Glycemic Control (2015)  Target Ranges:  Prepandial:   less than 140 mg/dL      Peak postprandial:   less than 180 mg/dL (1-2 hours)      Critically ill patients:  140 - 180 mg/dL   Lab Results  Component Value Date   GLUCAP 197 (H) 10/22/2022   HGBA1C 9.7 (H) 09/13/2022    Review of Glycemic Control  Latest Reference Range & Units 10/21/22 09:25 10/21/22 13:12 10/21/22 16:42 10/21/22 19:52 10/22/22 02:11 10/22/22 03:01 10/22/22 08:25  Glucose-Capillary 70 - 99 mg/dL 130 (H) 865 (H) 784 (H) 189 (H) 390 (H) 367 (H) 197 (H)   rapidly progressing dementia, 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 EEG to evaluate for seizure  Diabetes history: DM 2 Outpatient Diabetes medications: 70/30 0-20 units tid Current orders for Inpatient glycemic control:  Semglee 30 units Daily Novolog 0-15 units tid + hs  Glucerna tid with meals  Inpatient Diabetes Program Recommendations:    Glucose trends increase at night time.  -   Consider increasing frequency of Novolog Correction to Q4 hours.  Thanks,  Christena Deem RN, MSN, BC-ADM Inpatient Diabetes Coordinator Team Pager [phone] (8a-5p)

## 2022-10-22 NOTE — Progress Notes (Addendum)
PROGRESS NOTE    Jeremy Medina.  YQI:[id number] DOB: 02-28-1951 DOA: 09/28/2022 PCP: [facility]    Brief Narrative:   [name]. is a 72 y.o. 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  He was discharged home with neurology and neurosurgery follow-up.  This time, patient presented with AKI and had to see neurosurgery clinic and was sent to [facility] ER for LP.    Sequence of events as below.      4/12: Admitted 4/14: Cr normalized, persistent encephalopathy noted 4/15: Neurology consulted 4/17: LP obtained 4/18: ID consulted, amphotericin started 4/19: Patient more alert 4/20-4/24--intermittent waxing waning mentation 4/25 immediately post LP more lucid-conversant short sentences---Fungal meningitis Etiology now questioned--LP shows 29 WBC on champagne tap---Broadened Abx coverage 4/26.  Broad coverage was discontinued by ID-still awaiting labs-long-term EEG planned for tonight  4/27 EEG grossly negative, amphotericin B stopped, IV fluids reinitiated 2/2 AKI, IV steroids Solu-Medrol started, working up autoimmune encephalitis and getting ANA and other autoimmune panel 4/28 CT chest abdomen pelvis negative for any gross pathology other than potentially some mild proctitis which may be an overcall 4/30-neurosurgery consulted-not a candidate for shunt placement based on discussions with them, palliative care consulted for goals of care 5/5-mention of red blood per rectum GI was consulted    Assessment and Plan:  Progressive subacute encephalopathy Progressive encephalopathy since January.  Patient was admitted from neurosurgery office for concerns of mental status changes. CT head and MRV showed no acute process.   No improvement with IV thiamine.  Initial LP obtained that showed elevated protein and  WBC.  No substantial improvement with initial LP, Neurology suspected fungal meningitis.  LP performed again 4/25--34 cm water opening pressure patient evaluated subsequent to procedure and mentation improved he was verbalizing more felt by neurology to be secondary to relief of CSF pressure.  Patient was initially on Amphotericin which was discontinued. ESR 45 CRP less than 0.5 rheumatoid factor, ANA are negative. 4/25 LP: Encephalitis panel negative, CSF Gram stain NGTD fungal CSF negative.   CT scan of [facility] chest abdomen pelvis negative.  Neurosurgery indicated that [facility] patient is a poor candidate for shunt placement.  Neurology has reassessed [facility] patient on 10/18/2022 with concern for CJD.  CSF fluid analysis has been sent.  CT scan of [facility] abdomen chest and pelvis with contrast to rule out occult malignancy was performed on 10/21/2022 with no evidence of primary malignancy but large rectal soft bowel around 23 cm..  Bright red blood per rectum/constipation.[name]  Possibly secondary to stercoral ulcer.   Hemoglobin of 9.9 today from 12.1, five days back.  Patient did have smog enema yesterday with multiple bowel movements without any blood.  Will continue to ensure that [facility] patient is not constipated.  Continue PPI for now.  GI on board.  On bowel regimen at this time.  Hypokalemia; potassium of 4.1 today.  Received IV potassium 60 mEq yesterday.  Continue to monitor.  Check BMP in AM.  Hypomagnesemia.  Magnesium of 1. 8 today.  Replenished with magnesium supplements yesterday.   Acute kidney injury Latest creatinine of 1.2.  Received IV fluids with potassium.   Chronic pain syndrome Currently on methadone 10 mg 3 times daily   Transaminitis Likely NASH given normal [place].  Check CMP in AM.   Diabetes mellitus type 2,  with hyperglycemia.  On 70/30 insulin at home.  Currently on Lantus and sliding scale insulin.  Latest POC glucose 197, had received supplemental insulin this morning for blood glucose level of  510.  Benign essential hypertension As needed hydralazine.  Lisinopril on hold.  On amlodipine.  Obesity (BMI 30-39.9) BMI 32.  Might benefit from weight loss as outpatient.   Folate deficiency   Continue folate supplement.   Anxiety Was on Ativan which was discontinued 4/28.   Goals of care discussion.  Palliative care on board.    DVT prophylaxis: SCDs Start: 10/21/22 0604 Place and maintain sequential compression device Start: 10/02/22 1610   Code Status:     Code Status: Full Code  Disposition:  Uncertain at this time, pending clinical improvement.  Status is: Inpatient  Remains inpatient appropriate because: encephalopathy, pending clinical improvement.   Family Communication:  Spoke with [facility] Medina on [facility] phone and updated him about [facility] clinical condition of [facility] patient on 10/22/2022.  Consultants:  Neurosurgery Palliative care Neurology GI.  Procedures:  Lumbar puncture 10/02/2020. EEG 4/27.  Antimicrobials:  None at this time  Subjective:  Today, patient was seen and examined at bedside.  Patient denies any nausea, vomiting, fever, chills or rigor.  Has had rectal bleed yesterday but was noted to be impacted so smog enema was given with multiple episodes of bowel movement around 5-6 episodes.[name]  Appears to be little more alert awake today.  Objective: Vitals:   10/21/22 1530 10/21/22 1943 10/22/22 0559 10/22/22 0821  BP: 138/83 (!) 144/86 (!) 144/77 123/87  Pulse: 89 84 85 84  Resp: 15 16 18    Temp: (!) 97.4 F (36.3 C) 97.6 F (36.4 C) (!) 97.5 F (36.4 C) (!) 96.2 F (35.7 C)  TempSrc: Oral Oral Axillary Axillary  SpO2: 100% 100% 100% 100%  Weight:      Height:        Intake/Output Summary (Last 24 hours) at 10/22/2022 1029 Last data filed at 10/22/2022 0600 Gross per 24 hour  Intake 947.07 ml  Output 1350 ml  Net -402.93 ml    Filed Weights   10/04/22 1100  Weight: 102.1 kg    Physical Examination: Body mass index is 32.28  kg/m.   General: Alert awake and Communicative, mildly obese, following few commands, was able to verbalize some. HENT:   No scleral pallor or icterus noted. Oral mucosa is moist.  Chest:  Clear breath sounds.   No crackles or wheezes.  CVS: S1 &S2 heard. No murmur.  Regular rate and rhythm. Abdomen: Soft, nontender, nondistended.  Bowel sounds are heard.   Extremities: No cyanosis, clubbing or edema.  Peripheral pulses are palpable. Psych: Alert awake, mildly verbal, moves extremities. CNS: Nonverbal, noninteractive. Skin: Warm and dry.  No rashes noted.  Data Reviewed:   CBC: Recent Labs  Lab 10/17/22 0538 10/17/22 1419 10/20/22 0425 10/21/22 0230 10/21/22 0712 10/21/22 1212 10/21/22 1749 10/22/22 0047  WBC 11.0* 11.4* 10.4 9.8  --   --   --  10.2  HGB 12.4* 12.1* 11.5* 11.4* 11.3* 11.3* 11.7* 9.9*  HCT 34.8* 34.5* 33.4* 33.3* 35.4* 34.1* 34.9* 30.4*  MCV 79.8* 80.8 83.1 84.1  --   --   --  87.1  PLT 214 189 133* 113*  --   --   --  128*     Basic Metabolic Panel: Recent Labs  Lab 10/18/22 0738 10/19/22 0757 10/20/22 0425 10/20/22 1153 10/21/22 0230 10/22/22 0047  NA 143  --  140 144 144 142  K 3.2*  --  2.5* 2.6* 2.8* 4.1  CL 109  --  109 110 113* 112*  CO2 21*  --  20* 23 23 22   GLUCOSE 179*  --  345* 214* 340* 510*  BUN 41*  --  40* 36* 32* 28*  CREATININE 1.36*  --  1.42* 1.35* 1.20 1.20  CALCIUM 8.8*  --  7.9* 8.1* 7.9* 7.9*  MG 1.5* 1.8 1.8  --  1.9 1.8     Liver Function Tests: Recent Labs  Lab 10/16/22 0522 10/17/22 0538  AST 63* 48*  ALT 128* 130*  ALKPHOS 71 66  BILITOT 0.9 0.7  PROT 6.1* 5.7*  ALBUMIN 3.1* 2.9*      Radiology Studies: CT CHEST ABDOMEN PELVIS W CONTRAST  Result Date: 10/21/2022 CLINICAL DATA:  Occult malignancy Malignancy workup. EXAM: CT CHEST, ABDOMEN, AND PELVIS WITH CONTRAST TECHNIQUE: Multidetector CT imaging of [facility] chest, abdomen and pelvis was performed following [facility] standard protocol during bolus  administration of intravenous contrast. RADIATION DOSE REDUCTION: This exam was performed according to [facility] departmental dose-optimization program which includes automated exposure control, adjustment of the mA and/or kV according to patient size and/or use of iterative reconstruction technique. CONTRAST:  75mL OMNIPAQUE IOHEXOL 350 MG/ML SOLN COMPARISON:  CT dated October 13, 2022 FINDINGS: CT CHEST FINDINGS Cardiovascular: Heart is Jeremy upper limits of normal in size. No pericardial effusion. Three-vessel coronary artery atherosclerotic calcifications. Scattered atherosclerosis of Jeremy nonaneurysmal thoracic aorta. Mediastinum/Nodes: No axillary, mediastinal or hilar adenopathy. No visualized supraclavicular adenopathy. Visualized thyroid is unremarkable. Lungs/Pleura: No pleural effusion or pneumothorax. Patchy RIGHT basilar predominately platelike airspace opacities. No suspicious pulmonary masses or nodules. Musculoskeletal: Bilateral gynecomastia.  Remote rib fractures. CT ABDOMEN PELVIS FINDINGS Hepatobiliary: No suspicious focal hepatic mass. Gallbladder is unremarkable. No intrahepatic or extrahepatic biliary ductal dilation. Pancreas: Unremarkable. No pancreatic ductal dilatation or surrounding inflammatory changes. Spleen: Normal in size without focal abnormality. Adrenals/Urinary Tract: Adrenal glands are unremarkable. Kidneys enhance symmetrically. No suspicious focal renal mass. Nonobstructing RIGHT-sided nephrolithiasis measuring 9 mm. No hydronephrosis. No obstructing nephrolithiasis. Bladder is unremarkable. Stomach/Bowel: Large rectal stool ball. It measures 23 cm in craniocaudal extent. There is a small amount of fat stranding anterior to Jeremy sacrum with bowel wall prominence of Jeremy rectum. Moderate RIGHT-sided colonic stool burden. Appendix is normal. Stomach is unremarkable for degree decompression. Vascular/Lymphatic: No significant vascular findings are present. No enlarged abdominal or pelvic  lymph nodes. Reproductive: Prostate is present. Other: Small volume free fluid anterior to Jeremy sacrum.  No free air. Musculoskeletal: No acute or significant osseous findings. IMPRESSION: 1. No evidence of primary malignancy within Jeremy chest, abdomen or pelvis. 2. Large rectal stool ball measuring at least 23 cm in craniocaudal extent. There is a small amount of fat stranding anterior to Jeremy sacrum with bowel wall prominence. Findings could reflect stercoral colitis. 3. Patchy RIGHT basilar predominately platelike airspace opacities could reflect atelectasis or infection. Aortic Atherosclerosis (ICD10-I70.0). Electronically Signed   By: Meda Klinefelter M.D.   On: 10/21/2022 15:45      LOS: 20 days    Joycelyn Das, MD [facility] Available via Epic secure chat 7am-7pm After these hours, please refer to coverage provider listed on [email or web address] 10/22/2022, 10:29 AM
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
## 2022-10-23 DIAGNOSIS — G030 Nonpyogenic meningitis: Secondary | ICD-10-CM | POA: Diagnosis not present

## 2022-10-23 DIAGNOSIS — N179 Acute kidney failure, unspecified: Secondary | ICD-10-CM | POA: Diagnosis not present

## 2022-10-23 DIAGNOSIS — G9340 Encephalopathy, unspecified: Secondary | ICD-10-CM | POA: Diagnosis not present

## 2022-10-23 DIAGNOSIS — F419 Anxiety disorder, unspecified: Secondary | ICD-10-CM | POA: Diagnosis not present

## 2022-10-23 DIAGNOSIS — R4182 Altered mental status, unspecified: Secondary | ICD-10-CM | POA: Diagnosis not present

## 2022-10-23 LAB — GLUCOSE, CAPILLARY
Glucose-Capillary: 131 mg/dL — ABNORMAL HIGH (ref 70–99)
Glucose-Capillary: 162 mg/dL — ABNORMAL HIGH (ref 70–99)
Glucose-Capillary: 66 mg/dL — ABNORMAL LOW (ref 70–99)
Glucose-Capillary: 79 mg/dL (ref 70–99)
Glucose-Capillary: 122 mg/dL — ABNORMAL HIGH (ref 70–99)
Glucose-Capillary: 152 mg/dL — ABNORMAL HIGH (ref 70–99)
Glucose-Capillary: 229 mg/dL — ABNORMAL HIGH (ref 70–99)

## 2022-10-23 LAB — CBC
HCT: 32.5 % — ABNORMAL LOW (ref 39.0–52.0)
Hemoglobin: 10.9 g/dL — ABNORMAL LOW (ref 13.0–17.0)
MCH: 28.8 pg (ref 26.0–34.0)
MCHC: 33.5 g/dL (ref 30.0–36.0)
MCV: 86 fL (ref 80.0–100.0)
Platelets: 172 10*3/uL (ref 150–400)
RBC: 3.78 MIL/uL — ABNORMAL LOW (ref 4.22–5.81)
RDW: 16 % — ABNORMAL HIGH (ref 11.5–15.5)
WBC: 13 10*3/uL — ABNORMAL HIGH (ref 4.0–10.5)
nRBC: 0 % (ref 0.0–0.2)

## 2022-10-23 LAB — BASIC METABOLIC PANEL
Anion gap: 12 (ref 5–15)
BUN: 19 mg/dL (ref 8–23)
CO2: 23 mmol/L (ref 22–32)
Calcium: 8.1 mg/dL — ABNORMAL LOW (ref 8.9–10.3)
Chloride: 110 mmol/L (ref 98–111)
Creatinine, Ser: 0.97 mg/dL (ref 0.61–1.24)
GFR, Estimated: >60 mL/min (ref >60)
Glucose, Bld: 240 mg/dL — ABNORMAL HIGH (ref 70–99)
Potassium: 3 mmol/L — ABNORMAL LOW (ref 3.5–5.1)
Sodium: 145 mmol/L (ref 135–145)

## 2022-10-23 MED ORDER — POLYETHYLENE GLYCOL 3350 17 G PO PACK
17.0000 g | PACK | Freq: Two times a day (BID) | ORAL | Status: DC
  Administered 2022-10-23 – 2022-10-29 (×11): 17 g via ORAL
  Filled 2022-10-23 (×13): qty 1

## 2022-10-23 MED ORDER — POTASSIUM CHLORIDE 10 MEQ/100ML IV SOLN
10.0000 meq | INTRAVENOUS | Status: AC
  Administered 2022-10-23 (×6): 10 meq via INTRAVENOUS
  Filled 2022-10-23 (×6): qty 100

## 2022-10-23 MED ORDER — GLUCOSE 40 % PO GEL: 1.0000 | ORAL | Status: AC

## 2022-10-23 NOTE — Inpatient Diabetes Management (Signed)
Inpatient Diabetes Program Recommendations  AACE/ADA: New Consensus Statement on Inpatient Glycemic Control (2015)  Target Ranges:  Prepandial:   less than 140 mg/dL      Peak postprandial:   less than 180 mg/dL (1-2 hours)      Critically ill patients:  140 - 180 mg/dL   Lab Results  Component Value Date   GLUCAP 131 (H) 10/23/2022   HGBA1C 9.7 (H) 09/13/2022    Review of Glycemic Control  Latest Reference Range & Units 10/22/22 08:25 10/22/22 12:51 10/22/22 14:14 10/22/22 17:09 10/22/22 20:50 10/23/22 07:23  Glucose-Capillary 70 - 99 mg/dL 161 (H) 096 (H) 045 (H) 254 (H) 183 (H) 131 (H)   rapidly progressing dementia, 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 EEG to evaluate for seizure  Diabetes history: DM 2 Outpatient Diabetes medications: 70/30 0-20 units tid Current orders for Inpatient glycemic control:  Semglee 30 units Daily Novolog 0-15 units tid + hs  Glucerna tid with meals  Inpatient Diabetes Program Recommendations:    Glucose trends increase at night time.  -   Consider increasing frequency of Novolog Correction to Q4 hours.  Thanks,  Christena Deem RN, MSN, BC-ADM Inpatient Diabetes Coordinator Team Pager [phone] (8a-5p)

## 2022-10-23 NOTE — Progress Notes (Signed)
2230- Nurse tech witnessed pt projectile vomiting upon entering room. PRN IV zofran administered. FSBG currently at 229. Pt cleaned up, linen changed, HOB greater than 30 due to risk of aspiration.

## 2022-10-23 NOTE — Progress Notes (Signed)
Occupational Therapy Treatment Patient Details Name: Jeremy Medina. MRN: [id number] DOB: [birth year] Today's Date: 10/23/2022   History of present illness Jeremy Medina. is a 72 y.o. male who presents complaining of altered mental status and progressive decline.  MRI performed at [facility], concern for hydrocephalus and neurosurgeon recommended pt present to ED. Work-up pending. LP 10/03/2022. PMHx: type 2 diabetes, PTSD, chronic pain on methadone per son, hypertension, hyperlipidemia   OT comments  Patient demonstrated gain with self feeding with holding cup for drinking but requires HOH to bring to mouth and support to prevent spillage. Difficulty holding spoon and requires HOH assist. Able to wipe face with wash cloth but difficulty washing hands. Patient will benefit from continued inpatient follow up therapy, <3 hours/day to address following directions, grooming, and functional transfers.    Recommendations for follow up therapy are one component of a multi-disciplinary discharge planning process, led by the attending physician.  Recommendations may be updated based on patient status, additional functional criteria and insurance authorization.    Assistance Recommended at Discharge Frequent or constant Supervision/Assistance  Patient can return home with the following  A lot of help with walking and/or transfers;Two people to help with walking and/or transfers;A lot of help with bathing/dressing/bathroom;Two people to help with bathing/dressing/bathroom;Direct supervision/assist for medications management;Direct supervision/assist for financial management;Assist for transportation;Help with stairs or ramp for entrance;Assistance with feeding;Assistance with cooking/housework   Equipment Recommendations  None recommended by OT    Recommendations for Other Services      Precautions / Restrictions Precautions Precautions: Fall Precaution Comments: posterior lean Restrictions Weight  Bearing Restrictions: No       Mobility Bed Mobility Overal bed mobility: Needs Assistance Bed Mobility: Supine to Sit Rolling: Max assist Sidelying to sit: Max assist       General bed mobility comments: instruction with verbal and tactile cues and limited initiation from patient    Transfers Overall transfer level: Needs assistance Equipment used: 1 person hand held assist Transfers: Sit to/from Stand, Bed to chair/wheelchair/BSC Sit to Stand: Max assist Stand pivot transfers: Max assist         General transfer comment: Patient demonstrating strong posterior leaning and unable to use RW for transfer     Balance Overall balance assessment: Needs assistance Sitting-balance support: Feet supported, Bilateral upper extremity supported Sitting balance-Leahy Scale: Poor Sitting balance - Comments: strong posterior and right bias Postural control: Posterior lean, Right lateral lean     Standing balance comment: stood for transfer                           ADL either performed or assessed with clinical judgement   ADL Overall ADL's : Needs assistance/impaired Eating/Feeding: Maximal assistance;Sitting Eating/Feeding Details (indicate cue type and reason): able to hold drink with assistance to avoid spilling, requires HOH to bring to mouth, difficulty holding onto spoon, liquid diet making avoiding spilliage difficult Grooming: Wash/dry hands;Wash/dry face;Moderate assistance;Sitting Grooming Details (indicate cue type and reason): verbal and tactile cues to initiate and mod assist to complete                               General ADL Comments: difficulty following directions with self care    Extremity/Trunk Assessment              Vision       Perception     Praxis  Cognition Arousal/Alertness: Awake/alert Behavior During Therapy: Flat affect Overall Cognitive Status: Difficult to assess Area of Impairment: Problem solving,  Awareness, Safety/judgement, Following commands, Attention                 Orientation Level: Situation, Time, Place Current Attention Level: Selective Memory: Decreased recall of precautions, Decreased short-term memory Following Commands: Follows one step commands inconsistently, Follows one step commands with increased time Safety/Judgement: Decreased awareness of safety, Decreased awareness of deficits Awareness: Intellectual Problem Solving: Slow processing, Requires verbal cues, Requires tactile cues General Comments: limited vocalization, continues to have difficulty following one step commands        Exercises      Shoulder Instructions       General Comments VSS on RA    Pertinent Vitals/ Pain       Pain Assessment Pain Assessment: Faces Faces Pain Scale: Hurts little more Pain Location: grimacing with movmenet during bed mobility Pain Descriptors / Indicators: Grimacing Pain Intervention(s): Limited activity within patient's tolerance, Monitored during session, Repositioned  Home Living                                          Prior Functioning/Environment              Frequency  Min 1X/week        Progress Toward Goals  OT Goals(current goals can now be found in the care plan section)  Progress towards OT goals: Progressing toward goals  Acute Rehab OT Goals OT Goal Formulation: Patient unable to participate in goal setting Time For Goal Achievement: 10/27/22 Potential to Achieve Goals: Good ADL Goals Pt Will Perform Grooming: with max assist;sitting Pt Will Perform Upper Body Dressing: with max assist;standing Pt Will Transfer to Toilet: with max assist;stand pivot transfer;bedside commode Additional ADL Goal #1: Pt will follow 1 step commands 50% of the time to complete ADL task  Plan Discharge plan remains appropriate    Co-evaluation                 AM-PAC OT "6 Clicks" Daily Activity     Outcome  Measure   Help from another person eating meals?: A Lot Help from another person taking care of personal grooming?: A Lot Help from another person toileting, which includes using toliet, bedpan, or urinal?: A Lot Help from another person bathing (including washing, rinsing, drying)?: A Lot Help from another person to put on and taking off regular upper body clothing?: A Lot Help from another person to put on and taking off regular lower body clothing?: A Lot 6 Click Score: 12    End of Session Equipment Utilized During Treatment: Gait belt  OT Visit Diagnosis: Unsteadiness on feet (R26.81);Other abnormalities of gait and mobility (R26.89);Muscle weakness (generalized) (M62.81);Pain   Activity Tolerance Patient tolerated treatment well   Patient Left in chair;with call bell/phone within reach;with chair alarm set   Nurse Communication Mobility status;Need for lift equipment        Time: 0726-0756 OT Time Calculation (min): 30 min  Charges: OT General Charges $OT Visit: 1 Visit OT Treatments $Self Care/Home Management : 23-37 mins  Alfonse Flavors, OTA Acute Rehabilitation Services  Office [phone]   Dewain Penning 10/23/2022, 8:56 AM
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
## 2022-10-23 NOTE — Progress Notes (Addendum)
PROGRESS NOTE    Jeremy Medina.  RUE:[id number] DOB: 1951/04/30 DOA: 09/28/2022 PCP: [facility]    Brief Narrative:   Jeremy Medina. is a 72 y.o. 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  He was discharged home with neurology and neurosurgery follow-up.  This time, patient had been neurosurgery clinic and was sent to [facility] ER for further evaluation and LP for encephalopathy..    Sequence of events as below.      4/12: Admitted 4/14: Cr normalized, persistent encephalopathy noted 4/15: Neurology consulted 4/17: LP obtained 4/18: ID consulted, amphotericin started 4/19: Patient more alert 4/20-4/24--intermittent waxing waning mentation 4/25 immediately post LP more lucid-conversant short sentences---Fungal meningitis Etiology now questioned--LP shows 29 WBC on champagne tap---Broadened Abx coverage 4/26.  Broad coverage was discontinued by ID-still awaiting labs-long-term EEG planned for tonight  4/27 EEG grossly negative, amphotericin B stopped, IV fluids reinitiated 2/2 AKI, IV steroids Solu-Medrol started, working up autoimmune encephalitis and getting ANA and other autoimmune panel 4/28 CT chest abdomen pelvis negative for any gross pathology other than potentially some mild proctitis which may be an overcall 4/30-neurosurgery consulted-not a candidate for shunt placement based on discussions with them, palliative care consulted for goals of care 5/5-mention of red blood per rectum GI was consulted 5/6-neurology following and considering neurosurgery evaluation for possible meningeal biopsy    Assessment and Plan:  Progressive subacute encephalopathy Progressive encephalopathy since January,2024.  Patient was admitted from neurosurgery office for concerns of mental status changes. CT head and  MRV showed no acute process.   No improvement with IV thiamine.  Initial LP obtained that showed elevated protein and WBC.  No substantial improvement with initial LP, Neurology suspected fungal meningitis.  LP performed again 4/25 showed 34 cm water opening pressure.   Patient was initially on Amphotericin which was discontinued. ESR 45, CRP less than 0.5 rheumatoid factor, ANA are negative.  On 4/25, lumbar puncture was done and was noted to have encephalitis panel negative, CSF Gram stain/ fungal CSF negative.   CT scan of [facility] chest abdomen pelvis was negative.  Neurosurgery indicated that [facility] patient is a poor candidate for shunt placement.  Neurology has reassessed [facility] patient on 10/18/2022 with concern for CJD.  CSF fluid analysis has been sent.  CT scan of [facility] abdomen chest and pelvis with contrast to rule out occult malignancy was performed on 10/21/2022 with no evidence of primary malignancy but large rectal soft bowel around 23 cm.  At this time neurology has again reviewed [facility] situation and considering neurosurgical evaluation or a possible leptomeningeal biopsy.  Spoke with neurology about it..  Bright red blood per rectum/constipation Possibly secondary to stercoral ulcer.   Hemoglobin of 10.9 today from 9.9 yesterday.  Hemoglobin was 12.1, five days back.  I was consulted and patient received SMOG enema with multiple bowel movements without any blood.  Continue bowel regimen to ensure no constipation.  GI recommends adding MiraLAX twice a day.  No plans for colonoscopy.  No further episodes noted.  GI has signed off at this time.  Hypokalemia;  potassium low at 3.0 today.  .  Will replenish with IV KCl 60 mEq and p.o. potassium twice daily.  Check levels in AM.  Hypomagnesemia.  Latest magnesium of 1.8.  Acute kidney injury Latest creatinine of 0.9.  Chronic pain syndrome Continue methadone 10 mg 3 times daily   Transaminitis Likely NASH given normal [place].  Check CMP in AM.   Diabetes  mellitus type 2, with hyperglycemia.   On 70/30 insulin at home.  Currently on Lantus and sliding scale insulin.  Latest POC glucose 162.  Benign essential hypertension As needed hydralazine.  Lisinopril on hold.  Continue amlodipine.  Obesity (BMI 30-39.9) BMI 32.  Might benefit from weight loss as outpatient.   Folate deficiency   Continue folate supplement.   Anxiety Was on Ativan which was discontinued on 4/28.   Goals of care discussion.  Palliative care on board.    DVT prophylaxis: SCDs Start: 10/21/22 0604 Place and maintain sequential compression device Start: 10/02/22 1610   Code Status:     Code Status: Full Code  Disposition:  Pending clinical improvement.  Uncertain at this time.  Status is: Inpatient  Remains inpatient appropriate because: encephalopathy, pending clinical improvement.   Family Communication:  Spoke with [facility] Medina on [facility] phone and updated him about [facility] clinical condition of [facility] patient on 10/22/2022.  Consultants:  Neurosurgery Palliative care Neurology GI.  Procedures:  Lumbar puncture 10/02/2020. EEG 4/27.  Antimicrobials:  None at this time  Subjective:  Today, patient was seen and examined at bedside.  Patient is little more alert awake.  Mildly Communicative.  No mention of bright red bleeding.  Objective: Vitals:   10/22/22 1720 10/22/22 2052 10/23/22 0412 10/23/22 0723  BP: 113/70 (!) 141/69 (!) 149/75 (!) 143/79  Pulse: 96 94 86 87  Resp: 16 16 16 18   Temp: 98.3 F (36.8 C) 98.3 F (36.8 C) 98.5 F (36.9 C) (!) 97.5 F (36.4 C)  TempSrc: Oral Oral Oral Oral  SpO2: 96% 96% 99% 100%  Weight:      Height:        Intake/Output Summary (Last 24 hours) at 10/23/2022 1246 Last data filed at 10/23/2022 0947 Gross per 24 hour  Intake 3 ml  Output 1350 ml  Net -1347 ml    Filed Weights   10/04/22 1100  Weight: 102.1 kg    Physical Examination: Body mass index is 32.28 kg/m.   General: Alert and awake,  mildly verbal, follows few commands,  HENT:   No scleral pallor or icterus noted. Oral mucosa is moist.  Chest:  Clear breath sounds.   No crackles or wheezes.  CVS: S1 &S2 heard. No murmur.  Regular rate and rhythm. Abdomen: Soft, nontender, nondistended.  Bowel sounds are heard.   Extremities: No cyanosis, clubbing or edema.  Peripheral pulses are palpable. Psych: Alert awake, mildly verbal, squeezing hands, disoriented. CNS: Mildly verbal follows few commands, Skin: Warm and dry.  No rashes noted.  Data Reviewed:   CBC: Recent Labs  Lab 10/17/22 1419 10/20/22 0425 10/21/22 0230 10/21/22 9604 10/21/22 1212 10/21/22 1749 10/22/22 0047 10/23/22 0846  WBC 11.4* 10.4 9.8  --   --   --  10.2 13.0*  HGB 12.1* 11.5* 11.4* 11.3* 11.3* 11.7* 9.9* 10.9*  HCT 34.5* 33.4* 33.3* 35.4* 34.1* 34.9* 30.4* 32.5*  MCV 80.8 83.1 84.1  --   --   --  87.1 86.0  PLT 189 133* 113*  --   --   --  128* 172     Basic Metabolic Panel: Recent Labs  Lab 10/18/22 0738 10/19/22 0757 10/20/22 0425 10/20/22 1153 10/21/22 0230 10/22/22 0047 10/23/22 0846  NA 143  --  140 144 144 142 145  K 3.2*  --  2.5* 2.6* 2.8* 4.1 3.0*  CL 109  --  109 110 113* 112* 110  CO2 21*  --  20* 23 23 22 23   GLUCOSE 179*  --  345* 214* 340* 510* 240*  BUN 41*  --  40* 36* 32* 28* 19  CREATININE 1.36*  --  1.42* 1.35* 1.20 1.20 0.97  CALCIUM 8.8*  --  7.9* 8.1* 7.9* 7.9* 8.1*  MG 1.5* 1.8 1.8  --  1.9 1.8  --      Liver Function Tests: Recent Labs  Lab 10/17/22 0538  AST 48*  ALT 130*  ALKPHOS 66  BILITOT 0.7  PROT 5.7*  ALBUMIN 2.9*      Radiology Studies: CT CHEST ABDOMEN PELVIS W CONTRAST  Result Date: 10/21/2022 CLINICAL DATA:  Occult malignancy Malignancy workup. EXAM: CT CHEST, ABDOMEN, AND PELVIS WITH CONTRAST TECHNIQUE: Multidetector CT imaging of [facility] chest, abdomen and pelvis was performed following [facility] standard protocol during bolus administration of intravenous contrast. RADIATION DOSE  REDUCTION: This exam was performed according to [facility] departmental dose-optimization program which includes automated exposure control, adjustment of the mA and/or kV according to patient size and/or use of iterative reconstruction technique. CONTRAST:  75mL OMNIPAQUE IOHEXOL 350 MG/ML SOLN COMPARISON:  CT dated October 13, 2022 FINDINGS: CT CHEST FINDINGS Cardiovascular: Heart is [facility] upper limits of normal in size. No pericardial effusion. Three-vessel coronary artery atherosclerotic calcifications. Scattered atherosclerosis of Jeremy nonaneurysmal thoracic aorta. Mediastinum/Nodes: No axillary, mediastinal or hilar adenopathy. No visualized supraclavicular adenopathy. Visualized thyroid is unremarkable. Lungs/Pleura: No pleural effusion or pneumothorax. Patchy RIGHT basilar predominately platelike airspace opacities. No suspicious pulmonary masses or nodules. Musculoskeletal: Bilateral gynecomastia.  Remote rib fractures. CT ABDOMEN PELVIS FINDINGS Hepatobiliary: No suspicious focal hepatic mass. Gallbladder is unremarkable. No intrahepatic or extrahepatic biliary ductal dilation. Pancreas: Unremarkable. No pancreatic ductal dilatation or surrounding inflammatory changes. Spleen: Normal in size without focal abnormality. Adrenals/Urinary Tract: Adrenal glands are unremarkable. Kidneys enhance symmetrically. No suspicious focal renal mass. Nonobstructing RIGHT-sided nephrolithiasis measuring 9 mm. No hydronephrosis. No obstructing nephrolithiasis. Bladder is unremarkable. Stomach/Bowel: Large rectal stool ball. It measures 23 cm in craniocaudal extent. There is a small amount of fat stranding anterior to Jeremy sacrum with bowel wall prominence of Jeremy rectum. Moderate RIGHT-sided colonic stool burden. Appendix is normal. Stomach is unremarkable for degree decompression. Vascular/Lymphatic: No significant vascular findings are present. No enlarged abdominal or pelvic lymph nodes. Reproductive: Prostate is present. Other:  Small volume free fluid anterior to Jeremy sacrum.  No free air. Musculoskeletal: No acute or significant osseous findings. IMPRESSION: 1. No evidence of primary malignancy within Jeremy chest, abdomen or pelvis. 2. Large rectal stool ball measuring at least 23 cm in craniocaudal extent. There is a small amount of fat stranding anterior to Jeremy sacrum with bowel wall prominence. Findings could reflect stercoral colitis. 3. Patchy RIGHT basilar predominately platelike airspace opacities could reflect atelectasis or infection. Aortic Atherosclerosis (ICD10-I70.0). Electronically Signed   By: Meda Klinefelter M.D.   On: 10/21/2022 15:45      LOS: 21 days    Joycelyn Das, MD [facility] Available via Epic secure chat 7am-7pm After these hours, please refer to coverage provider listed on [email or web address] 10/23/2022, 12:46 PM

## 2022-10-24 ENCOUNTER — Other Ambulatory Visit: Payer: Self-pay | Admitting: Neurosurgery

## 2022-10-24 ENCOUNTER — Inpatient Hospital Stay (HOSPITAL_COMMUNITY): Payer: No Typology Code available for payment source

## 2022-10-24 DIAGNOSIS — R933 Abnormal findings on diagnostic imaging of other parts of digestive tract: Secondary | ICD-10-CM

## 2022-10-24 DIAGNOSIS — K5641 Fecal impaction: Secondary | ICD-10-CM

## 2022-10-24 DIAGNOSIS — N179 Acute kidney failure, unspecified: Secondary | ICD-10-CM | POA: Diagnosis not present

## 2022-10-24 DIAGNOSIS — E538 Deficiency of other specified B group vitamins: Secondary | ICD-10-CM | POA: Diagnosis not present

## 2022-10-24 DIAGNOSIS — E119 Type 2 diabetes mellitus without complications: Secondary | ICD-10-CM | POA: Diagnosis not present

## 2022-10-24 DIAGNOSIS — G030 Nonpyogenic meningitis: Secondary | ICD-10-CM | POA: Diagnosis not present

## 2022-10-24 LAB — BASIC METABOLIC PANEL
Anion gap: 7 (ref 5–15)
BUN: 15 mg/dL (ref 8–23)
CO2: 25 mmol/L (ref 22–32)
Calcium: 8 mg/dL — ABNORMAL LOW (ref 8.9–10.3)
Chloride: 106 mmol/L (ref 98–111)
Creatinine, Ser: 1 mg/dL (ref 0.61–1.24)
GFR, Estimated: >60 mL/min (ref >60)
Glucose, Bld: 322 mg/dL — ABNORMAL HIGH (ref 70–99)
Potassium: 3.7 mmol/L (ref 3.5–5.1)
Sodium: 138 mmol/L (ref 135–145)

## 2022-10-24 LAB — GLUCOSE, CAPILLARY
Glucose-Capillary: 181 mg/dL — ABNORMAL HIGH (ref 70–99)
Glucose-Capillary: 214 mg/dL — ABNORMAL HIGH (ref 70–99)
Glucose-Capillary: 237 mg/dL — ABNORMAL HIGH (ref 70–99)

## 2022-10-24 LAB — CBC
HCT: 29.1 % — ABNORMAL LOW (ref 39.0–52.0)
Hemoglobin: 9.8 g/dL — ABNORMAL LOW (ref 13.0–17.0)
MCH: 28.8 pg (ref 26.0–34.0)
MCHC: 33.7 g/dL (ref 30.0–36.0)
MCV: 85.6 fL (ref 80.0–100.0)
Platelets: 156 10*3/uL (ref 150–400)
RBC: 3.4 MIL/uL — ABNORMAL LOW (ref 4.22–5.81)
RDW: 15.9 % — ABNORMAL HIGH (ref 11.5–15.5)
WBC: 13.2 10*3/uL — ABNORMAL HIGH (ref 4.0–10.5)
nRBC: 0 % (ref 0.0–0.2)

## 2022-10-24 LAB — CULTURE, FUNGUS WITHOUT SMEAR

## 2022-10-24 LAB — MISC LABCORP TEST (SEND OUT): Labcorp test code: 9985

## 2022-10-24 LAB — MAGNESIUM: Magnesium: 1.6 mg/dL — ABNORMAL LOW (ref 1.7–2.4)

## 2022-10-24 MED ORDER — MAGNESIUM SULFATE 2 GM/50ML IV SOLN
2.0000 g | Freq: Once | INTRAVENOUS | Status: AC
  Administered 2022-10-24: 2 g via INTRAVENOUS
  Filled 2022-10-24: qty 50

## 2022-10-24 MED ORDER — INSULIN ASPART 100 UNIT/ML IJ SOLN
4.0000 [IU] | Freq: Three times a day (TID) | INTRAMUSCULAR | Status: DC
  Administered 2022-10-24 – 2022-10-25 (×2): 4 [IU] via SUBCUTANEOUS

## 2022-10-24 NOTE — Progress Notes (Signed)
PROGRESS NOTE  Jeremy Medina. ZOX:[id number] DOB: May 15, 1951   PCP: [facility]  Patient is from: Home  DOA: 09/28/2022 LOS: 22  Chief complaints Chief Complaint  Patient presents with   Weakness    Falls     Brief Narrative / Interim history: 72 year old M with PMH of DM-2, HTN, pancreatic insufficiency, GERD, chronic pain on methadone, PTSD and anxiety sent to ED from neurosurgery clinic for progressive subacute encephalopathy on 4/12.  Patient was recently hospitalized at the [facility] for subacute encephalopathy 3 months back and was thought to have NPH.  He was discharged home with neurology and neurosurgery follow-up.  CT head and MRV without acute finding.  Neurology consulted.  No improvement with IV thiamine.  Underwent LP on 4/17.  There was concern about fungal meningitis.  ID consulted and he was started on Amphotericin.  There was some improvement in mentation.  Had repeat LP on 4/25 that showed 34 cm water opening pressure.  CSF study unrevealing.  He also had CT chest, abdomen and pelvis that was negative for malignancy.  Neurosurgery indicated that patient is poor candidate for shunt placement on 4/30.  Neurology has reassessed patient on 5/2 with concern for CJD.  CSF fluid analysis has been sent.  At this time, neurology recommending brain biopsy.     Subjective: Seen and examined earlier this morning.  No major events overnight of this morning.  He is awake and alert but not interactive or verbalizing.  He follows commands.  Does not appear to be in distress.  Objective: Vitals:   10/24/22 0043 10/24/22 0329 10/24/22 0803 10/24/22 1033  BP: 139/75 (!) 143/88 (!) 156/87 139/84  Pulse: 88 88 87 73  Resp:  18 18   Temp:  97.6 F (36.4 C) 97.8 F (36.6 C)   TempSrc:  Oral    SpO2: 99% 97% 97%   Weight:      Height:        Examination:  GENERAL: No apparent distress.  Nontoxic. HEENT: MMM.  Vision and hearing grossly intact.  NECK: Supple.  No  apparent JVD.  RESP:  No IWOB.  Fair aeration bilaterally. CVS:  RRR. Heart sounds normal.  ABD/GI/GU: BS+. Abd soft, NTND.  MSK/EXT:  Moves extremities. No apparent deformity. No edema.  SKIN: no apparent skin lesion or wound NEURO: Awake and alert but not verbal.  Follows commands.  Tracking with both eyes.  No apparent focal neuro deficit. PSYCH: Calm. Normal affect.   Procedures:  4/17 LP  4/25 LP   Assessment and plan: Principal Problem:   Suspected fungal meningitis Active Problems:   Progressive subacute encephalopathy   Chronic pain syndrome   Depression   Anxiety   Benign essential hypertension   Diabetes mellitus type 2, controlled, without complications (HCC)   Hyperlipidemia   Acute kidney injury (HCC)   Folate deficiency   Transaminitis   Obesity (BMI 30-39.9)   Hypokalemia   Dysphagia   Vomiting   Altered mental status   Fecal impaction (HCC)   Abnormal CT scan, colon  Progressive subacute encephalopathy: Unclear etiology despite extensive workup.  The probably did not improve with high-dose thiamine.  Some concern about fungal meningitis at some point but felt to be less likely.  Currently awake and alert but not verbal.  Follows some commands. 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  H&H relatively stable Recent Labs  10/17/22 0538 10/17/22 1419 10/20/22 0425 10/21/22 0230 10/21/22 0712 10/21/22 1212 10/21/22 1749 10/22/22 0047 10/23/22 0846 10/24/22 0326  HGB 12.4* 12.1* 11.5* 11.4* 11.3* 11.3* 11.7* 9.9* 10.9* 9.8*  -Continue monitoring -Continue folic acid supplementation -No indication or plan for colonoscopy per GI  Uncontrolled IDDM-2 with hyperglycemia: A1c 9.7% on 3/28.  On 70/30 insulin at home. Recent Labs  Lab 10/23/22 1818 10/23/22 2013 10/23/22 2236 10/24/22 0802 10/24/22 1242  GLUCAP 122* 152* 229* 237*  214*  -Continues on medically 30 units daily -Continue SSI-moderate -Add NovoLog 4 units 3 times daily with meals -Further adjustment as appropriate  Hypokalemia/hypomagnesemia -Monitor replenish as appropriate   Chronic pain syndrome -Continue methadone 10 mg 3 times daily   Transaminitis: Likely NASH given normal [place].     Fecal impaction/constipation:  -Continue bowel regimen   Benign essential hypertension -Continue amlodipine -Hydralazine as needed   Obesity (BMI 30-39.9) BMI 32.  Might benefit from weight loss as outpatient.   Folate deficiency  -Continue folate supplement.   Anxiety: Stable.   Goals of care discussion.  Palliative care on board.   Pancreatic insufficiency -Continue on Creon  Obesity Body mass index is 32.28 kg/m.           DVT prophylaxis:  SCDs Start: 10/21/22 0604 Place and maintain sequential compression device Start: 10/02/22 1610  Code Status: Full code Family Communication: None at bedside Level of care: Med-Surg Status is: Inpatient Remains inpatient appropriate because: Due to encephalopathy   Final disposition: TBD Consultants:  Neurology Neurosurgery Infectious disease  35 minutes with more than 50% spent in reviewing records, counseling patient/family and coordinating care.   Sch Meds:  Scheduled Meds:  amLODipine  10 mg Oral Daily   dextrose  1 Tube Oral STAT   feeding supplement (GLUCERNA SHAKE)  237 mL Oral TID BM   folic acid  1 mg Oral Daily   insulin aspart  0-15 Units Subcutaneous TID WC   insulin aspart  0-5 Units Subcutaneous QHS   insulin glargine-yfgn  30 Units Subcutaneous Daily   lidocaine (PF)  5 mL Other Once   lipase/protease/amylase  36,000 Units Oral TID AC   methadone  10 mg Oral TID   pantoprazole (PROTONIX) IV  40 mg Intravenous Q12H   polyethylene glycol  17 g Oral BID   potassium chloride  40 mEq Oral BID   senna-docusate  1 tablet Oral QHS   sodium chloride flush  3 mL Intravenous  Q12H   Continuous Infusions: PRN Meds:.acetaminophen, acetaminophen, ondansetron **OR** ondansetron (ZOFRAN) IV  Antimicrobials: Anti-infectives (From admission, onward)    Start     Dose/Rate Route Frequency Ordered Stop   10/12/22 0700  vancomycin (VANCOREADY) IVPB 750 mg/150 mL  Status:  Discontinued        750 mg 150 mL/hr over 60 Minutes Intravenous Every 12 hours 10/11/22 1811 10/12/22 1011   10/11/22 2200  cefTRIAXone (ROCEPHIN) 2 g in sodium chloride 0.9 % 100 mL IVPB  Status:  Discontinued        2 g 200 mL/hr over 30 Minutes Intravenous Every 12 hours 10/11/22 1749 10/13/22 2008   10/11/22 2000  ampicillin (OMNIPEN) 2 g in sodium chloride 0.9 % 100 mL IVPB  Status:  Discontinued        2 g 300 mL/hr over 20 Minutes Intravenous Every 4 hours 10/11/22 1752 10/12/22 1011   10/11/22 1845  cefTRIAXone (ROCEPHIN) 2 g in sodium chloride 0.9 % 100 mL IVPB  Status:  Discontinued        2 g 200 mL/hr over 30 Minutes Intravenous Every 24 hours 10/11/22 1746 10/11/22 1749   10/11/22 1845  vancomycin (VANCOREADY) IVPB 1750 mg/350 mL        1,750 mg 175 mL/hr over 120 Minutes Intravenous  Once 10/11/22 1754 10/11/22 2333   10/04/22 1500  amphotericin B liposome (AMBISOME) 500 mg in dextrose 5 % 500 mL IVPB  Status:  Discontinued        500 mg 312.5 mL/hr over 120 Minutes Intravenous Every 24 hours 10/04/22 1159 10/13/22 0904        I have personally reviewed the following labs and images: CBC: Recent Labs  Lab 10/20/22 0425 10/21/22 0230 10/21/22 0712 10/21/22 1212 10/21/22 1749 10/22/22 0047 10/23/22 0846 10/24/22 0326  WBC 10.4 9.8  --   --   --  10.2 13.0* 13.2*  HGB 11.5* 11.4*   < > 11.3* 11.7* 9.9* 10.9* 9.8*  HCT 33.4* 33.3*   < > 34.1* 34.9* 30.4* 32.5* 29.1*  MCV 83.1 84.1  --   --   --  87.1 86.0 85.6  PLT 133* 113*  --   --   --  128* 172 156   < > = values in this interval not displayed.   BMP &GFR Recent Labs  Lab 10/19/22 0757 10/20/22 0425  10/20/22 1153 10/21/22 0230 10/22/22 0047 10/23/22 0846 10/24/22 0326  NA  --  140 144 144 142 145 138  K  --  2.5* 2.6* 2.8* 4.1 3.0* 3.7  CL  --  109 110 113* 112* 110 106  CO2  --  20* 23 23 22 23 25   GLUCOSE  --  345* 214* 340* 510* 240* 322*  BUN  --  40* 36* 32* 28* 19 15  CREATININE  --  1.42* 1.35* 1.20 1.20 0.97 1.00  CALCIUM  --  7.9* 8.1* 7.9* 7.9* 8.1* 8.0*  MG 1.8 1.8  --  1.9 1.8  --  1.6*   Estimated Creatinine Clearance: 82.3 mL/min (by C-G formula based on SCr of 1 mg/dL). Liver & Pancreas: No results for input(s): "AST", "ALT", "ALKPHOS", "BILITOT", "PROT", "ALBUMIN" in the last 168 hours. No results for input(s): "LIPASE", "AMYLASE" in the last 168 hours. No results for input(s): "AMMONIA" in the last 168 hours. Diabetic: No results for input(s): "HGBA1C" in the last 72 hours. Recent Labs  Lab 10/23/22 1704 10/23/22 1818 10/23/22 2013 10/23/22 2236 10/24/22 0802  GLUCAP 79 122* 152* 229* 237*   Cardiac Enzymes: No results for input(s): "CKTOTAL", "CKMB", "CKMBINDEX", "TROPONINI" in the last 168 hours. No results for input(s): "PROBNP" in the last 8760 hours. Coagulation Profile: No results for input(s): "INR", "PROTIME" in the last 168 hours. Thyroid Function Tests: No results for input(s): "TSH", "T4TOTAL", "FREET4", "T3FREE", "THYROIDAB" in the last 72 hours. Lipid Profile: No results for input(s): "CHOL", "HDL", "LDLCALC", "TRIG", "CHOLHDL", "LDLDIRECT" in the last 72 hours. Anemia Panel: No results for input(s): "VITAMINB12", "FOLATE", "FERRITIN", "TIBC", "IRON", "RETICCTPCT" in the last 72 hours. Urine analysis:    Component Value Date/Time   COLORURINE AMBER (A) 09/28/2022 1931   APPEARANCEUR HAZY (A) 09/28/2022 1931   LABSPEC 1.020 09/28/2022 1931   PHURINE 5.0 09/28/2022 1931   GLUCOSEU >=500 (A) 09/28/2022 1931   HGBUR LARGE (A) 09/28/2022 1931   BILIRUBINUR NEGATIVE 09/28/2022 1931   BILIRUBINUR negative 04/27/2019 1559   KETONESUR 5  (A) 09/28/2022 1931   PROTEINUR 30 (A) 09/28/2022 1931  UROBILINOGEN 0.2 04/27/2019 1559   UROBILINOGEN 0.2 03/16/2015 1501   NITRITE NEGATIVE 09/28/2022 1931   LEUKOCYTESUR NEGATIVE 09/28/2022 1931   Sepsis Labs: Invalid input(s): "PROCALCITONIN", "LACTICIDVEN"  Microbiology: No results found for this or any previous visit (from the past 240 hour(s)).  Radiology Studies: No results found.    Carmino Ocain T. Camry Theiss [facility]  If 7PM-7AM, please contact night-coverage [email or web address] 10/24/2022, 12:32 PM

## 2022-10-24 NOTE — Progress Notes (Signed)
      INFECTIOUS DISEASE   Date: 10/24/2022  Patient name: Jeremy Medina.  Medical record number: [id number]  Date of birth: 07/17/1950    If it does not did go for brain biopsy I would make sure that specimens are sent  #1 in separate sterile container for FUNGAL stain and cultures and AFB stain and cultures   AND   #2 A specimen that can be sent to pathology (this one can be put in formalin but not #1 since it will kill all of the organisms). That specimen can have stains and we can even consider sending out pCRS for ID agents. Workup for ID cause has been negative to date.   Paulette Blanch Dam 10/24/2022, 10:26 AM

## 2022-10-24 NOTE — Inpatient Diabetes Management (Signed)
Inpatient Diabetes Program Recommendations  AACE/ADA: New Consensus Statement on Inpatient Glycemic Control (2015)  Target Ranges:  Prepandial:   less than 140 mg/dL      Peak postprandial:   less than 180 mg/dL (1-2 hours)      Critically ill patients:  140 - 180 mg/dL   Lab Results  Component Value Date   GLUCAP 237 (H) 10/24/2022   HGBA1C 9.7 (H) 09/13/2022    Review of Glycemic Control  Latest Reference Range & Units 10/23/22 07:23 10/23/22 12:08 10/23/22 16:37 10/23/22 17:04 10/23/22 18:18 10/23/22 20:13 10/23/22 22:36 10/24/22 08:02  Glucose-Capillary 70 - 99 mg/dL 301 (H) 601 (H) 66 (L) 79 122 (H) 152 (H) 229 (H) 237 (H)    Diabetes history: DM 2 Outpatient Diabetes medications: 70/30 0-20 units tid Current orders for Inpatient glycemic control:  Semglee 30 units Daily Novolog 0-15 units tid + hs  Glucerna tid with meals  Inpatient Diabetes Program Recommendations:    Glucose trends increase at night time.  -   Consider adjusting Novolog Correction to 0-9 units Q4 hours.  Thanks,  Christena Deem RN, MSN, BC-ADM Inpatient Diabetes Coordinator Team Pager [phone] (8a-5p)

## 2022-10-24 NOTE — Progress Notes (Signed)
Patient was unable to complete SRS protocol due to persistent movement. Spoke with nurse and she stated the patient had no meds ordered to help patient complete exam.

## 2022-10-25 ENCOUNTER — Encounter (HOSPITAL_COMMUNITY): Payer: Self-pay | Admitting: Certified Registered Nurse Anesthetist

## 2022-10-25 ENCOUNTER — Other Ambulatory Visit: Payer: Self-pay | Admitting: Neurosurgery

## 2022-10-25 DIAGNOSIS — G9340 Encephalopathy, unspecified: Secondary | ICD-10-CM | POA: Diagnosis not present

## 2022-10-25 DIAGNOSIS — G030 Nonpyogenic meningitis: Secondary | ICD-10-CM | POA: Diagnosis not present

## 2022-10-25 DIAGNOSIS — Z515 Encounter for palliative care: Secondary | ICD-10-CM | POA: Diagnosis not present

## 2022-10-25 DIAGNOSIS — R4182 Altered mental status, unspecified: Secondary | ICD-10-CM | POA: Diagnosis not present

## 2022-10-25 DIAGNOSIS — E538 Deficiency of other specified B group vitamins: Secondary | ICD-10-CM | POA: Diagnosis not present

## 2022-10-25 DIAGNOSIS — E119 Type 2 diabetes mellitus without complications: Secondary | ICD-10-CM | POA: Diagnosis not present

## 2022-10-25 DIAGNOSIS — N179 Acute kidney failure, unspecified: Secondary | ICD-10-CM | POA: Diagnosis not present

## 2022-10-25 DIAGNOSIS — Z7189 Other specified counseling: Secondary | ICD-10-CM | POA: Diagnosis not present

## 2022-10-25 LAB — CBC
HCT: 34.2 % — ABNORMAL LOW (ref 39.0–52.0)
Hemoglobin: 11.5 g/dL — ABNORMAL LOW (ref 13.0–17.0)
MCH: 28.3 pg (ref 26.0–34.0)
MCHC: 33.6 g/dL (ref 30.0–36.0)
MCV: 84.2 fL (ref 80.0–100.0)
Platelets: UNDETERMINED 10*3/uL (ref 150–400)
RBC: 4.06 MIL/uL — ABNORMAL LOW (ref 4.22–5.81)
RDW: 15.6 % — ABNORMAL HIGH (ref 11.5–15.5)
WBC: 13.4 10*3/uL — ABNORMAL HIGH (ref 4.0–10.5)
nRBC: 0.1 % (ref 0.0–0.2)

## 2022-10-25 LAB — COMPREHENSIVE METABOLIC PANEL
ALT: 177 U/L — ABNORMAL HIGH (ref 0–44)
AST: 35 U/L (ref 15–41)
Albumin: 2.7 g/dL — ABNORMAL LOW (ref 3.5–5.0)
Alkaline Phosphatase: 96 U/L (ref 38–126)
Anion gap: 10 (ref 5–15)
BUN: 12 mg/dL (ref 8–23)
CO2: 26 mmol/L (ref 22–32)
Calcium: 8.4 mg/dL — ABNORMAL LOW (ref 8.9–10.3)
Chloride: 104 mmol/L (ref 98–111)
Creatinine, Ser: 0.88 mg/dL (ref 0.61–1.24)
GFR, Estimated: >60 mL/min (ref >60)
Glucose, Bld: 238 mg/dL — ABNORMAL HIGH (ref 70–99)
Potassium: 3.2 mmol/L — ABNORMAL LOW (ref 3.5–5.1)
Sodium: 140 mmol/L (ref 135–145)
Total Bilirubin: 0.7 mg/dL (ref 0.3–1.2)
Total Protein: 5.4 g/dL — ABNORMAL LOW (ref 6.5–8.1)

## 2022-10-25 LAB — GLUCOSE, CAPILLARY
Glucose-Capillary: 143 mg/dL — ABNORMAL HIGH (ref 70–99)
Glucose-Capillary: 240 mg/dL — ABNORMAL HIGH (ref 70–99)
Glucose-Capillary: 120 mg/dL — ABNORMAL HIGH (ref 70–99)
Glucose-Capillary: 96 mg/dL (ref 70–99)
Glucose-Capillary: 323 mg/dL — ABNORMAL HIGH (ref 70–99)

## 2022-10-25 LAB — RETICULOCYTES
Immature Retic Fract: 22.9 % — ABNORMAL HIGH (ref 2.3–15.9)
RBC.: 3.97 MIL/uL — ABNORMAL LOW (ref 4.22–5.81)
Retic Count, Absolute: 76.6 10*3/uL (ref 19.0–186.0)
Retic Ct Pct: 1.9 % (ref 0.4–3.1)

## 2022-10-25 LAB — MAGNESIUM: Magnesium: 1.7 mg/dL (ref 1.7–2.4)

## 2022-10-25 LAB — IRON AND TIBC
Iron: 105 ug/dL (ref 45–182)
Saturation Ratios: 56 % — ABNORMAL HIGH (ref 17.9–39.5)
TIBC: 190 ug/dL — ABNORMAL LOW (ref 250–450)
UIBC: 84 ug/dL

## 2022-10-25 LAB — MISC LABCORP TEST (SEND OUT): Labcorp test code: 9985

## 2022-10-25 LAB — FOLATE: Folate: 13 ng/mL (ref >5.9)

## 2022-10-25 LAB — CULTURE, FUNGUS WITHOUT SMEAR

## 2022-10-25 LAB — VITAMIN B12: Vitamin B-12: 453 pg/mL (ref 180–914)

## 2022-10-25 LAB — FERRITIN: Ferritin: 1132 ng/mL — ABNORMAL HIGH (ref 24–336)

## 2022-10-25 MED ORDER — INSULIN GLARGINE-YFGN 100 UNIT/ML ~~LOC~~ SOLN
35.0000 [IU] | Freq: Every day | SUBCUTANEOUS | Status: DC
  Administered 2022-10-27 – 2022-10-29 (×3): 35 [IU] via SUBCUTANEOUS
  Filled 2022-10-25 (×6): qty 0.35

## 2022-10-25 MED ORDER — POTASSIUM CHLORIDE CRYS ER 20 MEQ PO TBCR
40.0000 meq | EXTENDED_RELEASE_TABLET | ORAL | Status: AC
  Administered 2022-10-25 (×2): 40 meq via ORAL
  Filled 2022-10-25 (×2): qty 2

## 2022-10-25 MED ORDER — LORAZEPAM 2 MG/ML IJ SOLN
1.0000 mg | Freq: Once | INTRAMUSCULAR | Status: AC | PRN
  Administered 2022-10-26: 1 mg via INTRAVENOUS
  Filled 2022-10-25: qty 1

## 2022-10-25 MED ORDER — INSULIN ASPART 100 UNIT/ML IJ SOLN
6.0000 [IU] | Freq: Three times a day (TID) | INTRAMUSCULAR | Status: DC
  Administered 2022-10-26 – 2022-10-29 (×10): 6 [IU] via SUBCUTANEOUS

## 2022-10-25 NOTE — Progress Notes (Signed)
  NEUROSURGERY PROGRESS NOTE   No issues overnight. Unable to do MRI due to movement.  EXAM:  BP (!) 144/88 (BP Location: Left Arm)   Pulse 82   Temp (!) 97.4 F (36.3 C)   Resp 19   Ht 5\' 10"  (1.778 m)   Wt 102.1 kg   SpO2 93%   BMI 32.28 kg/m   Awake, alert Speech very delayed, can answer yes/no.  Not reliably following commands CN grossly intact  MAE well  IMPRESSION:  72 y.o. male with severe global encephalopathy of unclear etiology, contrasted MRI has revealed widespread leptomeningeal enhancement and a discrete enhancing lesion in the anterior right temporal lobe. Biopsy remains the best chance for obtaining a diagnosis as all serum/CSF/imaging workup has been inconclusive thus far. Imaging also reveals subacute progression of primarily supratentorial ventriculomegaly.  PLAN: - Will proceed tomorrow with stereotactic right temporal crani for resection of lesion. Will also attempt a right temporal ventriculocisternostomy concurrently  - NPO p MN - Re-attempt stereotactic protocol MRI today with medication  I have spoken with the patient's son regarding the plan above. We reviewed the details of the surgery and the potential risks including stroke leading to weakness/paralysis/coma/death, bleeding, infection, SZ and general risks of anesthesia including MI, stroke, blood clots. All his questions were answered and the patient's son provided verbal consent to proceed.    Lisbeth Renshaw, MD [facility]

## 2022-10-25 NOTE — Progress Notes (Signed)
Subjective: No significant changes  Exam: Vitals:   10/25/22 0538 10/25/22 0839  BP: (!) 148/87 (!) 144/88  Pulse: 87 82  Resp:  19  Temp: (!) 97.5 F (36.4 C) (!) 97.4 F (36.3 C)  SpO2: 100% 93%   Gen: In bed, NAD Resp: non-labored breathing, no acute distress Abd: soft, nt  Neuro: MS: Awake, reaches out to shake my hand, does not answer most questions, but does not verbally answer questions when I am asking how many fingers I am holding up to test visual fields.  He does follow some commands, but not all CN: Visual fields full, tracks across midline in both directions, face symmetric Motor: Moves all extremities relatively symmetrically Sensory: Responds to mild stimulation bilaterally   Pertinent Labs: Creatinine 1.0  Impression: 72 year old male who presented with progressive encephalopathy who has undergone extensive workup for infectious, autoimmune, malignant etiologies, to LPs with increased protein and WBC, but thus far no clear etiology has been discovered.  Protein 14 3 3/tau/RT quic testing has been sent, but with the presence of enhancing lesions as well as pleocytosis on LP I think that CJD is exceedingly unlikely unless he has another comorbid illness causing the imaging findings.  I do think that he has some sort of destructive brain process given the ring-enhancing lesion and therefore 14 3 3  and tau are very likely to be positive, but the rt-quic should be negative.  At this point, I do not think we are likely to get a diagnosis without tissue sampling and therefore I have asked neurosurgery for assistance with a biopsy and we are currently planning a biopsy for tomorrow to excise the enhancing lesion for pathology and infectious workup.  Recommendations: 1) appreciate neurosurgical assistance 2) neurology will follow  Ritta Slot, MD [facility] [phone]  If 7pm- 7am, please page neurology on call as listed in AMION.

## 2022-10-25 NOTE — Progress Notes (Signed)
PROGRESS NOTE  Jeremy Medina. RKY:[id number] DOB: Oct 17, 1950   PCP: [facility]  Patient is from: Home  DOA: 09/28/2022 LOS: 23  Chief complaints Chief Complaint  Patient presents with   Weakness    Falls     Brief Narrative / Interim history: 72 year old M with PMH of DM-2, HTN, pancreatic insufficiency, GERD, chronic pain on methadone, PTSD and anxiety sent to ED from neurosurgery clinic for progressive subacute encephalopathy on 4/12.  Patient was recently hospitalized at the [facility] for subacute encephalopathy 3 months back and was thought to have NPH.  He was discharged home with neurology and neurosurgery follow-up.  CT head and MRV without acute finding.  Neurology consulted.  No improvement with IV thiamine.  Underwent LP on 4/17.  There was concern about fungal meningitis.  ID consulted and he was started on Amphotericin.  There was some improvement in mentation.  Had repeat LP on 4/25 that showed 34 cm water opening pressure.  CSF study unrevealing.  He also had CT chest, abdomen and pelvis that was negative for malignancy.  Neurosurgery indicated that patient is poor candidate for shunt placement on 4/30.  Neurology has reassessed patient on 5/2 with concern for CJD.  CSF fluid analysis has been sent.  At this time, neurology recommending brain biopsy which is planned for 11/15/2022.     Subjective: Seen and examined earlier this morning.  No events overnight of this morning.  Patient is somewhat nonverbal.  Follows some commands but not consistently.  Does not appear to be in distress.  Objective: Vitals:   10/24/22 1033 10/24/22 1951 10/25/22 0538 10/25/22 0839  BP: 139/84 123/69 (!) 148/87 (!) 144/88  Pulse: 73 97 87 82  Resp:    19  Temp:  98 F (36.7 C) (!) 97.5 F (36.4 C) (!) 97.4 F (36.3 C)  TempSrc:  Oral Oral   SpO2:  98% 100% 93%  Weight:      Height:        Examination:  GENERAL: No apparent distress.  Nontoxic. HEENT: MMM.  Vision and  hearing grossly intact.  NECK: Supple.  No apparent JVD.  RESP:  No IWOB.  Fair aeration bilaterally. CVS:  RRR. Heart sounds normal.  ABD/GI/GU: BS+. Abd soft, NTND.  MSK/EXT:   No apparent deformity. Moves extremities. No edema.  SKIN: no apparent skin lesion or wound NEURO: Awake and alert but not verbal.  Follows some commands but not consistent.  PSYCH: Calm. Normal affect.   Procedures:  4/17 LP  4/25 LP   Assessment and plan: Principal Problem:   Suspected fungal meningitis Active Problems:   Progressive subacute encephalopathy   Chronic pain syndrome   Depression   Anxiety   Benign essential hypertension   Diabetes mellitus type 2, controlled, without complications (HCC)   Hyperlipidemia   Acute kidney injury (HCC)   Folate deficiency   Transaminitis   Obesity (BMI 30-39.9)   Hypokalemia   Dysphagia   Vomiting   Altered mental status   Fecal impaction (HCC)   Abnormal CT scan, colon  Progressive subacute encephalopathy: Unclear etiology despite extensive workup.  The probably did not improve with high-dose thiamine.  Some concern about fungal meningitis at some point but felt to be less likely.  Currently awake and alert but not verbal.  Follows some commands but not consistently. -Neurology following-plan for brain biopsy by neurosurgery on 5/10 -Reorientation and delirium precaution   Normocytic anemia/BRBPR: Felt to be sterile coral ulcer.  H&H relatively stable Recent Labs    10/17/22 1419 10/20/22 0425 10/21/22 0230 10/21/22 0712 10/21/22 1212 10/21/22 1749 10/22/22 0047 10/23/22 0846 10/24/22 0326 10/25/22 0800  HGB 12.1* 11.5* 11.4* 11.3* 11.3* 11.7* 9.9* 10.9* 9.8* 11.5*  -Continue monitoring -Continue folic acid supplementation -No indication or plan for colonoscopy per GI  Uncontrolled IDDM-2 with hyperglycemia: A1c 9.7% on 3/28.  On 70/30 insulin at home. Recent Labs  Lab 10/24/22 0802 10/24/22 1242 10/24/22 2002 10/24/22 2342  10/25/22 0840  GLUCAP 237* 214* 143* 181* 240*  -Increase Semglee from 30 units daily to 35 units daily starting 5/10 -Increase NovoLog from 4 to 6 units 3 times daily with meals -Continue SSI-moderate -Further adjustment as appropriate  Hypokalemia/hypomagnesemia -Monitor replenish as appropriate   Chronic pain syndrome -Continue methadone 10 mg 3 times daily   Transaminitis: Likely NASH given normal [place].     Fecal impaction/constipation:  -Continue bowel regimen   Benign essential hypertension -Continue amlodipine -Hydralazine as needed   Folate deficiency  -Continue folate supplement.   Anxiety: Stable.   Goals of care discussion.  Palliative care on board.   Pancreatic insufficiency -Continue on Creon  Obesity Body mass index is 32.28 kg/m.           DVT prophylaxis:  SCDs Start: 10/21/22 0604 Place and maintain sequential compression device Start: 10/02/22 1610  Code Status: Full code Family Communication: None at bedside Level of care: Med-Surg Status is: Inpatient Remains inpatient appropriate because: Due to encephalopathy   Final disposition: TBD Consultants:  Neurology Neurosurgery Infectious disease  35 minutes with more than 50% spent in reviewing records, counseling patient/family and coordinating care.   Sch Meds:  Scheduled Meds:  amLODipine  10 mg Oral Daily   feeding supplement (GLUCERNA SHAKE)  237 mL Oral TID BM   folic acid  1 mg Oral Daily   insulin aspart  0-15 Units Subcutaneous TID WC   insulin aspart  0-5 Units Subcutaneous QHS   insulin aspart  6 Units Subcutaneous TID WC   [START ON 10/26/2022] insulin glargine-yfgn  35 Units Subcutaneous Daily   lidocaine (PF)  5 mL Other Once   lipase/protease/amylase  36,000 Units Oral TID AC   methadone  10 mg Oral TID   pantoprazole (PROTONIX) IV  40 mg Intravenous Q12H   polyethylene glycol  17 g Oral BID   potassium chloride  40 mEq Oral Q4H   senna-docusate  1 tablet Oral  QHS   sodium chloride flush  3 mL Intravenous Q12H   Continuous Infusions: PRN Meds:.acetaminophen, acetaminophen, LORazepam, ondansetron **OR** ondansetron (ZOFRAN) IV  Antimicrobials: Anti-infectives (From admission, onward)    Start     Dose/Rate Route Frequency Ordered Stop   10/12/22 0700  vancomycin (VANCOREADY) IVPB 750 mg/150 mL  Status:  Discontinued        750 mg 150 mL/hr over 60 Minutes Intravenous Every 12 hours 10/11/22 1811 10/12/22 1011   10/11/22 2200  cefTRIAXone (ROCEPHIN) 2 g in sodium chloride 0.9 % 100 mL IVPB  Status:  Discontinued        2 g 200 mL/hr over 30 Minutes Intravenous Every 12 hours 10/11/22 1749 10/13/22 2008   10/11/22 2000  ampicillin (OMNIPEN) 2 g in sodium chloride 0.9 % 100 mL IVPB  Status:  Discontinued        2 g 300 mL/hr over 20 Minutes Intravenous Every 4 hours 10/11/22 1752 10/12/22 1011   10/11/22 1845  cefTRIAXone (ROCEPHIN) 2 g in sodium chloride  0.9 % 100 mL IVPB  Status:  Discontinued        2 g 200 mL/hr over 30 Minutes Intravenous Every 24 hours 10/11/22 1746 10/11/22 1749   10/11/22 1845  vancomycin (VANCOREADY) IVPB 1750 mg/350 mL        1,750 mg 175 mL/hr over 120 Minutes Intravenous  Once 10/11/22 1754 10/11/22 2333   10/04/22 1500  amphotericin B liposome (AMBISOME) 500 mg in dextrose 5 % 500 mL IVPB  Status:  Discontinued        500 mg 312.5 mL/hr over 120 Minutes Intravenous Every 24 hours 10/04/22 1159 10/13/22 0904        I have personally reviewed the following labs and images: CBC: Recent Labs  Lab 10/21/22 0230 10/21/22 0712 10/21/22 1749 10/22/22 0047 10/23/22 0846 10/24/22 0326 10/25/22 0800  WBC 9.8  --   --  10.2 13.0* 13.2* 13.4*  HGB 11.4*   < > 11.7* 9.9* 10.9* 9.8* 11.5*  HCT 33.3*   < > 34.9* 30.4* 32.5* 29.1* 34.2*  MCV 84.1  --   --  87.1 86.0 85.6 84.2  PLT 113*  --   --  128* 172 156 PLATELET CLUMPS NOTED ON SMEAR, UNABLE TO ESTIMATE   < > = values in this interval not displayed.   BMP  &GFR Recent Labs  Lab 10/20/22 0425 10/20/22 1153 10/21/22 0230 10/22/22 0047 10/23/22 0846 10/24/22 0326 10/25/22 0800  NA 140   < > 144 142 145 138 140  K 2.5*   < > 2.8* 4.1 3.0* 3.7 3.2*  CL 109   < > 113* 112* 110 106 104  CO2 20*   < > 23 22 23 25 26   GLUCOSE 345*   < > 340* 510* 240* 322* 238*  BUN 40*   < > 32* 28* 19 15 12   CREATININE 1.42*   < > 1.20 1.20 0.97 1.00 0.88  CALCIUM 7.9*   < > 7.9* 7.9* 8.1* 8.0* 8.4*  MG 1.8  --  1.9 1.8  --  1.6* 1.7   < > = values in this interval not displayed.   Estimated Creatinine Clearance: 93.5 mL/min (by C-G formula based on SCr of 0.88 mg/dL). Liver & Pancreas: Recent Labs  Lab 10/25/22 0800  AST 35  ALT 177*  ALKPHOS 96  BILITOT 0.7  PROT 5.4*  ALBUMIN 2.7*   No results for input(s): "LIPASE", "AMYLASE" in the last 168 hours. No results for input(s): "AMMONIA" in the last 168 hours. Diabetic: No results for input(s): "HGBA1C" in the last 72 hours. Recent Labs  Lab 10/24/22 0802 10/24/22 1242 10/24/22 2002 10/24/22 2342 10/25/22 0840  GLUCAP 237* 214* 143* 181* 240*   Cardiac Enzymes: No results for input(s): "CKTOTAL", "CKMB", "CKMBINDEX", "TROPONINI" in the last 168 hours. No results for input(s): "PROBNP" in the last 8760 hours. Coagulation Profile: No results for input(s): "INR", "PROTIME" in the last 168 hours. Thyroid Function Tests: No results for input(s): "TSH", "T4TOTAL", "FREET4", "T3FREE", "THYROIDAB" in the last 72 hours. Lipid Profile: No results for input(s): "CHOL", "HDL", "LDLCALC", "TRIG", "CHOLHDL", "LDLDIRECT" in the last 72 hours. Anemia Panel: Recent Labs    10/25/22 0800  VITAMINB12 453  FOLATE 13.0  FERRITIN 1,132*  TIBC 190*  IRON 105  RETICCTPCT 1.9   Urine analysis:    Component Value Date/Time   COLORURINE AMBER (A) 09/28/2022 1931   APPEARANCEUR HAZY (A) 09/28/2022 1931   LABSPEC 1.020 09/28/2022 1931   PHURINE 5.0 09/28/2022  1931   GLUCOSEU >=500 (A) 09/28/2022  1931   HGBUR LARGE (A) 09/28/2022 1931   BILIRUBINUR NEGATIVE 09/28/2022 1931   BILIRUBINUR negative 04/27/2019 1559   KETONESUR 5 (A) 09/28/2022 1931   PROTEINUR 30 (A) 09/28/2022 1931   UROBILINOGEN 0.2 04/27/2019 1559   UROBILINOGEN 0.2 03/16/2015 1501   NITRITE NEGATIVE 09/28/2022 1931   LEUKOCYTESUR NEGATIVE 09/28/2022 1931   Sepsis Labs: Invalid input(s): "PROCALCITONIN", "LACTICIDVEN"  Microbiology: No results found for this or any previous visit (from the past 240 hour(s)).  Radiology Studies: No results found.    Kaytlynn Kochan T. Shaterria Sager [facility]  If 7PM-7AM, please contact night-coverage [email or web address] 10/25/2022, 11:24 AM
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
## 2022-10-25 NOTE — Progress Notes (Signed)
Physical Therapy Treatment and D/C Patient Details Name: Jeremy Medina. MRN: [id number] DOB: Aug 04, 1950 Today's Date: 10/25/2022   History of Present Illness Jeremy Manchego. is a 72 y.o. male who presents complaining of altered mental status and progressive decline.  MRI performed at [facility], concern for hydrocephalus and neurosurgeon recommended pt present to ED. Work-up pending. LP 10/03/2022. PMHx: type 2 diabetes, PTSD, chronic pain on methadone per son, hypertension, hyperlipidemia    PT Comments    Pt admitted with above diagnosis. Pt has made no progress over last several sessions and feel that he has plateaued. Pt not verbal most of the time, not following commands and leans heavily posterior and to right and has no awareness.  Pt always lying in  BM on arrival as well.  Given that pt has made no progress toward goals and is appearing to have plateaued, PT is signing off and nursing/mobility can use Maximove lift to get pt oOB daily.  D/C PT.  Recommendations for follow up therapy are one component of a multi-disciplinary discharge planning process, led by the attending physician.  Recommendations may be updated based on patient status, additional functional criteria and insurance authorization.  Follow Up Recommendations  Can patient physically be transported by private vehicle: No    Assistance Recommended at Discharge Frequent or constant Supervision/Assistance  Patient can return home with the following Two people to help with walking and/or transfers;Assistance with cooking/housework;Help with stairs or ramp for entrance;Assist for transportation   Equipment Recommendations  Other (comment) (TBA)    Recommendations for Other Services       Precautions / Restrictions Precautions Precautions: Fall Precaution Comments: posterior lean Restrictions Weight Bearing Restrictions: No     Mobility  Bed Mobility Overal bed mobility: Needs Assistance Bed Mobility: Supine to  Sit Rolling: Max assist Sidelying to sit: Total assist, +2 for physical assistance Supine to sit: Max assist Sit to supine: Total assist, +2 for physical assistance   General bed mobility comments: Pt lying in BM and condom cath came off therefore PT began by cleaning pt. Pt needed instruction with verbal and tactile cues and limited initiation from patient.   Pt rigid posture and makes little effort to assist. Once to EOB, leans posterior and to right and +2 persons cannot right pt.  Tried to get pt to reach for [facility] and pt still leaning heavily.   Ended up getting Maximove and moving pt to chair with it as pt is unsafe to try and stand over last few sessions.    Transfers Overall transfer level: Needs assistance                   Transfer via Lift Equipment: Maximove  Ambulation/Gait                   Stairs             Wheelchair Mobility    Modified Rankin (Stroke Patients Only)       Balance Overall balance assessment: Needs assistance Sitting-balance support: Feet supported, Bilateral upper extremity supported Sitting balance-Leahy Scale: Poor Sitting balance - Comments: strong posterior and right bias Postural control: Posterior lean, Right lateral lean                                  Cognition Arousal/Alertness: Awake/alert Behavior During Therapy: Flat affect Overall Cognitive Status: Difficult to assess Area of Impairment:  Problem solving, Awareness, Safety/judgement, Following commands, Attention                 Orientation Level: Situation, Time, Place Current Attention Level: Selective Memory: Decreased recall of precautions, Decreased short-term memory Following Commands: Follows one step commands inconsistently, Follows one step commands with increased time Safety/Judgement: Decreased awareness of safety, Decreased awareness of deficits Awareness: Intellectual Problem Solving: Slow processing, Requires verbal  cues, Requires tactile cues General Comments: limited vocalization, continues to have difficulty following one step commands        Exercises Other Exercises Other Exercises: Pt resists active movement    General Comments General comments (skin integrity, edema, etc.): VSS on RA      Pertinent Vitals/Pain Pain Assessment Pain Assessment: Faces Faces Pain Scale: Hurts little more Breathing: normal Negative Vocalization: none Facial Expression: smiling or inexpressive Body Language: relaxed Consolability: no need to console PAINAD Score: 0 Pain Location: grimacing with movmenet during bed mobility Pain Descriptors / Indicators: Grimacing Pain Intervention(s): Limited activity within patient's tolerance, Monitored during session, Repositioned    Home Living                          Prior Function            PT Goals (current goals can now be found in the care plan section) Acute Rehab PT Goals Patient Stated Goal: unable to state PT Goal Formulation: All assessment and education complete, DC therapy Time For Goal Achievement: 10/25/22 Potential to Achieve Goals: Poor Progress towards PT goals: Not progressing toward goals - comment (Pt has had decline in function and neuro w/u is continued.)    Frequency           PT Plan      Co-evaluation              AM-PAC PT "6 Clicks" Mobility   Outcome Measure  Help needed turning from your back to your side while in a flat bed without using bedrails?: Total Help needed moving from lying on your back to sitting on the side of a flat bed without using bedrails?: Total Help needed moving to and from a bed to a chair (including a wheelchair)?: Total Help needed standing up from a chair using your arms (e.g., wheelchair or bedside chair)?: Total Help needed to walk in hospital room?: Total Help needed climbing 3-5 steps with a railing? : Total 6 Click Score: 6    End of Session   Activity Tolerance:  Patient limited by fatigue Patient left: with call bell/phone within reach;in chair;with chair alarm set Nurse Communication: Mobility status;Need for lift equipment PT Visit Diagnosis: Unsteadiness on feet (R26.81);Muscle weakness (generalized) (M62.81)     Time: 1610-9604 PT Time Calculation (min) (ACUTE ONLY): 36 min  Charges:  $Therapeutic Activity: 23-37 mins                     [facility] M,PT Acute Rehab Services [phone]    Bevelyn Buckles 10/25/2022, 1:57 PM

## 2022-10-25 NOTE — Progress Notes (Signed)
I discussed the logistics of this case with Dr. Drue Second, infectious disease. Despite the fact that Creutzfeld-Jakob disease no-longer is on the realistic differential diagnosis, the fact that it was entertained requires precautions be taken in the operating room. As such, we will need to delay surgery for planning purposes until early next week. Furthermore, due to the need to incinerate instruments after the procedure, it would not be feasible to perform a stereotactic open craniotomy and discard an entire stereotactic craniotomy instrument set. We will therefore instead plan on stereotactic needle biopsy. Dr. Maisie Fus will resume care of this patient including operative biopsy. I did discuss the situation with the patient's son including change in operative plan, new timeframe, and that Dr. Maisie Fus will be taking over. All his questions were answered.  Lisbeth Renshaw, MD [facility]

## 2022-10-25 NOTE — Progress Notes (Signed)
This nurse entered patient's room to find him on the floor next to the bed and his son standing over him.  Son states he was attempting to assist the patient from the recliner chair to the bed but "I thought he had more strength than he does" and patient "slumped" to the floor.  Patient assisted up off the floor and to the bed with assist of 4.  Son denies patient hitting his head or any other injuries.  No obvious signs of injury observed.  Vital signs obtained and stable, MD made aware and safety zone done.

## 2022-10-25 NOTE — Progress Notes (Signed)
Physical Therapy Discharge Patient Details Name: Jeremy Medina. MRN: [id number] DOB: 03/21/1951 Today's Date: 10/25/2022 Time: 4098-1191 PT Time Calculation (min) (ACUTE ONLY): 36 min  Patient discharged from PT services secondary to patient has made no progress toward goals in a reasonable time frame.  Please see latest therapy progress note for current level of functioning and progress toward goals.    Progress and discharge plan discussed with patient and/or caregiver: Patient unable to participate in discharge planning and no caregivers available  GP     Bevelyn Buckles 10/25/2022, 2:00 PM  Javeon Macmurray M,PT Acute Rehab Services [phone]

## 2022-10-25 NOTE — Plan of Care (Signed)
Patient alert but disoriented x 4, incomprehensible speech but mostly non-verbal.  All meds given on time as ordered.  Diminished lungs.  Purewick in place.  POC maintained, will continue to monitor.  Problem: Education: Goal: Ability to describe self-care measures that may prevent or decrease complications (Diabetes Survival Skills Education) will improve Outcome: Progressing Goal: Individualized Educational Video(s) Outcome: Progressing   Problem: Coping: Goal: Ability to adjust to condition or change in health will improve Outcome: Progressing   Problem: Fluid Volume: Goal: Ability to maintain a balanced intake and output will improve Outcome: Progressing   Problem: Health Behavior/Discharge Planning: Goal: Ability to identify and utilize available resources and services will improve Outcome: Progressing Goal: Ability to manage health-related needs will improve Outcome: Progressing   Problem: Metabolic: Goal: Ability to maintain appropriate glucose levels will improve Outcome: Progressing   Problem: Nutritional: Goal: Maintenance of adequate nutrition will improve Outcome: Progressing Goal: Progress toward achieving an optimal weight will improve Outcome: Progressing   Problem: Skin Integrity: Goal: Risk for impaired skin integrity will decrease Outcome: Progressing   Problem: Tissue Perfusion: Goal: Adequacy of tissue perfusion will improve Outcome: Progressing   Problem: Education: Goal: Knowledge of General Education information will improve Description: Including pain rating scale, medication(s)/side effects and non-pharmacologic comfort measures Outcome: Progressing   Problem: Health Behavior/Discharge Planning: Goal: Ability to manage health-related needs will improve Outcome: Progressing   Problem: Clinical Measurements: Goal: Ability to maintain clinical measurements within normal limits will improve Outcome: Progressing Goal: Will remain free from  infection Outcome: Progressing Goal: Diagnostic test results will improve Outcome: Progressing Goal: Respiratory complications will improve Outcome: Progressing Goal: Cardiovascular complication will be avoided Outcome: Progressing   Problem: Activity: Goal: Risk for activity intolerance will decrease Outcome: Progressing   Problem: Nutrition: Goal: Adequate nutrition will be maintained Outcome: Progressing   Problem: Coping: Goal: Level of anxiety will decrease Outcome: Progressing   Problem: Elimination: Goal: Will not experience complications related to bowel motility Outcome: Progressing Goal: Will not experience complications related to urinary retention Outcome: Progressing   Problem: Pain Managment: Goal: General experience of comfort will improve Outcome: Progressing   Problem: Safety: Goal: Ability to remain free from injury will improve Outcome: Progressing   Problem: Skin Integrity: Goal: Risk for impaired skin integrity will decrease Outcome: Progressing

## 2022-10-26 ENCOUNTER — Encounter (HOSPITAL_COMMUNITY)
Admission: EM | Disposition: A | Discharge status: Hospice | Payer: Self-pay | Source: Home / Self Care | Attending: Internal Medicine

## 2022-10-26 ENCOUNTER — Inpatient Hospital Stay (HOSPITAL_COMMUNITY): Payer: No Typology Code available for payment source

## 2022-10-26 DIAGNOSIS — N179 Acute kidney failure, unspecified: Secondary | ICD-10-CM | POA: Diagnosis not present

## 2022-10-26 DIAGNOSIS — E119 Type 2 diabetes mellitus without complications: Secondary | ICD-10-CM | POA: Diagnosis not present

## 2022-10-26 DIAGNOSIS — G030 Nonpyogenic meningitis: Secondary | ICD-10-CM | POA: Diagnosis not present

## 2022-10-26 DIAGNOSIS — E538 Deficiency of other specified B group vitamins: Secondary | ICD-10-CM | POA: Diagnosis not present

## 2022-10-26 LAB — CBC
HCT: 34.1 % — ABNORMAL LOW (ref 39.0–52.0)
Hemoglobin: 11.3 g/dL — ABNORMAL LOW (ref 13.0–17.0)
MCH: 28.5 pg (ref 26.0–34.0)
MCHC: 33.1 g/dL (ref 30.0–36.0)
MCV: 86.1 fL (ref 80.0–100.0)
Platelets: 194 10*3/uL (ref 150–400)
RBC: 3.96 MIL/uL — ABNORMAL LOW (ref 4.22–5.81)
RDW: 15.7 % — ABNORMAL HIGH (ref 11.5–15.5)
WBC: 13.9 10*3/uL — ABNORMAL HIGH (ref 4.0–10.5)
nRBC: 0 % (ref 0.0–0.2)

## 2022-10-26 LAB — RENAL FUNCTION PANEL
Albumin: 2.7 g/dL — ABNORMAL LOW (ref 3.5–5.0)
Anion gap: 9 (ref 5–15)
BUN: 12 mg/dL (ref 8–23)
CO2: 26 mmol/L (ref 22–32)
Calcium: 8.8 mg/dL — ABNORMAL LOW (ref 8.9–10.3)
Chloride: 104 mmol/L (ref 98–111)
Creatinine, Ser: 1.05 mg/dL (ref 0.61–1.24)
GFR, Estimated: >60 mL/min (ref >60)
Glucose, Bld: 149 mg/dL — ABNORMAL HIGH (ref 70–99)
Phosphorus: 4 mg/dL (ref 2.5–4.6)
Potassium: 3.6 mmol/L (ref 3.5–5.1)
Sodium: 139 mmol/L (ref 135–145)

## 2022-10-26 LAB — GLUCOSE, CAPILLARY
Glucose-Capillary: 173 mg/dL — ABNORMAL HIGH (ref 70–99)
Glucose-Capillary: 158 mg/dL — ABNORMAL HIGH (ref 70–99)
Glucose-Capillary: 205 mg/dL — ABNORMAL HIGH (ref 70–99)
Glucose-Capillary: 147 mg/dL — ABNORMAL HIGH (ref 70–99)
Glucose-Capillary: 121 mg/dL — ABNORMAL HIGH (ref 70–99)

## 2022-10-26 LAB — MISC LABCORP TEST (SEND OUT): Labcorp test code: 9985

## 2022-10-26 LAB — MAGNESIUM: Magnesium: 1.7 mg/dL (ref 1.7–2.4)

## 2022-10-26 SURGERY — CRANIOTOMY TUMOR EXCISION
Anesthesia: General | Laterality: Right

## 2022-10-26 MED ORDER — GADOBUTROL 1 MMOL/ML IV SOLN
10.0000 mL | Freq: Once | INTRAVENOUS | Status: AC | PRN
  Administered 2022-10-26: 10 mL via INTRAVENOUS

## 2022-10-26 NOTE — Progress Notes (Signed)
Neurology Progress Note  Subjective: No significant changes Patient does wince and grimace with manipulation of bilateral lower extremities today   Exam: Current vital signs: BP (!) 122/90 (BP Location: Left Arm)   Pulse 85   Temp (!) 97.5 F (36.4 C)   Resp 18   Ht 5\' 10"  (1.778 m)   Wt 102.1 kg   SpO2 100%   BMI 32.28 kg/m  Vital signs in last 24 hours: Temp:  [95.4 F (35.2 C)-98.1 F (36.7 C)] 97.5 F (36.4 C) (05/10 1528) Pulse Rate:  [72-95] 85 (05/10 1528) Resp:  [16-18] 18 (05/10 1528) BP: (112-149)/(75-102) 122/90 (05/10 1528) SpO2:  [97 %-100 %] 100 % (05/10 1528)   Gen: In bed, NAD Resp: non-labored breathing, no respiratory distress Abd: soft, nt  Neuro: MS: Awake, does not answer questions, but says "yeah" when his name is called by examiner. He follows simple commands intermittently and at times, does not follow commands CN: Visual fields full, tracks across midline in both directions, face symmetric Motor: Moves all extremities relatively symmetrically Sensory: Responds to mild stimulation bilaterally  Pertinent Labs: Creatinine 1.0  Impression: 72 year old male who presented with progressive encephalopathy who has undergone extensive workup for infectious, autoimmune, malignant etiologies, to LPs with increased protein and WBC, but thus far no clear etiology has been discovered.  Protein 14-3-3/tau/RT quic testing has been sent, but with the presence of enhancing lesions as well as pleocytosis on LP I think that CJD is exceedingly unlikely unless he has another comorbid illness causing the imaging findings.  I do think that he has some sort of destructive brain process given the ring-enhancing lesion and therefore 14 3 3  and tau are very likely to be positive, but the rt-quic should be negative.  At this point, I do not think we are likely to get a diagnosis without tissue sampling and therefore we have asked neurosurgery for assistance with a biopsy and we  are currently planning next week to excise the enhancing lesion for pathology and infectious workup.  Some delay in biopsy due to the necessity for extra OR precautions with previous consideration of possible CJD diagnosis.   Recommendations: 1) appreciate neurosurgical assistance 2) neurology will follow along, will plan to next see patient Monday 5/13 but please reach out if additional questions arise over the weekend  Lanae Boast, AGACNP-BC [facility] Pager: [phone]  Attending Neurologist's note:  I personally saw this patient, gathering history, performing a neurologic examination, reviewing relevant labs, personally reviewing relevant imaging including MRI brain, and formulated the assessment and plan, adding the note above for completeness and clarity to accurately reflect my thoughts  Brooke Dare MD-PhD [facility] [phone]  Available 7 AM to 7 PM, outside these hours please contact Neurologist on call listed on AMION

## 2022-10-26 NOTE — Progress Notes (Signed)
Patient to MRI at this time.

## 2022-10-26 NOTE — Progress Notes (Signed)
Patient returns from MRI at this time.

## 2022-10-26 NOTE — Progress Notes (Signed)
PROGRESS NOTE  Jeremy Medina. ZOX:[id number] DOB: 04-04-1951   PCP: [facility]  Patient is from: Home  DOA: 09/28/2022 LOS: 24  Chief complaints Chief Complaint  Patient presents with   Weakness    Falls     Brief Narrative / Interim history: 72 year old M with PMH of DM-2, HTN, pancreatic insufficiency, GERD, chronic pain on methadone, PTSD and anxiety sent to ED from neurosurgery clinic for progressive subacute encephalopathy on 4/12.  Patient was recently hospitalized at the [facility] for subacute encephalopathy 3 months back and was thought to have NPH.  He was discharged home with neurology and neurosurgery follow-up.  CT head and MRV without acute finding.  Neurology consulted.  No improvement with IV thiamine.  Underwent LP on 4/17.  There was concern about fungal meningitis.  ID consulted and he was started on Amphotericin.  There was some improvement in mentation.  Had repeat LP on 4/25 that showed 34 cm water opening pressure.  CSF study unrevealing.  He also had CT chest, abdomen and pelvis that was negative for malignancy.  Neurosurgery indicated that patient is poor candidate for shunt placement on 4/30.  Neurology has reassessed patient on 5/2 with concern for CJD.  Neurology recommended brain biopsy which is delayed until next week due to logistic issue.     Subjective: Seen and examined earlier this morning.  No major events overnight of this morning.  No complaints but not a great historian.  He is minimally verbal.  Follows some commands.  Does not appear to be in distress.  Objective: Vitals:   10/25/22 2054 10/26/22 0116 10/26/22 0459 10/26/22 0759  BP: (!) 120/90 (!) 117/102 (!) 149/93 (!) 123/93  Pulse: 82 81 72 87  Resp: 17 16 18 18   Temp: 97.9 F (36.6 C) 98.1 F (36.7 C) 97.6 F (36.4 C) (!) 95.4 F (35.2 C)  TempSrc:      SpO2: 100% 97% 100% 98%  Weight:      Height:        Examination:  GENERAL: No apparent distress.  Nontoxic. HEENT:  MMM.  Vision and hearing grossly intact.  NECK: Supple.  No apparent JVD.  RESP:  No IWOB.  Fair aeration bilaterally. CVS:  RRR. Heart sounds normal.  ABD/GI/GU: BS+. Abd soft, NTND.  MSK/EXT:   No apparent deformity. Moves extremities. No edema.  SKIN: no apparent skin lesion or wound NEURO: Awake and alert.  Minimally verbal.  Follows some commands but not consistent.  PSYCH: Calm. Normal affect.   Procedures:  4/17 LP  4/25 LP   Assessment and plan: Principal Problem:   Suspected fungal meningitis Active Problems:   Progressive subacute encephalopathy   Chronic pain syndrome   Depression   Anxiety   Benign essential hypertension   Diabetes mellitus type 2, controlled, without complications (HCC)   Hyperlipidemia   Acute kidney injury (HCC)   Folate deficiency   Transaminitis   Obesity (BMI 30-39.9)   Hypokalemia   Dysphagia   Vomiting   Altered mental status   Fecal impaction (HCC)   Abnormal CT scan, colon  Progressive subacute encephalopathy: Unclear etiology despite extensive workup.  The probably did not improve with high-dose thiamine.  Some concern about fungal meningitis at some point but felt to be less likely.  Currently awake and alert.  Minimally verbal.  Follows some commands but not consistently. -Neurology recommended brain biopsy to rule out CJD.   -Neurosurgery consulted.  Brain biopsy delayed until the week  of 5/13 due to logistic issues. 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  H&H relatively stable Recent Labs    10/20/22 0425 10/21/22 0230 10/21/22 0712 10/21/22 1212 10/21/22 1749 10/22/22 0047 10/23/22 0846 10/24/22 0326 10/25/22 0800 10/26/22 0650  HGB 11.5* 11.4* 11.3* 11.3* 11.7* 9.9* 10.9* 9.8* 11.5* 11.3*  -Continue monitoring -Continue folic acid supplementation -No indication or plan for colonoscopy per GI  Uncontrolled IDDM-2 with hyperglycemia: A1c 9.7% on 3/28.  On  70/30 insulin at home. Recent Labs  Lab 10/25/22 1237 10/25/22 1545 10/25/22 2047 10/26/22 0458 10/26/22 0808  GLUCAP 120* 96 323* 173* 158*  -Increase Semglee from 30 units daily to 35 units  -Continue NovoLog 6 units 3 times daily with meals. -Continue SSI-moderate -Further adjustment as appropriate  Hypokalemia/hypomagnesemia -Monitor replenish as appropriate   Chronic pain syndrome -Continue methadone 10 mg 3 times daily   Transaminitis: Likely NASH given normal [place].     Fecal impaction/constipation:  -Continue bowel regimen   Benign essential hypertension -Continue amlodipine -Hydralazine as needed   Folate deficiency  -Continue folate supplement.   Anxiety: Stable.   Goals of care discussion.  Palliative care on board.   Pancreatic insufficiency -Continue on Creon  Obesity Body mass index is 32.28 kg/m.           DVT prophylaxis:  SCDs Start: 10/21/22 0604 Place and maintain sequential compression device Start: 10/02/22 1610  Code Status: Full code Family Communication: None at bedside Level of care: Med-Surg Status is: Inpatient Remains inpatient appropriate because: Due to encephalopathy   Final disposition: TBD Consultants:  Neurology Neurosurgery Infectious disease  35 minutes with more than 50% spent in reviewing records, counseling patient/family and coordinating care.   Sch Meds:  Scheduled Meds:  amLODipine  10 mg Oral Daily   feeding supplement (GLUCERNA SHAKE)  237 mL Oral TID BM   folic acid  1 mg Oral Daily   insulin aspart  0-15 Units Subcutaneous TID WC   insulin aspart  0-5 Units Subcutaneous QHS   insulin aspart  6 Units Subcutaneous TID WC   insulin glargine-yfgn  35 Units Subcutaneous Daily   lidocaine (PF)  5 mL Other Once   lipase/protease/amylase  36,000 Units Oral TID AC   methadone  10 mg Oral TID   pantoprazole (PROTONIX) IV  40 mg Intravenous Q12H   polyethylene glycol  17 g Oral BID   senna-docusate  1  tablet Oral QHS   sodium chloride flush  3 mL Intravenous Q12H   Continuous Infusions: PRN Meds:.acetaminophen, acetaminophen, ondansetron **OR** ondansetron (ZOFRAN) IV  Antimicrobials: Anti-infectives (From admission, onward)    Start     Dose/Rate Route Frequency Ordered Stop   10/12/22 0700  vancomycin (VANCOREADY) IVPB 750 mg/150 mL  Status:  Discontinued        750 mg 150 mL/hr over 60 Minutes Intravenous Every 12 hours 10/11/22 1811 10/12/22 1011   10/11/22 2200  cefTRIAXone (ROCEPHIN) 2 g in sodium chloride 0.9 % 100 mL IVPB  Status:  Discontinued        2 g 200 mL/hr over 30 Minutes Intravenous Every 12 hours 10/11/22 1749 10/13/22 2008   10/11/22 2000  ampicillin (OMNIPEN) 2 g in sodium chloride 0.9 % 100 mL IVPB  Status:  Discontinued        2 g 300 mL/hr over 20 Minutes Intravenous Every 4 hours 10/11/22 1752 10/12/22 1011   10/11/22 1845  cefTRIAXone (ROCEPHIN) 2 g  in sodium chloride 0.9 % 100 mL IVPB  Status:  Discontinued        2 g 200 mL/hr over 30 Minutes Intravenous Every 24 hours 10/11/22 1746 10/11/22 1749   10/11/22 1845  vancomycin (VANCOREADY) IVPB 1750 mg/350 mL        1,750 mg 175 mL/hr over 120 Minutes Intravenous  Once 10/11/22 1754 10/11/22 2333   10/04/22 1500  amphotericin B liposome (AMBISOME) 500 mg in dextrose 5 % 500 mL IVPB  Status:  Discontinued        500 mg 312.5 mL/hr over 120 Minutes Intravenous Every 24 hours 10/04/22 1159 10/13/22 0904        I have personally reviewed the following labs and images: CBC: Recent Labs  Lab 10/22/22 0047 10/23/22 0846 10/24/22 0326 10/25/22 0800 10/26/22 0650  WBC 10.2 13.0* 13.2* 13.4* 13.9*  HGB 9.9* 10.9* 9.8* 11.5* 11.3*  HCT 30.4* 32.5* 29.1* 34.2* 34.1*  MCV 87.1 86.0 85.6 84.2 86.1  PLT 128* 172 156 PLATELET CLUMPS NOTED ON SMEAR, UNABLE TO ESTIMATE 194   BMP &GFR Recent Labs  Lab 10/21/22 0230 10/22/22 0047 10/23/22 0846 10/24/22 0326 10/25/22 0800 10/26/22 0650  NA 144 142 145  138 140 139  K 2.8* 4.1 3.0* 3.7 3.2* 3.6  CL 113* 112* 110 106 104 104  CO2 23 22 23 25 26 26   GLUCOSE 340* 510* 240* 322* 238* 149*  BUN 32* 28* 19 15 12 12   CREATININE 1.20 1.20 0.97 1.00 0.88 1.05  CALCIUM 7.9* 7.9* 8.1* 8.0* 8.4* 8.8*  MG 1.9 1.8  --  1.6* 1.7 1.7  PHOS  --   --   --   --   --  4.0   Estimated Creatinine Clearance: 78.3 mL/min (by C-G formula based on SCr of 1.05 mg/dL). Liver & Pancreas: Recent Labs  Lab 10/25/22 0800 10/26/22 0650  AST 35  --   ALT 177*  --   ALKPHOS 96  --   BILITOT 0.7  --   PROT 5.4*  --   ALBUMIN 2.7* 2.7*   No results for input(s): "LIPASE", "AMYLASE" in the last 168 hours. No results for input(s): "AMMONIA" in the last 168 hours. Diabetic: No results for input(s): "HGBA1C" in the last 72 hours. Recent Labs  Lab 10/25/22 1237 10/25/22 1545 10/25/22 2047 10/26/22 0458 10/26/22 0808  GLUCAP 120* 96 323* 173* 158*   Cardiac Enzymes: No results for input(s): "CKTOTAL", "CKMB", "CKMBINDEX", "TROPONINI" in the last 168 hours. No results for input(s): "PROBNP" in the last 8760 hours. Coagulation Profile: No results for input(s): "INR", "PROTIME" in the last 168 hours. Thyroid Function Tests: No results for input(s): "TSH", "T4TOTAL", "FREET4", "T3FREE", "THYROIDAB" in the last 72 hours. Lipid Profile: No results for input(s): "CHOL", "HDL", "LDLCALC", "TRIG", "CHOLHDL", "LDLDIRECT" in the last 72 hours. Anemia Panel: Recent Labs    10/25/22 0800  VITAMINB12 453  FOLATE 13.0  FERRITIN 1,132*  TIBC 190*  IRON 105  RETICCTPCT 1.9   Urine analysis:    Component Value Date/Time   COLORURINE AMBER (A) 09/28/2022 1931   APPEARANCEUR HAZY (A) 09/28/2022 1931   LABSPEC 1.020 09/28/2022 1931   PHURINE 5.0 09/28/2022 1931   GLUCOSEU >=500 (A) 09/28/2022 1931   HGBUR LARGE (A) 09/28/2022 1931   BILIRUBINUR NEGATIVE 09/28/2022 1931   BILIRUBINUR negative 04/27/2019 1559   KETONESUR 5 (A) 09/28/2022 1931   PROTEINUR 30 (A)  09/28/2022 1931   UROBILINOGEN 0.2 04/27/2019 1559   UROBILINOGEN 0.2  03/16/2015 1501   NITRITE NEGATIVE 09/28/2022 1931   LEUKOCYTESUR NEGATIVE 09/28/2022 1931   Sepsis Labs: Invalid input(s): "PROCALCITONIN", "LACTICIDVEN"  Microbiology: No results found for this or any previous visit (from the past 240 hour(s)).  Radiology Studies: No results found.    Cypress Fanfan T. Davin Muramoto [facility]  If 7PM-7AM, please contact night-coverage [email or web address] 10/26/2022, 12:01 PM

## 2022-10-26 NOTE — Progress Notes (Signed)
Occupational Therapy Treatment Patient Details Name: Jeremy Medina. MRN: [id number] DOB: [birth year] Today's Date: 10/26/2022   History of present illness Abdalla Adornetto. is a 72 y.o. male who presents complaining of altered mental status and progressive decline.  MRI performed at [facility], concern for hydrocephalus and neurosurgeon recommended pt present to ED. Work-up pending. LP 10/03/2022. PMHx: type 2 diabetes, PTSD, chronic pain on methadone per son, hypertension, hyperlipidemia   OT comments  Patient demonstrating limited progress due to cognition and difficulty following directions. Patient requiring mod to max assist for sitting balance on EOB and following directions <25% of time with reaching or attempting grooming tasks. Patient able to perform light grooming at bed level but requires HOH to initiate and mod assist to complete. Patient will benefit from continued inpatient follow up therapy, <3 hours/day to address self care, transfers, and cognition.    Recommendations for follow up therapy are one component of a multi-disciplinary discharge planning process, led by the attending physician.  Recommendations may be updated based on patient status, additional functional criteria and insurance authorization.    Assistance Recommended at Discharge Frequent or constant Supervision/Assistance  Patient can return home with the following  A lot of help with walking and/or transfers;Two people to help with walking and/or transfers;A lot of help with bathing/dressing/bathroom;Two people to help with bathing/dressing/bathroom;Direct supervision/assist for medications management;Direct supervision/assist for financial management;Assist for transportation;Help with stairs or ramp for entrance;Assistance with feeding;Assistance with cooking/housework   Equipment Recommendations  None recommended by OT    Recommendations for Other Services      Precautions / Restrictions  Precautions Precautions: Fall Precaution Comments: posterior lean Restrictions Weight Bearing Restrictions: No       Mobility Bed Mobility Overal bed mobility: Needs Assistance Bed Mobility: Supine to Sit, Sit to Supine     Supine to sit: Max assist Sit to supine: Total assist   General bed mobility comments: Patient with limited participation requiring assistance for trunk and BLEs    Transfers                   General transfer comment: not attempted     Balance Overall balance assessment: Needs assistance Sitting-balance support: Feet supported, Bilateral upper extremity supported Sitting balance-Leahy Scale: Poor Sitting balance - Comments: strong posterior and right bias Postural control: Posterior lean, Right lateral lean                                 ADL either performed or assessed with clinical judgement   ADL Overall ADL's : Needs assistance/impaired     Grooming: Wash/dry hands;Wash/dry face;Moderate assistance;Bed level Grooming Details (indicate cue type and reason): HOH to initiate and mod assist to complete                               General ADL Comments: difficulty following directions    Extremity/Trunk Assessment              Vision       Perception     Praxis      Cognition Arousal/Alertness: Awake/alert Behavior During Therapy: Flat affect Overall Cognitive Status: Difficult to assess Area of Impairment: Problem solving, Awareness, Safety/judgement, Following commands, Attention                 Orientation Level: Situation, Time, Place Current Attention Level: Selective  Memory: Decreased recall of precautions, Decreased short-term memory Following Commands: Follows one step commands inconsistently, Follows one step commands with increased time Safety/Judgement: Decreased awareness of safety, Decreased awareness of deficits Awareness: Intellectual Problem Solving: Slow processing,  Requires verbal cues, Requires tactile cues General Comments: following commands >25% of time, none vocal this session        Exercises Exercises: Other exercises Other Exercises Other Exercises: attempted reaching activity on EOB with limited participation due to dfficulty following commands    Shoulder Instructions       General Comments VSS on RA    Pertinent Vitals/ Pain       Pain Assessment Pain Assessment: Faces Faces Pain Scale: Hurts little more Pain Location: grimacing with movmenet during bed mobility Pain Descriptors / Indicators: Grimacing Pain Intervention(s): Limited activity within patient's tolerance, Monitored during session, Repositioned  Home Living                                          Prior Functioning/Environment              Frequency  Min 1X/week        Progress Toward Goals  OT Goals(current goals can now be found in the care plan section)  Progress towards OT goals: Not progressing toward goals - comment (limited progress due to cognition)  Acute Rehab OT Goals OT Goal Formulation: Patient unable to participate in goal setting Time For Goal Achievement: 10/27/22 Potential to Achieve Goals: Good ADL Goals Pt Will Perform Grooming: with max assist;sitting Pt Will Perform Upper Body Dressing: with max assist;standing Pt Will Transfer to Toilet: with max assist;stand pivot transfer;bedside commode Additional ADL Goal #1: Pt will follow 1 step commands 50% of the time to complete ADL task  Plan Discharge plan remains appropriate    Co-evaluation                 AM-PAC OT "6 Clicks" Daily Activity     Outcome Measure   Help from another person eating meals?: A Lot Help from another person taking care of personal grooming?: A Lot Help from another person toileting, which includes using toliet, bedpan, or urinal?: A Lot Help from another person bathing (including washing, rinsing, drying)?: A Lot Help  from another person to put on and taking off regular upper body clothing?: A Lot Help from another person to put on and taking off regular lower body clothing?: A Lot 6 Click Score: 12    End of Session    OT Visit Diagnosis: Unsteadiness on feet (R26.81);Other abnormalities of gait and mobility (R26.89);Muscle weakness (generalized) (M62.81);Pain   Activity Tolerance Patient tolerated treatment well   Patient Left in chair;with call bell/phone within reach;with chair alarm set   Nurse Communication Mobility status        Time: 0811-0827 OT Time Calculation (min): 16 min  Charges: OT General Charges $OT Visit: 1 Visit OT Treatments $Therapeutic Activity: 8-22 mins  Alfonse Flavors, OTA Acute Rehabilitation Services  Office [phone]   Dewain Penning 10/26/2022, 9:57 AM

## 2022-10-27 ENCOUNTER — Inpatient Hospital Stay (HOSPITAL_COMMUNITY): Payer: No Typology Code available for payment source

## 2022-10-27 DIAGNOSIS — E119 Type 2 diabetes mellitus without complications: Secondary | ICD-10-CM | POA: Diagnosis not present

## 2022-10-27 DIAGNOSIS — E538 Deficiency of other specified B group vitamins: Secondary | ICD-10-CM | POA: Diagnosis not present

## 2022-10-27 DIAGNOSIS — G030 Nonpyogenic meningitis: Secondary | ICD-10-CM | POA: Diagnosis not present

## 2022-10-27 DIAGNOSIS — N179 Acute kidney failure, unspecified: Secondary | ICD-10-CM | POA: Diagnosis not present

## 2022-10-27 LAB — GLUCOSE, CAPILLARY
Glucose-Capillary: 214 mg/dL — ABNORMAL HIGH (ref 70–99)
Glucose-Capillary: 126 mg/dL — ABNORMAL HIGH (ref 70–99)
Glucose-Capillary: 112 mg/dL — ABNORMAL HIGH (ref 70–99)
Glucose-Capillary: 388 mg/dL — ABNORMAL HIGH (ref 70–99)

## 2022-10-27 MED ORDER — SENNOSIDES-DOCUSATE SODIUM 8.6-50 MG PO TABS: 1.0000 | ORAL_TABLET | Freq: Two times a day (BID) | ORAL | Status: DC | PRN

## 2022-10-27 MED ORDER — SENNOSIDES-DOCUSATE SODIUM 8.6-50 MG PO TABS
2.0000 | ORAL_TABLET | Freq: Once | ORAL | Status: AC
  Administered 2022-10-27: 2 via ORAL
  Filled 2022-10-27: qty 2

## 2022-10-27 NOTE — Progress Notes (Signed)
PROGRESS NOTE  Jeremy Medina. ZOX:[id number] DOB: May 09, 1951   PCP: [facility]  Patient is from: Home  DOA: 09/28/2022 LOS: 25  Chief complaints Chief Complaint  Patient presents with   Weakness    Falls     Brief Narrative / Interim history: 72 year old M with PMH of DM-2, HTN, pancreatic insufficiency, GERD, chronic pain on methadone, PTSD and anxiety sent to ED from neurosurgery clinic for progressive subacute encephalopathy on 4/12.  Patient was recently hospitalized at the [facility] for subacute encephalopathy 3 months back and was thought to have NPH.  He was discharged home with neurology and neurosurgery follow-up.  CT head and MRV without acute finding.  Neurology consulted.  No improvement with IV thiamine.  Underwent LP on 4/17.  There was concern about fungal meningitis.  ID consulted and he was started on Amphotericin.  There was some improvement in mentation.  Had repeat LP on 4/25 that showed 34 cm water opening pressure.  CSF study unrevealing.  He also had CT chest, abdomen and pelvis that was negative for malignancy.  Neurosurgery indicated that patient is poor candidate for shunt placement on 4/30.  Neurology has reassessed patient on 5/2 with concern for CJD.  Neurology recommended brain biopsy which is delayed until next week due to logistic issue.     Subjective: Seen and examined earlier this morning.  No major events overnight of this morning.  Awake and alert but minimally verbal.  Follows commands.  Does not appear to be in distress.  Objective: Vitals:   10/26/22 1943 10/27/22 0518 10/27/22 0903 10/27/22 1317  BP: 116/80 (!) 152/94 134/87 116/76  Pulse: 77 94 94 83  Resp: 19 18 19 18   Temp: 98.4 F (36.9 C) 98.4 F (36.9 C) 98 F (36.7 C) 98.3 F (36.8 C)  TempSrc:   Oral   SpO2: 98% 99% 100% 100%  Weight:      Height:        Examination:  GENERAL: No apparent distress.  Nontoxic. HEENT: MMM.  Vision and hearing grossly intact.  NECK:  Supple.  No apparent JVD.  RESP:  No IWOB.  Fair aeration bilaterally. CVS:  RRR. Heart sounds normal.  ABD/GI/GU: BS+. Abd soft.  Some TTP MSK/EXT:   No apparent deformity. Moves extremities. No edema.  SKIN: no apparent skin lesion or wound NEURO: Awake and alert.  Minimally verbal.  Follows some commands but not consistent.  PSYCH: Calm. Normal affect.   Procedures:  4/17 LP  4/25 LP   Assessment and plan: Principal Problem:   Suspected fungal meningitis Active Problems:   Progressive subacute encephalopathy   Chronic pain syndrome   Depression   Anxiety   Benign essential hypertension   Diabetes mellitus type 2, controlled, without complications (HCC)   Hyperlipidemia   Acute kidney injury (HCC)   Folate deficiency   Transaminitis   Obesity (BMI 30-39.9)   Hypokalemia   Dysphagia   Vomiting   Altered mental status   Fecal impaction (HCC)   Abnormal CT scan, colon  Progressive subacute encephalopathy: Unclear etiology despite extensive workup.  The probably did not improve with high-dose thiamine.  Some concern about fungal meningitis at some point but felt to be less likely.  Currently awake and alert.  Minimally verbal.  Follows some commands but not consistently. -Neurology recommended brain biopsy to rule out CJD.   -Neurosurgery consulted.  Brain biopsy delayed until the week of 5/13 due to logistic issues. 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  H&H relatively stable Recent Labs    10/20/22 0425 10/21/22 0230 10/21/22 0712 10/21/22 1212 10/21/22 1749 10/22/22 0047 10/23/22 0846 10/24/22 0326 10/25/22 0800 10/26/22 0650  HGB 11.5* 11.4* 11.3* 11.3* 11.7* 9.9* 10.9* 9.8* 11.5* 11.3*  -Continue monitoring -Continue folic acid supplementation -No indication or plan for colonoscopy per GI  Uncontrolled IDDM-2 with hyperglycemia: A1c 9.7% on 3/28.  On 70/30 insulin at home. Recent Labs  Lab  10/26/22 1254 10/26/22 1616 10/26/22 2250 10/27/22 0757 10/27/22 1233  GLUCAP 205* 147* 121* 214* 126*  -Increase Semglee from 30 units daily to 35 units  -Continue NovoLog 6 units 3 times daily with meals. -Continue SSI-moderate -Further adjustment as appropriate  Hypokalemia/hypomagnesemia -Monitor replenish as appropriate   Chronic pain syndrome -Continue methadone 10 mg 3 times daily   Transaminitis: Likely NASH given normal [place].     Fecal impaction/constipation: Fecal impaction addressed earlier during hospitalization.  Now with abdominal tenderness likely from constipation. -Increase bowel regimen -KUB   Benign essential hypertension -Continue amlodipine -Hydralazine as needed   Folate deficiency  -Continue folate supplement.   Anxiety: Stable.   Goals of care discussion.  Palliative care on board.   Pancreatic insufficiency -Continue on Creon  Obesity Body mass index is 32.28 kg/m.           DVT prophylaxis:  SCDs Start: 10/21/22 0604 Place and maintain sequential compression device Start: 10/02/22 1610  Code Status: Full code Family Communication: None at bedside Level of care: Med-Surg Status is: Inpatient Remains inpatient appropriate because: Due to encephalopathy   Final disposition: TBD Consultants:  Neurology Neurosurgery Infectious disease  35 minutes with more than 50% spent in reviewing records, counseling patient/family and coordinating care.   Sch Meds:  Scheduled Meds:  amLODipine  10 mg Oral Daily   feeding supplement (GLUCERNA SHAKE)  237 mL Oral TID BM   folic acid  1 mg Oral Daily   insulin aspart  0-15 Units Subcutaneous TID WC   insulin aspart  0-5 Units Subcutaneous QHS   insulin aspart  6 Units Subcutaneous TID WC   insulin glargine-yfgn  35 Units Subcutaneous Daily   lidocaine (PF)  5 mL Other Once   lipase/protease/amylase  36,000 Units Oral TID AC   methadone  10 mg Oral TID   pantoprazole (PROTONIX) IV  40  mg Intravenous Q12H   polyethylene glycol  17 g Oral BID   sodium chloride flush  3 mL Intravenous Q12H   Continuous Infusions: PRN Meds:.acetaminophen, acetaminophen, ondansetron **OR** ondansetron (ZOFRAN) IV, [COMPLETED] senna-docusate **FOLLOWED BY** senna-docusate  Antimicrobials: Anti-infectives (From admission, onward)    Start     Dose/Rate Route Frequency Ordered Stop   10/12/22 0700  vancomycin (VANCOREADY) IVPB 750 mg/150 mL  Status:  Discontinued        750 mg 150 mL/hr over 60 Minutes Intravenous Every 12 hours 10/11/22 1811 10/12/22 1011   10/11/22 2200  cefTRIAXone (ROCEPHIN) 2 g in sodium chloride 0.9 % 100 mL IVPB  Status:  Discontinued        2 g 200 mL/hr over 30 Minutes Intravenous Every 12 hours 10/11/22 1749 10/13/22 2008   10/11/22 2000  ampicillin (OMNIPEN) 2 g in sodium chloride 0.9 % 100 mL IVPB  Status:  Discontinued        2 g 300 mL/hr over 20 Minutes Intravenous Every 4 hours 10/11/22 1752 10/12/22 1011   10/11/22 1845  cefTRIAXone (ROCEPHIN) 2 g in  sodium chloride 0.9 % 100 mL IVPB  Status:  Discontinued        2 g 200 mL/hr over 30 Minutes Intravenous Every 24 hours 10/11/22 1746 10/11/22 1749   10/11/22 1845  vancomycin (VANCOREADY) IVPB 1750 mg/350 mL        1,750 mg 175 mL/hr over 120 Minutes Intravenous  Once 10/11/22 1754 10/11/22 2333   10/04/22 1500  amphotericin B liposome (AMBISOME) 500 mg in dextrose 5 % 500 mL IVPB  Status:  Discontinued        500 mg 312.5 mL/hr over 120 Minutes Intravenous Every 24 hours 10/04/22 1159 10/13/22 0904        I have personally reviewed the following labs and images: CBC: Recent Labs  Lab 10/22/22 0047 10/23/22 0846 10/24/22 0326 10/25/22 0800 10/26/22 0650  WBC 10.2 13.0* 13.2* 13.4* 13.9*  HGB 9.9* 10.9* 9.8* 11.5* 11.3*  HCT 30.4* 32.5* 29.1* 34.2* 34.1*  MCV 87.1 86.0 85.6 84.2 86.1  PLT 128* 172 156 PLATELET CLUMPS NOTED ON SMEAR, UNABLE TO ESTIMATE 194   BMP &GFR Recent Labs  Lab  10/21/22 0230 10/22/22 0047 10/23/22 0846 10/24/22 0326 10/25/22 0800 10/26/22 0650  NA 144 142 145 138 140 139  K 2.8* 4.1 3.0* 3.7 3.2* 3.6  CL 113* 112* 110 106 104 104  CO2 23 22 23 25 26 26   GLUCOSE 340* 510* 240* 322* 238* 149*  BUN 32* 28* 19 15 12 12   CREATININE 1.20 1.20 0.97 1.00 0.88 1.05  CALCIUM 7.9* 7.9* 8.1* 8.0* 8.4* 8.8*  MG 1.9 1.8  --  1.6* 1.7 1.7  PHOS  --   --   --   --   --  4.0   Estimated Creatinine Clearance: 78.3 mL/min (by C-G formula based on SCr of 1.05 mg/dL). Liver & Pancreas: Recent Labs  Lab 10/25/22 0800 10/26/22 0650  AST 35  --   ALT 177*  --   ALKPHOS 96  --   BILITOT 0.7  --   PROT 5.4*  --   ALBUMIN 2.7* 2.7*   No results for input(s): "LIPASE", "AMYLASE" in the last 168 hours. No results for input(s): "AMMONIA" in the last 168 hours. Diabetic: No results for input(s): "HGBA1C" in the last 72 hours. Recent Labs  Lab 10/26/22 1254 10/26/22 1616 10/26/22 2250 10/27/22 0757 10/27/22 1233  GLUCAP 205* 147* 121* 214* 126*   Cardiac Enzymes: No results for input(s): "CKTOTAL", "CKMB", "CKMBINDEX", "TROPONINI" in the last 168 hours. No results for input(s): "PROBNP" in the last 8760 hours. Coagulation Profile: No results for input(s): "INR", "PROTIME" in the last 168 hours. Thyroid Function Tests: No results for input(s): "TSH", "T4TOTAL", "FREET4", "T3FREE", "THYROIDAB" in the last 72 hours. Lipid Profile: No results for input(s): "CHOL", "HDL", "LDLCALC", "TRIG", "CHOLHDL", "LDLDIRECT" in the last 72 hours. Anemia Panel: Recent Labs    10/25/22 0800  VITAMINB12 453  FOLATE 13.0  FERRITIN 1,132*  TIBC 190*  IRON 105  RETICCTPCT 1.9   Urine analysis:    Component Value Date/Time   COLORURINE AMBER (A) 09/28/2022 1931   APPEARANCEUR HAZY (A) 09/28/2022 1931   LABSPEC 1.020 09/28/2022 1931   PHURINE 5.0 09/28/2022 1931   GLUCOSEU >=500 (A) 09/28/2022 1931   HGBUR LARGE (A) 09/28/2022 1931   BILIRUBINUR NEGATIVE  09/28/2022 1931   BILIRUBINUR negative 04/27/2019 1559   KETONESUR 5 (A) 09/28/2022 1931   PROTEINUR 30 (A) 09/28/2022 1931   UROBILINOGEN 0.2 04/27/2019 1559   UROBILINOGEN 0.2 03/16/2015  1501   NITRITE NEGATIVE 09/28/2022 1931   LEUKOCYTESUR NEGATIVE 09/28/2022 1931   Sepsis Labs: Invalid input(s): "PROCALCITONIN", "LACTICIDVEN"  Microbiology: No results found for this or any previous visit (from the past 240 hour(s)).  Radiology Studies: No results found.    Ranae Casebier T. Ashleah Valtierra [facility]  If 7PM-7AM, please contact night-coverage [email or web address] 10/27/2022, 3:04 PM

## 2022-10-28 DIAGNOSIS — E119 Type 2 diabetes mellitus without complications: Secondary | ICD-10-CM | POA: Diagnosis not present

## 2022-10-28 DIAGNOSIS — E538 Deficiency of other specified B group vitamins: Secondary | ICD-10-CM | POA: Diagnosis not present

## 2022-10-28 DIAGNOSIS — N179 Acute kidney failure, unspecified: Secondary | ICD-10-CM | POA: Diagnosis not present

## 2022-10-28 DIAGNOSIS — G030 Nonpyogenic meningitis: Secondary | ICD-10-CM | POA: Diagnosis not present

## 2022-10-28 LAB — GLUCOSE, CAPILLARY
Glucose-Capillary: 232 mg/dL — ABNORMAL HIGH (ref 70–99)
Glucose-Capillary: 243 mg/dL — ABNORMAL HIGH (ref 70–99)
Glucose-Capillary: 162 mg/dL — ABNORMAL HIGH (ref 70–99)
Glucose-Capillary: 154 mg/dL — ABNORMAL HIGH (ref 70–99)

## 2022-10-28 MED ORDER — INSULIN ASPART 100 UNIT/ML IJ SOLN
0.0000 [IU] | Freq: Every day | INTRAMUSCULAR | Status: DC
  Administered 2022-10-30: 3 [IU] via SUBCUTANEOUS

## 2022-10-28 MED ORDER — INSULIN ASPART 100 UNIT/ML IJ SOLN
0.0000 [IU] | Freq: Three times a day (TID) | INTRAMUSCULAR | Status: DC
  Administered 2022-10-28 – 2022-11-01 (×6): 4 [IU] via SUBCUTANEOUS
  Administered 2022-11-01: 3 [IU] via SUBCUTANEOUS

## 2022-10-28 MED ORDER — PANTOPRAZOLE SODIUM 40 MG PO TBEC
40.0000 mg | DELAYED_RELEASE_TABLET | Freq: Every day | ORAL | Status: DC
  Administered 2022-10-29 – 2022-11-02 (×4): 40 mg via ORAL
  Filled 2022-10-28 (×4): qty 1

## 2022-10-28 NOTE — Progress Notes (Signed)
PROGRESS NOTE  Jeremy Medina. WUJ:[id number] DOB: 1950/11/26   PCP: [facility]  Patient is from: Home  DOA: 09/28/2022 LOS: 26  Chief complaints Chief Complaint  Patient presents with   Weakness    Falls     Brief Narrative / Interim history: 72 year old M with PMH of DM-2, HTN, pancreatic insufficiency, GERD, chronic pain on methadone, PTSD and anxiety sent to ED from neurosurgery clinic for progressive subacute encephalopathy on 4/12.  Patient was recently hospitalized at the [facility] for subacute encephalopathy 3 months back and was thought to have NPH.  He was discharged home with neurology and neurosurgery follow-up.  CT head and MRV without acute finding.  Neurology consulted.  No improvement with IV thiamine.  Underwent LP on 4/17.  There was concern about fungal meningitis.  ID consulted and he was started on Amphotericin.  There was some improvement in mentation.  Had repeat LP on 4/25 that showed 34 cm water opening pressure.  CSF study unrevealing.  He also had CT chest, abdomen and pelvis that was negative for malignancy.  Neurosurgery indicated that patient is poor candidate for shunt placement on 4/30.  Neurology has reassessed patient on 5/2 with concern for CJD.  Neurology recommended brain biopsy which is delayed until next week due to logistic issue.     Subjective: Seen and examined earlier this morning.  No major events overnight of this morning.  Awake and alert but minimally verbal.  Follows commands.  Does not appear to be in distress.  Objective: Vitals:   10/27/22 1317 10/27/22 1615 10/27/22 2001 10/28/22 0854  BP: 116/76 131/79 131/83 (!) 142/83  Pulse: 83 85 86 81  Resp: 18 17 16 16   Temp: 98.3 F (36.8 C) 97.9 F (36.6 C) 98.2 F (36.8 C) 98.1 F (36.7 C)  TempSrc:  Oral  Oral  SpO2: 100% 100% 99% 99%  Weight:      Height:        Examination:  GENERAL: No apparent distress.  Nontoxic. HEENT: MMM.  Vision and hearing grossly intact.   NECK: Supple.  No apparent JVD.  RESP:  No IWOB.  Fair aeration bilaterally. CVS:  RRR. Heart sounds normal.  ABD/GI/GU: BS+. Abd soft.  Some TTP MSK/EXT:   No apparent deformity. Moves extremities. No edema.  SKIN: no apparent skin lesion or wound NEURO: Awake and alert.  Minimally verbal.  Follows some commands but not consistent.  PSYCH: Calm. Normal affect.   Procedures:  4/17 LP  4/25 LP   Assessment and plan: Principal Problem:   Suspected fungal meningitis Active Problems:   Progressive subacute encephalopathy   Chronic pain syndrome   Depression   Anxiety   Benign essential hypertension   Diabetes mellitus type 2, controlled, without complications (HCC)   Hyperlipidemia   Acute kidney injury (HCC)   Folate deficiency   Transaminitis   Obesity (BMI 30-39.9)   Hypokalemia   Dysphagia   Vomiting   Altered mental status   Fecal impaction (HCC)   Abnormal CT scan, colon  Progressive subacute encephalopathy: Unclear etiology despite extensive workup.  The probably did not improve with high-dose thiamine.  Some concern about fungal meningitis at some point but felt to be less likely.  Currently awake and alert.  Minimally verbal.  Follows some commands but not consistently. -Neurology recommended brain biopsy to rule out CJD.   -Neurosurgery consulted.  Brain biopsy delayed until the week of 5/13 due to logistic issues. 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  H&H relatively stable Recent Labs    10/20/22 0425 10/21/22 0230 10/21/22 0712 10/21/22 1212 10/21/22 1749 10/22/22 0047 10/23/22 0846 10/24/22 0326 10/25/22 0800 10/26/22 0650  HGB 11.5* 11.4* 11.3* 11.3* 11.7* 9.9* 10.9* 9.8* 11.5* 11.3*  -Continue monitoring -Continue folic acid supplementation -No indication or plan for colonoscopy per GI  Uncontrolled IDDM-2 with hyperglycemia: A1c 9.7% on 3/28.  On 70/30 insulin at home. Recent Labs   Lab 10/27/22 1233 10/27/22 1611 10/27/22 2126 10/28/22 0856 10/28/22 1208  GLUCAP 126* 112* 388* 232* 243*  -Increase Semglee from 30 units daily to 35 units  -Continue NovoLog 6 units 3 times daily with meals. -Increase SSI to resistant. -Further adjustment as appropriate  Hypokalemia/hypomagnesemia -Monitor replenish as appropriate   Chronic pain syndrome -Continue methadone 10 mg 3 times daily   Transaminitis: Likely NASH given normal [place].     Fecal impaction/constipation: Fecal impaction addressed earlier during hospitalization.  Now with abdominal tenderness likely from constipation. -Increase bowel regimen -KUB   Benign essential hypertension -Continue amlodipine -Hydralazine as needed   Folate deficiency  -Continue folate supplement.   Anxiety: Stable.   Goals of care discussion.  Palliative care on board.   Pancreatic insufficiency -Continue on Creon  Obesity Body mass index is 32.28 kg/m.           DVT prophylaxis:  SCDs Start: 10/21/22 0604 Place and maintain sequential compression device Start: 10/02/22 1610  Code Status: Full code Family Communication: None at bedside Level of care: Med-Surg Status is: Inpatient Remains inpatient appropriate because: Due to encephalopathy   Final disposition: TBD Consultants:  Neurology Neurosurgery Infectious disease  35 minutes with more than 50% spent in reviewing records, counseling patient/family and coordinating care.   Sch Meds:  Scheduled Meds:  amLODipine  10 mg Oral Daily   feeding supplement (GLUCERNA SHAKE)  237 mL Oral TID BM   folic acid  1 mg Oral Daily   insulin aspart  0-20 Units Subcutaneous TID WC   insulin aspart  0-5 Units Subcutaneous QHS   insulin aspart  6 Units Subcutaneous TID WC   insulin glargine-yfgn  35 Units Subcutaneous Daily   lidocaine (PF)  5 mL Other Once   lipase/protease/amylase  36,000 Units Oral TID AC   methadone  10 mg Oral TID   pantoprazole  (PROTONIX) IV  40 mg Intravenous Q12H   polyethylene glycol  17 g Oral BID   sodium chloride flush  3 mL Intravenous Q12H   Continuous Infusions: PRN Meds:.acetaminophen, acetaminophen, ondansetron **OR** ondansetron (ZOFRAN) IV, [COMPLETED] senna-docusate **FOLLOWED BY** senna-docusate  Antimicrobials: Anti-infectives (From admission, onward)    Start     Dose/Rate Route Frequency Ordered Stop   10/12/22 0700  vancomycin (VANCOREADY) IVPB 750 mg/150 mL  Status:  Discontinued        750 mg 150 mL/hr over 60 Minutes Intravenous Every 12 hours 10/11/22 1811 10/12/22 1011   10/11/22 2200  cefTRIAXone (ROCEPHIN) 2 g in sodium chloride 0.9 % 100 mL IVPB  Status:  Discontinued        2 g 200 mL/hr over 30 Minutes Intravenous Every 12 hours 10/11/22 1749 10/13/22 2008   10/11/22 2000  ampicillin (OMNIPEN) 2 g in sodium chloride 0.9 % 100 mL IVPB  Status:  Discontinued        2 g 300 mL/hr over 20 Minutes Intravenous Every 4 hours 10/11/22 1752 10/12/22 1011   10/11/22 1845  cefTRIAXone (ROCEPHIN) 2  g in sodium chloride 0.9 % 100 mL IVPB  Status:  Discontinued        2 g 200 mL/hr over 30 Minutes Intravenous Every 24 hours 10/11/22 1746 10/11/22 1749   10/11/22 1845  vancomycin (VANCOREADY) IVPB 1750 mg/350 mL        1,750 mg 175 mL/hr over 120 Minutes Intravenous  Once 10/11/22 1754 10/11/22 2333   10/04/22 1500  amphotericin B liposome (AMBISOME) 500 mg in dextrose 5 % 500 mL IVPB  Status:  Discontinued        500 mg 312.5 mL/hr over 120 Minutes Intravenous Every 24 hours 10/04/22 1159 10/13/22 0904        I have personally reviewed the following labs and images: CBC: Recent Labs  Lab 10/22/22 0047 10/23/22 0846 10/24/22 0326 10/25/22 0800 10/26/22 0650  WBC 10.2 13.0* 13.2* 13.4* 13.9*  HGB 9.9* 10.9* 9.8* 11.5* 11.3*  HCT 30.4* 32.5* 29.1* 34.2* 34.1*  MCV 87.1 86.0 85.6 84.2 86.1  PLT 128* 172 156 PLATELET CLUMPS NOTED ON SMEAR, UNABLE TO ESTIMATE 194   BMP &GFR Recent  Labs  Lab 10/22/22 0047 10/23/22 0846 10/24/22 0326 10/25/22 0800 10/26/22 0650  NA 142 145 138 140 139  K 4.1 3.0* 3.7 3.2* 3.6  CL 112* 110 106 104 104  CO2 22 23 25 26 26   GLUCOSE 510* 240* 322* 238* 149*  BUN 28* 19 15 12 12   CREATININE 1.20 0.97 1.00 0.88 1.05  CALCIUM 7.9* 8.1* 8.0* 8.4* 8.8*  MG 1.8  --  1.6* 1.7 1.7  PHOS  --   --   --   --  4.0   Estimated Creatinine Clearance: 78.3 mL/min (by C-G formula based on SCr of 1.05 mg/dL). Liver & Pancreas: Recent Labs  Lab 10/25/22 0800 10/26/22 0650  AST 35  --   ALT 177*  --   ALKPHOS 96  --   BILITOT 0.7  --   PROT 5.4*  --   ALBUMIN 2.7* 2.7*   No results for input(s): "LIPASE", "AMYLASE" in the last 168 hours. No results for input(s): "AMMONIA" in the last 168 hours. Diabetic: No results for input(s): "HGBA1C" in the last 72 hours. Recent Labs  Lab 10/27/22 1233 10/27/22 1611 10/27/22 2126 10/28/22 0856 10/28/22 1208  GLUCAP 126* 112* 388* 232* 243*   Cardiac Enzymes: No results for input(s): "CKTOTAL", "CKMB", "CKMBINDEX", "TROPONINI" in the last 168 hours. No results for input(s): "PROBNP" in the last 8760 hours. Coagulation Profile: No results for input(s): "INR", "PROTIME" in the last 168 hours. Thyroid Function Tests: No results for input(s): "TSH", "T4TOTAL", "FREET4", "T3FREE", "THYROIDAB" in the last 72 hours. Lipid Profile: No results for input(s): "CHOL", "HDL", "LDLCALC", "TRIG", "CHOLHDL", "LDLDIRECT" in the last 72 hours. Anemia Panel: No results for input(s): "VITAMINB12", "FOLATE", "FERRITIN", "TIBC", "IRON", "RETICCTPCT" in the last 72 hours.  Urine analysis:    Component Value Date/Time   COLORURINE AMBER (A) 09/28/2022 1931   APPEARANCEUR HAZY (A) 09/28/2022 1931   LABSPEC 1.020 09/28/2022 1931   PHURINE 5.0 09/28/2022 1931   GLUCOSEU >=500 (A) 09/28/2022 1931   HGBUR LARGE (A) 09/28/2022 1931   BILIRUBINUR NEGATIVE 09/28/2022 1931   BILIRUBINUR negative 04/27/2019 1559    KETONESUR 5 (A) 09/28/2022 1931   PROTEINUR 30 (A) 09/28/2022 1931   UROBILINOGEN 0.2 04/27/2019 1559   UROBILINOGEN 0.2 03/16/2015 1501   NITRITE NEGATIVE 09/28/2022 1931   LEUKOCYTESUR NEGATIVE 09/28/2022 1931   Sepsis Labs: Invalid input(s): "PROCALCITONIN", "LACTICIDVEN"  Microbiology:  No results found for this or any previous visit (from the past 240 hour(s)).  Radiology Studies: No results found.    Kolbee Stallman T. Nancye Grumbine [facility]  If 7PM-7AM, please contact night-coverage [email or web address] 10/28/2022, 1:24 PM

## 2022-10-28 NOTE — Plan of Care (Signed)

## 2022-10-29 ENCOUNTER — Encounter (HOSPITAL_COMMUNITY): Payer: Self-pay | Admitting: Anesthesiology

## 2022-10-29 ENCOUNTER — Inpatient Hospital Stay (HOSPITAL_COMMUNITY): Payer: No Typology Code available for payment source

## 2022-10-29 DIAGNOSIS — E538 Deficiency of other specified B group vitamins: Secondary | ICD-10-CM | POA: Diagnosis not present

## 2022-10-29 DIAGNOSIS — N179 Acute kidney failure, unspecified: Secondary | ICD-10-CM | POA: Diagnosis not present

## 2022-10-29 DIAGNOSIS — G030 Nonpyogenic meningitis: Secondary | ICD-10-CM | POA: Diagnosis not present

## 2022-10-29 DIAGNOSIS — E119 Type 2 diabetes mellitus without complications: Secondary | ICD-10-CM | POA: Diagnosis not present

## 2022-10-29 LAB — GLUCOSE, CAPILLARY
Glucose-Capillary: 123 mg/dL — ABNORMAL HIGH (ref 70–99)
Glucose-Capillary: 131 mg/dL — ABNORMAL HIGH (ref 70–99)
Glucose-Capillary: 159 mg/dL — ABNORMAL HIGH (ref 70–99)
Glucose-Capillary: 175 mg/dL — ABNORMAL HIGH (ref 70–99)
Glucose-Capillary: 75 mg/dL (ref 70–99)
Glucose-Capillary: 83 mg/dL (ref 70–99)
Glucose-Capillary: 92 mg/dL (ref 70–99)

## 2022-10-29 MED ORDER — ACETAMINOPHEN 500 MG PO TABS: 1000.0000 mg | ORAL_TABLET | Freq: Once | ORAL | Status: DC

## 2022-10-29 MED ORDER — POLYETHYLENE GLYCOL 3350 17 G PO PACK: 17.0000 g | PACK | Freq: Two times a day (BID) | ORAL | Status: DC | PRN

## 2022-10-29 MED ORDER — DEXTROSE 5 % IV SOLN: INTRAVENOUS | Status: DC

## 2022-10-29 NOTE — Progress Notes (Addendum)
Family updated on negative CJD result   Discussed with neurosurgery  Posted for OR for 1 PM tomorrow CT DBS protocol for presurgical planning NPO at midnight ordered  Brooke Dare MD-PhD [facility] [phone] Available 7 AM to 7 PM, outside these hours please contact Neurologist on call listed on AMION

## 2022-10-29 NOTE — Progress Notes (Signed)
Subjective: No significant changes  Exam: Vitals:   10/29/22 0429 10/29/22 0747  BP: 134/71 114/69  Pulse: 72 83  Resp: 14 18  Temp: (!) 97.5 F (36.4 C) (!) 97.4 F (36.3 C)  SpO2: 98% 100%   Gen: In bed, NAD Resp: non-labored breathing, no acute distress Abd: soft, nt  Neuro: MS: Awake, initially says "I'm doing alright" when asked, but proceeds to not answer any other questions or follow commands CN: Visual fields full, tracks across midline in both directions, face symmetric Motor: Moves all extremities relatively symmetrically Sensory: Responds to mild stimulation bilaterally   Pertinent Labs: Creatinine 1.0  Impression: 72 year old male who presented with progressive encephalopathy who has undergone extensive workup for infectious, autoimmune, malignant etiologies, to LPs with increased protein and WBC, but thus far no clear etiology has been discovered.  Protein 14 3 3/tau/RT quic testing has been sent, but with the presence of enhancing lesions as well as pleocytosis on LP I think that CJD is exceedingly unlikely unless he has another comorbid illness causing the imaging findings.  I do think that he has some sort of destructive brain process given the ring-enhancing lesion and therefore 14 3 3  and tau are very likely to be positive, but the rt-quic should be negative.  We are calling [facility] daily and expect the prion testing results to be available within the next few days.  As soon as results are available appreciate proceeding with biopsy.   Recommendations: 1) appreciate neurosurgical assistance 2) neurology will follow, discussed with Dr. Drue Second of infectious disease via phone   Patient seen and examined by NP/APP with MD. MD to update note as needed.   Elmer Picker, DNP, FNP-BC [facility] Pager: [phone]  Attending Neurologist's note:  I personally saw this patient, gathering history, performing a full neurologic examination,  reviewing relevant labs, personally reviewing relevant imaging, and formulated the assessment and plan, adding the note above for completeness and clarity to accurately reflect my thoughts  Brooke Dare MD-PhD [facility] [phone] Available 7 AM to 7 PM, outside these hours please contact Neurologist on call listed on AMION

## 2022-10-29 NOTE — Progress Notes (Addendum)
PROGRESS NOTE  Jeremy Medina. GNF:[id number] DOB: 10-29-1950   PCP: [facility]  Patient is from: Home  DOA: 09/28/2022 LOS: 27  Chief complaints Chief Complaint  Patient presents with   Weakness    Falls     Brief Narrative / Interim history: 72 year old M with PMH of DM-2, HTN, pancreatic insufficiency, GERD, chronic pain on methadone, PTSD and anxiety sent to ED from neurosurgery clinic for progressive subacute encephalopathy on 4/12.  Patient was recently hospitalized at the [facility] for subacute encephalopathy 3 months back and was thought to have NPH.  He was discharged home with neurology and neurosurgery follow-up.  CT head and MRV without acute finding.  Neurology consulted.  No improvement with IV thiamine. Had extensive work up including   LP on 4/17 and 4/25 that didn't pinpoint etiology.  CSF studies including CJD unrevealing.  He also had CT chest, abdomen and pelvis that was negative for malignancy.  Neurosurgery indicated that patient is poor candidate for shunt placement on 4/30.  Neurology recommended tissue biopsy that seems to be planned for 5/14.     Subjective: Seen and examined earlier this morning.  No major events overnight of this morning.  Awake and alert but minimally verbal.  Follows commands.  Does not appear to be in distress.  Objective: Vitals:   10/28/22 1649 10/28/22 1951 10/29/22 0429 10/29/22 0747  BP: 113/61 (!) 114/101 134/71 114/69  Pulse: 86 83 72 83  Resp: 16 16 14 18   Temp: 97.9 F (36.6 C) 98.4 F (36.9 C) (!) 97.5 F (36.4 C) (!) 97.4 F (36.3 C)  TempSrc:  Oral Oral Oral  SpO2: 97% 100% 98% 100%  Weight:      Height:        Examination:  GENERAL: No apparent distress.  Nontoxic. HEENT: MMM.  Vision and hearing grossly intact.  NECK: Supple.  No apparent JVD.  RESP:  No IWOB.  Fair aeration bilaterally. CVS:  RRR. Heart sounds normal.  ABD/GI/GU: BS+. Abd soft.  Some TTP MSK/EXT:   No apparent deformity. Moves  extremities. No edema.  SKIN: no apparent skin lesion or wound NEURO: Awake and alert.  Minimally verbal.  Follows some commands but not consistent.  PSYCH: Calm. Normal affect.   Procedures:  4/17 LP  4/25 LP   Assessment and plan: Principal Problem:   Suspected fungal meningitis Active Problems:   Progressive subacute encephalopathy   Chronic pain syndrome   Depression   Anxiety   Benign essential hypertension   Diabetes mellitus type 2, controlled, without complications (HCC)   Hyperlipidemia   Acute kidney injury (HCC)   Folate deficiency   Transaminitis   Obesity (BMI 30-39.9)   Hypokalemia   Dysphagia   Vomiting   Altered mental status   Fecal impaction (HCC)   Abnormal CT scan, colon  Progressive subacute encephalopathy: Unclear etiology despite extensive workup.  The probably did not improve with high-dose thiamine.  Some concern about fungal meningitis at some point but felt to be less likely.  Currently awake and alert.  Minimally verbal.  Follows some commands but not consistently. 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  H&H relatively stable Recent Labs    10/20/22 0425 10/21/22 0230 10/21/22 0712 10/21/22 1212 10/21/22 1749 10/22/22 0047 10/23/22 0846 10/24/22 0326 10/25/22 0800 10/26/22 0650  HGB 11.5* 11.4* 11.3* 11.3* 11.7* 9.9* 10.9* 9.8* 11.5* 11.3*  -  Continue monitoring -Continue folic acid supplementation -No indication or plan for colonoscopy per GI  Uncontrolled IDDM-2 with hyperglycemia: A1c 9.7% on 3/28.  On 70/30 insulin at home. Recent Labs  Lab 10/28/22 1651 10/28/22 1954 10/29/22 0049 10/29/22 0432 10/29/22 0749  GLUCAP 162* 154* 131* 159* 175*  -Continue Semglee 35 units daily -Continue NovoLog 6 units 3 times daily with meals. -Continue SSI resistant scale. -Further adjustment as  appropriate  Hypokalemia/hypomagnesemia -Monitor replenish as appropriate   Chronic pain syndrome -Continue methadone 10 mg 3 times daily   Transaminitis: Likely NASH given normal [place].     Fecal impaction/constipation: Fecal impaction solved. -Continue bowel regimen.   Benign essential hypertension -Continue amlodipine -Hydralazine as needed   Folate deficiency  -Continue folate supplement.   Anxiety: Stable.   Goals of care discussion.  Palliative care on board.   Pancreatic insufficiency -Continue on Creon  Obesity Body mass index is 32.28 kg/m.           DVT prophylaxis:  SCDs Start: 10/21/22 0604 Place and maintain sequential compression device Start: 10/02/22 5956  Code Status: Full code Family Communication: None at bedside Level of care: Med-Surg Status is: Inpatient Remains inpatient appropriate because: Due to encephalopathy   Final disposition: TBD Consultants:  Neurology Neurosurgery Infectious disease  35 minutes with more than 50% spent in reviewing records, counseling patient/family and coordinating care.   Sch Meds:  Scheduled Meds:  amLODipine  10 mg Oral Daily   feeding supplement (GLUCERNA SHAKE)  237 mL Oral TID BM   folic acid  1 mg Oral Daily   insulin aspart  0-20 Units Subcutaneous TID WC   insulin aspart  0-5 Units Subcutaneous QHS   insulin aspart  6 Units Subcutaneous TID WC   insulin glargine-yfgn  35 Units Subcutaneous Daily   lidocaine (PF)  5 mL Other Once   lipase/protease/amylase  36,000 Units Oral TID AC   methadone  10 mg Oral TID   pantoprazole  40 mg Oral Daily   polyethylene glycol  17 g Oral BID   sodium chloride flush  3 mL Intravenous Q12H   Continuous Infusions: PRN Meds:.acetaminophen, acetaminophen, ondansetron **OR** ondansetron (ZOFRAN) IV, [COMPLETED] senna-docusate **FOLLOWED BY** senna-docusate  Antimicrobials: Anti-infectives (From admission, onward)    Start     Dose/Rate Route Frequency  Ordered Stop   10/12/22 0700  vancomycin (VANCOREADY) IVPB 750 mg/150 mL  Status:  Discontinued        750 mg 150 mL/hr over 60 Minutes Intravenous Every 12 hours 10/11/22 1811 10/12/22 1011   10/11/22 2200  cefTRIAXone (ROCEPHIN) 2 g in sodium chloride 0.9 % 100 mL IVPB  Status:  Discontinued        2 g 200 mL/hr over 30 Minutes Intravenous Every 12 hours 10/11/22 1749 10/13/22 2008   10/11/22 2000  ampicillin (OMNIPEN) 2 g in sodium chloride 0.9 % 100 mL IVPB  Status:  Discontinued        2 g 300 mL/hr over 20 Minutes Intravenous Every 4 hours 10/11/22 1752 10/12/22 1011   10/11/22 1845  cefTRIAXone (ROCEPHIN) 2 g in sodium chloride 0.9 % 100 mL IVPB  Status:  Discontinued        2 g 200 mL/hr over 30 Minutes Intravenous Every 24 hours 10/11/22 1746 10/11/22 1749   10/11/22 1845  vancomycin (VANCOREADY) IVPB 1750 mg/350 mL        1,750 mg 175 mL/hr over 120 Minutes Intravenous  Once 10/11/22 1754 10/11/22 2333  10/04/22 1500  amphotericin B liposome (AMBISOME) 500 mg in dextrose 5 % 500 mL IVPB  Status:  Discontinued        500 mg 312.5 mL/hr over 120 Minutes Intravenous Every 24 hours 10/04/22 1159 10/13/22 0904        I have personally reviewed the following labs and images: CBC: Recent Labs  Lab 10/23/22 0846 10/24/22 0326 10/25/22 0800 10/26/22 0650  WBC 13.0* 13.2* 13.4* 13.9*  HGB 10.9* 9.8* 11.5* 11.3*  HCT 32.5* 29.1* 34.2* 34.1*  MCV 86.0 85.6 84.2 86.1  PLT 172 156 PLATELET CLUMPS NOTED ON SMEAR, UNABLE TO ESTIMATE 194   BMP &GFR Recent Labs  Lab 10/23/22 0846 10/24/22 0326 10/25/22 0800 10/26/22 0650  NA 145 138 140 139  K 3.0* 3.7 3.2* 3.6  CL 110 106 104 104  CO2 23 25 26 26   GLUCOSE 240* 322* 238* 149*  BUN 19 15 12 12   CREATININE 0.97 1.00 0.88 1.05  CALCIUM 8.1* 8.0* 8.4* 8.8*  MG  --  1.6* 1.7 1.7  PHOS  --   --   --  4.0   Estimated Creatinine Clearance: 78.3 mL/min (by C-G formula based on SCr of 1.05 mg/dL). Liver & Pancreas: Recent  Labs  Lab 10/25/22 0800 10/26/22 0650  AST 35  --   ALT 177*  --   ALKPHOS 96  --   BILITOT 0.7  --   PROT 5.4*  --   ALBUMIN 2.7* 2.7*   No results for input(s): "LIPASE", "AMYLASE" in the last 168 hours. No results for input(s): "AMMONIA" in the last 168 hours. Diabetic: No results for input(s): "HGBA1C" in the last 72 hours. Recent Labs  Lab 10/28/22 1651 10/28/22 1954 10/29/22 0049 10/29/22 0432 10/29/22 0749  GLUCAP 162* 154* 131* 159* 175*   Cardiac Enzymes: No results for input(s): "CKTOTAL", "CKMB", "CKMBINDEX", "TROPONINI" in the last 168 hours. No results for input(s): "PROBNP" in the last 8760 hours. Coagulation Profile: No results for input(s): "INR", "PROTIME" in the last 168 hours. Thyroid Function Tests: No results for input(s): "TSH", "T4TOTAL", "FREET4", "T3FREE", "THYROIDAB" in the last 72 hours. Lipid Profile: No results for input(s): "CHOL", "HDL", "LDLCALC", "TRIG", "CHOLHDL", "LDLDIRECT" in the last 72 hours. Anemia Panel: No results for input(s): "VITAMINB12", "FOLATE", "FERRITIN", "TIBC", "IRON", "RETICCTPCT" in the last 72 hours.  Urine analysis:    Component Value Date/Time   COLORURINE AMBER (A) 09/28/2022 1931   APPEARANCEUR HAZY (A) 09/28/2022 1931   LABSPEC 1.020 09/28/2022 1931   PHURINE 5.0 09/28/2022 1931   GLUCOSEU >=500 (A) 09/28/2022 1931   HGBUR LARGE (A) 09/28/2022 1931   BILIRUBINUR NEGATIVE 09/28/2022 1931   BILIRUBINUR negative 04/27/2019 1559   KETONESUR 5 (A) 09/28/2022 1931   PROTEINUR 30 (A) 09/28/2022 1931   UROBILINOGEN 0.2 04/27/2019 1559   UROBILINOGEN 0.2 03/16/2015 1501   NITRITE NEGATIVE 09/28/2022 1931   LEUKOCYTESUR NEGATIVE 09/28/2022 1931   Sepsis Labs: Invalid input(s): "PROCALCITONIN", "LACTICIDVEN"  Microbiology: No results found for this or any previous visit (from the past 240 hour(s)).  Radiology Studies: No results found.    Deijah Spikes T. Aleksandra Raben [facility]  If 7PM-7AM, please contact  night-coverage [email or web address] 10/29/2022, 11:00 AM

## 2022-10-29 NOTE — Progress Notes (Signed)
Due to logistical issues with tissue processing in the setting of possible CJD, neurosurgery team unable to perform biopsy tmrw. Per Dr. Drue Second, if CSF testing from prion lab is negative, we would be able to proceed with biopsy early next week.   Jeremy Medina, [facility]

## 2022-10-29 NOTE — Plan of Care (Signed)

## 2022-10-30 ENCOUNTER — Inpatient Hospital Stay (HOSPITAL_COMMUNITY): Payer: No Typology Code available for payment source | Admitting: Certified Registered Nurse Anesthetist

## 2022-10-30 ENCOUNTER — Encounter (HOSPITAL_COMMUNITY)
Admission: EM | Disposition: A | Discharge status: Hospice | Payer: Self-pay | Source: Home / Self Care | Attending: Internal Medicine

## 2022-10-30 DIAGNOSIS — I10 Essential (primary) hypertension: Secondary | ICD-10-CM | POA: Diagnosis not present

## 2022-10-30 DIAGNOSIS — F418 Other specified anxiety disorders: Secondary | ICD-10-CM | POA: Diagnosis not present

## 2022-10-30 DIAGNOSIS — G939 Disorder of brain, unspecified: Secondary | ICD-10-CM | POA: Diagnosis not present

## 2022-10-30 DIAGNOSIS — F17210 Nicotine dependence, cigarettes, uncomplicated: Secondary | ICD-10-CM

## 2022-10-30 DIAGNOSIS — E119 Type 2 diabetes mellitus without complications: Secondary | ICD-10-CM | POA: Diagnosis not present

## 2022-10-30 DIAGNOSIS — G030 Nonpyogenic meningitis: Secondary | ICD-10-CM | POA: Diagnosis not present

## 2022-10-30 DIAGNOSIS — E538 Deficiency of other specified B group vitamins: Secondary | ICD-10-CM | POA: Diagnosis not present

## 2022-10-30 DIAGNOSIS — N179 Acute kidney failure, unspecified: Secondary | ICD-10-CM | POA: Diagnosis not present

## 2022-10-30 HISTORY — PX: APPLICATION OF CRANIAL NAVIGATION: SHX6578

## 2022-10-30 HISTORY — PX: CRANIOTOMY: SHX93

## 2022-10-30 LAB — CBC
HCT: 35.7 % — ABNORMAL LOW (ref 39.0–52.0)
Hemoglobin: 11.6 g/dL — ABNORMAL LOW (ref 13.0–17.0)
MCH: 28.8 pg (ref 26.0–34.0)
MCHC: 32.5 g/dL (ref 30.0–36.0)
MCV: 88.6 fL (ref 80.0–100.0)
Platelets: 176 10*3/uL (ref 150–400)
RBC: 4.03 MIL/uL — ABNORMAL LOW (ref 4.22–5.81)
RDW: 16.8 % — ABNORMAL HIGH (ref 11.5–15.5)
WBC: 14.2 10*3/uL — ABNORMAL HIGH (ref 4.0–10.5)
nRBC: 0 % (ref 0.0–0.2)

## 2022-10-30 LAB — GLUCOSE, CAPILLARY
Glucose-Capillary: 129 mg/dL — ABNORMAL HIGH (ref 70–99)
Glucose-Capillary: 130 mg/dL — ABNORMAL HIGH (ref 70–99)
Glucose-Capillary: 171 mg/dL — ABNORMAL HIGH (ref 70–99)
Glucose-Capillary: 206 mg/dL — ABNORMAL HIGH (ref 70–99)
Glucose-Capillary: 211 mg/dL — ABNORMAL HIGH (ref 70–99)
Glucose-Capillary: 165 mg/dL — ABNORMAL HIGH (ref 70–99)
Glucose-Capillary: 196 mg/dL — ABNORMAL HIGH (ref 70–99)
Glucose-Capillary: 267 mg/dL — ABNORMAL HIGH (ref 70–99)

## 2022-10-30 LAB — BASIC METABOLIC PANEL
Anion gap: 11 (ref 5–15)
BUN: 20 mg/dL (ref 8–23)
CO2: 23 mmol/L (ref 22–32)
Calcium: 8.2 mg/dL — ABNORMAL LOW (ref 8.9–10.3)
Chloride: 104 mmol/L (ref 98–111)
Creatinine, Ser: 1.14 mg/dL (ref 0.61–1.24)
GFR, Estimated: >60 mL/min (ref >60)
Glucose, Bld: 332 mg/dL — ABNORMAL HIGH (ref 70–99)
Potassium: 3.4 mmol/L — ABNORMAL LOW (ref 3.5–5.1)
Sodium: 138 mmol/L (ref 135–145)

## 2022-10-30 LAB — TYPE AND SCREEN
ABO/RH(D): A NEG
Antibody Screen: NEGATIVE

## 2022-10-30 LAB — RENAL FUNCTION PANEL
Albumin: 3 g/dL — ABNORMAL LOW (ref 3.5–5.0)
Anion gap: 9 (ref 5–15)
BUN: 21 mg/dL (ref 8–23)
CO2: 26 mmol/L (ref 22–32)
Calcium: 8.9 mg/dL (ref 8.9–10.3)
Chloride: 103 mmol/L (ref 98–111)
Creatinine, Ser: 0.94 mg/dL (ref 0.61–1.24)
GFR, Estimated: >60 mL/min (ref >60)
Glucose, Bld: 119 mg/dL — ABNORMAL HIGH (ref 70–99)
Phosphorus: 3.9 mg/dL (ref 2.5–4.6)
Potassium: 3.3 mmol/L — ABNORMAL LOW (ref 3.5–5.1)
Sodium: 138 mmol/L (ref 135–145)

## 2022-10-30 LAB — MRSA NEXT GEN BY PCR, NASAL: MRSA by PCR Next Gen: NOT DETECTED

## 2022-10-30 LAB — MAGNESIUM: Magnesium: 1.9 mg/dL (ref 1.7–2.4)

## 2022-10-30 SURGERY — CRANIOTOMY, FOR ABSCESS DRAINAGE
Anesthesia: General | Laterality: Right

## 2022-10-30 SURGERY — FRAMELESS STEREOTACTIC BIOPSY
Anesthesia: General | Laterality: Right

## 2022-10-30 SURGERY — MRI WITH ANESTHESIA
Anesthesia: General

## 2022-10-30 MED ORDER — MANNITOL 25 % IV SOLN
INTRAVENOUS | Status: DC | PRN
  Administered 2022-10-30: 50 g via INTRAVENOUS

## 2022-10-30 MED ORDER — LIDOCAINE 2% (20 MG/ML) 5 ML SYRINGE
INTRAMUSCULAR | Status: AC
  Filled 2022-10-30: qty 5

## 2022-10-30 MED ORDER — LIDOCAINE-EPINEPHRINE 1 %-1:100000 IJ SOLN
INTRAMUSCULAR | Status: AC
  Filled 2022-10-30: qty 1

## 2022-10-30 MED ORDER — THROMBIN 5000 UNITS EX SOLR
OROMUCOSAL | Status: DC | PRN
  Administered 2022-10-30: 5 mL via TOPICAL

## 2022-10-30 MED ORDER — PHENYLEPHRINE HCL-NACL 20-0.9 MG/250ML-% IV SOLN
INTRAVENOUS | Status: DC | PRN
  Administered 2022-10-30: 75 ug/min via INTRAVENOUS

## 2022-10-30 MED ORDER — SODIUM CHLORIDE 0.9 % IV SOLN
0.2000 ug/kg/min | INTRAVENOUS | Status: DC
  Filled 2022-10-30: qty 2000

## 2022-10-30 MED ORDER — LIDOCAINE 2% (20 MG/ML) 5 ML SYRINGE
INTRAMUSCULAR | Status: DC | PRN
  Administered 2022-10-30: 80 mg via INTRAVENOUS

## 2022-10-30 MED ORDER — CHLORHEXIDINE GLUCONATE CLOTH 2 % EX PADS
6.0000 | MEDICATED_PAD | Freq: Once | CUTANEOUS | Status: AC
  Administered 2022-10-30: 6 via TOPICAL

## 2022-10-30 MED ORDER — HEMOSTATIC AGENTS (NO CHARGE) OPTIME
TOPICAL | Status: DC | PRN
  Administered 2022-10-30: 1 via TOPICAL

## 2022-10-30 MED ORDER — THROMBIN 20000 UNITS EX SOLR
CUTANEOUS | Status: DC | PRN
  Administered 2022-10-30: 20 mL via TOPICAL

## 2022-10-30 MED ORDER — CHLORHEXIDINE GLUCONATE CLOTH 2 % EX PADS: 6.0000 | MEDICATED_PAD | Freq: Once | CUTANEOUS | Status: DC

## 2022-10-30 MED ORDER — INSULIN ASPART 100 UNIT/ML IJ SOLN: 0.0000 [IU] | INTRAMUSCULAR | Status: DC | PRN

## 2022-10-30 MED ORDER — FLEET ENEMA 7-19 GM/118ML RE ENEM: 1.0000 | ENEMA | Freq: Once | RECTAL | Status: DC | PRN

## 2022-10-30 MED ORDER — PROPOFOL 10 MG/ML IV BOLUS
INTRAVENOUS | Status: AC
  Filled 2022-10-30: qty 20

## 2022-10-30 MED ORDER — BACITRACIN ZINC 500 UNIT/GM EX OINT
TOPICAL_OINTMENT | CUTANEOUS | Status: AC
  Filled 2022-10-30: qty 28.35

## 2022-10-30 MED ORDER — VASOPRESSIN 20 UNIT/ML IV SOLN
INTRAVENOUS | Status: AC
  Filled 2022-10-30: qty 1

## 2022-10-30 MED ORDER — CEFAZOLIN SODIUM-DEXTROSE 2-4 GM/100ML-% IV SOLN
2.0000 g | Freq: Three times a day (TID) | INTRAVENOUS | Status: AC
  Administered 2022-10-30 – 2022-10-31 (×2): 2 g via INTRAVENOUS
  Filled 2022-10-30 (×2): qty 100

## 2022-10-30 MED ORDER — 0.9 % SODIUM CHLORIDE (POUR BTL) OPTIME
TOPICAL | Status: DC | PRN
  Administered 2022-10-30: 3000 mL

## 2022-10-30 MED ORDER — FENTANYL CITRATE (PF) 250 MCG/5ML IJ SOLN
INTRAMUSCULAR | Status: AC
  Filled 2022-10-30: qty 5

## 2022-10-30 MED ORDER — POTASSIUM CHLORIDE 10 MEQ/100ML IV SOLN: 10.0000 meq | INTRAVENOUS | Status: AC

## 2022-10-30 MED ORDER — SODIUM CHLORIDE 0.9 % IV SOLN: INTRAVENOUS | Status: DC

## 2022-10-30 MED ORDER — OXYCODONE-ACETAMINOPHEN 5-325 MG PO TABS
1.0000 | ORAL_TABLET | ORAL | Status: DC | PRN
  Administered 2022-10-30: 1 via ORAL
  Filled 2022-10-30: qty 1

## 2022-10-30 MED ORDER — LIDOCAINE-EPINEPHRINE 1 %-1:100000 IJ SOLN
INTRAMUSCULAR | Status: DC | PRN
  Administered 2022-10-30: 7 mL

## 2022-10-30 MED ORDER — ONDANSETRON HCL 4 MG/2ML IJ SOLN
INTRAMUSCULAR | Status: DC | PRN
  Administered 2022-10-30: 4 mg via INTRAVENOUS

## 2022-10-30 MED ORDER — HYDROCODONE-ACETAMINOPHEN 5-325 MG PO TABS
1.0000 | ORAL_TABLET | ORAL | Status: DC | PRN
  Administered 2022-10-31: 1 via ORAL
  Filled 2022-10-30: qty 1

## 2022-10-30 MED ORDER — CHLORHEXIDINE GLUCONATE CLOTH 2 % EX PADS
6.0000 | MEDICATED_PAD | Freq: Every day | CUTANEOUS | Status: DC
  Administered 2022-10-30 – 2022-11-11 (×13): 6 via TOPICAL

## 2022-10-30 MED ORDER — DEXAMETHASONE SODIUM PHOSPHATE 10 MG/ML IJ SOLN
INTRAMUSCULAR | Status: AC
  Filled 2022-10-30: qty 1

## 2022-10-30 MED ORDER — ROCURONIUM BROMIDE 10 MG/ML (PF) SYRINGE
PREFILLED_SYRINGE | INTRAVENOUS | Status: DC | PRN
  Administered 2022-10-30: 20 mg via INTRAVENOUS
  Administered 2022-10-30: 40 mg via INTRAVENOUS
  Administered 2022-10-30: 60 mg via INTRAVENOUS

## 2022-10-30 MED ORDER — DOCUSATE SODIUM 100 MG PO CAPS
100.0000 mg | ORAL_CAPSULE | Freq: Two times a day (BID) | ORAL | Status: DC
  Administered 2022-10-30 – 2022-11-01 (×3): 100 mg via ORAL
  Filled 2022-10-30 (×3): qty 1

## 2022-10-30 MED ORDER — PHENYLEPHRINE 80 MCG/ML (10ML) SYRINGE FOR IV PUSH (FOR BLOOD PRESSURE SUPPORT)
PREFILLED_SYRINGE | INTRAVENOUS | Status: DC | PRN
  Administered 2022-10-30 (×3): 160 ug via INTRAVENOUS

## 2022-10-30 MED ORDER — ONDANSETRON HCL 4 MG/2ML IJ SOLN
INTRAMUSCULAR | Status: AC
  Filled 2022-10-30: qty 2

## 2022-10-30 MED ORDER — CEFAZOLIN SODIUM-DEXTROSE 2-4 GM/100ML-% IV SOLN
2.0000 g | INTRAVENOUS | Status: AC
  Administered 2022-10-30: 2 g via INTRAVENOUS
  Filled 2022-10-30: qty 100

## 2022-10-30 MED ORDER — SODIUM CHLORIDE 0.9 % IV SOLN
INTRAVENOUS | Status: DC | PRN
  Administered 2022-10-30: 500 mg via INTRAVENOUS

## 2022-10-30 MED ORDER — SODIUM CHLORIDE 0.9 % IV SOLN: INTRAVENOUS | Status: DC | PRN

## 2022-10-30 MED ORDER — PROMETHAZINE HCL 25 MG PO TABS: 12.5000 mg | ORAL_TABLET | ORAL | Status: DC | PRN

## 2022-10-30 MED ORDER — FENTANYL CITRATE (PF) 250 MCG/5ML IJ SOLN
INTRAMUSCULAR | Status: DC | PRN
  Administered 2022-10-30: 100 ug via INTRAVENOUS

## 2022-10-30 MED ORDER — DEXAMETHASONE SODIUM PHOSPHATE 10 MG/ML IJ SOLN
INTRAMUSCULAR | Status: DC | PRN
  Administered 2022-10-30: 10 mg via INTRAVENOUS

## 2022-10-30 MED ORDER — ROCURONIUM BROMIDE 10 MG/ML (PF) SYRINGE
PREFILLED_SYRINGE | INTRAVENOUS | Status: AC
  Filled 2022-10-30: qty 10

## 2022-10-30 MED ORDER — BACITRACIN ZINC 500 UNIT/GM EX OINT
TOPICAL_OINTMENT | CUTANEOUS | Status: DC | PRN
  Administered 2022-10-30: 1 via TOPICAL

## 2022-10-30 MED ORDER — LEVETIRACETAM IN NACL 500 MG/100ML IV SOLN
500.0000 mg | Freq: Two times a day (BID) | INTRAVENOUS | Status: DC
  Administered 2022-10-30 – 2022-11-01 (×4): 500 mg via INTRAVENOUS
  Filled 2022-10-30 (×4): qty 100

## 2022-10-30 MED ORDER — INSULIN ASPART 100 UNIT/ML IJ SOLN
INTRAMUSCULAR | Status: DC | PRN
  Administered 2022-10-30: 4 [IU] via SUBCUTANEOUS

## 2022-10-30 MED ORDER — THROMBIN 5000 UNITS EX SOLR
CUTANEOUS | Status: AC
  Filled 2022-10-30: qty 5000

## 2022-10-30 MED ORDER — THROMBIN 20000 UNITS EX SOLR
CUTANEOUS | Status: AC
  Filled 2022-10-30: qty 20000

## 2022-10-30 MED ORDER — PROPOFOL 10 MG/ML IV BOLUS
INTRAVENOUS | Status: DC | PRN
  Administered 2022-10-30: 100 mg via INTRAVENOUS

## 2022-10-30 MED ORDER — LABETALOL HCL 5 MG/ML IV SOLN: 10.0000 mg | INTRAVENOUS | Status: DC | PRN

## 2022-10-30 MED ORDER — SUGAMMADEX SODIUM 200 MG/2ML IV SOLN
INTRAVENOUS | Status: DC | PRN
  Administered 2022-10-30: 200 mg via INTRAVENOUS

## 2022-10-30 MED ORDER — CEFAZOLIN SODIUM-DEXTROSE 2-4 GM/100ML-% IV SOLN
2.0000 g | INTRAVENOUS | Status: DC
Start: 2022-10-30 — End: 2022-10-30

## 2022-10-30 SURGICAL SUPPLY — 108 items
APL SKNCLS STERI-STRIP NONHPOA (GAUZE/BANDAGES/DRESSINGS)
BAG COUNTER SPONGE SURGICOUNT (BAG) ×1 IMPLANT
BAG SPNG CNTER NS LX DISP (BAG) ×1
BENZOIN TINCTURE PRP APPL 2/3 (GAUZE/BANDAGES/DRESSINGS) IMPLANT
BLADE CLIPPER SURG (BLADE) ×1 IMPLANT
BLADE SAW GIGLI 16 STRL (MISCELLANEOUS) IMPLANT
BLADE SURG 11 STRL SS (BLADE) ×1 IMPLANT
BLADE SURG 15 STRL LF DISP TIS (BLADE) IMPLANT
BLADE SURG 15 STRL SS (BLADE)
BNDG CMPR 75X41 PLY HI ABS (GAUZE/BANDAGES/DRESSINGS)
BNDG GAUZE DERMACEA FLUFF 4 (GAUZE/BANDAGES/DRESSINGS) IMPLANT
BNDG GZE DERMACEA 4 6PLY (GAUZE/BANDAGES/DRESSINGS)
BNDG STRETCH 4X75 STRL LF (GAUZE/BANDAGES/DRESSINGS) IMPLANT
BUR ACORN 9.0 PRECISION (BURR) ×1 IMPLANT
BUR ROUND FLUTED 4 SOFT TCH (BURR) IMPLANT
BUR SPIRAL ROUTER 2.3 (BUR) ×1 IMPLANT
CANISTER SUCT 3000ML PPV (MISCELLANEOUS) ×2 IMPLANT
CATH VENTRIC 35X38 W/TROCAR LG (CATHETERS) IMPLANT
CLIP TI MEDIUM 6 (CLIP) IMPLANT
CNTNR URN SCR LID CUP LEK RST (MISCELLANEOUS) ×1 IMPLANT
CONT SPEC 4OZ STRL OR WHT (MISCELLANEOUS) ×1
COVER BURR HOLE 7 (Orthopedic Implant) IMPLANT
COVER MAYO STAND STRL (DRAPES) IMPLANT
COVERAGE SUPPORT O-ARM STEALTH (MISCELLANEOUS) ×1 IMPLANT
DRAIN SUBARACHNOID (WOUND CARE) IMPLANT
DRAPE 3/4 80X56 (DRAPES) ×1 IMPLANT
DRAPE HALF SHEET 40X57 (DRAPES) ×1 IMPLANT
DRAPE MICROSCOPE SLANT 54X150 (MISCELLANEOUS) IMPLANT
DRAPE NEUROLOGICAL W/INCISE (DRAPES) ×1 IMPLANT
DRAPE STERI IOBAN 125X83 (DRAPES) IMPLANT
DRAPE SURG 17X23 STRL (DRAPES) IMPLANT
DRAPE WARM FLUID 44X44 (DRAPES) ×1 IMPLANT
DRSG ADAPTIC 3X8 NADH LF (GAUZE/BANDAGES/DRESSINGS) IMPLANT
DRSG AQUACEL AG ADV 3.5X 6 (GAUZE/BANDAGES/DRESSINGS) IMPLANT
DRSG AQUACEL AG ADV 3.5X10 (GAUZE/BANDAGES/DRESSINGS) IMPLANT
DRSG TELFA 3X8 NADH STRL (GAUZE/BANDAGES/DRESSINGS) IMPLANT
DURAPREP 6ML APPLICATOR 50/CS (WOUND CARE) ×1 IMPLANT
ELECT COATED BLADE 2.86 ST (ELECTRODE) ×1 IMPLANT
ELECT REM PT RETURN 9FT ADLT (ELECTROSURGICAL) ×1
ELECTRODE REM PT RTRN 9FT ADLT (ELECTROSURGICAL) ×1 IMPLANT
EVACUATOR 1/8 PVC DRAIN (DRAIN) IMPLANT
EVACUATOR SILICONE 100CC (DRAIN) IMPLANT
FEE COVERAGE SUPPORT O-ARM (MISCELLANEOUS) ×1 IMPLANT
FORCEPS BIPO MALIS IRRIG 9X1.5 (NEUROSURGERY SUPPLIES) ×1 IMPLANT
FORCEPS BIPOLAR SPETZLER 8 1.0 (NEUROSURGERY SUPPLIES) IMPLANT
GAUZE 4X4 16PLY ~~LOC~~+RFID DBL (SPONGE) IMPLANT
GAUZE SPONGE 4X4 12PLY STRL (GAUZE/BANDAGES/DRESSINGS) IMPLANT
GAUZE XEROFORM 1X8 LF (GAUZE/BANDAGES/DRESSINGS) IMPLANT
GLOVE BIO SURGEON STRL SZ7 (GLOVE) ×4 IMPLANT
GLOVE BIO SURGEON STRL SZ7.5 (GLOVE) ×1 IMPLANT
GLOVE BIOGEL PI IND STRL 7.0 (GLOVE) ×2 IMPLANT
GLOVE BIOGEL PI IND STRL 7.5 (GLOVE) ×3 IMPLANT
GLOVE ECLIPSE 7.5 STRL STRAW (GLOVE) IMPLANT
GLOVE EXAM NITRILE LRG STRL (GLOVE) IMPLANT
GLOVE EXAM NITRILE XL STR (GLOVE) IMPLANT
GOWN STRL REUS W/ TWL LRG LVL3 (GOWN DISPOSABLE) ×2 IMPLANT
GOWN STRL REUS W/ TWL XL LVL3 (GOWN DISPOSABLE) ×2 IMPLANT
GOWN STRL REUS W/TWL 2XL LVL3 (GOWN DISPOSABLE) IMPLANT
GOWN STRL REUS W/TWL LRG LVL3 (GOWN DISPOSABLE) ×2
GOWN STRL REUS W/TWL XL LVL3 (GOWN DISPOSABLE) ×2
GRAFT DURAGEN MATRIX 2WX2L IMPLANT
HEMOSTAT POWDER KIT SURGIFOAM (HEMOSTASIS) ×1 IMPLANT
HEMOSTAT SURGICEL 2X14 (HEMOSTASIS) ×1 IMPLANT
HEMOSTAT SURGICEL 2X4 FIBR (HEMOSTASIS) IMPLANT
HOOK DURA 1/2IN (MISCELLANEOUS) ×1 IMPLANT
HOOK RETRACTION 12 ELAST STAY (MISCELLANEOUS) IMPLANT
IV NS 1000ML (IV SOLUTION) ×1
IV NS 1000ML BAXH (IV SOLUTION) ×1 IMPLANT
KIT BASIN OR (CUSTOM PROCEDURE TRAY) ×1 IMPLANT
KIT DRAIN CSF ACCUDRAIN (MISCELLANEOUS) IMPLANT
KIT TURNOVER KIT B (KITS) ×1 IMPLANT
MARKER SPHERE PSV REFLC NDI (MISCELLANEOUS) ×3 IMPLANT
NDL SPNL 18GX3.5 QUINCKE PK (NEEDLE) IMPLANT
NEEDLE HYPO 22GX1.5 SAFETY (NEEDLE) ×1 IMPLANT
NEEDLE SPNL 18GX3.5 QUINCKE PK (NEEDLE) IMPLANT
NS IRRIG 1000ML POUR BTL (IV SOLUTION) ×3 IMPLANT
PACK CRANIOTOMY CUSTOM (CUSTOM PROCEDURE TRAY) ×1 IMPLANT
PATTIES SURGICAL .25X.25 (GAUZE/BANDAGES/DRESSINGS) IMPLANT
PATTIES SURGICAL .5 X.5 (GAUZE/BANDAGES/DRESSINGS) IMPLANT
PATTIES SURGICAL .5 X3 (DISPOSABLE) IMPLANT
PATTIES SURGICAL 1/4 X 3 (GAUZE/BANDAGES/DRESSINGS) IMPLANT
PATTIES SURGICAL 1X1 (DISPOSABLE) IMPLANT
PIN MAYFIELD SKULL DISP (PIN) ×1 IMPLANT
PLATE CRANIAL 12 2H RIGID UNI (Plate) IMPLANT
SCREW UNIII AXS SD 1.5X4 (Screw) IMPLANT
SEALANT ADHERUS EXTEND TIP (MISCELLANEOUS) IMPLANT
SET TUBING IRRIGATION DISP (TUBING) ×1 IMPLANT
SOL ELECTROSURG ANTI STICK (MISCELLANEOUS) ×2
SOLUTION ELECTROSURG ANTI STCK (MISCELLANEOUS) ×2 IMPLANT
SPIKE FLUID TRANSFER (MISCELLANEOUS) ×1 IMPLANT
SPONGE NEURO XRAY DETECT 1X3 (DISPOSABLE) IMPLANT
SPONGE SURGIFOAM ABS GEL 100 (HEMOSTASIS) ×1 IMPLANT
STAPLER VISISTAT 35W (STAPLE) ×1 IMPLANT
STOCKINETTE STERILE 6X72 (MISCELLANEOUS) IMPLANT
SUT ETHILON 3 0 FSL (SUTURE) IMPLANT
SUT ETHILON 3 0 PS 1 (SUTURE) IMPLANT
SUT MNCRL AB 3-0 PS2 18 (SUTURE) IMPLANT
SUT NURALON 4 0 TR CR/8 (SUTURE) ×3 IMPLANT
SUT SILK 0 TIES 10X30 (SUTURE) IMPLANT
SUT VIC AB 2-0 CP2 18 (SUTURE) ×1 IMPLANT
SUT VICRYL RAPIDE 3 0 (SUTURE) IMPLANT
TOWEL GREEN STERILE (TOWEL DISPOSABLE) ×1 IMPLANT
TOWEL GREEN STERILE FF (TOWEL DISPOSABLE) ×1 IMPLANT
TRAY FOLEY MTR SLVR 16FR STAT (SET/KITS/TRAYS/PACK) ×1 IMPLANT
TUBE CONNECTING 12X1/4 (SUCTIONS) ×1 IMPLANT
TUBING FEATHERFLOW (TUBING) IMPLANT
UNDERPAD 30X36 HEAVY ABSORB (UNDERPADS AND DIAPERS) ×1 IMPLANT
WATER STERILE IRR 1000ML POUR (IV SOLUTION) ×1 IMPLANT

## 2022-10-30 NOTE — Anesthesia Procedure Notes (Addendum)
Procedure Name: Intubation Date/Time: 10/30/2022 1:50 PM  Performed by: Alwyn Ren, CRNAPre-anesthesia Checklist: Patient identified, Emergency Drugs available, Suction available and Patient being monitored Patient Re-evaluated:Patient Re-evaluated prior to induction Oxygen Delivery Method: Circle system utilized Preoxygenation: Pre-oxygenation with 100% oxygen Induction Type: IV induction Ventilation: Mask ventilation without difficulty Laryngoscope Size: Glidescope and 3 Grade View: Grade I Tube type: Oral Tube size: 7.5 mm Number of attempts: 1 Airway Equipment and Method: Oral airway and Rigid stylet Placement Confirmation: ETT inserted through vocal cords under direct vision, positive ETCO2 and breath sounds checked- equal and bilateral Secured at: 22 cm Tube secured with: Tape Dental Injury: Teeth and Oropharynx as per pre-operative assessment  Comments: Unable to assess airway pre op- appears t have short TMD

## 2022-10-30 NOTE — Progress Notes (Signed)
PROGRESS NOTE  Jeremy Medina. ZOX:[id number] DOB: 1951-03-29   PCP: [facility]  Patient is from: Home  DOA: 09/28/2022 LOS: 28  Chief complaints Chief Complaint  Patient presents with   Weakness    Falls     Brief Narrative / Interim history: 72 year old M with PMH of DM-2, HTN, pancreatic insufficiency, GERD, chronic pain on methadone, PTSD and anxiety sent to ED from neurosurgery clinic for progressive subacute encephalopathy on 4/12.  Patient was recently hospitalized at the [facility] for subacute encephalopathy 3 months back and was thought to have NPH.  He was discharged home with neurology and neurosurgery follow-up.  CT head, MRI brain without contrast and MRV without acute finding.  Neurology consulted.  No improvement with IV thiamine.  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   LP on 4/17 and 4/25 didn't pinpoint etiology.  CSF studies including CJD unrevealing.  He also had CT chest, abdomen and pelvis that was negative for malignancy.  Neurosurgery indicated that patient is poor candidate for shunt placement on 4/30.  Neurology recommended tissue biopsy that seems to be planned for 5/14.     Subjective: Seen and examined earlier this morning.  No major events overnight of this morning.  Awake and alert but minimally verbal.  Follows commands.  Does not appear to be in distress.  Objective: Vitals:   10/30/22 0519 10/30/22 0548 10/30/22 0730 10/30/22 1238  BP:  136/78 (!) 142/98 (!) 147/81  Pulse: 84 85 83 79  Resp:  16  18  Temp:  98.2 F (36.8 C)  98.1 F (36.7 C)  TempSrc:    Axillary  SpO2: 100% 99% 100% 94%  Weight:    102 kg  Height:    5\' 10"  (1.778 m)    Examination:  GENERAL: No apparent distress.  Nontoxic. HEENT: MMM.  Vision and hearing grossly intact.  NECK: Supple.  No apparent JVD.  RESP:   No IWOB.  Fair aeration bilaterally. CVS:  RRR. Heart sounds normal.  ABD/GI/GU: BS+. Abd soft.  Some TTP MSK/EXT:   No apparent deformity. Moves extremities. No edema.  SKIN: no apparent skin lesion or wound NEURO: Awake and alert.  Minimally verbal.  Follows some commands but not consistent.  PSYCH: Calm. Normal affect.   Procedures:  4/17 LP  4/25 LP   Assessment and plan: Principal Problem:   Suspected fungal meningitis Active Problems:   Progressive subacute encephalopathy   Chronic pain syndrome   Depression   Anxiety   Benign essential hypertension   Diabetes mellitus type 2, controlled, without complications (HCC)   Hyperlipidemia   Acute kidney injury (HCC)   Folate deficiency   Transaminitis   Obesity (BMI 30-39.9)   Hypokalemia   Dysphagia   Vomiting   Altered mental status   Fecal impaction (HCC)   Abnormal CT scan, colon  Progressive subacute encephalopathy: Unclear etiology despite extensive workup.  The probably did not improve with high-dose thiamine.  Some concern about fungal meningitis at some point but felt to be less likely.  Currently awake and alert.  Minimally verbal.  Follows some commands but not consistently. 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  H&H relatively stable Recent Labs    10/21/22 0230 10/21/22 0712 10/21/22 1212 10/21/22 1749 10/22/22 0047 10/23/22 0846 10/24/22 0326 10/25/22 0800  10/26/22 0650 10/30/22 0539  HGB 11.4* 11.3* 11.3* 11.7* 9.9* 10.9* 9.8* 11.5* 11.3* 11.6*  -Continue monitoring -Continue folic acid supplementation -No indication or plan for colonoscopy per GI  Uncontrolled IDDM-2 with hyperglycemia: A1c 9.7% on 3/28.  On 70/30 insulin at home. Recent Labs  Lab 10/29/22 2009 10/29/22 2319 10/30/22 0522 10/30/22 0808 10/30/22 1205  GLUCAP 83 92 129* 130* 171*  -Continue Semglee 35  units daily -Continue NovoLog 6 units 3 times daily with meals. -Continue SSI resistant scale. -Further adjustment as appropriate  Hypokalemia/hypomagnesemia -Monitor replenish as appropriate   Chronic pain syndrome -Continue methadone 10 mg 3 times daily   Transaminitis: Likely NASH given normal [place].     Fecal impaction/constipation: Fecal impaction solved. -Continue bowel regimen.   Benign essential hypertension -Continue amlodipine -Hydralazine as needed   Folate deficiency  -Continue folate supplement.   Anxiety: Stable.  Hypokalemia: K3.3 -IV KCl 10 x 4 while NPO   Goals of care discussion.  Palliative care on board.   Pancreatic insufficiency -Continue on Creon  Obesity Body mass index is 32.27 kg/m.           DVT prophylaxis:  SCD's Start: 10/30/22 0043 SCDs Start: 10/21/22 0604 Place and maintain sequential compression device Start: 10/02/22 0808  Code Status: Full code Family Communication: None at bedside Level of care: Med-Surg Status is: Inpatient Remains inpatient appropriate because: Due to encephalopathy   Final disposition: TBD Consultants:  Neurology Neurosurgery Infectious disease  35 minutes with more than 50% spent in reviewing records, counseling patient/family and coordinating care.   Sch Meds:  Scheduled Meds:  acetaminophen  1,000 mg Oral Once   [MAR Hold] amLODipine  10 mg Oral Daily   Chlorhexidine Gluconate Cloth  6 each Topical Once   [MAR Hold] feeding supplement (GLUCERNA SHAKE)  237 mL Oral TID BM   [MAR Hold] folic acid  1 mg Oral Daily   [MAR Hold] insulin aspart  0-20 Units Subcutaneous TID WC   [MAR Hold] insulin aspart  0-5 Units Subcutaneous QHS   [MAR Hold] insulin aspart  6 Units Subcutaneous TID WC   [MAR Hold] insulin glargine-yfgn  35 Units Subcutaneous Daily   [MAR Hold] lidocaine (PF)  5 mL Other Once   [MAR Hold] lipase/protease/amylase  36,000 Units Oral TID AC   [MAR Hold] methadone  10 mg Oral  TID   [MAR Hold] pantoprazole  40 mg Oral Daily   [MAR Hold] sodium chloride flush  3 mL Intravenous Q12H   Continuous Infusions:   ceFAZolin (ANCEF) IV     dextrose     PRN Meds:.[MAR Hold] acetaminophen, [MAR Hold] acetaminophen, insulin aspart, [MAR Hold] ondansetron **OR** [MAR Hold] ondansetron (ZOFRAN) IV, [MAR Hold] polyethylene glycol, [COMPLETED] senna-docusate **FOLLOWED BY** [MAR Hold] senna-docusate  Antimicrobials: Anti-infectives (From admission, onward)    Start     Dose/Rate Route Frequency Ordered Stop   10/30/22 1200  ceFAZolin (ANCEF) IVPB 2g/100 mL premix        2 g 200 mL/hr over 30 Minutes Intravenous On call to O.R. 10/30/22 0042 10/31/22 1159   10/12/22 0700  vancomycin (VANCOREADY) IVPB 750 mg/150 mL  Status:  Discontinued        750 mg 150 mL/hr over 60 Minutes Intravenous Every 12 hours 10/11/22 1811 10/12/22 1011   10/11/22 2200  cefTRIAXone (ROCEPHIN) 2 g in sodium chloride 0.9 % 100 mL IVPB  Status:  Discontinued        2 g 200 mL/hr over 30 Minutes  Intravenous Every 12 hours 10/11/22 1749 10/13/22 2008   10/11/22 2000  ampicillin (OMNIPEN) 2 g in sodium chloride 0.9 % 100 mL IVPB  Status:  Discontinued        2 g 300 mL/hr over 20 Minutes Intravenous Every 4 hours 10/11/22 1752 10/12/22 1011   10/11/22 1845  cefTRIAXone (ROCEPHIN) 2 g in sodium chloride 0.9 % 100 mL IVPB  Status:  Discontinued        2 g 200 mL/hr over 30 Minutes Intravenous Every 24 hours 10/11/22 1746 10/11/22 1749   10/11/22 1845  vancomycin (VANCOREADY) IVPB 1750 mg/350 mL        1,750 mg 175 mL/hr over 120 Minutes Intravenous  Once 10/11/22 1754 10/11/22 2333   10/04/22 1500  amphotericin B liposome (AMBISOME) 500 mg in dextrose 5 % 500 mL IVPB  Status:  Discontinued        500 mg 312.5 mL/hr over 120 Minutes Intravenous Every 24 hours 10/04/22 1159 10/13/22 0904        I have personally reviewed the following labs and images: CBC: Recent Labs  Lab 10/24/22 0326  10/25/22 0800 10/26/22 0650 10/30/22 0539  WBC 13.2* 13.4* 13.9* 14.2*  HGB 9.8* 11.5* 11.3* 11.6*  HCT 29.1* 34.2* 34.1* 35.7*  MCV 85.6 84.2 86.1 88.6  PLT 156 PLATELET CLUMPS NOTED ON SMEAR, UNABLE TO ESTIMATE 194 176   BMP &GFR Recent Labs  Lab 10/24/22 0326 10/25/22 0800 10/26/22 0650 10/30/22 0539  NA 138 140 139 138  K 3.7 3.2* 3.6 3.3*  CL 106 104 104 103  CO2 25 26 26 26   GLUCOSE 322* 238* 149* 119*  BUN 15 12 12 21   CREATININE 1.00 0.88 1.05 0.94  CALCIUM 8.0* 8.4* 8.8* 8.9  MG 1.6* 1.7 1.7 1.9  PHOS  --   --  4.0 3.9   Estimated Creatinine Clearance: 87.5 mL/min (by C-G formula based on SCr of 0.94 mg/dL). Liver & Pancreas: Recent Labs  Lab 10/25/22 0800 10/26/22 0650 10/30/22 0539  AST 35  --   --   ALT 177*  --   --   ALKPHOS 96  --   --   BILITOT 0.7  --   --   PROT 5.4*  --   --   ALBUMIN 2.7* 2.7* 3.0*   No results for input(s): "LIPASE", "AMYLASE" in the last 168 hours. No results for input(s): "AMMONIA" in the last 168 hours. Diabetic: No results for input(s): "HGBA1C" in the last 72 hours. Recent Labs  Lab 10/29/22 2009 10/29/22 2319 10/30/22 0522 10/30/22 0808 10/30/22 1205  GLUCAP 83 92 129* 130* 171*   Cardiac Enzymes: No results for input(s): "CKTOTAL", "CKMB", "CKMBINDEX", "TROPONINI" in the last 168 hours. No results for input(s): "PROBNP" in the last 8760 hours. Coagulation Profile: No results for input(s): "INR", "PROTIME" in the last 168 hours. Thyroid Function Tests: No results for input(s): "TSH", "T4TOTAL", "FREET4", "T3FREE", "THYROIDAB" in the last 72 hours. Lipid Profile: No results for input(s): "CHOL", "HDL", "LDLCALC", "TRIG", "CHOLHDL", "LDLDIRECT" in the last 72 hours. Anemia Panel: No results for input(s): "VITAMINB12", "FOLATE", "FERRITIN", "TIBC", "IRON", "RETICCTPCT" in the last 72 hours.  Urine analysis:    Component Value Date/Time   COLORURINE AMBER (A) 09/28/2022 1931   APPEARANCEUR HAZY (A)  09/28/2022 1931   LABSPEC 1.020 09/28/2022 1931   PHURINE 5.0 09/28/2022 1931   GLUCOSEU >=500 (A) 09/28/2022 1931   HGBUR LARGE (A) 09/28/2022 1931   BILIRUBINUR NEGATIVE 09/28/2022 1931  BILIRUBINUR negative 04/27/2019 1559   KETONESUR 5 (A) 09/28/2022 1931   PROTEINUR 30 (A) 09/28/2022 1931   UROBILINOGEN 0.2 04/27/2019 1559   UROBILINOGEN 0.2 03/16/2015 1501   NITRITE NEGATIVE 09/28/2022 1931   LEUKOCYTESUR NEGATIVE 09/28/2022 1931   Sepsis Labs: Invalid input(s): "PROCALCITONIN", "LACTICIDVEN"  Microbiology: No results found for this or any previous visit (from the past 240 hour(s)).  Radiology Studies: CT DBS HEAD W/O CONTRAST ( )  Result Date: 10/29/2022 CLINICAL DATA:  Brain lesion. EXAM: CT HEAD WITHOUT CONTRAST TECHNIQUE: Contiguous axial images were obtained from the base of the skull through the vertex without intravenous contrast. RADIATION DOSE REDUCTION: This exam was performed according to the departmental dose-optimization program which includes automated exposure control, adjustment of the mA and/or kV according to patient size and/or use of iterative reconstruction technique. COMPARISON:  MRI brain 10/26/2022.  Head CT 09/28/2022. FINDINGS: Brain: No acute intracranial hemorrhage. Unchanged severe chronic small-vessel disease. Unchanged disproportionate prominence of the ventricles relative to the sulci with acute callosal angle, suggestive of idiopathic normal pressure hydrocephalus. No extra-axial collection. No mass effect or midline shift. Vascular: No hyperdense vessel or unexpected calcification. Skull: No calvarial fracture or suspicious bone lesion. Skull base is unremarkable. Sinuses/Orbits: Unremarkable. Other: None. IMPRESSION: 1. No acute intracranial abnormality. 2. Unchanged severe chronic small-vessel disease. 3. Unchanged disproportionate prominence of the ventricles relative to the sulci with acute callosal angle, suggestive of idiopathic normal pressure  hydrocephalus. Electronically Signed   By: Orvan Falconer M.D.   On: 10/29/2022 21:37      Shakemia Madera T. Steffany Schoenfelder [facility]  If 7PM-7AM, please contact night-coverage [email or web address] 10/30/2022, 1:19 PM

## 2022-10-30 NOTE — Anesthesia Preprocedure Evaluation (Signed)
Anesthesia Evaluation  Patient identified by MRN, date of birth, ID band Patient unresponsive    Reviewed: Allergy & Precautions, NPO status , Patient's Chart, lab work & pertinent test results  Airway Mallampati: II  TM Distance: >3 FB Neck ROM: Full    Dental  (+) Dental Advisory Given, Poor Dentition   Pulmonary Current Smoker   Pulmonary exam normal breath sounds clear to auscultation       Cardiovascular hypertension, Pt. on medications Normal cardiovascular exam Rhythm:Regular Rate:Normal     Neuro/Psych  Headaches PSYCHIATRIC DISORDERS Anxiety Depression    brain lesion    GI/Hepatic Neg liver ROS,GERD  Medicated,,  Endo/Other  diabetes, Type 2, Insulin Dependent  Obesity   Renal/GU negative Renal ROS     Musculoskeletal  (+) Arthritis ,    Abdominal   Peds  Hematology  (+) Blood dyscrasia, anemia   Anesthesia Other Findings Day of surgery medications reviewed with the patient.  Reproductive/Obstetrics                              Anesthesia Physical Anesthesia Plan  ASA: 4  Anesthesia Plan: General   Post-op Pain Management: Ofirmev IV (intra-op)*   Induction: Intravenous  PONV Risk Score and Plan: 1 and Dexamethasone and Ondansetron  Airway Management Planned: Oral ETT  Additional Equipment:   Intra-op Plan:   Post-operative Plan: Possible Post-op intubation/ventilation  Informed Consent: I have reviewed the patients History and Physical, chart, labs and discussed the procedure including the risks, benefits and alternatives for the proposed anesthesia with the patient or authorized representative who has indicated his/her understanding and acceptance.     Dental advisory given, Consent reviewed with POA and History available from chart only  Plan Discussed with: CRNA  Anesthesia Plan Comments: (Phone consent with patient's son. 2 nurse phone consent. All  questions answered.)         Anesthesia Quick Evaluation

## 2022-10-30 NOTE — Progress Notes (Signed)
I discussed the planned procedure of right craniotomy with excisional biopsy, possible ventriculocisternostomy with the patient's son via phone [phone].  Risks, benefits, alternatives, and expected convalescence were discussed with him and his family.  Risks discussed included, but were not limited to, bleeding, pain, infection, seizure, scar, stroke, neurologic deficit, nondiagnostic tissue, and death.  Informed consent was obtained.

## 2022-10-30 NOTE — Progress Notes (Signed)
OT Cancellation Note  Patient Details Name: Jeremy Medina. MRN: [id number] DOB: [birth year]   Cancelled Treatment:    Reason Eval/Treat Not Completed: Patient at procedure or test/ unavailable (right craniotomy with excisional biopsy, possible ventriculocisternostomy - OT to f/u post-op.)  Donia Pounds 10/30/2022, 10:35 AM

## 2022-10-30 NOTE — Transfer of Care (Signed)
Immediate Anesthesia Transfer of Care Note  Patient: Jeremy Medina.  Procedure(s) Performed: RIGHT CRANIOTOMY FOR TEMPORAL LOBE BIOPSY (Right) APPLICATION OF CRANIAL NAVIGATION (Right)  Patient Location: PACU  Anesthesia Type:General  Level of Consciousness: drowsy  Airway & Oxygen Therapy: Patient Spontanous Breathing and Patient connected to face mask oxygen  Post-op Assessment: Report given to RN and Post -op Vital signs reviewed and stable  Post vital signs: Reviewed and stable  Last Vitals: LOC returned to baseline Vitals Value Taken Time  BP 145/84 10/30/22 1727  Temp    Pulse 79 10/30/22 1730  Resp 19 10/30/22 1730  SpO2 100 % 10/30/22 1730  Vitals shown include unvalidated device data.  Last Pain:  Vitals:   10/30/22 1301  TempSrc:   PainSc: 0-No pain         Complications: No notable events documented.

## 2022-10-30 NOTE — Op Note (Addendum)
Op Note  Jeremy Medina. male 72 y.o. 10/30/2022  Procedure(s) and Anesthesia Type: Right craniotomy for excision of temporal lobe mass/lesion 2.  Temporal lobe ventriculocisternostomy using microsurgical technique 3.  Use of intraoperative neuronavigation 4.  Use of microsurgical technique  Surgeon(s) and Role:    Bedelia Person, MD - Primary    Patrici Ranks - Assisting  Indications: This is a 72 yo M who developed rapidly progressive cognitive declime.  He was found to have ventriculomegaly as was as diffuse leptomeningeal disease. CSF showed pleocytosis, but cultures were negative.  As neurologic workup had been negative, excisional biopsy of a right temporal lobe lesion was recommended.  Patient also had Tri-ventriculomegaly, and though suspicion was higher for communicating hydrocephalus than obstructive hydrocephalus, it was reasonable to consider a ventriculocisternostomy risks, benefits, alternatives, and expected convalescence were discussed with her son.  Risks discussed included but were not limited to bleeding, pain, infection, seizure, stroke, scar, nondiagnostic tissue, neurologic deficit, coma, and death.  Informed consent was obtained.  Surgeon: Bedelia Person   Assistants: Barnett Abu, MD, and Patrici Ranks, PA who provided critical assistance throughout the case.  No qualified trainees were available to assist with the procedure.  Anesthesia: General endotracheal anesthesia   Procedure in detail:  The patient was brought to the operating room.  A timeout was performed.  General anesthesia was induced and patient was intubated by the anesthesia service.  After appropriate lines and monitors were placed, patient's head was turned to the right and scalp was shaved, preprepped with alcohol and cleansing solution. [place] head holder placed and head was affixed to the bed.  A thin cut preoperative CT and MRI were reconstructed into 3D image to allow for  surface match registration with the Medtronic Stealth system.  Curvilinear incision was planned from in front of the tragus to the hairline.  Prepped and draped in sterile fashion.  1% lidocaine with epinephrine injected into the planned incision.  incision was made with a 10 blade and monopolar electrocautery was used to open the galea and incised the temporalis fascia.  The periosteum and temporalis muscle was dissected off the skull and the myocutaneous flap was flapped forward and held in place with fishhooks.  Bur holes were placed and were used to dissect the dura from the inner table of the skull.  Craniotome was used to perform a craniotomy.  Meticulous epidural hemostasis obtained.   The dura was then opened  and flapped forward.  Temporal corticectomy was made and cortex was sent for permanent section.  The microscope was introduced into the field to allow for microdissection.  Using intraoperative neuronavigation, a corridor through the temporal lobe to the lesion was performed using suction and bipolar.  The lesion was encountered and appeared to be situated in the amygdala.  It was noted to be hypervascular, and appeared to parasitize periventricular vessels.  Dissection was performed around the mass, which had a consistency similar to brain and appeared infiltrative.  It was removed en bloc.  There was no evidence of purulence but nevertheless a small piece was cut and sent for fungal and AFB culture.  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  Meticulous hemostasis was obtained.  As I was adjacent to the temporal horn, I decided to perform a temporal horn ventriculocisternostomy.  The lateral wall of the temporal horn was opened and the hippocampal head and choroid and choroidal fissure were identified.  A partial resection of the epicanthal  head was performed and the pia arachnoid of the ambient cistern was identified.  This was opened sharply and adequate opening to allow for  ventricular communication with the cisterns was made.  Meticulous hemostasis was obtained.  The dura was then closed with 4-0 Nurolon as well as DuraGen plus and dural spray.  The bone flap was replaced with Stryker cranial plating system.  The wound was irrigated thoroughly.  Temporalis muscle was reapproximated 2-0 Vicryl stitches.  The galea was closed with 2-0 Vicryl sutures in buried fashion.  Skin was closed with staples.  Sterile dressing was then placed on the incision.  Patient was then extubated by the anesthesia service moving all extremities.  All counts were correct at the end of surgery.  No complications were noted.  Findings: No infection grossly.  My impression is infiltrative neoplastic (such as glioma or lymphoma) vs hypervascular inflammatory tissue   Estimated Blood Loss:  less than 100 mL         Drains: none        Specimens:  Brain lesion         Implants: Stryker plating        Complications:  * No complications entered in OR log *         Disposition: PACU - hemodynamically stable.         Condition: stable

## 2022-10-31 ENCOUNTER — Inpatient Hospital Stay (HOSPITAL_COMMUNITY): Payer: No Typology Code available for payment source

## 2022-10-31 ENCOUNTER — Encounter (HOSPITAL_COMMUNITY): Payer: Self-pay | Admitting: Neurosurgery

## 2022-10-31 DIAGNOSIS — G030 Nonpyogenic meningitis: Secondary | ICD-10-CM | POA: Diagnosis not present

## 2022-10-31 LAB — GLUCOSE, CAPILLARY
Glucose-Capillary: 200 mg/dL — ABNORMAL HIGH (ref 70–99)
Glucose-Capillary: 186 mg/dL — ABNORMAL HIGH (ref 70–99)
Glucose-Capillary: 179 mg/dL — ABNORMAL HIGH (ref 70–99)
Glucose-Capillary: 153 mg/dL — ABNORMAL HIGH (ref 70–99)

## 2022-10-31 LAB — MISC LABCORP TEST (SEND OUT)

## 2022-10-31 MED ORDER — POTASSIUM CHLORIDE 10 MEQ/100ML IV SOLN: 10.0000 meq | INTRAVENOUS | Status: DC

## 2022-10-31 MED ORDER — INSULIN GLARGINE-YFGN 100 UNIT/ML ~~LOC~~ SOLN
30.0000 [IU] | Freq: Every day | SUBCUTANEOUS | Status: DC
  Administered 2022-10-31 – 2022-11-01 (×2): 30 [IU] via SUBCUTANEOUS
  Filled 2022-10-31 (×2): qty 0.3

## 2022-10-31 MED ORDER — ADULT MULTIVITAMIN W/MINERALS CH
1.0000 | ORAL_TABLET | Freq: Every day | ORAL | Status: DC
  Administered 2022-10-31 – 2022-11-02 (×3): 1 via ORAL
  Filled 2022-10-31 (×3): qty 1

## 2022-10-31 MED ORDER — CYANOCOBALAMIN 1000 MCG/ML IJ SOLN
1000.0000 ug | INTRAMUSCULAR | Status: DC
  Administered 2022-10-31: 1000 ug via SUBCUTANEOUS
  Filled 2022-10-31: qty 1

## 2022-10-31 MED ORDER — LORAZEPAM 2 MG/ML IJ SOLN: 2.0000 mg | Freq: Once | INTRAMUSCULAR | Status: DC

## 2022-10-31 MED ORDER — POTASSIUM CHLORIDE 10 MEQ/100ML IV SOLN
10.0000 meq | INTRAVENOUS | Status: AC
  Administered 2022-10-31 (×2): 10 meq via INTRAVENOUS
  Filled 2022-10-31 (×2): qty 100

## 2022-10-31 MED ORDER — LORAZEPAM 2 MG/ML IJ SOLN: 2.0000 mg | Freq: Once | INTRAMUSCULAR | Status: DC | PRN

## 2022-10-31 NOTE — Progress Notes (Signed)
Noticed pt's K+ from 5/14 at 0539 was 3.3. K+ x 4 was ordered for 5/14 @ 1330 but not given per MAR.  This RN paged Dr. Otelia Limes to ask about labs/ giving the potassium.  VO for BMP placed and drawn by lab.  Received new K+ value of 3.4.  Notified Dr. Otelia Limes.  VO for 2 runs of K+ ordered.

## 2022-10-31 NOTE — Progress Notes (Signed)
Patient back on unit.

## 2022-10-31 NOTE — Progress Notes (Addendum)
On later attending exam at 7 PM, patient found to have a right gaze preference, nystagmus, able to track to the left with difficulty, spontaneously antigravity in the BUE 4/5, withdrawing in the bilateral LE  - STAT Head CT    Hospital Summary:  Jeremy Bayse. is a 72 y.o. person living with a history of DM2, HTN, HLD, chronic back pain on methadone, PTSD, depression/anxiety on chronic benzos who has been undergoing a workup for rapid functional decline and progressive encephalopathy since the end of January 2024. He was initially independent and communicating well until the end of January when he had a sudden decline in functional status and progressive encephalopathy.  He was seen in the ED at [facility] on 08/01/2022 with 2 weeks of nausea, vomiting and lower abdominal pain. He has been on methadone for chronic back pain and had additionally been having issues with constipation.  His dose of methadone had been decreased around the same time.  He was also found to have an acute kidney injury and dehydration; however, he did not stay in the hospital for complete workup.  He returned a day later with increased confusion and then again in the next month after multiple falls and continued confusion. He was admitted to the [place] from 3/19 - 3/22.  An MRI on 3/20 at the [facility] was suggestive of communicating hydrocephalus and the patient was referred to Neurology and Neurosurgery in the outpatient setting.    He re-presented to the ED at [facility] on 4/12 after being seen by Dr. Maisie Fus at [facility] for consideration of a lumbar puncture w/ concern for NPH. LP performed with opening pressure elevated at 36 cm H2O, closing pressure 10 cm H2O. Initial CSF labs 60 WBC predominantly monocytes/macrophages (87%) with the remainder being lymphocytes, and no neutrophils. Protein markedly elevated at 365 mg/dL. Glucose 67. He had some improvement in his mental status after his LP on 4/25 and  removal of 15 cc of CSF. His clinical picture was concerning for fungal meningitis and was started on empiric coverage with amphotericin B liposome, which was felt to lead to some improvement but ultimately discontinued on 4/27. EEG performed consistent with nonspecific encephalopathy.   Extensive testing done; Cryptococcal antigen,histomyces antigen, blastomyces ag, cocci ab, MTB PCR, karius test, serum coccidioides ab, urine histoplasma ag, blastomyces ag, CSF PCR ID panel, fungal culture all were normal. Quantiferon gold and ACE level's also normal.   Protein 14 3 3/tau/RT quic testing resulted negative on 5/13,  5/14 completed R craniotomy for resective biopsy R temporal lobe lesion, and temporal lobe ventriculocisternostomy; prelim path concerning for malignancy  Subjective: This morning Jeremy Medina is sitting up right in bed, verbal, and not answering all questions appropriately. Able to follow some commands.   Exam: Vitals:   10/31/22 0700 10/31/22 0800  BP: (!) 143/128   Pulse: 85   Resp: 16   Temp:  (!) 96.9 F (36.1 C)  SpO2: 98%    Gen: In bed, NAD Cardiac: Regular rate and rhythm, no murmurs gallops rubs Resp: non-labored breathing, no acute distress Abd: soft, nt  Neuro: MS: Awake, alert and sluggish to answer questions. Is not oriented to place, time, or situation.  CN: Visual fields full, tracks across midline in both directions, face symmetric Motor: Moves all extremities relatively symmetrically Sensory: Responds to mild stimulation bilaterally  Pertinent Labs:    Latest Ref Rng & Units 10/30/2022    5:39 AM 10/26/2022    6:50  AM 10/25/2022    8:00 AM  CBC  WBC 4.0 - 10.5 K/uL 14.2  13.9  13.4   Hemoglobin 13.0 - 17.0 g/dL 04.5  40.9  81.1   Hematocrit 39.0 - 52.0 % 35.7  34.1  34.2   Platelets 150 - 400 K/uL 176  194  PLATELET CLUMPS NOTED ON SMEAR, UNABLE TO ESTIMATE        Latest Ref Rng & Units 10/30/2022   10:59 PM 10/30/2022    5:39 AM 10/26/2022    6:50  AM  BMP  Glucose 70 - 99 mg/dL 914  782  956   BUN 8 - 23 mg/dL 20  21  12    Creatinine 0.61 - 1.24 mg/dL 2.13  0.86  5.78   Sodium 135 - 145 mmol/L 138  138  139   Potassium 3.5 - 5.1 mmol/L 3.4  3.3  3.6   Chloride 98 - 111 mmol/L 104  103  104   CO2 22 - 32 mmol/L 23  26  26    Calcium 8.9 - 10.3 mg/dL 8.2  8.9  8.8      Impression: 72 year old male who presented with progressive encephalopathy with pathology preliminarily concerning for malignancy. Discussed with Dr. Barbaraann Cao neuro-oncology  Recommendations: - MRI cervical spine, lumbar spine and thoracic spine w/ and w/o contrast  - Appreciate assistance from Dr. Jeral Fruit DO  Internal Medicine Resident PGY-3 [place]  Pager: [phone]  Brooke Dare MD-PhD [facility] [phone] Available 7 AM to 7 PM, outside these hours please contact Neurologist on call listed on AMION   Attending Neurologist's note:  I personally saw this patient, gathering history, performing a neurologic examination, reviewing relevant labs, personally reviewing relevant imaging, and formulated the assessment and plan, adding the note above for completeness and clarity to accurately reflect my thoughts  Available 7 AM to 7 PM, outside these hours please contact Neurologist on call listed on AMION

## 2022-10-31 NOTE — Progress Notes (Signed)
Pt transferred to 4NP14. Report given to Almira Coaster, Charity fundraiser. Pt's son, [name], was made aware of transfer. All belongings taken to new room. Pt on monitor with call bell in reach when this RN left the room.   Robina Ade, RN

## 2022-10-31 NOTE — Progress Notes (Signed)
Patient OTF headed to CT

## 2022-10-31 NOTE — Progress Notes (Signed)
Patient now back on unit.

## 2022-10-31 NOTE — TOC Progression Note (Signed)
Transition of Care (TOC) - Progression Note    Patient Details  Name: Jeremy Medina. MRN: [id number] Date of Birth: 08/03/1950  Transition of Care [facility]) CM/SW Contact  Mearl Latin, LCSW Phone Number: 10/31/2022, 9:26 AM  Clinical Narrative:    Patient transferred to 4N. CSW will continue to follow for medical stability.    Expected Discharge Plan: Skilled Nursing Facility Barriers to Discharge: Continued Medical Work up  Expected Discharge Plan and Services In-house Referral: Clinical Social Work     Living arrangements for the past 2 months: Single Family Home                                       Social Determinants of Health (SDOH) Interventions SDOH Screenings   Food Insecurity: Patient Unable To Answer (09/29/2022)  Transportation Needs: Patient Unable To Answer (09/29/2022)  Utilities: Patient Unable To Answer (09/29/2022)  Depression (PHQ2-9): Low Risk  (04/27/2019)  Tobacco Use: High Risk (09/29/2022)    Readmission Risk Interventions     No data to display

## 2022-10-31 NOTE — Progress Notes (Signed)
[facility] Progress Note Patient: Jeremy Medina. ZOX:[id number] DOB: 03-Jul-1950 DOA: 09/28/2022  DOS: the patient was seen and examined on 10/31/2022  Brief hospital course: 72 year old M with PMH of DM-2, HTN, pancreatic insufficiency, GERD, chronic pain on methadone, PTSD and anxiety sent to ED from neurosurgery clinic for progressive subacute encephalopathy on 4/12.  Patient was recently hospitalized at the [facility] for subacute encephalopathy 3 months back and was thought to have NPH.  He was discharged home with neurology and neurosurgery follow-up.  CT head, MRI brain without contrast and MRV without acute finding.  Neurology consulted.  No improvement with IV thiamine.  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   LP on 4/17 and 4/25 didn't pinpoint etiology.  CSF studies including CJD unrevealing.  He also had CT chest, abdomen and pelvis that was negative for malignancy.  Neurosurgery indicated that patient is poor candidate for shunt placement on 4/30.  Neurology recommended tissue biopsy. Patient underwent right craniotomy for excision of temporal lobe mass and ventriculocisternostomy on 5/14. Assessment and Plan: Progressive subacute encephalopathy:  Initially concern for NPH LP showed opening pressure more than 30 cm ruling out NPH. No improvement after LP x 2. No improvement after IV thiamine dose. Treated with B12 injections, no improvement after improvement in B12 levels. Some concern about fungal meningitis at some point but felt to be less likely.  Neurology following.  Miscellaneous labs were sent for paraneoplastic leg syndrome. CJD labs were also sent.  Currently pending. Concern is that patient is suffering from destructive CNS inflammatory process in the presence of masses seen on the MRI. Neurology recommended biopsy with  neurosurgery. Underwent right craniotomy with excision of the temporal lobe mass and ventriculocisternostomy on 5/14 with Dr. Maisie Fus. Per son who is at bedside on 5/15 mentation improved significantly.  Patient able to answer questions adequately but not appropriately. -Reorientation and delirium precaution -Management per neurology and neurosurgery.   Normocytic anemia/BRBPR:  Acute blood loss secondary to stercoral ulcer. H&H stable for now. GI was consulted.  No indication for EGD or colonoscopy for now.  Uncontrolled IDDM-2 with hyperglycemia: With long-term insulin use at home with neuropathy. A1c 9.7% on 3/28.  On 70/30 insulin at home. Continue Semglee.  Dose reduced to 30 units. Patient was on dextrose fluid which I will discontinue. Continue sliding scale insulin.   Hypokalemia/hypomagnesemia Monitor replenish as appropriate   Chronic pain syndrome Continue methadone 10 mg 3 times daily   Transaminitis:  Ultrasound normal.  Will recheck.   Fecal impaction/constipation:  Fecal impaction solved. -Continue bowel regimen.   Benign essential hypertension -Continue amlodipine -Hydralazine as needed   Folate deficiency  -Continue folate supplement.   Anxiety:  Stable.   Goals of care discussion.  Palliative care on board.    Pancreatic insufficiency -Continue on Creon   Obesity Body mass index is 32.27 kg/m.  Placing the patient at high risk of poor outcome.   Subjective: No nausea or vomiting.  No chest pain. Unable to tell the year accurately, told me his last name, follows all commands, identify his son appropriately.  Physical Exam: General: in Mild distress, No Rash Cardiovascular: S1 and S2 Present, No Murmur Respiratory: Good respiratory effort, Bilateral Air entry present. No Crackles, No wheezes Abdomen: Bowel Sound present, No tenderness Extremities: No edema Neuro: Alert and oriented x2, no new focal deficit  Data Reviewed: I have Reviewed  nursing notes, Vitals,  and Lab results. Since last encounter, pertinent lab results CBC and BMP   . I have ordered test including CBC and CMP  .   Disposition: Status is: Inpatient Remains inpatient appropriate because: Further workup and improvement in mentation.  SCDs Start: 10/30/22 1818 SCD's Start: 10/30/22 1818 SCDs Start: 10/21/22 0604 Place and maintain sequential compression device Start: 10/02/22 0808   Family Communication: Son at bedside Level of care: Progressive switch from ICU to progressive. Vitals:   10/31/22 1200 10/31/22 1300 10/31/22 1400 10/31/22 1500  BP: 121/66 126/69 126/66 125/79  Pulse: 86 80 77 (!) 190  Resp: 19 16 14 10   Temp:      TempSrc:      SpO2: 100% 100% 100% 96%  Weight:      Height:         Author: Lynden Oxford, MD 10/31/2022 3:49 PM  Please look on [email or web address] to find out who is on call.

## 2022-10-31 NOTE — Progress Notes (Signed)
Patient now OTF for MRI

## 2022-10-31 NOTE — Progress Notes (Signed)
This RN was told by MRI tech that patient would not be lie still for MRI triple scan. MRI tech stated they can hold the patient until RN is able to get medication for patient.   RN obtain verbal order for patient to receive 2mg  Ativan IV for MRI. RN called MRI back and told them this RN can be down their in about 10 minutes. This RN was then told patients routine scan will have to be down tomorrow as they are going from 3 scanners to 1 scanner and wont have time to complete test tonight. RN paged Otelia Limes MD, who stated it was okay to obtain scan in the morning.

## 2022-10-31 NOTE — Progress Notes (Signed)
Subjective: No acute events overnight.   Objective: Vital signs in last 24 hours: Temp:  [96.9 F (36.1 C)-98.1 F (36.7 C)] 96.9 F (36.1 C) (05/15 0800) Pulse Rate:  [76-95] 85 (05/15 0800) Resp:  [9-23] 12 (05/15 0800) BP: (112-147)/(60-128) 131/81 (05/15 0800) SpO2:  [94 %-100 %] 99 % (05/15 0800) Weight:  [102 kg] 102 kg (05/14 1238)  Intake/Output from previous day: 05/14 0701 - 05/15 0700 In: 2363.2 [I.V.:1863.2; IV Piggyback:500.1] Out: 3075 [Urine:3075] Intake/Output this shift: Total I/O In: 297.2 [I.V.:197.2; IV Piggyback:100] Out: 300 [Urine:300]  Alert Speech fluent Responsive to commands all 4 extremities PERRLA CN grossly intact  Dressing c/d/i  Lab Results: Recent Labs    10/30/22 0539  WBC 14.2*  HGB 11.6*  HCT 35.7*  PLT 176   BMET Recent Labs    10/30/22 0539 10/30/22 2259  NA 138 138  K 3.3* 3.4*  CL 103 104  CO2 26 23  GLUCOSE 119* 332*  BUN 21 20  CREATININE 0.94 1.14  CALCIUM 8.9 8.2*    Studies/Results: CT HEAD WO CONTRAST  Result Date: 10/31/2022 CLINICAL DATA:  72 year old male with a history of rapidly progressive cognitive decline. Status post craniotomy, excision ule excision a biopsy postoperative day 1. Widespread recent abnormal leptomeningeal enhancement on MRI, enhancing right mesial temporal lobe lesion. EXAM: CT HEAD WITHOUT CONTRAST TECHNIQUE: Contiguous axial images were obtained from the base of the skull through the vertex without intravenous contrast. RADIATION DOSE REDUCTION: This exam was performed according to the departmental dose-optimization program which includes automated exposure control, adjustment of the mA and/or kV according to patient size and/or use of iterative reconstruction technique. COMPARISON:  Preoperative MRI 510 24. Preoperative head CT 10/29/2022. FINDINGS: Brain: Trace pneumocephalus along the non dependent anterior frontal lobes. Stable cerebral volume. Small anterior right temporal lobe  resection cavity with gas and increased hypodensity series 3, image 13. And the resection continues laterally through the right inferior or middle temporal gyrus. Stable ventricle size and configuration. Nonspecific periventricular hypodensity continues and is confluent. No convincing acute intracranial hemorrhage. No intracranial mass effect or midline shift. No cortically based acute infarct identified. Basilar cisterns remain patent. Vascular: No suspicious intracranial vascular hyperdensity. Skull: Right temporal craniotomy with bone fasteners in place. No superimposed acute or suspicious osseous lesion. Sinuses/Orbits: Visualized paranasal sinuses and mastoids are stable and well aerated. Other: Postoperative changes to the right scalp with skin staples in place. Orbits soft tissues are stable, negative. IMPRESSION: 1. Satisfactory postoperative changes of right temporal craniotomy, small right anterior temporal lobe resection. Trace postoperative pneumocephalus. 2. Otherwise stable non contrast CT appearance of the brain. No new intracranial abnormality. Electronically Signed   By: Odessa Fleming M.D.   On: 10/31/2022 06:37   CT DBS HEAD W/O CONTRAST ( )  Result Date: 10/29/2022 CLINICAL DATA:  Brain lesion. EXAM: CT HEAD WITHOUT CONTRAST TECHNIQUE: Contiguous axial images were obtained from the base of the skull through the vertex without intravenous contrast. RADIATION DOSE REDUCTION: This exam was performed according to the departmental dose-optimization program which includes automated exposure control, adjustment of the mA and/or kV according to patient size and/or use of iterative reconstruction technique. COMPARISON:  MRI brain 10/26/2022.  Head CT 09/28/2022. FINDINGS: Brain: No acute intracranial hemorrhage. Unchanged severe chronic small-vessel disease. Unchanged disproportionate prominence of the ventricles relative to the sulci with acute callosal angle, suggestive of idiopathic normal pressure  hydrocephalus. No extra-axial collection. No mass effect or midline shift. Vascular: No hyperdense vessel or unexpected calcification.  Skull: No calvarial fracture or suspicious bone lesion. Skull base is unremarkable. Sinuses/Orbits: Unremarkable. Other: None. IMPRESSION: 1. No acute intracranial abnormality. 2. Unchanged severe chronic small-vessel disease. 3. Unchanged disproportionate prominence of the ventricles relative to the sulci with acute callosal angle, suggestive of idiopathic normal pressure hydrocephalus. Electronically Signed   By: Orvan Falconer M.D.   On: 10/29/2022 21:37    Assessment/Plan: Pt s/p R craniotomy for resective biopsy R temporal lobe lesion, and temporal lobe ventriculocisternostomy POD#1.    LOS: 29 days  -post op CT w/o complication.  -continue supportive care -will step down pt to progressive unit today.    Lindon Kiel CAYLIN Mikella Linsley 10/31/2022, 10:22 AM

## 2022-10-31 NOTE — Evaluation (Signed)
Physical Therapy Evaluation Patient Details Name: Jeremy Medina. MRN: [id number] DOB: Mar 05, 1951 Today's Date: 10/31/2022  History of Present Illness  Jeremy Medina. is a 72 y.o. male admitted 09/28/22 with altered mental status and progressive decline.  MRI performed at [facility], concern for hydrocephalus, LP 10/03/2022.  MRI found ventriculomegaly, leptomeningeal enhancement of cerebrum, cerebellum and brainstem and some cranial nerves.  Underwent R temporal craniotomy with excision of temporal lobe mass/lesion and R ventriculocisternostomy on 10/30/22.  PMHx: type 2 diabetes, PTSD, chronic pain on methadone, hypertension, hyperlipidemia.  Clinical Impression  Patient presents with decreased mobility, though improved from last session following R temporal craniotomy and ventriculocisternotomy.  He was able to communicate verbally and improved with command following over previous sessions prior to crani.  Patient still with posterior lean and seems to have pain in back.  Feel he may benefit from skilled PT in the acute setting and from return to inpatient rehab (<3 hr/day) at d/c. Also son present and engaged and able to assist upon eventual d/c back home.      Recommendations for follow up therapy are one component of a multi-disciplinary discharge planning process, led by the attending physician.  Recommendations may be updated based on patient status, additional functional criteria and insurance authorization.  Follow Up Recommendations Can patient physically be transported by private vehicle: No     Assistance Recommended at Discharge Frequent or constant Supervision/Assistance  Patient can return home with the following  Two people to help with walking and/or transfers;Assistance with cooking/housework;Help with stairs or ramp for entrance;Assist for transportation;Two people to help with bathing/dressing/bathroom    Equipment Recommendations Other (comment) (TBA)  Recommendations for  Other Services       Functional Status Assessment Patient has had a recent decline in their functional status and/or demonstrates limited ability to make significant improvements in function in a reasonable and predictable amount of time     Precautions / Restrictions Precautions Precautions: Fall Precaution Comments: posterior lean; R ventriculosternostomy Restrictions Weight Bearing Restrictions: No      Mobility  Bed Mobility Overal bed mobility: Needs Assistance Bed Mobility: Rolling, Sidelying to Sit, Sit to Sidelying Rolling: Max assist, +2 for safety/equipment Sidelying to sit: Total assist, +2 for physical assistance     Sit to sidelying: Total assist, +2 for physical assistance General bed mobility comments: cues for technique, assist for legs off bed and to lift trunk with pt pushing posterior, assist to side with help for trunk and legs    Transfers Overall transfer level: Needs assistance Equipment used: None              Lateral/Scoot Transfers: +2 physical assistance, Total assist General transfer comment: pt did not assist with scooting up toward HOB pushing posterior and limited weight acceptance through legs    Ambulation/Gait                  Stairs            Wheelchair Mobility    Modified Rankin (Stroke Patients Only)       Balance Overall balance assessment: Needs assistance Sitting-balance support: Feet supported, Bilateral upper extremity supported Sitting balance-Leahy Scale: Zero Sitting balance - Comments: at times pushing posterior on EOB needing A for anterior lean and for reducing retrupulsion relaxing some after supported in upright sitting, needing assist to initiate any anterior weight shift Postural control: Posterior lean  Pertinent Vitals/Pain Pain Assessment Pain Assessment: Faces Faces Pain Scale: Hurts whole lot Pain Location: back with movement Pain  Descriptors / Indicators: Grimacing, Moaning, Discomfort Pain Intervention(s): Monitored during session, Limited activity within patient's tolerance, Repositioned    Home Living Family/patient expects to be discharged to:: Private residence Living Arrangements: Children Available Help at Discharge: Family;Available 24 hours/day Type of Home: House   Entrance Stairs-Rails:  Henri Medal) Entrance Stairs-Number of Steps: 2   Home Layout: One level Home Equipment: Grab bars - tub/shower;Rollator (4 wheels);Gilmer Mor - single point Additional Comments: information from son, [name]    Prior Function               Mobility Comments: would use the walker when his back would hurt ADLs Comments: does drive at times, did chores and bills until January     Hand Dominance   Dominant Hand: Right    Extremity/Trunk Assessment   Upper Extremity Assessment Upper Extremity Assessment: Defer to OT evaluation    Lower Extremity Assessment Lower Extremity Assessment: RLE deficits/detail;LLE deficits/detail RLE Deficits / Details: AAROM WFL, strength <3/5 LLE Deficits / Details: AAROM WFL, strength <3/5    Cervical / Trunk Assessment Cervical / Trunk Assessment: Normal  Communication   Communication: No difficulties  Cognition Arousal/Alertness: Awake/alert Behavior During Therapy: Restless Overall Cognitive Status: Impaired/Different from baseline Area of Impairment: Orientation, Following commands, Safety/judgement, Attention, Memory, Problem solving                 Orientation Level: Disoriented to, Place, Time, Situation Current Attention Level: Focused Memory: Decreased short-term memory Following Commands: Follows one step commands inconsistently, Follows one step commands with increased time Safety/Judgement: Decreased awareness of safety, Decreased awareness of deficits   Problem Solving: Slow processing, Decreased initiation, Requires verbal cues, Requires tactile cues,  Difficulty sequencing General Comments: stated 1994, then after oriented later stated 1972; follows one step commands throughout with increased time        General Comments General comments (skin integrity, edema, etc.): VSS, son in the room assisting and supportive; reports pt exhausted as up half the night    Exercises     Assessment/Plan    PT Assessment Patient needs continued PT services  PT Problem List Decreased strength;Decreased balance;Decreased cognition;Pain;Decreased activity tolerance;Decreased safety awareness;Decreased knowledge of use of DME;Decreased mobility       PT Treatment Interventions DME instruction;Gait training;Functional mobility training;Therapeutic activities;Therapeutic exercise;Balance training;Patient/family education;Cognitive remediation    PT Goals (Current goals can be found in the Care Plan section)  Acute Rehab PT Goals Patient Stated Goal: to get more mobile PT Goal Formulation: With family Time For Goal Achievement:  Potential to Achieve Goals: Fair    Frequency Min 3X/week     Co-evaluation PT/OT/SLP Co-Evaluation/Treatment: Yes Reason for Co-Treatment: Complexity of the patient's impairments (multi-system involvement);Necessary to address cognition/behavior during functional activity;For patient/therapist safety;To address functional/ADL transfers PT goals addressed during session: Mobility/safety with mobility;Balance;Strengthening/ROM         AM-PAC PT "6 Clicks" Mobility  Outcome Measure Help needed turning from your back to your side while in a flat bed without using bedrails?: Total Help needed moving from lying on your back to sitting on the side of a flat bed without using bedrails?: Total Help needed moving to and from a bed to a chair (including a wheelchair)?: Total Help needed standing up from a chair using your arms (e.g., wheelchair or bedside chair)?: Total Help needed to walk in hospital room?: Total Help  needed climbing 3-5  steps with a railing? : Total 6 Click Score: 6    End of Session Equipment Utilized During Treatment: Gait belt Activity Tolerance: Patient limited by pain Patient left: in bed;with call bell/phone within reach;with family/visitor present;with bed alarm set;with restraints reapplied Nurse Communication: Mobility status PT Visit Diagnosis: Other abnormalities of gait and mobility (R26.89);Other symptoms and signs involving the nervous system (R29.898);Pain Pain - part of body:  (back)    Time: 1610-9604 PT Time Calculation (min) (ACUTE ONLY): 22 min   Charges:   PT Evaluation $PT Eval Moderate Complexity: 1 Mod          Cyndi Adelae Yodice, PT Acute Rehabilitation Services Office:[phone] 10/31/2022   Elray Mcgregor 10/31/2022, 10:54 AM

## 2022-10-31 NOTE — Evaluation (Signed)
Occupational Therapy Evaluation Patient Details Name: Jeremy Medina. MRN: [id number] DOB: 07/04/1950 Today's Date: 10/31/2022   History of Present Illness Jeremy Bage. is a 72 y.o. male admitted 09/28/22 with altered mental status and progressive decline.  MRI performed at [facility], concern for hydrocephalus, LP 10/03/2022.  MRI found ventriculomegaly, leptomeningeal enhancement of cerebrum, cerebellum and brainstem and some cranial nerves.  Underwent R temporal craniotomy with excision of temporal lobe mass/lesion and R ventriculocisternostomy on 10/30/22.  PMHx: type 2 diabetes, PTSD, chronic pain on methadone, hypertension, hyperlipidemia.   Clinical Impression   Prior to this admission, patient was living with his son, would occasionally walk with a walker when his back pain was worse, and would occasionally drive and manage bills. Patient has been living with his son since Jan of 24 and has a steady decline and increased falls since. Patient is be re-evaled to date due to R crani and ventriculostomy procedure. Currently, patient presenting with confusion, increased time to follow all commands (though inconsistent), total A for ADLs and total A of 2 for bed mobility and attempt to stand x2. Patient stating it was 62, then 1972 despite reorientation and increased time spent to promote increased mentation. Patient also with difficulty maintain attention to any tasks or commands, such as a visual assessment, therefore inconsistent visual information was obtained. Patient with back pain sitting EOB, with posterior lean noted. Patient could correct posterior lean with multi-modal cues, however no initiation to attempt stand/lateral scoot toward HOB. OT currently recommending rehab services < 3 hours due to current level. OT will continue to follow acutely, with goals updated to reflect current level.      Recommendations for follow up therapy are one component of a multi-disciplinary discharge  planning process, led by the attending physician.  Recommendations may be updated based on patient status, additional functional criteria and insurance authorization.   Assistance Recommended at Discharge Frequent or constant Supervision/Assistance  Patient can return home with the following Two people to help with walking and/or transfers;Two people to help with bathing/dressing/bathroom;Assistance with cooking/housework;Assistance with feeding;Direct supervision/assist for medications management;Direct supervision/assist for financial management;Assist for transportation;Help with stairs or ramp for entrance    Functional Status Assessment  Patient has had a recent decline in their functional status and demonstrates the ability to make significant improvements in function in a reasonable and predictable amount of time.  Equipment Recommendations  Other (comment) (defer to next venue of care)    Recommendations for Other Services       Precautions / Restrictions Precautions Precautions: Fall Precaution Comments: posterior lean; R ventriculosternostomy Restrictions Weight Bearing Restrictions: No      Mobility Bed Mobility Overal bed mobility: Needs Assistance Bed Mobility: Rolling, Sidelying to Sit, Sit to Sidelying Rolling: Max assist, +2 for safety/equipment Sidelying to sit: Total assist, +2 for physical assistance     Sit to sidelying: Total assist, +2 for physical assistance General bed mobility comments: cues for technique, assist for legs off bed and to lift trunk with pt pushing posterior, assist to side with help for trunk and legs    Transfers Overall transfer level: Needs assistance Equipment used: None              Lateral/Scoot Transfers: +2 physical assistance, Total assist General transfer comment: pt did not assist with scooting up toward HOB pushing posterior and limited weight acceptance through legs      Balance Overall balance assessment: Needs  assistance Sitting-balance support: Feet supported, Bilateral upper  extremity supported Sitting balance-Leahy Scale: Zero Sitting balance - Comments: at times pushing posterior on EOB needing A for anterior lean and for reducing retrupulsion relaxing some after supported in upright sitting, needing assist to initiate any anterior weight shift Postural control: Posterior lean                                 ADL either performed or assessed with clinical judgement   ADL Overall ADL's : Needs assistance/impaired Eating/Feeding: Total assistance   Grooming: Wash/dry hands;Wash/dry face;Minimal assistance Grooming Details (indicate cue type and reason): min A to make sure patient did not agitate bandage on head Upper Body Bathing: Maximal assistance;Sitting   Lower Body Bathing: Total assistance;Sitting/lateral leans;Sit to/from stand   Upper Body Dressing : Maximal assistance;Sitting   Lower Body Dressing: Total assistance;Cueing for sequencing;Cueing for safety   Toilet Transfer: Total assistance;+2 for physical assistance;+2 for safety/equipment Toilet Transfer Details (indicate cue type and reason): attempted sit<>stands x2, significant posterior lean noted Toileting- Clothing Manipulation and Hygiene: Total assistance;+2 for safety/equipment;Sit to/from stand       Functional mobility during ADLs: Total assistance;Cueing for safety;Cueing for sequencing General ADL Comments: Patient presenting with confusion, increased time to follow all commands (though inconsistent), total A for ADLs and total A of 2 for bed mobility and attempt to stand x2.     Vision Baseline Vision/History: 1 Wears glasses (Readers) Ability to See in Adequate Light: 0 Adequate Patient Visual Report: Other (comment) (patient is unable to state) Vision Assessment?: Yes Eye Alignment: Within Functional Limits Ocular Range of Motion: Impaired-to be further tested in functional  context Alignment/Gaze Preference: Head tilt Tracking/Visual Pursuits: Requires cues, head turns, or add eye shifts to track Saccades: Impaired - to be further tested in functional context Convergence: Impaired - to be further tested in functional context Additional Comments: difficulty following commands to get full assessment, did attempt to track in all fields, but attention is so impaired patient requires constant cues or will look off into the distance as if he had forgotten the task     Perception Perception Perception Tested?: No   Praxis Praxis Praxis tested?: Not tested    Pertinent Vitals/Pain Pain Assessment Pain Assessment: Faces Faces Pain Scale: Hurts whole lot Pain Location: back with movement Pain Descriptors / Indicators: Grimacing, Moaning, Discomfort Pain Intervention(s): Limited activity within patient's tolerance, Monitored during session, Repositioned     Hand Dominance Right   Extremity/Trunk Assessment Upper Extremity Assessment Upper Extremity Assessment: Generalized weakness RUE Deficits / Details: 3/5 strength, good grip strength noted, difficulty following commands to do full assessment against gravity sitting EOB LUE Deficits / Details: 3/5 strength, good grip strength noted, difficulty following commands to do full assessment against gravity sitting EOB   Lower Extremity Assessment Lower Extremity Assessment: Defer to PT evaluation RLE Deficits / Details: AAROM WFL, strength <3/5 LLE Deficits / Details: AAROM WFL, strength <3/5   Cervical / Trunk Assessment Cervical / Trunk Assessment: Normal   Communication Communication Communication: No difficulties   Cognition Arousal/Alertness: Awake/alert Behavior During Therapy: Restless Overall Cognitive Status: Impaired/Different from baseline Area of Impairment: Orientation, Attention, Following commands, Safety/judgement, Awareness, Problem solving, Memory                 Orientation Level:  Place, Time, Situation Current Attention Level: Focused Memory: Decreased short-term memory Following Commands: Follows one step commands inconsistently, Follows one step commands with increased time Safety/Judgement: Decreased  awareness of safety, Decreased awareness of deficits Awareness: Intellectual Problem Solving: Slow processing, Decreased initiation, Requires verbal cues, Requires tactile cues, Difficulty sequencing General Comments: stated 1994, then after oriented later stated 1972; follows one step commands throughout with increased time     General Comments  VSS, son in the room assisting and supportive; reports pt exhausted as up half the night    Exercises     Shoulder Instructions      Home Living Family/patient expects to be discharged to:: Private residence Living Arrangements: Children Available Help at Discharge: Family;Available 24 hours/day Type of Home: House   Entrance Stairs-Number of Steps: 2 Entrance Stairs-Rails:  Henri Medal) Home Layout: One level     Bathroom Shower/Tub: Producer, television/film/video: Standard     Home Equipment: Grab bars - tub/shower;Rollator (4 wheels);Gilmer Mor - single point   Additional Comments: information from son, [name]      Prior Functioning/Environment Prior Level of Function : Independent/Modified Independent             Mobility Comments: would use the walker when his back would hurt ADLs Comments: does drive at times, did chores and bills until January        OT Problem List: Decreased strength;Decreased range of motion;Decreased activity tolerance;Impaired balance (sitting and/or standing);Impaired vision/perception;Decreased coordination;Decreased cognition;Decreased safety awareness;Decreased knowledge of use of DME or AE;Decreased knowledge of precautions;Pain      OT Treatment/Interventions: Self-care/ADL training;Therapeutic exercise;Neuromuscular education;Energy conservation;DME and/or AE  instruction;Manual therapy;Therapeutic activities;Cognitive remediation/compensation;Visual/perceptual remediation/compensation;Patient/family education;Balance training    OT Goals(Current goals can be found in the care plan section) Acute Rehab OT Goals Patient Stated Goal: unable to state OT Goal Formulation: Patient unable to participate in goal setting Time For Goal Achievement:  Potential to Achieve Goals: Good  OT Frequency: Min 2X/week    Co-evaluation PT/OT/SLP Co-Evaluation/Treatment: Yes Reason for Co-Treatment: Complexity of the patient's impairments (multi-system involvement);Necessary to address cognition/behavior during functional activity;For patient/therapist safety;To address functional/ADL transfers PT goals addressed during session: Mobility/safety with mobility;Balance;Strengthening/ROM OT goals addressed during session: ADL's and self-care;Strengthening/ROM      AM-PAC OT "6 Clicks" Daily Activity     Outcome Measure Help from another person eating meals?: Total Help from another person taking care of personal grooming?: A Little Help from another person toileting, which includes using toliet, bedpan, or urinal?: Total Help from another person bathing (including washing, rinsing, drying)?: Total Help from another person to put on and taking off regular upper body clothing?: A Lot Help from another person to put on and taking off regular lower body clothing?: Total 6 Click Score: 9   End of Session Equipment Utilized During Treatment: Gait belt Nurse Communication: Mobility status  Activity Tolerance: Patient limited by fatigue;Patient limited by lethargy;Patient limited by pain Patient left: in bed;with call bell/phone within reach;with family/visitor present;with nursing/sitter in room;with restraints reapplied  OT Visit Diagnosis: Unsteadiness on feet (R26.81);Other abnormalities of gait and mobility (R26.89);Muscle weakness (generalized)  (M62.81);Pain;Other symptoms and signs involving cognitive function;Adult, failure to thrive (R62.7)                Time: 1610-9604 OT Time Calculation (min): 22 min Charges:  OT General Charges $OT Visit: 1 Visit OT Evaluation $OT Eval Moderate Complexity: 1 Mod  Pollyann Glen E. Flavius Repsher, OTR/L Acute Rehabilitation Services [phone]   Cherlyn Cushing 10/31/2022, 12:52 PM

## 2022-11-01 ENCOUNTER — Other Ambulatory Visit: Payer: Self-pay | Admitting: Radiation Therapy

## 2022-11-01 DIAGNOSIS — G030 Nonpyogenic meningitis: Secondary | ICD-10-CM | POA: Diagnosis not present

## 2022-11-01 LAB — GLUCOSE, CAPILLARY
Glucose-Capillary: 155 mg/dL — ABNORMAL HIGH (ref 70–99)
Glucose-Capillary: 165 mg/dL — ABNORMAL HIGH (ref 70–99)
Glucose-Capillary: 75 mg/dL (ref 70–99)
Glucose-Capillary: 171 mg/dL — ABNORMAL HIGH (ref 70–99)
Glucose-Capillary: 262 mg/dL — ABNORMAL HIGH (ref 70–99)
Glucose-Capillary: 242 mg/dL — ABNORMAL HIGH (ref 70–99)

## 2022-11-01 LAB — BASIC METABOLIC PANEL
Anion gap: 9 (ref 5–15)
BUN: 16 mg/dL (ref 8–23)
CO2: 25 mmol/L (ref 22–32)
Calcium: 8.4 mg/dL — ABNORMAL LOW (ref 8.9–10.3)
Chloride: 106 mmol/L (ref 98–111)
Creatinine, Ser: 0.83 mg/dL (ref 0.61–1.24)
GFR, Estimated: >60 mL/min (ref >60)
Glucose, Bld: 122 mg/dL — ABNORMAL HIGH (ref 70–99)
Potassium: 3 mmol/L — ABNORMAL LOW (ref 3.5–5.1)
Sodium: 140 mmol/L (ref 135–145)

## 2022-11-01 LAB — CBC
HCT: 29.2 % — ABNORMAL LOW (ref 39.0–52.0)
Hemoglobin: 9.5 g/dL — ABNORMAL LOW (ref 13.0–17.0)
MCH: 28.9 pg (ref 26.0–34.0)
MCHC: 32.5 g/dL (ref 30.0–36.0)
MCV: 88.8 fL (ref 80.0–100.0)
Platelets: 123 10*3/uL — ABNORMAL LOW (ref 150–400)
RBC: 3.29 MIL/uL — ABNORMAL LOW (ref 4.22–5.81)
RDW: 17.2 % — ABNORMAL HIGH (ref 11.5–15.5)
WBC: 12.9 10*3/uL — ABNORMAL HIGH (ref 4.0–10.5)
nRBC: 0 % (ref 0.0–0.2)

## 2022-11-01 LAB — ACID FAST SMEAR (AFB, MYCOBACTERIA): Acid Fast Smear: NEGATIVE

## 2022-11-01 LAB — MAGNESIUM: Magnesium: 2 mg/dL (ref 1.7–2.4)

## 2022-11-01 MED ORDER — BISACODYL 10 MG RE SUPP: 10.0000 mg | Freq: Every day | RECTAL | Status: DC | PRN

## 2022-11-01 MED ORDER — POTASSIUM CHLORIDE CRYS ER 20 MEQ PO TBCR
40.0000 meq | EXTENDED_RELEASE_TABLET | ORAL | Status: AC
  Administered 2022-11-01 (×2): 40 meq via ORAL
  Filled 2022-11-01 (×2): qty 2

## 2022-11-01 MED ORDER — SENNOSIDES-DOCUSATE SODIUM 8.6-50 MG PO TABS
1.0000 | ORAL_TABLET | Freq: Every day | ORAL | Status: DC
  Administered 2022-11-01: 1 via ORAL
  Filled 2022-11-01: qty 1

## 2022-11-01 MED ORDER — INSULIN ASPART 100 UNIT/ML IJ SOLN
0.0000 [IU] | INTRAMUSCULAR | Status: DC
  Administered 2022-11-01: 5 [IU] via SUBCUTANEOUS
  Administered 2022-11-01: 2 [IU] via SUBCUTANEOUS
  Administered 2022-11-01: 3 [IU] via SUBCUTANEOUS
  Administered 2022-11-02: 2 [IU] via SUBCUTANEOUS
  Administered 2022-11-02: 3 [IU] via SUBCUTANEOUS

## 2022-11-01 MED ORDER — LEVETIRACETAM 500 MG PO TABS
500.0000 mg | ORAL_TABLET | Freq: Two times a day (BID) | ORAL | Status: DC
  Administered 2022-11-01 – 2022-11-02 (×2): 500 mg via ORAL
  Filled 2022-11-01 (×2): qty 1

## 2022-11-01 MED ORDER — LACTULOSE 10 GM/15ML PO SOLN
20.0000 g | Freq: Every day | ORAL | Status: DC
  Administered 2022-11-01 – 2022-11-02 (×2): 20 g via ORAL
  Filled 2022-11-01 (×2): qty 30

## 2022-11-01 NOTE — Plan of Care (Addendum)
Neurology Progress Note  Subjective: Minimally interactive   Exam: Vitals:   11/01/22 1525 11/01/22 1934  BP: 127/66 109/65  Pulse: 77 83  Resp: 12 11  Temp: 97.8 F (36.6 C) 97.6 F (36.4 C)  SpO2: 100% 100%   Gen: In bed, comfortable  Resp: non-labored breathing, no grossly audible wheezing Cardiac: Perfusing extremities well  Abd: soft, nt  Neuro: MS: Sleeping, awakens to light touch.  Nonverbal, not following commands CN: Right gaze preference, tracks to midline but not to the left.  Orients to voice.  Does not open mouth or protrude tongue Motor/sensory: Uses bilateral upper extremities grossly equally antigravity.  Trace movement of the bilateral lower extremities to light noxious stim.  Grossly equally reactive to light stim in the upper extremities  Pertinent Labs:  Basic Metabolic Panel: Recent Labs  Lab 10/26/22 0650 10/30/22 0539 10/30/22 2259 11/01/22 0251  NA 139 138 138 140  K 3.6 3.3* 3.4* 3.0*  CL 104 103 104 106  CO2 26 26 23 25   GLUCOSE 149* 119* 332* 122*  BUN 12 21 20 16   CREATININE 1.05 0.94 1.14 0.83  CALCIUM 8.8* 8.9 8.2* 8.4*  MG 1.7 1.9  --  2.0  PHOS 4.0 3.9  --   --     CBC: Recent Labs  Lab 10/26/22 0650 10/30/22 0539 11/01/22 0251  WBC 13.9* 14.2* 12.9*  HGB 11.3* 11.6* 9.5*  HCT 34.1* 35.7* 29.2*  MCV 86.1 88.6 88.8  PLT 194 176 123*    Coagulation Studies: No results for input(s): "LABPROT", "INR" in the last 72 hours.    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  Per neurosurgery note he was following commands this morning, but has not in the last 2 evenings for me.  This may reflect some amount of sundowning.  Recommendations: -Defer to neurosurgery, neuro-oncology at this time -Please feel free to reach out to neurology if additional questions or concerns arise or if final pathology is not consistent with malignancy; inpatient neurology will  be available as needed going forward -Discussed with primary team via secure chat  Brooke Dare MD-PhD [facility] [phone]  Available 7 AM to 7 PM, outside these hours please contact Neurologist on call listed on AMION

## 2022-11-01 NOTE — Progress Notes (Signed)
Subjective: No acute events overnight.   Objective: Vital signs in last 24 hours: Temp:  [97.7 F (36.5 C)-98.7 F (37.1 C)] 97.7 F (36.5 C) (05/16 0800) Pulse Rate:  [77-190] 85 (05/16 0800) Resp:  [10-19] 18 (05/16 0800) BP: (113-152)/(66-82) 152/80 (05/16 0800) SpO2:  [96 %-100 %] 98 % (05/16 0800)  Intake/Output from previous day: 05/15 0701 - 05/16 0700 In: 597.2 [I.V.:497.2; IV Piggyback:100] Out: 1680 [Urine:1680] Intake/Output this shift: No intake/output data recorded.  Alert. Speech fluent, answers simple questions Responsive to commands all 4 extremities PERRLA CN grossly intact  Dressing intact  Lab Results: Recent Labs    10/30/22 0539 11/01/22 0251  WBC 14.2* 12.9*  HGB 11.6* 9.5*  HCT 35.7* 29.2*  PLT 176 123*   BMET Recent Labs    10/30/22 2259 11/01/22 0251  NA 138 140  K 3.4* 3.0*  CL 104 106  CO2 23 25  GLUCOSE 332* 122*  BUN 20 16  CREATININE 1.14 0.83  CALCIUM 8.2* 8.4*    Studies/Results: CT HEAD WO CONTRAST ( )  Result Date: 10/31/2022 CLINICAL DATA:  Headache with neuro deficit. New right gaze preference. EXAM: CT HEAD WITHOUT CONTRAST TECHNIQUE: Contiguous axial images were obtained from the base of the skull through the vertex without intravenous contrast. RADIATION DOSE REDUCTION: This exam was performed according to the departmental dose-optimization program which includes automated exposure control, adjustment of the mA and/or kV according to patient size and/or use of iterative reconstruction technique. COMPARISON:  Earlier today FINDINGS: Brain: No evidence of acute infarction, hemorrhage, extra-axial collection or mass lesion/mass effect. Generalized ventriculomegaly suggesting communicating hydrocephalus in this patient with enhancing leptomeningeal disease. Low-density in the right temporal lobe where there is enhancing lesion by prior brain MRI and interval surgery. Stable from head Vascular: No hyperdense vessel or  unexpected calcification. Skull: Normal. Negative for fracture or focal lesion. Sinuses/Orbits: No acute finding. IMPRESSION: 1. Stable head CT since earlier today. 2. Similar degree of communicating hydrocephalus. 3. Postoperative right temporal lobe. Electronically Signed   By: Tiburcio Pea M.D.   On: 10/31/2022 21:06   CT HEAD WO CONTRAST  Result Date: 10/31/2022 CLINICAL DATA:  72 year old male with a history of rapidly progressive cognitive decline. Status post craniotomy, excision ule excision a biopsy postoperative day 1. Widespread recent abnormal leptomeningeal enhancement on MRI, enhancing right mesial temporal lobe lesion. EXAM: CT HEAD WITHOUT CONTRAST TECHNIQUE: Contiguous axial images were obtained from the base of the skull through the vertex without intravenous contrast. RADIATION DOSE REDUCTION: This exam was performed according to the departmental dose-optimization program which includes automated exposure control, adjustment of the mA and/or kV according to patient size and/or use of iterative reconstruction technique. COMPARISON:  Preoperative MRI 510 24. Preoperative head CT 10/29/2022. FINDINGS: Brain: Trace pneumocephalus along the non dependent anterior frontal lobes. Stable cerebral volume. Small anterior right temporal lobe resection cavity with gas and increased hypodensity series 3, image 13. And the resection continues laterally through the right inferior or middle temporal gyrus. Stable ventricle size and configuration. Nonspecific periventricular hypodensity continues and is confluent. No convincing acute intracranial hemorrhage. No intracranial mass effect or midline shift. No cortically based acute infarct identified. Basilar cisterns remain patent. Vascular: No suspicious intracranial vascular hyperdensity. Skull: Right temporal craniotomy with bone fasteners in place. No superimposed acute or suspicious osseous lesion. Sinuses/Orbits: Visualized paranasal sinuses and  mastoids are stable and well aerated. Other: Postoperative changes to the right scalp with skin staples in place. Orbits soft tissues are stable,  negative. IMPRESSION: 1. Satisfactory postoperative changes of right temporal craniotomy, small right anterior temporal lobe resection. Trace postoperative pneumocephalus. 2. Otherwise stable non contrast CT appearance of the brain. No new intracranial abnormality. Electronically Signed   By: Odessa Fleming M.D.   On: 10/31/2022 06:37    Assessment/Plan: Pt s/p R craniotomy for resective biopsy R temporal lobe lesion, and temporal lobe ventriculocisternostomy POD#2.    LOS: 30 days  -Continue supportive care.     Jeremy Medina Jeremy Medina 11/01/2022, 9:51 AM

## 2022-11-01 NOTE — Anesthesia Postprocedure Evaluation (Signed)
Anesthesia Post Note  Patient: Jeremy Medina.  Procedure(s) Performed: RIGHT CRANIOTOMY FOR TEMPORAL LOBE BIOPSY (Right) APPLICATION OF CRANIAL NAVIGATION (Right)     Patient location during evaluation: PACU Anesthesia Type: General Level of consciousness: awake and alert Pain management: pain level controlled Vital Signs Assessment: post-procedure vital signs reviewed and stable Respiratory status: spontaneous breathing, nonlabored ventilation, respiratory function stable and patient connected to nasal cannula oxygen Cardiovascular status: blood pressure returned to baseline and stable Postop Assessment: no apparent nausea or vomiting Anesthetic complications: no   No notable events documented.  Last Vitals:  Vitals:   11/01/22 0800 11/01/22 1155  BP: (!) 152/80 117/81  Pulse: 85 84  Resp: 18 20  Temp: 36.5 C 36.8 C  SpO2: 98% 100%    Last Pain:  Vitals:   11/01/22 1155  TempSrc: Oral  PainSc:                  Collene Schlichter

## 2022-11-01 NOTE — Progress Notes (Signed)
[facility] Progress Note Patient: Jeremy Medina. HQI:[id number] DOB: 09/29/1950 DOA: 09/28/2022  DOS: the patient was seen and examined on 11/01/2022  Brief hospital course: 72 year old M with PMH of DM-2, HTN, pancreatic insufficiency, GERD, chronic pain on methadone, PTSD and anxiety sent to ED from neurosurgery clinic for progressive subacute encephalopathy on 4/12.  Patient was recently hospitalized at the [facility] for subacute encephalopathy 3 months back and was thought to have NPH.  He was discharged home with neurology and neurosurgery follow-up.  CT head, MRI brain without contrast and MRV without acute finding.  Neurology consulted.  No improvement with IV thiamine.  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   LP on 4/17 and 4/25 didn't pinpoint etiology.  CSF studies including CJD unrevealing.  He also had CT chest, abdomen and pelvis that was negative for malignancy.  Neurosurgery indicated that patient is poor candidate for shunt placement on 4/30.  Neurology recommended tissue biopsy. Patient underwent right craniotomy for excision of temporal lobe mass and ventriculocisternostomy on 5/14. Assessment and Plan: Progressive subacute encephalopathy:  Initially concern for NPH LP showed opening pressure more than 30 cm ruling out NPH. No improvement after LP x 2. No improvement after IV thiamine dose. Treated with B12 injections, no improvement after improvement in B12 levels. Some concern about fungal meningitis at some point but felt to be less likely.  Neurology following.  Miscellaneous labs were sent for paraneoplastic leg syndrome. CJD labs were also sent.  Currently pending. Concern is that patient is suffering from destructive CNS inflammatory process in the presence of masses seen on the MRI. Neurology recommended biopsy with  neurosurgery. Underwent right craniotomy with excision of the temporal lobe mass and ventriculocisternostomy on 5/14 with Dr. Maisie Fus. Per son who is at bedside on 5/15 mentation improved significantly.  Patient able to answer questions adequately but not appropriately. Neurology ordered MRI spine for further workup -Reorientation and delirium precaution -Management per neurology and neurosurgery.   Normocytic anemia/BRBPR Acute blood loss secondary to stercoral ulcer. H&H stable for now. GI was consulted.  No indication for EGD or colonoscopy for now.   Uncontrolled IDDM-2 with hyperglycemia: With long-term insulin use at home with neuropathy A1c 9.7% on 3/28.  On 70/30 insulin at home. Continue Semglee. 30 units. Continue sliding scale insulin.   Hypokalemia/hypomagnesemia Currently being replaced.   Chronic pain syndrome Continue methadone 10 mg 3 times daily   Transaminitis Ultrasound normal.  ALT elevated.  Bilirubin normal.  Will recheck tomorrow.   Fecal impaction/constipation Fecal impaction solved. Continue bowel regimen.   Benign essential hypertension Continue amlodipine.  Lisinopril on hold. Hydralazine as needed   Folate deficiency Continue folate supplement.   Anxiety:  Stable.   Goals of care discussion.   Palliative care on board.  While the workup is being ongoing by prognosis is still poor as the patient's mentation has not improved despite multiple interventions and treatment. Concern is that the CNS tumor on the MRI could be malignant as well. Will follow neurology guidance to understand further prognosis.   Pancreatic insufficiency Continue on Creon   Obesity Body mass index is 32.27 kg/m.  Placing the patient at high risk of poor outcome.   Subjective: Patient is less interactive today.  Told the neurosurgery team orientation question but unable to follow any commands for me.  No acute events overnight.  No nausea no vomiting.  Per RN he was  able to swallow pills.  Physical Exam: General: in Mild distress, No Rash Cardiovascular: S1 and S2 Present, No Murmur Respiratory: Good respiratory effort, Bilateral Air entry present. No Crackles, No wheezes Abdomen: Bowel Sound present, No tenderness Extremities: No edema Neuro: Alert and unable to answer orientation question.  No new focal deficit.  Unable to follow any commands.    Data Reviewed: I have Reviewed nursing notes, Vitals, and Lab results. Since last encounter, pertinent lab results CBC and BMP   . I have ordered test including CBC and BMP  . I have discussed pt's care plan and test results with neurosurgery  .  Disposition: Status is: Inpatient Remains inpatient appropriate because: Need for improvement in mentation. SCDs Start: 10/30/22 1818 SCD's Start: 10/30/22 1818 SCDs Start: 10/21/22 0604 Place and maintain sequential compression device Start: 10/02/22 0808   Family Communication: No one at bedside Level of care: Progressive continue progressive care. Vitals:   10/31/22 2339 11/01/22 0256 11/01/22 0800 11/01/22 1155  BP: 135/82 (!) 149/80 (!) 152/80 117/81  Pulse: 83 85 85 84  Resp: 16 13 18 20   Temp: 97.8 F (36.6 C) 98.4 F (36.9 C) 97.7 F (36.5 C) 98.3 F (36.8 C)  TempSrc: Oral Axillary Oral Oral  SpO2: 100% 100% 98% 100%  Weight:      Height:         Author: Lynden Oxford, MD 11/01/2022 2:46 PM  Please look on [email or web address] to find out who is on call.

## 2022-11-02 ENCOUNTER — Inpatient Hospital Stay (HOSPITAL_COMMUNITY): Payer: No Typology Code available for payment source

## 2022-11-02 DIAGNOSIS — R569 Unspecified convulsions: Secondary | ICD-10-CM

## 2022-11-02 DIAGNOSIS — G030 Nonpyogenic meningitis: Secondary | ICD-10-CM | POA: Diagnosis not present

## 2022-11-02 DIAGNOSIS — R4182 Altered mental status, unspecified: Secondary | ICD-10-CM | POA: Diagnosis not present

## 2022-11-02 LAB — HEPATIC FUNCTION PANEL
ALT: 22 U/L (ref 0–44)
AST: 18 U/L (ref 15–41)
Albumin: 2.6 g/dL — ABNORMAL LOW (ref 3.5–5.0)
Alkaline Phosphatase: 77 U/L (ref 38–126)
Bilirubin, Direct: 0.2 mg/dL (ref 0.0–0.2)
Indirect Bilirubin: 0.3 mg/dL (ref 0.3–0.9)
Total Bilirubin: 0.5 mg/dL (ref 0.3–1.2)
Total Protein: 5.7 g/dL — ABNORMAL LOW (ref 6.5–8.1)

## 2022-11-02 LAB — BASIC METABOLIC PANEL
Anion gap: 10 (ref 5–15)
BUN: 14 mg/dL (ref 8–23)
CO2: 23 mmol/L (ref 22–32)
Calcium: 8.5 mg/dL — ABNORMAL LOW (ref 8.9–10.3)
Chloride: 106 mmol/L (ref 98–111)
Creatinine, Ser: 0.84 mg/dL (ref 0.61–1.24)
GFR, Estimated: >60 mL/min (ref >60)
Glucose, Bld: 182 mg/dL — ABNORMAL HIGH (ref 70–99)
Potassium: 3.1 mmol/L — ABNORMAL LOW (ref 3.5–5.1)
Sodium: 139 mmol/L (ref 135–145)

## 2022-11-02 LAB — CBC
HCT: 30.5 % — ABNORMAL LOW (ref 39.0–52.0)
Hemoglobin: 9.9 g/dL — ABNORMAL LOW (ref 13.0–17.0)
MCH: 28.9 pg (ref 26.0–34.0)
MCHC: 32.5 g/dL (ref 30.0–36.0)
MCV: 89.2 fL (ref 80.0–100.0)
Platelets: 141 10*3/uL — ABNORMAL LOW (ref 150–400)
RBC: 3.42 MIL/uL — ABNORMAL LOW (ref 4.22–5.81)
RDW: 17.2 % — ABNORMAL HIGH (ref 11.5–15.5)
WBC: 11.7 10*3/uL — ABNORMAL HIGH (ref 4.0–10.5)
nRBC: 0 % (ref 0.0–0.2)

## 2022-11-02 LAB — COMPREHENSIVE METABOLIC PANEL
ALT: 36 U/L (ref 0–44)
AST: 29 U/L (ref 15–41)
Albumin: 2.6 g/dL — ABNORMAL LOW (ref 3.5–5.0)
Alkaline Phosphatase: 84 U/L (ref 38–126)
Anion gap: 14 (ref 5–15)
BUN: 17 mg/dL (ref 8–23)
CO2: 21 mmol/L — ABNORMAL LOW (ref 22–32)
Calcium: 8.9 mg/dL (ref 8.9–10.3)
Chloride: 106 mmol/L (ref 98–111)
Creatinine, Ser: 0.95 mg/dL (ref 0.61–1.24)
GFR, Estimated: >60 mL/min (ref >60)
Glucose, Bld: 259 mg/dL — ABNORMAL HIGH (ref 70–99)
Potassium: 3.5 mmol/L (ref 3.5–5.1)
Sodium: 141 mmol/L (ref 135–145)
Total Bilirubin: 0.5 mg/dL (ref 0.3–1.2)
Total Protein: 5.9 g/dL — ABNORMAL LOW (ref 6.5–8.1)

## 2022-11-02 LAB — GLUCOSE, CAPILLARY
Glucose-Capillary: 111 mg/dL — ABNORMAL HIGH (ref 70–99)
Glucose-Capillary: 171 mg/dL — ABNORMAL HIGH (ref 70–99)
Glucose-Capillary: 220 mg/dL — ABNORMAL HIGH (ref 70–99)
Glucose-Capillary: 468 mg/dL — ABNORMAL HIGH (ref 70–99)
Glucose-Capillary: 260 mg/dL — ABNORMAL HIGH (ref 70–99)
Glucose-Capillary: 240 mg/dL — ABNORMAL HIGH (ref 70–99)
Glucose-Capillary: 158 mg/dL — ABNORMAL HIGH (ref 70–99)

## 2022-11-02 LAB — MAGNESIUM
Magnesium: 1.8 mg/dL (ref 1.7–2.4)
Magnesium: 1.8 mg/dL (ref 1.7–2.4)

## 2022-11-02 MED ORDER — FOLIC ACID 5 MG/ML IJ SOLN
1.0000 mg | Freq: Every day | INTRAMUSCULAR | Status: DC
  Administered 2022-11-03 – 2022-11-04 (×2): 1 mg via INTRAVENOUS
  Filled 2022-11-02 (×3): qty 0.2

## 2022-11-02 MED ORDER — LORAZEPAM 2 MG/ML IJ SOLN
2.0000 mg | INTRAMUSCULAR | Status: DC | PRN
  Administered 2022-11-08 – 2022-11-09 (×4): 2 mg via INTRAVENOUS
  Filled 2022-11-02 (×4): qty 1

## 2022-11-02 MED ORDER — INSULIN ASPART 100 UNIT/ML IJ SOLN
0.0000 [IU] | INTRAMUSCULAR | Status: DC
  Administered 2022-11-02: 3 [IU] via SUBCUTANEOUS
  Administered 2022-11-03 (×2): 2 [IU] via SUBCUTANEOUS
  Administered 2022-11-03: 1 [IU] via SUBCUTANEOUS
  Administered 2022-11-03: 2 [IU] via SUBCUTANEOUS
  Administered 2022-11-03: 9 [IU] via SUBCUTANEOUS
  Administered 2022-11-04: 7 [IU] via SUBCUTANEOUS
  Administered 2022-11-04 (×2): 3 [IU] via SUBCUTANEOUS
  Administered 2022-11-04: 5 [IU] via SUBCUTANEOUS
  Administered 2022-11-04 (×2): 7 [IU] via SUBCUTANEOUS
  Administered 2022-11-05 (×2): 2 [IU] via SUBCUTANEOUS
  Administered 2022-11-05: 7 [IU] via SUBCUTANEOUS
  Administered 2022-11-05: 3 [IU] via SUBCUTANEOUS

## 2022-11-02 MED ORDER — LACTATED RINGERS IV SOLN: INTRAVENOUS | Status: DC

## 2022-11-02 MED ORDER — INSULIN ASPART 100 UNIT/ML IJ SOLN: 0.0000 [IU] | Freq: Every day | INTRAMUSCULAR | Status: DC

## 2022-11-02 MED ORDER — INSULIN ASPART 100 UNIT/ML IJ SOLN: 0.0000 [IU] | Freq: Three times a day (TID) | INTRAMUSCULAR | Status: DC

## 2022-11-02 MED ORDER — LEVETIRACETAM IN NACL 1000 MG/100ML IV SOLN
1000.0000 mg | Freq: Once | INTRAVENOUS | Status: AC
  Administered 2022-11-02: 1000 mg via INTRAVENOUS
  Filled 2022-11-02: qty 100

## 2022-11-02 MED ORDER — INSULIN GLARGINE-YFGN 100 UNIT/ML ~~LOC~~ SOLN
30.0000 [IU] | Freq: Every day | SUBCUTANEOUS | Status: DC
  Administered 2022-11-02: 30 [IU] via SUBCUTANEOUS
  Filled 2022-11-02: qty 0.3

## 2022-11-02 MED ORDER — POTASSIUM CHLORIDE 20 MEQ PO PACK
60.0000 meq | PACK | Freq: Once | ORAL | Status: AC
  Administered 2022-11-02: 60 meq via ORAL
  Filled 2022-11-02: qty 3

## 2022-11-02 MED ORDER — LEVETIRACETAM IN NACL 1000 MG/100ML IV SOLN
1000.0000 mg | Freq: Two times a day (BID) | INTRAVENOUS | Status: DC
  Administered 2022-11-03 – 2022-11-07 (×9): 1000 mg via INTRAVENOUS
  Filled 2022-11-02 (×9): qty 100

## 2022-11-02 MED ORDER — LORAZEPAM 2 MG/ML IJ SOLN
INTRAMUSCULAR | Status: AC
  Filled 2022-11-02: qty 2

## 2022-11-02 MED ORDER — INSULIN ASPART 100 UNIT/ML IJ SOLN
18.0000 [IU] | Freq: Once | INTRAMUSCULAR | Status: AC
  Administered 2022-11-02: 18 [IU] via SUBCUTANEOUS

## 2022-11-02 MED ORDER — HYDRALAZINE HCL 20 MG/ML IJ SOLN: 10.0000 mg | INTRAMUSCULAR | Status: DC | PRN

## 2022-11-02 MED ORDER — LEVETIRACETAM 500 MG PO TABS: 1000.0000 mg | ORAL_TABLET | Freq: Two times a day (BID) | ORAL | Status: DC

## 2022-11-02 MED ORDER — METHADONE HCL 10 MG PO TABS: 10.0000 mg | ORAL_TABLET | Freq: Three times a day (TID) | ORAL | Status: DC

## 2022-11-02 NOTE — Code Documentation (Signed)
  Patient Name: Jeremy Medina.   MRN: [id number]   Date of Birth/ Sex: 1950-07-20 , male      Admission Date: 09/28/2022  Attending Provider: Rolly Salter, MD  Primary Diagnosis: Aseptic meningitis   Indication: CODE BLUE called around 7 PM for respiratory arrest while in CT scan.  By the time of my arrival, this person had strong peripheral pulses, was breathing spontaneously with oropharyngeal airway in place.  From report, he suffered a seizure of approximately 8 minutes duration, during which he became hypoxic and was not protecting his airway.  CODE BLUE was called for airway management but oropharyngeal airway was placed successfully prior to my arrival.  Patient was also loaded with a gram of IV Keppra.  His condition remained stable.  Primary MD and neurology were also present.  Left patient in care of primary MD as they were transporting him to the neuro ICU.    Marrianne Mood, MD  11/02/2022, 7:36 PM
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
## 2022-11-02 NOTE — Progress Notes (Signed)
eLink Physician-Brief Progress Note Patient Name: Jeremy Medina. DOB: [birth year] MRN: [id number]   Date of Service  11/02/2022  HPI/Events of Note  Patient attempting to pull off EEG wires despite mittens.  eICU Interventions  Bilateral soft wrist restraints ordered.        Migdalia Dk 11/02/2022, 9:21 PM

## 2022-11-02 NOTE — Progress Notes (Signed)
Occupational Therapy Treatment Patient Details Name: Jeremy Medina. MRN: [id number] DOB: 14-Jun-1951 Today's Date: 11/02/2022   History of present illness Jeremy Medina. is a 72 y.o. male admitted 09/28/22 with altered mental status and progressive decline.  MRI performed at [facility], concern for hydrocephalus, LP 10/03/2022.  MRI found ventriculomegaly, leptomeningeal enhancement of cerebrum, cerebellum and brainstem and some cranial nerves.  Underwent R temporal craniotomy with excision of temporal lobe mass/lesion and R ventriculocisternostomy on 10/30/22.  PMHx: type 2 diabetes, PTSD, chronic pain on methadone, hypertension, hyperlipidemia.   OT comments  Pt. Seen for skilled OT treatment session.  Able to wash face bed level min/mod a.  Self feeding of beverage with min/mod a.  Increased time and inconsistent for following one step commands. Following with eyes and engaging with facial expressions but nothing verbal during my session with him.  Agree with current d/c recommendations   Recommendations for follow up therapy are one component of a multi-disciplinary discharge planning process, led by the attending physician.  Recommendations may be updated based on patient status, additional functional criteria and insurance authorization.    Assistance Recommended at Discharge Frequent or constant Supervision/Assistance  Patient can return home with the following  Two people to help with walking and/or transfers;Two people to help with bathing/dressing/bathroom;Assistance with cooking/housework;Assistance with feeding;Direct supervision/assist for medications management;Direct supervision/assist for financial management;Assist for transportation;Help with stairs or ramp for entrance   Equipment Recommendations       Recommendations for Other Services      Precautions / Restrictions Precautions Precautions: Fall Precaution Comments: posterior lean; R ventriculosternostomy        Mobility Bed Mobility                    Transfers                         Balance                                           ADL either performed or assessed with clinical judgement   ADL Overall ADL's : Needs assistance/impaired Eating/Feeding: Moderate assistance;Bed level Eating/Feeding Details (indicate cue type and reason): initial hand over hand a for glucerna container and orientation of distance of container to mouth, once achieved was able to hold and maintain sip and pull head back when he was finished drinking Grooming: Wash/dry face;Minimal assistance;Bed level Grooming Details (indicate cue type and reason): min A to make sure patient did not agitate bandage on head                                    Extremity/Trunk Assessment              Vision       Perception     Praxis      Cognition Arousal/Alertness: Awake/alert Behavior During Therapy: WFL for tasks assessed/performed Overall Cognitive Status: Impaired/Different from baseline Area of Impairment: Orientation, Attention, Following commands, Safety/judgement, Awareness, Problem solving, Memory                 Orientation Level: Place, Time, Situation Current Attention Level: Focused Memory: Decreased short-term memory Following Commands: Follows one step commands inconsistently, Follows one step commands with increased time Safety/Judgement: Decreased awareness of  safety, Decreased awareness of deficits Awareness: Intellectual Problem Solving: Slow processing, Decreased initiation, Requires verbal cues, Requires tactile cues, Difficulty sequencing          Exercises      Shoulder Instructions       General Comments      Pertinent Vitals/ Pain       Pain Assessment Pain Assessment: No/denies pain  Home Living                                          Prior Functioning/Environment               Frequency  Min 2X/week        Progress Toward Goals  OT Goals(current goals can now be found in the care plan section)  Progress towards OT goals: Progressing toward goals     Plan Discharge plan remains appropriate    Co-evaluation                 AM-PAC OT "6 Clicks" Daily Activity     Outcome Measure   Help from another person eating meals?: Total Help from another person taking care of personal grooming?: A Little Help from another person toileting, which includes using toliet, bedpan, or urinal?: Total Help from another person bathing (including washing, rinsing, drying)?: Total Help from another person to put on and taking off regular upper body clothing?: A Lot Help from another person to put on and taking off regular lower body clothing?: Total 6 Click Score: 9    End of Session    OT Visit Diagnosis: Unsteadiness on feet (R26.81);Other abnormalities of gait and mobility (R26.89);Muscle weakness (generalized) (M62.81);Pain;Other symptoms and signs involving cognitive function;Adult, failure to thrive (R62.7)   Activity Tolerance Patient tolerated treatment well   Patient Left in bed;with call bell/phone within reach;with nursing/sitter in room   Nurse Communication Other (comment) (rn states she will reapply both mittens when she is done with meds)        Time: 1610-9604 OT Time Calculation (min): 12 min  Charges: OT General Charges $OT Visit: 1 Visit OT Treatments $Self Care/Home Management : 8-22 mins  Boneta Lucks, COTA/L Acute Rehabilitation [phone]   Alessandra Bevels Lorraine-COTA/L 11/02/2022, 1:05 PM

## 2022-11-02 NOTE — Progress Notes (Cosign Needed Addendum)
STAT LTM EEG hooked up and running - no initial skin breakdown - push button tested - [facility] monitoring.  

## 2022-11-02 NOTE — Progress Notes (Signed)
LTM EEG preliminary review: Diffuse slowing in the setting of artifact. No electrographic seizures seen.   Electronically signed: Dr. Caryl Pina

## 2022-11-02 NOTE — Progress Notes (Signed)
[facility] Progress Note Patient: Jeremy Medina. XBJ:[id number] DOB: 05-17-1951 DOA: 09/28/2022  DOS: the patient was seen and examined on 11/02/2022  Brief hospital course: 72 year old M with PMH of DM-2, HTN, pancreatic insufficiency, GERD, chronic pain on methadone, PTSD and anxiety sent to ED from neurosurgery clinic for progressive subacute encephalopathy on 4/12.  Patient was recently hospitalized at the [facility] for subacute encephalopathy 3 months back and was thought to have NPH.  He was discharged home with neurology and neurosurgery follow-up.  CT head, MRI brain without contrast and MRV without acute finding.  Neurology consulted.  No improvement with IV thiamine.  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   LP on 4/17 and 4/25 didn't pinpoint etiology.  CSF studies including CJD unrevealing.  He also had CT chest, abdomen and pelvis that was negative for malignancy.  Neurosurgery indicated that patient is poor candidate for shunt placement on 4/30.  Neurology recommended tissue biopsy. Patient underwent right craniotomy for excision of temporal lobe mass and ventriculocisternostomy on 5/14. Assessment and Plan: Progressive subacute encephalopathy:  Initially concern for NPH LP showed opening pressure more than 30 cm ruling out NPH. No improvement after LP x 2. No improvement after IV thiamine dose. Treated with B12 injections, no improvement after improvement in B12 levels. Some concern about fungal meningitis at some point but felt to be less likely.  Neurology following.  Miscellaneous labs were sent for paraneoplastic leg syndrome. CJD labs were also sent.  Currently pending. Concern is that patient is suffering from destructive CNS inflammatory process in the presence of masses seen on the MRI. Neurology recommended biopsy with  neurosurgery. Underwent right craniotomy with excision of the temporal lobe mass and ventriculocisternostomy on 5/14 with Dr. Maisie Fus. Per son who is at bedside on 5/15 mentation improved significantly.  Patient able to answer questions adequately but not appropriately. Ordered MRI spine for further workup -Reorientation and delirium precaution Neurology currently signed off.  Neurosurgery following peripherally.  Preliminary biopsy concerning for possible malignancy.  Will await for confirmation before further discussion with the family regarding management and goals of care. Neurology has already informed neurooncology about pending biopsy.   Normocytic anemia/BRBPR Acute blood loss secondary to stercoral ulcer. H&H stable for now. GI was consulted.  No indication for EGD or colonoscopy for now.   Uncontrolled IDDM-2 with hyperglycemia: With long-term insulin use at home with neuropathy A1c 9.7% on 3/28.  On 70/30 insulin at home. Intermittently hyper and hypoglycemic. Now hyperglycemic again on 5/17. Resuming Semglee.  Increasing sliding scale.  Changing from every 4 to ACHS.   Hypokalemia/hypomagnesemia Replaced.   Chronic pain syndrome Continue methadone 10 mg 3 times daily   Transaminitis ALT was elevated.  Now normal.  Ultrasound liver normal.   Fecal impaction/constipation Fecal impaction solved. Continue bowel regimen.   Benign essential hypertension Continue amlodipine.  Lisinopril on hold. Hydralazine as needed   Folate deficiency Continue folate supplement.   Anxiety:  Stable.   Goals of care discussion.   Palliative care on board.  While the workup is being ongoing by prognosis is still poor as the patient's mentation has not improved despite multiple interventions and treatment. Concern is that the CNS tumor on the MRI could be malignant as well.   Pancreatic insufficiency Continue on Creon   Obesity Body mass index is 32.27 kg/m.  Placing the patient  at high risk of poor outcome.  Subjective: Alert and awake but unable to follow any commands.  No nausea no vomiting.  No events overnight.  Able to take pills as well as able to eat with assistance.  Physical Exam: In no distress.  No rash. S1-S2 present.  No murmur. Clear to auscultation. Bowel sound present.  No tenderness. No edema. No focal deficit.  Unable to follow any commands.  Speech incoherent.  Able to tell me his last name.  Data Reviewed: I have Reviewed nursing notes, Vitals, and Lab results. Reviewed CBC and BMP.  Reordered CBC and BMP.  Disposition: Status is: Inpatient Remains inpatient appropriate because: Need for improvement in mentation. SCDs Start: 10/30/22 1818 SCDs Start: 10/21/22 0604 Place and maintain sequential compression device Start: 10/02/22 0808   Family Communication: No one at bedside Level of care: Progressive continue progressive care. Vitals:   11/02/22 0821 11/02/22 0822 11/02/22 1145 11/02/22 1538  BP: (!) 154/86  137/89 116/65  Pulse: 88  87 94  Resp: 18  19 (!) 25  Temp: 98.2 F (36.8 C)  (!) 97.5 F (36.4 C) 98.4 F (36.9 C)  TempSrc: Oral Oral Axillary Oral  SpO2: 100%  98% 99%  Weight:      Height:         Author: Lynden Oxford, MD 11/02/2022 5:10 PM  Please look on [email or web address] to find out who is on call.

## 2022-11-02 NOTE — Progress Notes (Signed)
Neurology Progress Note  Received a call from rapid response RN that Mr. Lindner had an 8 minute seizure and was taken for a stat head CT. On our arrival to CT he was withdrawing to noxious stimuli bilaterally, gaze deviated to the right, no blink to threat, no response to verbal stimuli, snorous respirations with sPO2 at 100%. He clenched his jaw and became apneic. Jaw thrust was initially unsuccessful and code blue called.  An oral airway placed and was bagged via BVM with O2 sats maintaining 100%.  Keppra 1000mg  bolus given. Increase keppra to 1000mg  BID. Plan to transfer to ICU and start continuous EEG monitoring. PCCM updated.  Neurology MD, Hospitalist, RR RN, Neurology NP at bedside for entire event.   Elmer Picker, DNP, FNP-BC [facility] Pager: [phone]   This patient had a breakthrough seizure on the floor, while we were attending to the patient and CT he had a brief episode where he was not protecting his airway and not breathing well, he was bagged and oral airway was introduced with resumption of reading.  He did have a period of time where his left pupil was slightly larger and severely sluggish, but then this improved to a bilateral reactive pupils.  This patient is critically ill and at significant risk of neurological worsening, death and care requires constant monitoring of vital signs, hemodynamics,respiratory and cardiac monitoring, neurological assessment, discussion with family, other specialists and medical decision making of high complexity. I spent 34 minutes of neurocritical care time  in the care of  this patient. This was time spent independent of any time provided by nurse practitioner or PA.  Ritta Slot, MD [facility] [phone]  If 7pm- 7am, please page neurology on call as listed in AMION. 11/02/2022  8:53 PM

## 2022-11-02 NOTE — Significant Event (Signed)
Rapid Response Event Note   Reason for Call :  Seizure  Initial Focused Assessment:  Pt witnessed to have a seizure lasting 8 minutes. He was noted to have right gaze, bilateral upper extremity shaking, no response to painful stimuli, no response to visual threat. Minimal bleeding from mouth noted.   VS: BP 142/92, HR 112, RR 28, SpO2 100% on room air CBG: 260  Pt seizure activity stopped spontaneously and pt noted to have snorous respiration. SpO2 maintaining 100%. Pt taken for STAT CT head. Upon completion of CT scan, pt noted to be breathing heavier, still maintaining SpO2 100% on room air. Pt then clenched jaw and became apneic. Initial attempt to chin-thrust was unsuccessful, code blue called.   Oral airway was able to be inserted. Pt bagged via BVM to maintain oxygen saturation of 100%. Pt was able to maintain airway on 100% NRB.   Interventions:  -STAT CT head  -Oral airway -1000mg  IV Keppra  Plan of Care:  -Transfer to ICU -LTM EEG  Event Summary:  MD Notified: Dr Allena Katz Call Time: 1826 Arrival Time: 1830 End Time: 1930  Jennye Moccasin, RN

## 2022-11-02 NOTE — Progress Notes (Signed)
eLink Physician-Brief Progress Note Patient Name: Jeremy Medina. DOB: 02-11-1951 MRN: [id number]   Date of Service  11/02/2022  HPI/Events of Note  Patient transferred to the ICU after having a witnessed seizure on the regular floor.  eICU Interventions  New Patient Evaluation.        Migdalia Dk 11/02/2022, 7:43 PM

## 2022-11-02 NOTE — Progress Notes (Signed)
NT was rounding in on pt and called RN to the room for assistance, As RN walked in, pt was assessed to be staring to the right with a fixed gaze and eyes not retracting to the left when assessed. Pt had some shaking/tremors to both hands, snoring, incontinent with urine and stool. Pt not verbally responsive or following commands to voice but raised hands to chest to grab RN hand when RN was sternal rubbing him. While assessing pt, pt initially would blink eye lids and had some grip but later stopped blinking or responding to pain. Pt CBG checked to be 260, Rapid Response called to the bedside, MD Allena Katz notified and CT stat order received. Pt witnessed to have possible seizures more than and now post ictal, snoring. Pt transported off unit to CT via bed with Rapid response RN along with 2 other floor nurses. VSS, telemetry on NSR. Dionne Bucy MSN RN.   11/02/22 1825  Vitals  Temp 99.3 F (37.4 C)  Temp Source Oral  BP (!) 142/92  MAP (mmHg) 107  BP Location Right Arm  BP Method Automatic  Patient Position (if appropriate) Lying  Pulse Rate (!) 104  Pulse Rate Source Monitor  ECG Heart Rate (!) 104  Resp (!) 30  MEWS COLOR  MEWS Score Color Yellow  Oxygen Therapy  SpO2 99 %  O2 Device Room Air  MEWS Score  MEWS Temp 0  MEWS Systolic 0  MEWS Pulse 1  MEWS RR 2  MEWS LOC 0  MEWS Score 3

## 2022-11-02 NOTE — Progress Notes (Signed)
Pt CBG at 1545 resulted as 468. Physician contacted. Insulin adjusted and long acting added.  18 units of insulin aspart given  30 units of semglee given and added daily

## 2022-11-02 NOTE — Progress Notes (Signed)
[facility] PROGRESS NOTE  Patient: Jeremy Medina. EAV:[id number]   PCP: [facility] DOB: 10/25/1950   DOA: 09/28/2022   DOS: 11/02/2022    Subjective: I was called at the bedside to evaluate the patient.  When NT was rounding on the patient, RN was called and in to check on the patient.  she found that the patient was having right gaze preference not returning to the left when assessed.  Shaking tremors in both hands.  Incontinent with urine and stool.  Verbally not responsive.  Rapid response was called.  Seizure activity stopped spontaneously therefore did not require any IV Ativan or benzo. VS: BP 142/92, HR 112, RR 28, SpO2 100% on room air CBG: 260  Stat CT scan of the head was ordered.  Patient was postictal by the time I evaluated the patient.  Neurology was in the CT room at the bedside. In the CT room patient become apneic, responding to jaw thrust maneuver.  CODE BLUE was called as the patient was clamping down. An oral airway was inserted and BVM was initiated with improvement in oxygenation.  Patient was 100% on NRB. CODE BLUE was canceled. Anesthesia was called in case patient required intubation but that call was also canceled. Patient was given 1000 mg IV Keppra load. LTM EEG requested. Transferred to the ICU. Discussed with ICU.  Goals of care conversation. Discussed in detail with regards to patient's son on the phone. Explained the possibility of glioma as in the differential based on the preliminary report available which can be pretty aggressive. Currently son wants to continue full code.  Would like to go ahead with intubation and CPR if needed. Son currently awaiting results of the biopsy but has indicated that would like to go ahead with any available treatment options possible for the condition. Objective:  Vitals:   11/02/22 1538 11/02/22 1825 11/02/22 1827 11/02/22 1908  BP: 116/65 (!) 142/92 (!) 142/92 (!) 152/75  Pulse: 94 (!) 104 (!)  112 (!) 108  Resp: (!) 25 (!) 30 (!) 28 (!) 28  Temp: 98.4 F (36.9 C) 99.3 F (37.4 C)    TempSrc: Oral Oral    SpO2: 99% 99% 100% 100%  Weight:      Height:      Clear to auscultation. S1-S2 present. Sinus rhythm on telemetry. Unable to follow any commands. Right gaze preference. Right pupil not reactive at the time of my evaluation and left pupil was reactive.  Initially left pupil was not reactive. Withdraws to painful stimuli. Had a good cough right before transfer to the ICU.  But initially had no gag reflex.  Assessment and plan: Transferred to the ICU. Currently postictal. ICU team notified. Neurology ordered overnight EEG. High risk for intubation. Received IV Keppra 1 g load.  Receiving IV Keppra 1 g twice daily. Appreciate PCCM service assistance. [facility] will be happy to take over the patient once medically stable to come out of the ICU.  Or if the goal of care is changed.  Author: Lynden Oxford, MD [facility] 11/02/2022 7:32 PM   If 7PM-7AM, please contact night-coverage at [email or web address]

## 2022-11-03 DIAGNOSIS — G40919 Epilepsy, unspecified, intractable, without status epilepticus: Secondary | ICD-10-CM | POA: Diagnosis not present

## 2022-11-03 DIAGNOSIS — R569 Unspecified convulsions: Secondary | ICD-10-CM | POA: Diagnosis not present

## 2022-11-03 DIAGNOSIS — R4182 Altered mental status, unspecified: Secondary | ICD-10-CM | POA: Diagnosis not present

## 2022-11-03 DIAGNOSIS — G030 Nonpyogenic meningitis: Secondary | ICD-10-CM | POA: Diagnosis not present

## 2022-11-03 LAB — BASIC METABOLIC PANEL
Anion gap: 10 (ref 5–15)
BUN: 16 mg/dL (ref 8–23)
CO2: 25 mmol/L (ref 22–32)
Calcium: 9 mg/dL (ref 8.9–10.3)
Chloride: 106 mmol/L (ref 98–111)
Creatinine, Ser: 0.79 mg/dL (ref 0.61–1.24)
GFR, Estimated: >60 mL/min (ref >60)
Glucose, Bld: 54 mg/dL — ABNORMAL LOW (ref 70–99)
Potassium: 3.6 mmol/L (ref 3.5–5.1)
Sodium: 141 mmol/L (ref 135–145)

## 2022-11-03 LAB — GLUCOSE, CAPILLARY
Glucose-Capillary: 48 mg/dL — ABNORMAL LOW (ref 70–99)
Glucose-Capillary: 192 mg/dL — ABNORMAL HIGH (ref 70–99)
Glucose-Capillary: 151 mg/dL — ABNORMAL HIGH (ref 70–99)
Glucose-Capillary: 144 mg/dL — ABNORMAL HIGH (ref 70–99)
Glucose-Capillary: 188 mg/dL — ABNORMAL HIGH (ref 70–99)
Glucose-Capillary: 379 mg/dL — ABNORMAL HIGH (ref 70–99)

## 2022-11-03 LAB — CBC
HCT: 30.3 % — ABNORMAL LOW (ref 39.0–52.0)
Hemoglobin: 10 g/dL — ABNORMAL LOW (ref 13.0–17.0)
MCH: 29.1 pg (ref 26.0–34.0)
MCHC: 33 g/dL (ref 30.0–36.0)
MCV: 88.1 fL (ref 80.0–100.0)
Platelets: 155 10*3/uL (ref 150–400)
RBC: 3.44 MIL/uL — ABNORMAL LOW (ref 4.22–5.81)
RDW: 16.9 % — ABNORMAL HIGH (ref 11.5–15.5)
WBC: 13 10*3/uL — ABNORMAL HIGH (ref 4.0–10.5)
nRBC: 0 % (ref 0.0–0.2)

## 2022-11-03 LAB — MAGNESIUM: Magnesium: 1.9 mg/dL (ref 1.7–2.4)

## 2022-11-03 LAB — FUNGUS CULTURE WITH STAIN

## 2022-11-03 MED ORDER — DEXTROSE IN LACTATED RINGERS 5 % IV SOLN: INTRAVENOUS | Status: DC

## 2022-11-03 MED ORDER — METHADONE HCL 10 MG PO TABS
10.0000 mg | ORAL_TABLET | Freq: Three times a day (TID) | ORAL | Status: DC
  Administered 2022-11-03 – 2022-11-11 (×21): 10 mg via ORAL
  Filled 2022-11-03 (×23): qty 1

## 2022-11-03 MED ORDER — DEXTROSE 50 % IV SOLN
INTRAVENOUS | Status: AC
  Administered 2022-11-03: 50 mL
  Filled 2022-11-03: qty 50

## 2022-11-03 NOTE — Progress Notes (Signed)
[facility] PROGRESS NOTE  Patient: Jeremy Medina. ZOX:[id number]   PCP: [facility] DOB: 05/09/1951   DOA: 09/28/2022   DOS: 11/03/2022    Assessment and plan: currently under ICU. Mild hypoglycemia noted.  Will add D5 LR. Will likely be transferred out of the ICU on 5/19.  Will take over after 7 AM unless no issues overnight.  Author: Lynden Oxford, MD [facility] 11/03/2022 4:23 PM   If 7PM-7AM, please contact night-coverage at [email or web address]

## 2022-11-03 NOTE — Plan of Care (Signed)
  Problem: Fluid Volume: Goal: Ability to maintain a balanced intake and output will improve Outcome: Progressing   Problem: Metabolic: Goal: Ability to maintain appropriate glucose levels will improve Outcome: Progressing   Problem: Nutritional: Goal: Progress toward achieving an optimal weight will improve Outcome: Progressing   Problem: Tissue Perfusion: Goal: Adequacy of tissue perfusion will improve Outcome: Progressing   Problem: Clinical Measurements: Goal: Will remain free from infection Outcome: Progressing Goal: Cardiovascular complication will be avoided Outcome: Progressing   Problem: Activity: Goal: Risk for activity intolerance will decrease Outcome: Progressing   Problem: Coping: Goal: Level of anxiety will decrease Outcome: Progressing   Problem: Elimination: Goal: Will not experience complications related to bowel motility Outcome: Progressing Goal: Will not experience complications related to urinary retention Outcome: Progressing   Problem: Pain Managment: Goal: General experience of comfort will improve Outcome: Progressing   Problem: Safety: Goal: Ability to remain free from injury will improve Outcome: Progressing   Problem: Clinical Measurements: Goal: Ability to maintain intracranial pressure will improve Outcome: Progressing   Problem: Skin Integrity: Goal: Demonstration of wound healing without infection will improve Outcome: Progressing   Problem: Education: Goal: Ability to describe self-care measures that may prevent or decrease complications (Diabetes Survival Skills Education) will improve Outcome: Not Progressing Goal: Individualized Educational Video(s) Outcome: Not Progressing   Problem: Coping: Goal: Ability to adjust to condition or change in health will improve Outcome: Not Progressing   Problem: Health Behavior/Discharge Planning: Goal: Ability to identify and utilize available resources and services will  improve Outcome: Not Progressing Goal: Ability to manage health-related needs will improve Outcome: Not Progressing   Problem: Nutritional: Goal: Maintenance of adequate nutrition will improve Outcome: Not Progressing   Problem: Skin Integrity: Goal: Risk for impaired skin integrity will decrease Outcome: Not Progressing   Problem: Education: Goal: Knowledge of General Education information will improve Description: Including pain rating scale, medication(s)/side effects and non-pharmacologic comfort measures Outcome: Not Progressing   Problem: Health Behavior/Discharge Planning: Goal: Ability to manage health-related needs will improve Outcome: Not Progressing   Problem: Clinical Measurements: Goal: Ability to maintain clinical measurements within normal limits will improve Outcome: Not Progressing Goal: Respiratory complications will improve Outcome: Not Progressing   Problem: Nutrition: Goal: Adequate nutrition will be maintained Outcome: Not Progressing   Problem: Skin Integrity: Goal: Risk for impaired skin integrity will decrease Outcome: Not Progressing   Problem: Education: Goal: Knowledge of the prescribed therapeutic regimen will improve Outcome: Not Progressing   Problem: Clinical Measurements: Goal: Usual level of consciousness will be regained or maintained. Outcome: Not Progressing Goal: Neurologic status will improve Outcome: Not Progressing   Problem: Safety: Goal: Non-violent Restraint(s) Outcome: Not Progressing

## 2022-11-03 NOTE — Progress Notes (Signed)
eLink Physician-Brief Progress Note Patient Name: Jeremy Medina. DOB: Feb 25, 1951 MRN: [id number]   Date of Service  11/03/2022  HPI/Events of Note  Request by family to restart home methadone  Camera check, patient more awake and alert Per family patient previously on methadone 20-40 mg TID with oversedation  eICU Interventions  Start methadone 10 mg TID. Day team to titrate as appropriate     Intervention Category Minor Interventions: Communication with other healthcare providers and/or family  [name] Mechele Collin 11/03/2022, 8:37 PM
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
## 2022-11-03 NOTE — Procedures (Signed)
Patient Name: Jeremy Medina.  MRN: [id number]  Epilepsy Attending: Charlsie Quest  Referring Physician/Provider: Elmer Picker, NP  Duration: 11/02/2022 2005 to 11/03/2022 2005  Patient history: 72yo M had an 8 minute seizure getting eeg to evaluate for seizure.  Level of alertness: Awake, asleep  AEDs during EEG study: LEV  Technical aspects: This EEG study was done with scalp electrodes positioned according to the 10-20 International system of electrode placement. F8 not used due to bandage. Electrical activity was reviewed with band pass filter of 1-70Hz , sensitivity of 7 uV/mm, display speed of 11mm/sec with a 60Hz  notched filter applied as appropriate. EEG data were recorded continuously and digitally stored.  Video monitoring was available and reviewed as appropriate.  Description: No clear posterior dominant rhythm was seen. EEG showed continuous generalized 3 to 6 Hz theta-delta slowing. Sleep was characterized by sleep spindles (12-14z) maximal fronto-central region. Generalized periodic discharges with triphasic morphology at 1 Hz were noted, more prominent when awake/stimulated.  Hyperventilation and photic stimulation were not performed.     Event button was pressed on 11/03/2022 at 0913 for right hand twitching, Initially camera was blocked by care team but when visible, patient was noted to be slowly moving right hand, no definite rhythmic activity was noted.. Concomitant eeg before, during and after the event didn't show any eeg change to suggest seizure.    ABNORMALITY - Periodic discharges with triphasic morphology, generalized - Continuous slow, generalized   IMPRESSION: This study is suggestive of moderate diffuse encephalopathy, nonspecific etiology but likely related to toxic-metabolic causes. No seizures or definite epileptiform discharges were seen throughout the recording.  One event was recorded on 11/03/2022 at 0913 as described above without concomitant eeg  change. This was most likely NOT an epileptic event.    [name]

## 2022-11-03 NOTE — Progress Notes (Signed)
Subjective: He has become more interactive again.  Exam: Vitals:   11/03/22 1700 11/03/22 1800  BP: 128/78 136/77  Pulse: 88 73  Resp: 18 (!) 21  Temp:    SpO2: 100% 100%   Gen: In bed, NAD Resp: non-labored breathing, no acute distress Abd: soft, nt  Neuro: MS: Awake, engages with examiner, does not answer most questions, but does follow commands to stick out tongue, squeeze my fingers. CN: Visual fields full, tracks across midline in both directions, face symmetric Motor: Moves all extremities relatively symmetrically Sensory: Responds to mild stimulation bilaterally   Pertinent Labs: Creatinine 1.0  Impression: 72 year old male who presented with progressive encephalopathy who has undergone extensive workup for infectious, autoimmune, malignant etiologies, to LPs with increased protein and WBC.  Pathology still pending, but suspicion for neoplastic disease is now very high.  He had a breakthrough seizure last night, but appears to be improving and has had no further seizures on EEG.  I would favor continuing EEG overnight again tonight, but if negative, we can discontinue it first thing in the morning.  Recommendations: 1) continue Keppra at increased dose of 1 g twice daily 2) LTM EEG overnight tonight, can discontinue if no seizures tomorrow. 3) neurology will follow  Ritta Slot, MD [facility] [phone]  If 7pm- 7am, please page neurology on call as listed in AMION.

## 2022-11-03 NOTE — Progress Notes (Signed)
Hypoglycemic Event  CBG: 48  Treatment: D50 50 mL (25 gm)  Symptoms: None  Follow-up CBG: Time:0349 CBG Result:192  Possible Reasons for Event: Unknown  Comments/MD notified: 0354, E-link RN to notify Ogan MD     Barnett Hatter

## 2022-11-04 DIAGNOSIS — G030 Nonpyogenic meningitis: Secondary | ICD-10-CM | POA: Diagnosis not present

## 2022-11-04 LAB — GLUCOSE, CAPILLARY
Glucose-Capillary: 141 mg/dL — ABNORMAL HIGH (ref 70–99)
Glucose-Capillary: 219 mg/dL — ABNORMAL HIGH (ref 70–99)
Glucose-Capillary: 239 mg/dL — ABNORMAL HIGH (ref 70–99)
Glucose-Capillary: 263 mg/dL — ABNORMAL HIGH (ref 70–99)
Glucose-Capillary: 311 mg/dL — ABNORMAL HIGH (ref 70–99)
Glucose-Capillary: 316 mg/dL — ABNORMAL HIGH (ref 70–99)
Glucose-Capillary: 316 mg/dL — ABNORMAL HIGH (ref 70–99)

## 2022-11-04 LAB — BASIC METABOLIC PANEL
Anion gap: 13 (ref 5–15)
Anion gap: 8 (ref 5–15)
BUN: 15 mg/dL (ref 8–23)
BUN: 15 mg/dL (ref 8–23)
CO2: 26 mmol/L (ref 22–32)
CO2: 28 mmol/L (ref 22–32)
Calcium: 8.8 mg/dL — ABNORMAL LOW (ref 8.9–10.3)
Calcium: 9.1 mg/dL (ref 8.9–10.3)
Chloride: 103 mmol/L (ref 98–111)
Chloride: 105 mmol/L (ref 98–111)
Creatinine, Ser: 0.78 mg/dL (ref 0.61–1.24)
Creatinine, Ser: 0.84 mg/dL (ref 0.61–1.24)
GFR, Estimated: >60 mL/min (ref >60)
GFR, Estimated: >60 mL/min (ref >60)
Glucose, Bld: 144 mg/dL — ABNORMAL HIGH (ref 70–99)
Glucose, Bld: 269 mg/dL — ABNORMAL HIGH (ref 70–99)
Potassium: 2.9 mmol/L — ABNORMAL LOW (ref 3.5–5.1)
Potassium: 4.1 mmol/L (ref 3.5–5.1)
Sodium: 142 mmol/L (ref 135–145)
Sodium: 141 mmol/L (ref 135–145)

## 2022-11-04 LAB — CBC
HCT: 28.7 % — ABNORMAL LOW (ref 39.0–52.0)
Hemoglobin: 9.4 g/dL — ABNORMAL LOW (ref 13.0–17.0)
MCH: 28.9 pg (ref 26.0–34.0)
MCHC: 32.8 g/dL (ref 30.0–36.0)
MCV: 88.3 fL (ref 80.0–100.0)
Platelets: 197 10*3/uL (ref 150–400)
RBC: 3.25 MIL/uL — ABNORMAL LOW (ref 4.22–5.81)
RDW: 17.2 % — ABNORMAL HIGH (ref 11.5–15.5)
WBC: 9.1 10*3/uL (ref 4.0–10.5)
nRBC: 0 % (ref 0.0–0.2)

## 2022-11-04 LAB — MAGNESIUM: Magnesium: 1.9 mg/dL (ref 1.7–2.4)

## 2022-11-04 MED ORDER — POTASSIUM CHLORIDE 10 MEQ/100ML IV SOLN
10.0000 meq | INTRAVENOUS | Status: AC
  Administered 2022-11-04 (×4): 10 meq via INTRAVENOUS
  Filled 2022-11-04 (×4): qty 100

## 2022-11-04 MED ORDER — FOLIC ACID 1 MG PO TABS
1.0000 mg | ORAL_TABLET | Freq: Every day | ORAL | Status: DC
  Administered 2022-11-05 – 2022-11-06 (×2): 1 mg via ORAL
  Filled 2022-11-04 (×3): qty 1

## 2022-11-04 MED ORDER — ORAL CARE MOUTH RINSE: 15.0000 mL | OROMUCOSAL | Status: DC | PRN

## 2022-11-04 MED ORDER — POTASSIUM CHLORIDE CRYS ER 20 MEQ PO TBCR
40.0000 meq | EXTENDED_RELEASE_TABLET | Freq: Once | ORAL | Status: AC
  Administered 2022-11-04: 40 meq via ORAL
  Filled 2022-11-04: qty 2

## 2022-11-04 NOTE — Progress Notes (Signed)
SLP Cancellation Note  Patient Details Name: Jeremy Medina. MRN: [id number] DOB: [birth year]   Cancelled treatment:       Reason Eval/Treat Not Completed: Patient's level of consciousness. Patient would open eyes briefly but not adequately alert for PO's at this time. Per RN, patient has been consuming about 50% of meal trays with encouragement, cues and feeding assistance. SLP recommending to continue on current diet of Dys 2(minced), thin liquids and SLP will f/u next date.   Angela Nevin, MA, CCC-SLP Speech Therapy

## 2022-11-04 NOTE — Progress Notes (Signed)
Subjective: He is somewhat interactive  Exam: Vitals:   11/04/22 1200 11/04/22 1325  BP: (!) 140/123 (!) 149/93  Pulse: 92 92  Resp: 19 17  Temp: (!) 97.5 F (36.4 C)   SpO2: 100% 100%   Gen: In bed, NAD Resp: non-labored breathing, no acute distress Abd: soft, nt  Neuro: MS: Awake, engages with examiner, does not answer most questions, but does follow commands to stick out tongue, squeeze my fingers. CN: Visual fields full, tracks across midline in both directions, face symmetric Motor: Moves all extremities relatively symmetrically Sensory: Responds to mild stimulation bilaterally   Pertinent Labs: Creatinine 1.0  Impression: 72 year old male who presented with progressive encephalopathy who has undergone extensive workup for infectious, autoimmune, malignant etiologies, to LPs with increased protein and WBC.  Pathology still pending, but suspicion for neoplastic disease is now very high.  He had a breakthrough seizure last night, but appears to be improving and has had no further seizures on EEG.  I would favor continuing EEG overnight again tonight, but if negative, we can discontinue it first thing in the morning.  Recommendations: 1) continue Keppra at increased dose of 1 g twice daily 2) can discontinue EEG 3) follow-up pathology 4) please call if he has any other seizures or neurology can be of any further assistance.  Ritta Slot, MD [facility] [phone]  If 7pm- 7am, please page neurology on call as listed in AMION.

## 2022-11-04 NOTE — Procedures (Signed)
Patient Name: Jeremy Medina.  MRN: [id number]  Epilepsy Attending: Charlsie Quest  Referring Physician/Provider: Elmer Picker, NP  Duration: 11/03/2022 2005 to 11/04/2022 0846   Patient history: 72yo M had an 8 minute seizure getting eeg to evaluate for seizure.   Level of alertness: Awake, asleep   AEDs during EEG study: LEV   Technical aspects: This EEG study was done with scalp electrodes positioned according to the 10-20 International system of electrode placement. F8 not used due to bandage. Electrical activity was reviewed with band pass filter of 1-70Hz , sensitivity of 7 uV/mm, display speed of 74mm/sec with a 60Hz  notched filter applied as appropriate. EEG data were recorded continuously and digitally stored.  Video monitoring was available and reviewed as appropriate.   Description: The posterior dominant rhythm consists of 7.5Hz  activity of moderate voltage (25-35 uV) seen predominantly in posterior head regions, symmetric and reactive to eye opening and eye closing. Sleep was characterized by sleep spindles (12-14z) maximal fronto-central region. EEG showed continuous generalized 3 to 6 Hz theta-delta slowing, at times with triphasic morphology. Hyperventilation and photic stimulation were not performed.      ABNORMALITY - Continuous slow, generalized   IMPRESSION: This study is suggestive of mild to moderate diffuse encephalopathy, nonspecific etiology but likely related to toxic-metabolic causes. No seizures or definite epileptiform discharges were seen throughout the recording.   Sofija Antwi Annabelle Harman

## 2022-11-04 NOTE — Progress Notes (Signed)
LTM EEG discontinued - no skin breakdown at unhook. [facility] notified 

## 2022-11-04 NOTE — Progress Notes (Signed)
[facility] Progress Note Patient: Jeremy Medina. UJW:[id number] DOB: 70-Oct-1952 DOA: 09/28/2022  DOS: the patient was seen and examined on 11/04/2022  Brief hospital course: 72 year old M with PMH of DM-2, HTN, pancreatic insufficiency, GERD, chronic pain on methadone, PTSD and anxiety sent to ED from neurosurgery clinic for progressive subacute encephalopathy on 4/12.  Patient was recently hospitalized at the [facility] for subacute encephalopathy 3 months back and was thought to have NPH.  He was discharged home with neurology and neurosurgery follow-up.  CT head, MRI brain without contrast and MRV without acute finding.  Neurology consulted.  No improvement with IV thiamine.  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   LP on 4/17 and 4/25 didn't pinpoint etiology.  CSF studies including CJD unrevealing.  He also had CT chest, abdomen and pelvis that was negative for malignancy.  Neurosurgery indicated that patient is poor candidate for shunt placement on 4/30.  Neurology recommended tissue biopsy. Patient underwent right craniotomy for excision of temporal lobe mass and ventriculocisternostomy on 5/14. Developed significant seizures with persistent postictal encephalopathy requiring transfer to the ICU on 5/17.  Transferred out on 5/19.  On a higher dose of Keppra.  LTM EEG performed. Assessment and Plan: Progressive subacute encephalopathy Initially concern for NPH LP showed opening pressure more than 30 cm ruling out NPH. No improvement after LP x 2. No improvement after IV thiamine dose. Treated with B12 injections, no improvement after improvement in B12 levels. Some concern about fungal meningitis at some point but felt to be less likely.  Neurology following.  Miscellaneous labs were sent for paraneoplastic leg syndrome. CJD labs were also  sent.  Currently negative. Concern is that patient is suffering from destructive CNS inflammatory process in the presence of masses seen on the MRI. Neurology recommended biopsy with neurosurgery. Underwent right craniotomy with excision of the temporal lobe mass and ventriculocisternostomy on 5/14 with Dr. Maisie Fus. Neurology currently signed off.  Neurosurgery following peripherally.   Preliminary biopsy concerning for possible malignancy.   Neurology has already informed neurooncology about pending biopsy.  Breakthrough seizures Developed on 5/17.  Despite being on Keppra 500 mg twice daily. Underwent LTM EEG without any active seizures after increasing the dose of the Keppra. Mentation improving somewhat.  LTM EEG discontinued on 5/19.   Normocytic anemia/BRBPR Acute blood loss secondary to stercoral ulcer. H&H stable for now. GI was consulted.  No indication for EGD or colonoscopy for now.   Uncontrolled IDDM-2 with hyperglycemia With long-term insulin use at home with neuropathy A1c 9.7% on 3/28.  On 70/30 insulin at home. Intermittently hyper and hypoglycemic. Now hyperglycemic again on 5/17. Resuming Semglee.  Increasing sliding scale.  Changing from every 4 to ACHS.   Hypokalemia/hypomagnesemia Replaced.   Chronic pain syndrome Continue methadone 10 mg 3 times daily   Transaminitis ALT was elevated.  Now normal.  Ultrasound liver normal.   Fecal impaction/constipation Fecal impaction solved. Continue bowel regimen.   Benign essential hypertension Continue amlodipine.  Lisinopril on hold. Hydralazine as needed   Folate deficiency Continue folate supplement.   Anxiety:  Stable.   Goals of care discussion.   Palliative care on board.   While the workup is being ongoing by prognosis is still poor as the patient's mentation has not improved despite multiple interventions and treatment. Concern is that the CNS tumor on the MRI could be malignant as well.    Pancreatic insufficiency  Continue on Creon   Obesity Body mass index is 32.27 kg/m.  Placing the patient at high risk of poor outcome.   Subjective: Alert.  Not able to follow any commands.  Able to verbalize but no coherent conversation.  Physical Exam: In no distress. No rash. S1-S2 present. Clear to auscultation. Bowel sound present. Alert but unable to follow any commands.  Speech clear and coherent.  Data Reviewed: I have Reviewed nursing notes, Vitals, and Lab results. Since last encounter, pertinent lab results CBC and BMP   . I have ordered test including CBC and BMP  .  Discussed with PCCM as neurology  Disposition: Status is: Inpatient Remains inpatient appropriate because: Need for improvement in mentation. SCDs Start: 10/30/22 1818 Place and maintain sequential compression device Start: 10/02/22 0808   Family Communication: No one at bedside Level of care: Progressive continue progressive care. Vitals:   11/04/22 1000 11/04/22 1100 11/04/22 1200 11/04/22 1325  BP: 133/84 135/87 (!) 140/123 (!) 149/93  Pulse: 98 95 92 92  Resp: (!) 28 20 19 17   Temp:      TempSrc:      SpO2: 100% 100% 100% 100%  Weight:      Height:         Author: Lynden Oxford, MD 11/04/2022 2:35 PM  Please look on [email or web address] to find out who is on call.

## 2022-11-04 NOTE — Progress Notes (Signed)
[facility] ADULT ICU REPLACEMENT PROTOCOL   The patient does apply for the [facility] Adult ICU Electrolyte Replacment Protocol based on the criteria listed below:   1.Exclusion criteria: TCTS, ECMO, Dialysis, and Myasthenia Gravis patients 2. Is GFR >/= 30 ml/min? Yes.    Patient's GFR today is > 60 3. Is SCr </= 2? Yes.   Patient's SCr is 0.78 mg/dL 4. Did SCr increase >/= 0.5 in 24 hours? No. 5.Pt's weight >40kg  Yes.   6. Abnormal electrolyte(s): K+ 2.9  7. Electrolytes replaced per protocol 8.  Call MD STAT for K+ </= 2.5, Phos </= 1, or Mag </= 1 Physician:  Dr Valrie Hart, Jettie Booze 11/04/2022 4:23 AM

## 2022-11-05 ENCOUNTER — Inpatient Hospital Stay: Payer: No Typology Code available for payment source | Attending: Neurosurgery

## 2022-11-05 DIAGNOSIS — R569 Unspecified convulsions: Secondary | ICD-10-CM | POA: Diagnosis not present

## 2022-11-05 DIAGNOSIS — C719 Malignant neoplasm of brain, unspecified: Secondary | ICD-10-CM | POA: Diagnosis not present

## 2022-11-05 DIAGNOSIS — R4182 Altered mental status, unspecified: Secondary | ICD-10-CM | POA: Diagnosis not present

## 2022-11-05 DIAGNOSIS — G030 Nonpyogenic meningitis: Secondary | ICD-10-CM | POA: Diagnosis not present

## 2022-11-05 LAB — COMPREHENSIVE METABOLIC PANEL
ALT: 28 U/L (ref 0–44)
AST: 16 U/L (ref 15–41)
Albumin: 2.6 g/dL — ABNORMAL LOW (ref 3.5–5.0)
Alkaline Phosphatase: 84 U/L (ref 38–126)
Anion gap: 11 (ref 5–15)
BUN: 17 mg/dL (ref 8–23)
CO2: 24 mmol/L (ref 22–32)
Calcium: 8.9 mg/dL (ref 8.9–10.3)
Chloride: 106 mmol/L (ref 98–111)
Creatinine, Ser: 0.81 mg/dL (ref 0.61–1.24)
GFR, Estimated: >60 mL/min (ref >60)
Glucose, Bld: 209 mg/dL — ABNORMAL HIGH (ref 70–99)
Potassium: 3.4 mmol/L — ABNORMAL LOW (ref 3.5–5.1)
Sodium: 141 mmol/L (ref 135–145)
Total Bilirubin: 0.4 mg/dL (ref 0.3–1.2)
Total Protein: 5.7 g/dL — ABNORMAL LOW (ref 6.5–8.1)

## 2022-11-05 LAB — CBC
HCT: 31.2 % — ABNORMAL LOW (ref 39.0–52.0)
Hemoglobin: 9.9 g/dL — ABNORMAL LOW (ref 13.0–17.0)
MCH: 28.6 pg (ref 26.0–34.0)
MCHC: 31.7 g/dL (ref 30.0–36.0)
MCV: 90.2 fL (ref 80.0–100.0)
Platelets: 240 10*3/uL (ref 150–400)
RBC: 3.46 MIL/uL — ABNORMAL LOW (ref 4.22–5.81)
RDW: 17.3 % — ABNORMAL HIGH (ref 11.5–15.5)
WBC: 9.9 10*3/uL (ref 4.0–10.5)
nRBC: 0 % (ref 0.0–0.2)

## 2022-11-05 LAB — MAGNESIUM: Magnesium: 1.8 mg/dL (ref 1.7–2.4)

## 2022-11-05 LAB — GLUCOSE, CAPILLARY
Glucose-Capillary: 182 mg/dL — ABNORMAL HIGH (ref 70–99)
Glucose-Capillary: 159 mg/dL — ABNORMAL HIGH (ref 70–99)
Glucose-Capillary: 232 mg/dL — ABNORMAL HIGH (ref 70–99)
Glucose-Capillary: 308 mg/dL — ABNORMAL HIGH (ref 70–99)

## 2022-11-05 LAB — SURGICAL PATHOLOGY

## 2022-11-05 MED ORDER — AMLODIPINE BESYLATE 5 MG PO TABS
5.0000 mg | ORAL_TABLET | Freq: Every day | ORAL | Status: DC
  Administered 2022-11-05 – 2022-11-11 (×6): 5 mg via ORAL
  Filled 2022-11-05 (×7): qty 1

## 2022-11-05 MED ORDER — INSULIN ASPART 100 UNIT/ML IJ SOLN
0.0000 [IU] | INTRAMUSCULAR | Status: DC
  Administered 2022-11-05 – 2022-11-06 (×2): 8 [IU] via SUBCUTANEOUS
  Administered 2022-11-06: 15 [IU] via SUBCUTANEOUS
  Administered 2022-11-06 – 2022-11-07 (×4): 8 [IU] via SUBCUTANEOUS
  Administered 2022-11-07: 3 [IU] via SUBCUTANEOUS
  Administered 2022-11-07: 2 [IU] via SUBCUTANEOUS

## 2022-11-05 NOTE — Progress Notes (Signed)
Subjective: Less interactive today. Prelim path resulted high-grade glioma.  Exam: Vitals:   11/05/22 1700 11/05/22 1800  BP: (!) 160/100 (!) 142/96  Pulse: 90 87  Resp: 19 (!) 22  Temp:    SpO2: 100% 100%   Gen: In bed, NAD Resp: non-labored breathing, no acute distress Abd: soft, nt  Neuro: MS: Lethargic, engages with examiner, does not answer most questions, but does follow commands to stick out tongue, squeeze my fingers. CN: Visual fields full, tracks across midline in both directions, face symmetric Motor: Moves all extremities relatively symmetrically Sensory: Responds to mild stimulation bilaterally   Pertinent Labs: Creatinine 1.0  Impression: 72 year old male who presented with progressive encephalopathy who has undergone extensive workup for infectious, autoimmune, malignant etiologies, to LPs with increased protein and WBC.  Resection of R temporal mass was performed and prelim path resulted high-grade glioma. D/w Dr. Barbaraann Cao who felt that prognosis is quite poor given age and this extent of leptomeningeal disease with present clinic deficits. He does not recommend any life-prolonging measures such as radiation or chemo. Patient was also discussed this AM in CNS tumor board. Neurology is in agreement with these recommendations. I had a long discussion about the results, prognosis, and our recommendations for comfort care with son [name] by phone. Dr. Allena Katz reconsulted palliative care and held further GOC discussions with son in which patient was made DNR. Given history of seizures keppra should be continued for palliative reasons if he is transitioned to hospice  Recommendations: - Continue keppra 1g bid - Reconsult palliative care  Neurology will be available prn if we can be of any further assistance.  Bing Neighbors, MD [facility] [phone]  If 7pm- 7am, please page neurology on call as listed in AMION.

## 2022-11-05 NOTE — IPAL (Signed)
  Interdisciplinary Goals of Care Family Meeting   Date carried out: 11/05/2022  Location of the meeting: Phone conference  Member's involved: Physician and Other: Son  Durable Power of Attorney or acting medical decision maker: Son  Discussion: We discussed goals of care for Jeremy Medina. .  Based  on conversion currently DNR/DNI.  Palliative care reconsulted.  See progress note for further information.  Code status:   Code Status: DNR   Disposition: Unclear  Time spent for the meeting: 20 minutes   Lynden Oxford, MD  11/05/2022, 5:59 PM
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
## 2022-11-05 NOTE — Progress Notes (Signed)
Physical Therapy Treatment Patient Details Name: Jeremy Medina. MRN: [id number] DOB: 1951/04/23 Today's Date: 11/05/2022   History of Present Illness Jeremy Medina. is a 72 y.o. male admitted 09/28/22 with altered mental status and progressive decline.  MRI performed at [facility], concern for hydrocephalus, LP 10/03/2022.  MRI found ventriculomegaly, leptomeningeal enhancement of cerebrum, cerebellum and brainstem and some cranial nerves.  Underwent R temporal craniotomy with excision of temporal lobe mass/lesion and R ventriculocisternostomy on 10/30/22.  PMHx: type 2 diabetes, PTSD, chronic pain on methadone, hypertension, hyperlipidemia.    PT Comments    Patient progressing some during session with following commands and with verbal communication.  Also improved sitting balance with less retropulsion when engaged with using arms or flexion forward.  Patient tolerated treatment well, but limited to EOB for safety.  PT will continue to follow and progress as able.  Remains appropriate for inpatient rehab <3 hours/day.   Recommendations for follow up therapy are one component of a multi-disciplinary discharge planning process, led by the attending physician.  Recommendations may be updated based on patient status, additional functional criteria and insurance authorization.  Follow Up Recommendations  Can patient physically be transported by private vehicle: No    Assistance Recommended at Discharge Frequent or constant Supervision/Assistance  Patient can return home with the following Two people to help with walking and/or transfers;Assistance with cooking/housework;Help with stairs or ramp for entrance;Assist for transportation;Two people to help with bathing/dressing/bathroom   Equipment Recommendations  Other (comment) (TBA)    Recommendations for Other Services       Precautions / Restrictions Precautions Precautions: Fall Precaution Comments: posterior lean; R  ventriculosternostomy     Mobility  Bed Mobility Overal bed mobility: Needs Assistance Bed Mobility: Supine to Sit, Sit to Sidelying     Supine to sit: +2 for physical assistance, Total assist   Sit to sidelying: +2 for physical assistance, Max assist General bed mobility comments: HOB up and assist to move hips and legs off bed and to lift trunk; assist for down on R elbow and to lift legs onto bed.    Transfers                   General transfer comment: NT due to pt with poor sitting balance and risk for sliding out of chair    Ambulation/Gait                   Stairs             Wheelchair Mobility    Modified Rankin (Stroke Patients Only)       Balance Overall balance assessment: Needs assistance   Sitting balance-Leahy Scale: Zero Sitting balance - Comments: initially pushing some posterior, but when engaged using hands or with anterior lean for back stretch noted less posterior pushing                                    Cognition Arousal/Alertness: Awake/alert Behavior During Therapy: Flat affect Overall Cognitive Status: Impaired/Different from baseline                     Current Attention Level: Focused Memory: Decreased short-term memory Following Commands: Follows one step commands inconsistently, Follows one step commands with increased time Safety/Judgement: Decreased awareness of safety, Decreased awareness of deficits   Problem Solving: Slow processing, Decreased initiation, Requires verbal cues, Requires  tactile cues, Difficulty sequencing General Comments: few verbalizations during session; though per RN best she has seen today; able to follow commands after multiple repeated cues (mirrored two fingers up on R hand, shrugged his shoulders, reaching for knees, reaching up to touch tech's hands, etc)        Exercises Other Exercises Other Exercises: trunk flexion for stretch x 3 seated EOB Other  Exercises: UE reaching several directions Other Exercises: trunk rotation with A    General Comments General comments (skin integrity, edema, etc.): VSS      Pertinent Vitals/Pain      Home Living                          Prior Function            PT Goals (current goals can now be found in the care plan section) Progress towards PT goals: Progressing toward goals    Frequency    Min 3X/week      PT Plan Current plan remains appropriate    Co-evaluation              AM-PAC PT "6 Clicks" Mobility   Outcome Measure  Help needed turning from your back to your side while in a flat bed without using bedrails?: Total Help needed moving from lying on your back to sitting on the side of a flat bed without using bedrails?: Total Help needed moving to and from a bed to a chair (including a wheelchair)?: Total Help needed standing up from a chair using your arms (e.g., wheelchair or bedside chair)?: Total Help needed to walk in hospital room?: Total Help needed climbing 3-5 steps with a railing? : Total 6 Click Score: 6    End of Session   Activity Tolerance: Patient tolerated treatment well Patient left: in bed;with call bell/phone within reach;with bed alarm set   PT Visit Diagnosis: Other abnormalities of gait and mobility (R26.89);Other symptoms and signs involving the nervous system (R29.898);Pain     Time: 4098-1191 PT Time Calculation (min) (ACUTE ONLY): 36 min  Charges:  $Therapeutic Activity: 23-37 mins                     Sheran Lawless, PT Acute Rehabilitation Services Office:[phone] 11/05/2022    Jeremy Medina 11/05/2022, 5:25 PM

## 2022-11-05 NOTE — Inpatient Diabetes Management (Signed)
Inpatient Diabetes Program Recommendations  AACE/ADA: New Consensus Statement on Inpatient Glycemic Control (2015)  Target Ranges:  Prepandial:   less than 140 mg/dL      Peak postprandial:   less than 180 mg/dL (1-2 hours)      Critically ill patients:  140 - 180 mg/dL   Lab Results  Component Value Date   GLUCAP 232 (H) 11/05/2022   HGBA1C 9.7 (H) 09/13/2022    Review of Glycemic Control  Latest Reference Range & Units 11/04/22 16:02 11/04/22 20:01 11/04/22 23:51 11/05/22 04:07 11/05/22 07:36 11/05/22 11:08  Glucose-Capillary 70 - 99 mg/dL 161 (H) 096 (H) 045 (H) 182 (H) 159 (H) 232 (H)  (H): Data is abnormally high  Diabetes history: DM2 Outpatient Diabetes medications: 70/30 20 units BID Current orders for Inpatient glycemic control: Novolog 0-9 units Q4H  Inpatient Diabetes Program Recommendations:    Might consider adding back a small dose of basal:  Semglee 12 units QD  Will continue to follow while inpatient.  Thank you, Dulce Sellar, MSN, CDCES Diabetes Coordinator Inpatient Diabetes Program [phone] (team pager from 8a-5p)

## 2022-11-05 NOTE — Progress Notes (Signed)
This RN contacted MRI to see if available to take pt down. MRI staff stated that their schedule was full and it would have to be completed during day shift. Will notify dayshift RN.

## 2022-11-05 NOTE — Progress Notes (Signed)
[facility] Progress Note Patient: Jeremy Medina. ZOX:[id number] DOB: 1951/05/30 DOA: 09/28/2022  DOS: the patient was seen and examined on 11/05/2022  Brief hospital course: 72 year old M with PMH of DM-2, HTN, pancreatic insufficiency, GERD, chronic pain on methadone, PTSD and anxiety sent to ED from neurosurgery clinic for progressive subacute encephalopathy on 4/12.  Patient was recently hospitalized at the [facility] for subacute encephalopathy 3 months back and was thought to have NPH.  He was discharged home with neurology and neurosurgery follow-up.  CT head, MRI brain without contrast and MRV without acute finding.  Neurology consulted.  No improvement with IV thiamine.  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   LP on 4/17 and 4/25 didn't pinpoint etiology.  CSF studies including CJD unrevealing.  He also had CT chest, abdomen and pelvis that was negative for malignancy.  Neurosurgery indicated that patient is poor candidate for shunt placement on 4/30.  Neurology recommended tissue biopsy. Patient underwent right craniotomy for excision of temporal lobe mass and ventriculocisternostomy on 5/14. Developed significant seizures with persistent postictal encephalopathy requiring transfer to the ICU on 5/17.  Transferred out on 5/19.  On a higher dose of Keppra.  LTM EEG performed. 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  Neuro-oncology recommended comfort approach going forward. 5/20 based on discussion with the son currently DNR.  Son agrees with focusing on comfort in principal.  Assessment and Plan: Progressive subacute encephalopathy Initially concern for NPH High-grade glioma with leptomeningeal spread LP showed opening pressure more than 30 cm ruling out NPH. No improvement after LP x 2. No improvement after IV thiamine dose. Treated with  B12 injections, no improvement after improvement in B12 levels. Some concern about fungal meningitis at some point but felt to be less likely.  CJD labs were also sent.  Currently negative. Paraneoplastic labs were also sent. Neurology recommended biopsy with neurosurgery. Underwent right craniotomy with excision of the temporal lobe mass and ventriculocisternostomy on 5/14 with Dr. Maisie Fus. Biopsy result came back for high grade glioma. Neuro oncology, neurology recommended that for this extent of leptomeningeal disease with present clinical deficits, age >97, prognosis is quite poor.  Neurology discussed with the patient with regards to his prognosis. Palliative care consulted again.   Breakthrough seizures Developed on 5/17.  Despite being on Keppra 500 mg twice daily. Underwent LTM EEG without any active seizures after increasing the dose of the Keppra. Mentation improving somewhat.  LTM EEG discontinued on 5/19.   Normocytic anemia/BRBPR Acute blood loss secondary to stercoral ulcer. H&H stable for now. GI was consulted.  No indication for EGD or colonoscopy for now.   Uncontrolled IDDM-2 with hyperglycemia With long-term insulin use at home with neuropathy A1c 9.7% on 3/28.  On 70/30 insulin at home. Intermittently hyper and hypoglycemic. Currently on every 4 hours sliding scale.  Will change to moderate. Holding long-acting insulin. Son would like to continue to check his blood sugars even in the setting of comfort care approach.   Hypokalemia/hypomagnesemia Replaced.   Chronic pain syndrome Continue methadone 10 mg 3 times daily   Transaminitis ALT was elevated.  Now normal.  Ultrasound liver normal.   Fecal impaction/constipation Fecal impaction solved.  Now has loose stool requiring Flexi-Seal.   Benign essential hypertension Blood pressure elevated.  Add Norvasc 5 mg.   Folate deficiency Continue folate supplement.   Anxiety:  Stable.   Goals of care  discussion.  Palliative care was consulted. While the workup is being ongoing by prognosis is still poor as the patient's mentation has not improved despite multiple interventions and treatment. Concern is that the CNS tumor on the MRI could be malignant as well.  5/20 Neuro-oncology messaged that prelim biopsy appears to be high-grade glioma with leptomeningeal spread.  Prognosis is poor.  Recommend comfort measures.  Neurology discussed with the son with regards to patient's prognosis as well. I had extensive discussion with son again on 5/20.  Patient's mentation has not improved after his seizure event.  Patient remains at high risk for recurrent seizures.  Patient remains at high risk for recurrent infections.  Patient has poor p.o. intake and remains at very high risk for malnutrition.  All this event will eventually lead to patient's passing regardless of his glioblastoma diagnosis and now that biopsies positive for high-grade glioma with leptomeningeal spread this renders extremely poor prognosis for the patient. Recommended that the patient should be DNR/DNI and the son currently agrees as patient's chances of surviving a cardiac arrest without any significant neurodeficit is extremely low. Also recommended that we should focus on comfort approach only given his poor prognosis with focusing on hospice.  Will continue to have conversation about this with the family.  Palliative care reconsulted.  Pancreatic insufficiency Continue on Creon   Obesity Body mass index is 32.27 kg/m.  Placing the patient at high risk of poor outcome.   Subjective: Minimally responsive.  Unable to follow any commands.  No nausea no vomiting.  No acute events overnight.  Minimal oral intake.  Physical Exam: General: in Mild distress, No Rash Cardiovascular: S1 and S2 Present, No Murmur Respiratory: Good respiratory effort, Bilateral Air entry present. No Crackles, No wheezes Abdomen: Bowel Sound present,  No tenderness Extremities: No edema Neuro: Alert and nonverbal, unable to follow any commands.  Withdraws to painful stimuli.  Data Reviewed: I have Reviewed nursing notes, Vitals, and Lab results. Since last encounter, pertinent lab results CBC and BMP   . I have ordered test including CBC and  . I have discussed pt's care plan and test results with neurology, neuro-oncology, palliative care  .   Disposition: Status is: Inpatient Remains inpatient appropriate because: Need further clarity on goals of care conversation.  SCDs Start: 10/30/22 1818 Place and maintain sequential compression device Start: 10/02/22 1610   Family Communication: Discussed with son on the phone. Level of care: Progressive   Vitals:   11/05/22 1300 11/05/22 1400 11/05/22 1500 11/05/22 1600  BP: (!) 152/89  (!) 130/98 (!) 143/101  Pulse: 87 91 91 (!) 101  Resp: 13 18 14 19   Temp:   98.1 F (36.7 C)   TempSrc:   Oral   SpO2: 100% 100% 100% 100%  Weight:      Height:         Author: Lynden Oxford, MD 11/05/2022 5:46 PM  Please look on [email or web address] to find out who is on call.

## 2022-11-06 DIAGNOSIS — Z7189 Other specified counseling: Secondary | ICD-10-CM | POA: Diagnosis not present

## 2022-11-06 DIAGNOSIS — C719 Malignant neoplasm of brain, unspecified: Secondary | ICD-10-CM | POA: Diagnosis not present

## 2022-11-06 DIAGNOSIS — Z515 Encounter for palliative care: Secondary | ICD-10-CM | POA: Diagnosis not present

## 2022-11-06 DIAGNOSIS — G030 Nonpyogenic meningitis: Secondary | ICD-10-CM | POA: Diagnosis not present

## 2022-11-06 DIAGNOSIS — G9389 Other specified disorders of brain: Secondary | ICD-10-CM

## 2022-11-06 LAB — COMPREHENSIVE METABOLIC PANEL
ALT: 27 U/L (ref 0–44)
AST: 17 U/L (ref 15–41)
Albumin: 2.5 g/dL — ABNORMAL LOW (ref 3.5–5.0)
Alkaline Phosphatase: 87 U/L (ref 38–126)
Anion gap: 10 (ref 5–15)
BUN: 16 mg/dL (ref 8–23)
CO2: 27 mmol/L (ref 22–32)
Calcium: 8.7 mg/dL — ABNORMAL LOW (ref 8.9–10.3)
Chloride: 102 mmol/L (ref 98–111)
Creatinine, Ser: 0.89 mg/dL (ref 0.61–1.24)
GFR, Estimated: >60 mL/min (ref >60)
Glucose, Bld: 415 mg/dL — ABNORMAL HIGH (ref 70–99)
Potassium: 3.5 mmol/L (ref 3.5–5.1)
Sodium: 139 mmol/L (ref 135–145)
Total Bilirubin: 0.3 mg/dL (ref 0.3–1.2)
Total Protein: 5.5 g/dL — ABNORMAL LOW (ref 6.5–8.1)

## 2022-11-06 LAB — CBC
HCT: 28.6 % — ABNORMAL LOW (ref 39.0–52.0)
Hemoglobin: 9.4 g/dL — ABNORMAL LOW (ref 13.0–17.0)
MCH: 29.7 pg (ref 26.0–34.0)
MCHC: 32.9 g/dL (ref 30.0–36.0)
MCV: 90.5 fL (ref 80.0–100.0)
Platelets: 252 10*3/uL (ref 150–400)
RBC: 3.16 MIL/uL — ABNORMAL LOW (ref 4.22–5.81)
RDW: 17.2 % — ABNORMAL HIGH (ref 11.5–15.5)
WBC: 8.6 10*3/uL (ref 4.0–10.5)
nRBC: 0 % (ref 0.0–0.2)

## 2022-11-06 LAB — MAGNESIUM: Magnesium: 1.8 mg/dL (ref 1.7–2.4)

## 2022-11-06 LAB — GLUCOSE, CAPILLARY
Glucose-Capillary: 106 mg/dL — ABNORMAL HIGH (ref 70–99)
Glucose-Capillary: 253 mg/dL — ABNORMAL HIGH (ref 70–99)
Glucose-Capillary: 256 mg/dL — ABNORMAL HIGH (ref 70–99)
Glucose-Capillary: 354 mg/dL — ABNORMAL HIGH (ref 70–99)
Glucose-Capillary: 253 mg/dL — ABNORMAL HIGH (ref 70–99)
Glucose-Capillary: 255 mg/dL — ABNORMAL HIGH (ref 70–99)
Glucose-Capillary: 258 mg/dL — ABNORMAL HIGH (ref 70–99)

## 2022-11-06 MED ORDER — INSULIN GLARGINE-YFGN 100 UNIT/ML ~~LOC~~ SOLN
15.0000 [IU] | Freq: Every day | SUBCUTANEOUS | Status: DC
  Administered 2022-11-06 – 2022-11-07 (×2): 15 [IU] via SUBCUTANEOUS
  Filled 2022-11-06 (×3): qty 0.15

## 2022-11-06 NOTE — Progress Notes (Signed)
Palliative-   Chart reviewed. Attempted to see patient and son, however son was sleeping heavily. Per RN son requested not to be disturbed. Will try again this afternoon.   Ocie Bob, AGNP-C Palliative Medicine  No charge

## 2022-11-06 NOTE — Progress Notes (Signed)
[facility] Progress Note Patient: Jeremy Medina. WUJ:[id number] DOB: Jan 29, 1951 DOA: 09/28/2022  DOS: the patient was seen and examined on 11/06/2022  Brief hospital course: 72 year old M with PMH of DM-2, HTN, pancreatic insufficiency, GERD, chronic pain on methadone, PTSD and anxiety sent to ED from neurosurgery clinic for progressive subacute encephalopathy on 4/12.  Patient was recently hospitalized at the [facility] for subacute encephalopathy 3 months back and was thought to have NPH.  He was discharged home with neurology and neurosurgery follow-up.  CT head, MRI brain without contrast and MRV without acute finding.  Neurology consulted.  No improvement with IV thiamine.  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   LP on 4/17 and 4/25 didn't pinpoint etiology.  CSF studies including CJD unrevealing.  He also had CT chest, abdomen and pelvis that was negative for malignancy.  Neurosurgery indicated that patient is poor candidate for shunt placement on 4/30.  Neurology recommended tissue biopsy. Patient underwent right craniotomy for excision of temporal lobe mass and ventriculocisternostomy on 5/14. Developed significant seizures with persistent postictal encephalopathy requiring transfer to the ICU on 5/17.  Transferred out on 5/19.  On a higher dose of Keppra.  LTM EEG performed. 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  Neuro-oncology recommended comfort approach going forward. 5/20 based on discussion with the son currently DNR.  Son agrees with focusing on comfort in principal.  Assessment and Plan: Progressive subacute encephalopathy Initially concern for NPH High-grade glioma with leptomeningeal spread LP showed opening pressure more than 30 cm ruling out NPH. No improvement after LP x 2. No improvement after IV thiamine dose. Treated with  B12 injections, no improvement after improvement in B12 levels. Some concern about fungal meningitis at some point but felt to be less likely.  CJD labs were also sent.  Currently negative. Paraneoplastic labs were also sent. Neurology recommended biopsy with neurosurgery. Underwent right craniotomy with excision of the temporal lobe mass and ventriculocisternostomy on 5/14 with Dr. Maisie Fus. Biopsy result came back for high grade glioma. Neuro oncology, neurology recommended that for this extent of leptomeningeal disease with present clinical deficits, age >22, prognosis is quite poor.  Neurology discussed with the patient with regards to his prognosis. Palliative care consulted again.  Appreciate consultation.  Comfort care is recommended.   Breakthrough seizures Developed on 5/17.  Despite being on Keppra 500 mg twice daily. Underwent LTM EEG without any active seizures after increasing the dose of the Keppra.  Continue Keppra at current doses.   Normocytic anemia/BRBPR Acute blood loss secondary to stercoral ulcer. GI was consulted. No indication for EGD or colonoscopy for now. H&H stable for now.   Uncontrolled IDDM-2 with hyperglycemia With long-term insulin use at home with neuropathy A1c 9.7% on 3/28.  On 70/30 insulin at home. Intermittently hyper and hypoglycemic. Currently on every 4 hours sliding scale. Son would like to continue to check his blood sugars even in the setting of comfort care approach.   Hypokalemia/hypomagnesemia Replaced.   Chronic pain syndrome Continue methadone 10 mg 3 times daily   Transaminitis ALT was elevated.  Now normal.  Ultrasound liver normal.   Fecal impaction/constipation Fecal impaction solved.  Now has loose stool requiring Flexi-Seal.   Benign essential hypertension Blood pressure elevated.  Add Norvasc 5 mg.   Folate deficiency Continue folate supplement.   Anxiety Stable.   Goals of care discussion. Palliative care was  consulted. While the  workup is being ongoing by prognosis is still poor as the patient's mentation has not improved despite multiple interventions and treatment. Concern is that the CNS tumor on the MRI could be malignant as well.  Neuro-oncology messaged that prelim biopsy appears to be high-grade glioma with leptomeningeal spread.  Prognosis is poor.  Recommend comfort measures.  Neurology discussed with the son with regards to patient's prognosis as well. I had extensive discussion with son again on 5/20.  Currently DNR/DNI.  Ideally patient would benefit from focusing on comfort only.  Pancreatic insufficiency Continue on Creon  Obesity Body mass index is 32.27 kg/m.  Placing the patient at high risk of poor outcome.   Subjective: Nonverbal.  Unable to follow any commands.  No nausea no vomiting.  Able to take medications.  Physical Exam: Clear to auscultation. S1-S2 present. Bowel sound present.  No edema. Alert but nonverbal, unable to follow any commands.  Data Reviewed: I have Reviewed nursing notes, Vitals, and Lab results. Since last encounter, pertinent lab results CBC and BMP   .    Disposition: Status is: Inpatient Remains inpatient appropriate because: Need further clarity on goals of care conversation. Family Communication: Discussed with son on the phone. Level of care: Progressive   Vitals:   11/06/22 1300 11/06/22 1400 11/06/22 1500 11/06/22 1600  BP: 125/86 (!) 148/80 (!) 148/78 (!) 147/81  Pulse:   89 91  Resp: (!) 24 19 18 19   Temp:   98.4 F (36.9 C) 98.5 F (36.9 C)  TempSrc:   Axillary   SpO2:   94% 97%  Weight:      Height:         Author: Lynden Oxford, MD 11/06/2022 7:01 PM  Please look on [email or web address] to find out who is on call.

## 2022-11-06 NOTE — Progress Notes (Signed)
Subjective: No acute events overnight.   Objective: Vital signs in last 24 hours: Temp:  [97 F (36.1 C)-99.2 F (37.3 C)] 97.8 F (36.6 C) (05/21 0751) Pulse Rate:  [83-101] 92 (05/21 0751) Resp:  [13-22] 19 (05/21 0751) BP: (130-164)/(80-101) 160/80 (05/21 0751) SpO2:  [92 %-100 %] 92 % (05/21 0751)  Intake/Output from previous day: 05/20 0701 - 05/21 0700 In: -  Out: 1200 [Urine:1200] Intake/Output this shift: No intake/output data recorded.  Alert, nonverbal. FC LUE.  PERRLA Face symmetric.  Incision c/d/I/.   MAE spontaneously.   Lab Results: Recent Labs    11/05/22 0837 11/06/22 0412  WBC 9.9 8.6  HGB 9.9* 9.4*  HCT 31.2* 28.6*  PLT 240 252   BMET Recent Labs    11/05/22 0837 11/06/22 0412  NA 141 139  K 3.4* 3.5  CL 106 102  CO2 24 27  GLUCOSE 209* 415*  BUN 17 16  CREATININE 0.81 0.89  CALCIUM 8.9 8.7*    Studies/Results: No results found.  Assessment/Plan: Pt s/p R craniotomy for resective biopsy R temporal lobe lesion, and temporal lobe ventriculocisternostomy on 10/30/22.    LOS: 35 days  -Staple removal on 11/13/22 from cranial wound -Preliminary path resulted high grade glioma. Outside path consultation pending for diagnostic confirmation.  -Agree w/ plan for palliative care given transient improvement in mentation with subsequent return to baseline.  -Call w/ questions/concerns  Kiely Cousar CAYLIN Bodhi Moradi 11/06/2022, 9:57 AM

## 2022-11-06 NOTE — Inpatient Diabetes Management (Deleted)
Inpatient Diabetes Program Recommendations  AACE/ADA: New Consensus Statement on Inpatient Glycemic Control (2015)  Target Ranges:  Prepandial:   less than 140 mg/dL      Peak postprandial:   less than 180 mg/dL (1-2 hours)      Critically ill patients:  140 - 180 mg/dL   Lab Results  Component Value Date   GLUCAP 253 (H) 11/06/2022   HGBA1C 9.7 (H) 09/13/2022    Review of Glycemic Control  Latest Reference Range & Units 11/05/22 21:19 11/06/22 00:02 11/06/22 03:59 11/06/22 07:48 11/06/22 11:17  Glucose-Capillary 70 - 99 mg/dL 914 (H) 782 (H) 956 (H) 106 (H) 253 (H)  (H): Data is abnormally high  Diabetes history: DM2 Outpatient Diabetes medications: 70/30 0-20 TID Current orders for Inpatient glycemic control: Semglee 15 units QHS, Novolog 0-15 units Q4H  Inpatient Diabetes Program Recommendations:    Please consider,  Novolog 3 units TID with meals if consumes at least 50%  Will continue to follow while inpatient.  Thank you, Dulce Sellar, MSN, CDCES Diabetes Coordinator Inpatient Diabetes Program [phone] (team pager from 8a-5p)
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
## 2022-11-07 DIAGNOSIS — R569 Unspecified convulsions: Secondary | ICD-10-CM | POA: Diagnosis not present

## 2022-11-07 DIAGNOSIS — I10 Essential (primary) hypertension: Secondary | ICD-10-CM

## 2022-11-07 DIAGNOSIS — C719 Malignant neoplasm of brain, unspecified: Secondary | ICD-10-CM | POA: Diagnosis not present

## 2022-11-07 DIAGNOSIS — Z7189 Other specified counseling: Secondary | ICD-10-CM | POA: Diagnosis not present

## 2022-11-07 DIAGNOSIS — C7949 Secondary malignant neoplasm of other parts of nervous system: Secondary | ICD-10-CM | POA: Diagnosis not present

## 2022-11-07 DIAGNOSIS — E538 Deficiency of other specified B group vitamins: Secondary | ICD-10-CM | POA: Diagnosis not present

## 2022-11-07 DIAGNOSIS — F32A Depression, unspecified: Secondary | ICD-10-CM

## 2022-11-07 DIAGNOSIS — K5900 Constipation, unspecified: Secondary | ICD-10-CM

## 2022-11-07 DIAGNOSIS — K625 Hemorrhage of anus and rectum: Secondary | ICD-10-CM

## 2022-11-07 DIAGNOSIS — G9340 Encephalopathy, unspecified: Secondary | ICD-10-CM | POA: Diagnosis not present

## 2022-11-07 LAB — GLUCOSE, CAPILLARY
Glucose-Capillary: 148 mg/dL — ABNORMAL HIGH (ref 70–99)
Glucose-Capillary: 171 mg/dL — ABNORMAL HIGH (ref 70–99)
Glucose-Capillary: 257 mg/dL — ABNORMAL HIGH (ref 70–99)
Glucose-Capillary: 290 mg/dL — ABNORMAL HIGH (ref 70–99)
Glucose-Capillary: 354 mg/dL — ABNORMAL HIGH (ref 70–99)
Glucose-Capillary: 375 mg/dL — ABNORMAL HIGH (ref 70–99)
Glucose-Capillary: 95 mg/dL (ref 70–99)

## 2022-11-07 MED ORDER — LEVETIRACETAM 500 MG PO TABS
1000.0000 mg | ORAL_TABLET | Freq: Two times a day (BID) | ORAL | Status: DC
  Administered 2022-11-07 – 2022-11-08 (×2): 1000 mg via ORAL
  Filled 2022-11-07 (×4): qty 2

## 2022-11-07 MED ORDER — INSULIN ASPART 100 UNIT/ML IJ SOLN
0.0000 [IU] | Freq: Four times a day (QID) | INTRAMUSCULAR | Status: DC
  Administered 2022-11-07 – 2022-11-08 (×2): 15 [IU] via SUBCUTANEOUS
  Administered 2022-11-08 (×2): 11 [IU] via SUBCUTANEOUS

## 2022-11-07 NOTE — Progress Notes (Signed)
OT Contact Note  Patient Details Name: Jeremy Medina. MRN: [id number] DOB: 1951/01/27   Contact Note: OT speaking with son, Jeremy Medina. OT noting that patient is now comfort care, however Jeremy Medina plans on taking his father home. Jeremy Medina is wanting some education on positioning, ROM, etc to promote safe transition home. Jeremy Medina will be present on 5/23 for education. RN to notify OT team to provide education when Jeremy Medina is present.  Pollyann Glen E. Divine Hansley, OTR/L Acute Rehabilitation Services [phone]   Cherlyn Cushing 11/07/2022, 4:14 PM

## 2022-11-07 NOTE — Progress Notes (Signed)
Speech Language Pathology Treatment: Dysphagia  Patient Details Name: Jeremy Medina. MRN: [id number] DOB: [birth year] Today's Date: 11/07/2022 Time: 7829-5621 SLP Time Calculation (min) (ACUTE ONLY): 8 min  Assessment / Plan / Recommendation Clinical Impression  Patient seen for f/u diet tolerance assessment following most recent bedside swallow evaluation. Patient alert but distractible, non verbal except for one "yes" in response to being asked if he would like something to drink. Patient consumed thin liquid via straw with swift oral response time and no overt indication of aspiration.  He does continue to have periods of oral holding with pureed solids which fluctuate in nature. Oral cavity cleared with combination of cueing from clinician including empty spoon presentation and liquid wash. He was not responsive to only verbal cueing. At this time, current diet continues to remain appropriate. Per chart review, son wishes to move in direction of comfort status and palliative care notes state prognosis is < 2 weeks. SLP will sign off at this time. Please re-consult if needed.    HPI HPI: Jeremy Medina. is a 72 y.o. male admitted 09/28/22 with altered mental status and progressive decline.  MRI performed at [facility], concern for hydrocephalus, LP 10/03/2022.  MRI found ventriculomegaly, leptomeningeal enhancement of cerebrum, cerebellum and brainstem and some cranial nerves. SLP evaluated swallowing on 4/21 with recs for dysphagia 3/thin liquids.  Encephalopathy was the primary barrier to safe eating.  Underwent R temporal craniotomy with excision of temporal lobe mass/lesion and R ventriculocisternostomy on 10/30/22. On 5/17 pt had right gaze deviation and seizure; transferred to ICU. New orders for swallow eval received 5/19. PMHx: type 2 diabetes, PTSD, chronic pain on methadone, hypertension, hyperlipidemia.      SLP Plan  Discharge SLP treatment due to (comment)      Recommendations for  follow up therapy are one component of a multi-disciplinary discharge planning process, led by the attending physician.  Recommendations may be updated based on patient status, additional functional criteria and insurance authorization.    Recommendations  Diet recommendations: Dysphagia 1 (puree);Thin liquid Liquids provided via: Cup;Straw Medication Administration: Whole meds with puree Supervision: Full supervision/cueing for compensatory strategies;Trained caregiver to feed patient Compensations: Slow rate;Small sips/bites;Minimize environmental distractions Postural Changes and/or Swallow Maneuvers: Seated upright 90 degrees                  Oral care BID   None Dysphagia, oropharyngeal phase (R13.12)     Discharge SLP treatment due to (comment)    Jeremy Alers MA, CCC-SLP  Nahdia Doucet Meryl  11/07/2022, 11:08 AM

## 2022-11-07 NOTE — Progress Notes (Signed)
PROGRESS NOTE    Jeremy Medina.  UJW:[id number] DOB: 1950-07-31 DOA: 09/28/2022 [facility]CP: [facility]    Chief Complaint  Patient presents with   Weakness    Falls    Brief Narrative:  72 year old M with PMH of DM-2, HTN, pancreatic insufficiency, GERD, chronic pain on methadone, PTSD and anxiety sent to ED from neurosurgery clinic for progressive subacute encephalopathy on 4/12.  Patient was recently hospitalized at the [facility] for subacute encephalopathy 3 months back and was thought to have NPH.  He was discharged home with neurology and neurosurgery follow-up.  CT head, MRI brain without contrast and MRV without acute finding.  Neurology consulted.  No improvement with IV thiamine.  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   LP on 4/17 and 4/25 didn't pinpoint etiology.  CSF studies including CJD unrevealing.  He also had CT chest, abdomen and pelvis that was negative for malignancy.  Neurosurgery indicated that patient is poor candidate for shunt placement on 4/30.  Neurology recommended tissue biopsy. Patient underwent right craniotomy for excision of temporal lobe mass and ventriculocisternostomy on 5/14. Developed significant seizures with persistent postictal encephalopathy requiring transfer to the ICU on 5/17.  Transferred out on 5/19.  On a higher dose of Keppra.  LTM EEG performed. 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  Neuro-oncology recommended comfort approach going forward. 5/20 based on discussion with the son currently DNR.  Son agrees with focusing on comfort in principal.    Assessment & Plan:   Principal Problem:   Suspected fungal meningitis Active Problems:   Progressive subacute encephalopathy   Chronic pain syndrome   Depression   Anxiety   Hypertension   Diabetes mellitus type 2, controlled, without  complications (HCC)   Hyperlipidemia   Acute kidney injury (HCC)   Folate deficiency   Transaminitis   Obesity (BMI 30-39.9)   Hypokalemia   Dysphagia   Vomiting   Altered mental status   Fecal impaction (HCC)   Abnormal CT scan, colon   Seizure (HCC)   BRBPR (bright red blood per rectum)   Glioma (HCC)   Cancer with leptomeningeal spread (HCC)   Constipation   Benign essential hypertension  #1 progressive subacute encephalopathy/initial concern for NPH/high-grade glioma with leptomeningeal spread -Patient underwent LP during the hospitalization opening pressure of more than 30 cm ruling out NPH. -Patient also underwent LP x 2 with no significant improvement as well as no improvement after IV thiamine was given. -Status post treatment with B12 injections with no improvement. -Concern for fungal meningitis but felt less likely.  CGA DE labs were also sent noted to be negative. -Paraneoplastic labs also sent. -Patient seen by neurology who recommended biopsy per neurosurgery. -Neurosurgery consulted patient underwent right craniotomy with excision of temporal lobe mass and ventriculocisternostomy per Dr. Maisie Fus, 10/30/2022 with biopsy results consistent with high-grade glioma. -Neuro-oncology, neurology recommended due to extent of leptomeningeal disease with present clinical deficits, age > 83, prognosis was quite poor. -Neurology discussed with patient son with regards to prognosis.  Neurology noted to have discussed with patient's son in regards to patient's prognosis. -Palliative care consulted again and comfort care recommended at this time. -Supportive care.  2.  Breakthrough seizures -Patient noted to have developed breakthrough seizures 11/02/2022 despite being on Keppra 500 mg twice daily. -Patient underwent multiple EEGs during the hospitalization underwent LTM EEG without any active seizures after Keppra dose increased. -Continue current  Keppra dose.  3.  Normocytic  anemia/BRBPR -Acute blood loss anemia felt secondary to stercoral ulcer.   -Patient seen in consultation by GI and no indication for EGD or colonoscopy at this time. -Hemoglobin currently stable at 9.4.  4.  Fecal impaction/constipation -Status post multiple enemas with resolution of fecal impaction. -Patient noted to be having loose stools now requiring Flexi-Seal.  5.  Hypokalemia/hypomagnesemia -Repleted.  6.  Uncontrolled IDDM 2 with hyperglycemia with long-term insulin use at home with neuropathy -Hemoglobin A1c 9.7 on 3/28.  Patient noted to be on 70/30 insulin at home. -During the hospitalization patient noted to have some intermittent hyper and hypoglycemic episodes. -Currently on every 4 hours CBGs and SSI. -It is noted per Dr. Allena Katz and son would like to check blood sugars even in the setting of comfort care approach. -CBGs noted at 95 this morning. -Change CBGs to every 6 hours. -Continue Semglee 15 units daily, SSI.  7.  Chronic pain syndrome -Continue methadone 10 mg 3 times daily.  8.  Transaminitis -Abdominal ultrasound fatty infiltration of the liver.   -Transaminitis resolved.  9.  Hypertension -Norvasc 5 mg daily.  10.  Folate deficiency -On folate supplement.  11.  Anxiety -Stable.  12.  Goals of care discussion -Palliative care consulted and following. -It is noted that patient's prognosis is poor, patient's mentation has not improved despite multiple interventions or treatment. -Concern CNS tumor on MRI likely mid segment with biopsy consistent with high-grade glioma with leptomeningeal spread. -Neuro-oncology neurology recommending comfort measures. -Neurology noted to have discussed with patient's son in regards to patient's prognosis as well, Dr. Phill Mutter discussion with son on 5/20 and patient made a DNR/DNI.  Ideally patient would benefit from focusing on comfort only. -Palliative care following.  13.  Pancreatic  insufficiency -Creon.  14.  Obesity BMI 32.27 kg/m Patient high risk for poor outcomes.   DVT prophylaxis: ?comfort measures Code Status: DNR Family Communication: No family at bedside. Disposition: TBD??  Hospice  Status is: Inpatient Remains inpatient appropriate because: Severity of illness   Consultants:  Neurology: Dr. Otelia Limes 10/01/2022 ID: Dr. Daiva Eves 10/04/2022 Neurosurgery: Dr. Conchita Paris 10/16/2022 Palliative care: Dr. Linna Darner 10/17/2022 Gastroenterology: Dr. Rhea Belton 10/22/2022 PCCM: Dr. Craige Cotta 11/02/2022  Procedures:  CT head 09/28/2022, 10/29/2022, 10/31/2022, 11/02/2022 CT chest abdomen and pelvis 10/21/2022, 10/13/2022 Chest x-ray 09/28/2022 Abdominal films 10/27/2022 MRI venogram head 09/29/2022 MRI brain 10/19/2022, 10/26/2022 Renal ultrasound 09/28/2022 Abdominal ultrasound 10/09/2022 Lumbar puncture under fluoroscopy per IR: Dr.Veazy 10/11/2022 Lumbar puncture under fluoroscopy: Per IR Dr. Grace Isaac 10/03/2022 EEG 10/02/2022, 10/19/2022, 11/04/2022 Overnight EEG 10/13/2022, 11/02/2022 Right craniotomy for excision of temporal lobe mass/lesion/temporal lobe ventriculocisternostomy using microsurgical technique/use of intraoperative neuronavigation/use of microsurgical technique per neurosurgery: Dr. Maisie Fus 10/30/2022  Antimicrobials:  Anti-infectives (From admission, onward)    Start     Dose/Rate Route Frequency Ordered Stop   10/30/22 1915  ceFAZolin (ANCEF) IVPB 2g/100 mL premix        2 g 200 mL/hr over 30 Minutes Intravenous Every 8 hours 10/30/22 1817 10/31/22 0449   10/30/22 1817  ceFAZolin (ANCEF) IVPB 2g/100 mL premix  Status:  Discontinued        2 g 200 mL/hr over 30 Minutes Intravenous 30 min pre-op 10/30/22 1817 10/30/22 1840   10/30/22 1200  ceFAZolin (ANCEF) IVPB 2g/100 mL premix        2 g 200 mL/hr over 30 Minutes Intravenous On call to O.R. 10/30/22 0042 10/30/22 1352   10/12/22 0700  vancomycin (VANCOREADY) IVPB 750  mg/150 mL  Status:  Discontinued        750 mg 150  mL/hr over 60 Minutes Intravenous Every 12 hours 10/11/22 1811 10/12/22 1011   10/11/22 2200  cefTRIAXone (ROCEPHIN) 2 g in sodium chloride 0.9 % 100 mL IVPB  Status:  Discontinued        2 g 200 mL/hr over 30 Minutes Intravenous Every 12 hours 10/11/22 1749 10/13/22 2008   10/11/22 2000  ampicillin (OMNIPEN) 2 g in sodium chloride 0.9 % 100 mL IVPB  Status:  Discontinued        2 g 300 mL/hr over 20 Minutes Intravenous Every 4 hours 10/11/22 1752 10/12/22 1011   10/11/22 1845  cefTRIAXone (ROCEPHIN) 2 g in sodium chloride 0.9 % 100 mL IVPB  Status:  Discontinued        2 g 200 mL/hr over 30 Minutes Intravenous Every 24 hours 10/11/22 1746 10/11/22 1749   10/11/22 1845  vancomycin (VANCOREADY) IVPB 1750 mg/350 mL        1,750 mg 175 mL/hr over 120 Minutes Intravenous  Once 10/11/22 1754 10/11/22 2333   10/04/22 1500  amphotericin B liposome (AMBISOME) 500 mg in dextrose 5 % 500 mL IVPB  Status:  Discontinued        500 mg 312.5 mL/hr over 120 Minutes Intravenous Every 24 hours 10/04/22 1159 10/13/22 0904         Subjective: Alert minimally verbal. Following some commands.  RN at bedside.  Objective: Vitals:   11/07/22 0319 11/07/22 0801 11/07/22 1154 11/07/22 1512  BP: (!) 164/91 (!) 165/99 (!) 160/99 (!) 146/83  Pulse: 88 87 80 94  Resp: 15 20 17 19   Temp: 97.6 F (36.4 C) 98 F (36.7 C) 97.7 F (36.5 C) 97.7 F (36.5 C)  TempSrc: Oral Oral Oral Oral  SpO2: 91% 100% 100% 100%  Weight:      Height:        Intake/Output Summary (Last 24 hours) at 11/07/2022 1736 Last data filed at 11/07/2022 1512 Gross per 24 hour  Intake 230 ml  Output 750 ml  Net -520 ml   Filed Weights   10/04/22 1100 10/30/22 1238  Weight: 102.1 kg 102 kg    Examination:  General exam: Appears calm and comfortable.  Staples intact right craniotomy site and incision site c/d/I. Respiratory system: Clear to auscultation anterior lung fields.  No wheezes, no crackles, no rhonchi.  Fair air  movement.[name] Respiratory effort normal. Cardiovascular system: S1 & S2 heard, RRR. No JVD, murmurs, rubs, gallops or clicks. No pedal edema. Gastrointestinal system: Abdomen is nondistended, soft and nontender. No organomegaly or masses felt. Normal bowel sounds heard. Central nervous system: Alert.  Moving extremities spontaneously. No focal neurological deficits. Extremities: Symmetric 5 x 5 power. Skin: No rashes, lesions or ulcers Psychiatry: Judgement and insight appear poor. Mood & affect appropriate.     Data Reviewed: I have personally reviewed following labs and imaging studies  CBC: Recent Labs  Lab 11/02/22 0944 11/03/22 0318 11/04/22 0330 11/05/22 0837 11/06/22 0412  WBC 11.7* 13.0* 9.1 9.9 8.6  HGB 9.9* 10.0* 9.4* 9.9* 9.4*  HCT 30.5* 30.3* 28.7* 31.2* 28.6*  MCV 89.2 88.1 88.3 90.2 90.5  PLT 141* 155 197 240 252    Basic Metabolic Panel: Recent Labs  Lab 11/02/22 1954 11/03/22 0318 11/04/22 0330 11/04/22 1317 11/05/22 0837 11/06/22 0412  NA 141 141 142 141 141 139  K 3.5 3.6 2.9* 4.1 3.4* 3.5  CL 106 106 103  105 106 102  CO2 21* 25 26 28 24 27   GLUCOSE 259* 54* 144* 269* 209* 415*  BUN 17 16 15 15 17 16   CREATININE 0.95 0.79 0.78 0.84 0.81 0.89  CALCIUM 8.9 9.0 8.8* 9.1 8.9 8.7*  MG 1.8 1.9  --  1.9 1.8 1.8    GFR: Estimated Creatinine Clearance: 92.4 mL/min (by C-G formula based on SCr of 0.89 mg/dL).  Liver Function Tests: Recent Labs  Lab 11/02/22 0944 11/02/22 1954 11/05/22 0837 11/06/22 0412  AST 18 29 16 17   ALT 22 36 28 27  ALKPHOS 77 84 84 87  BILITOT 0.5 0.5 0.4 0.3  PROT 5.7* 5.9* 5.7* 5.5*  ALBUMIN 2.6* 2.6* 2.6* 2.5*    CBG: Recent Labs  Lab 11/06/22 2319 11/07/22 0318 11/07/22 0800 11/07/22 1152 11/07/22 1511  GLUCAP 290* 95 257* 171* 148*     Recent Results (from the past 240 hour(s))  Acid Fast Smear (AFB)     Status: None   Collection Time: 10/30/22  3:57 PM   Specimen: Brain  Result Value Ref Range Status    AFB Specimen Processing Comment  Final    Comment: Tissue Grinding and Digestion/Decontamination   Acid Fast Smear Negative  Final    Comment: (NOTE) Performed At: [facility] [address] [place], [name] [id number] Jolene Schimke MD GN:[id number]    Source (AFB) BIOPSY  Final    Comment: BRAIN Performed at [facility], [address], [place], [name] [address]   Fungus Culture With Stain     Status: None (Preliminary result)   Collection Time: 10/30/22  3:57 PM  Result Value Ref Range Status   Fungus Stain Final report  Final    Comment: (NOTE) Performed At: [facility] [address] [place], [name] [id number] Jolene Schimke MD GM:[id number]    Fungus (Mycology) Culture PENDING  Incomplete   Fungal Source BIOPSY  Final    Comment: BRAIN Performed at [facility], [address], [place], [name] [address]   Fungus Culture Result     Status: None   Collection Time: 10/30/22  3:57 PM  Result Value Ref Range Status   Result 1 Comment  Final    Comment: (NOTE) KOH/Calcofluor preparation:  no fungus observed. Performed At: [facility] [address] [place], [name] [id number] Jolene Schimke MD VF:[id number]   MRSA Next Gen by PCR, Nasal     Status: None   Collection Time: 10/30/22  6:25 PM   Specimen: Nasal Mucosa; Nasal Swab  Result Value Ref Range Status   MRSA by PCR Next Gen NOT DETECTED NOT DETECTED Final    Comment: (NOTE) The GeneXpert MRSA Assay (FDA approved for NASAL specimens only), is one component of a comprehensive MRSA colonization surveillance program. It is not intended to diagnose MRSA infection nor to guide or monitor treatment for MRSA infections. Test performance is not FDA approved in patients less than 23 years old. Performed at [facility], [address], [place], [name] [address]          Radiology Studies: No results found.      Scheduled Meds:  amLODipine  5 mg Oral Daily    Chlorhexidine Gluconate Cloth  6 each Topical Daily   insulin aspart  0-15 Units Subcutaneous Q4H   insulin glargine-yfgn  15 Units Subcutaneous Daily   levETIRAcetam  1,000 mg Oral BID   lipase/protease/amylase  36,000 Units Oral TID AC   methadone  10 mg Oral TID   sodium chloride flush  3 mL Intravenous Q12H   Continuous Infusions:     LOS: 36 days    Time spent: 35 minutes    Ramiro Harvest, MD [facility]   To contact the attending provider between 7A-7P or the covering provider during after hours 7P-7A, please log into the web site [email or web address] and access using universal [place] password for that web site. If you do not have the password, please call the hospital operator.  11/07/2022, 5:36 PM
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
## 2022-11-08 DIAGNOSIS — G9340 Encephalopathy, unspecified: Secondary | ICD-10-CM | POA: Diagnosis not present

## 2022-11-08 DIAGNOSIS — F32A Depression, unspecified: Secondary | ICD-10-CM | POA: Diagnosis not present

## 2022-11-08 DIAGNOSIS — R569 Unspecified convulsions: Secondary | ICD-10-CM | POA: Diagnosis not present

## 2022-11-08 DIAGNOSIS — E538 Deficiency of other specified B group vitamins: Secondary | ICD-10-CM | POA: Diagnosis not present

## 2022-11-08 LAB — GLUCOSE, CAPILLARY
Glucose-Capillary: 339 mg/dL — ABNORMAL HIGH (ref 70–99)
Glucose-Capillary: 311 mg/dL — ABNORMAL HIGH (ref 70–99)
Glucose-Capillary: 351 mg/dL — ABNORMAL HIGH (ref 70–99)
Glucose-Capillary: 78 mg/dL (ref 70–99)

## 2022-11-08 MED ORDER — INSULIN GLARGINE-YFGN 100 UNIT/ML ~~LOC~~ SOLN
20.0000 [IU] | Freq: Every day | SUBCUTANEOUS | Status: DC
  Administered 2022-11-08 – 2022-11-11 (×4): 20 [IU] via SUBCUTANEOUS
  Filled 2022-11-08 (×4): qty 0.2

## 2022-11-08 MED ORDER — LOPERAMIDE HCL 2 MG PO CAPS: 2.0000 mg | ORAL_CAPSULE | Freq: Once | ORAL | Status: DC

## 2022-11-08 MED ORDER — LOPERAMIDE HCL 2 MG PO CAPS
2.0000 mg | ORAL_CAPSULE | Freq: Once | ORAL | Status: DC
  Administered 2022-11-08: 2 mg via ORAL
  Filled 2022-11-08: qty 1

## 2022-11-08 NOTE — Progress Notes (Signed)
PROGRESS NOTE    Jeremy Medina.  WUJ:[id number] DOB: 07-05-1950 DOA: 09/28/2022 PCP: [facility]    Chief Complaint  Patient presents with   Weakness    Falls    Brief Narrative:  72 year old M with PMH of DM-2, HTN, pancreatic insufficiency, GERD, chronic pain on methadone, PTSD and anxiety sent to ED from neurosurgery clinic for progressive subacute encephalopathy on 4/12.  Patient was recently hospitalized at the [facility] for subacute encephalopathy 3 months back and was thought to have NPH.  He was discharged home with neurology and neurosurgery follow-up.  CT head, MRI brain without contrast and MRV without acute finding.  Neurology consulted.  No improvement with IV thiamine.  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   LP on 4/17 and 4/25 didn't pinpoint etiology.  CSF studies including CJD unrevealing.  He also had CT chest, abdomen and pelvis that was negative for malignancy.  Neurosurgery indicated that patient is poor candidate for shunt placement on 4/30.  Neurology recommended tissue biopsy. Patient underwent right craniotomy for excision of temporal lobe mass and ventriculocisternostomy on 5/14. Developed significant seizures with persistent postictal encephalopathy requiring transfer to the ICU on 5/17.  Transferred out on 5/19.  On a higher dose of Keppra.  LTM EEG performed. 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  Neuro-oncology recommended comfort approach going forward. 5/20 based on discussion with the son currently DNR.  Son agrees with focusing on comfort in principal.    Assessment & Plan:   Principal Problem:   Suspected fungal meningitis Active Problems:   Progressive subacute encephalopathy   Chronic pain syndrome   Depression   Anxiety   Hypertension   Diabetes mellitus type 2, controlled, without  complications (HCC)   Hyperlipidemia   Acute kidney injury (HCC)   Folate deficiency   Transaminitis   Obesity (BMI 30-39.9)   Hypokalemia   Dysphagia   Vomiting   Altered mental status   Fecal impaction (HCC)   Abnormal CT scan, colon   Seizure (HCC)   BRBPR (bright red blood per rectum)   Glioma (HCC)   Cancer with leptomeningeal spread (HCC)   Constipation   Benign essential hypertension  #1 progressive subacute encephalopathy/initial concern for NPH/high-grade glioma with leptomeningeal spread -Patient underwent LP during the hospitalization opening pressure of more than 30 cm ruling out NPH. -Patient also underwent LP x 2 with no significant improvement as well as no improvement after IV thiamine was given. -Status post treatment with B12 injections with no improvement. -Concern for fungal meningitis but felt less likely.  CGA DE labs were also sent noted to be negative. -Paraneoplastic labs also sent. -Patient seen by neurology who recommended biopsy per neurosurgery. -Neurosurgery consulted patient underwent right craniotomy with excision of temporal lobe mass and ventriculocisternostomy per Dr. Maisie Fus, 10/30/2022 with biopsy results consistent with high-grade glioma. -Neuro-oncology, neurology recommended due to extent of leptomeningeal disease with present clinical deficits, age > 56, prognosis was quite poor. -Neurology discussed with patient son with regards to prognosis.  Neurology noted to have discussed with patient's son in regards to patient's prognosis. -Palliative care consulted again and comfort care recommended at this time. -Supportive care.  2.  Breakthrough seizures -Patient noted to have developed breakthrough seizures 11/02/2022 despite being on Keppra 500 mg twice daily. -Patient underwent multiple EEGs during the hospitalization underwent LTM EEG without any active seizures after Keppra dose increased. -Continue current  Keppra dose.  3.  Normocytic  anemia/BRBPR -Acute blood loss anemia felt secondary to stercoral ulcer.   -Patient seen in consultation by GI and no indication for EGD or colonoscopy at this time. -Hemoglobin currently stable at 9.4 (11/06/2022).  4.  Fecal impaction/constipation -Status post multiple enemas with resolution of fecal impaction. -Patient noted to be having loose stools now requiring Flexi-Seal. -Imodium x 1.  5.  Hypokalemia/hypomagnesemia -Repleted. -Potassium at 3.5, magnesium at 1.8.  6.  Uncontrolled IDDM 2 with hyperglycemia with long-term insulin use at home with neuropathy -Hemoglobin A1c 9.7 on 3/28.  Patient noted to be on 70/30 insulin at home. -During the hospitalization patient noted to have some intermittent hyper and hypoglycemic episodes. -Currently on every 6 hours CBGs and SSI. -It is noted per Dr. Allena Katz and son would like to check blood sugars even in the setting of comfort care approach. -CBGs noted at 339 this morning.  -Continue every 6 hours CBGs.   -Increase Semglee to 20 units daily.   -SSI.    7.  Chronic pain syndrome -Continue methadone 10 mg 3 times daily.   8.  Transaminitis -Abdominal ultrasound fatty infiltration of the liver.   -Transaminitis resolved.  9.  Hypertension -Norvasc.   10.  Folate deficiency -Continue folate supplement.    11.  Anxiety -Stable.  12.  Goals of care discussion -Palliative care consulted and following. -It is noted that patient's prognosis is poor, patient's mentation has not improved despite multiple interventions or treatment. -Concern CNS tumor on MRI likely mid segment with biopsy consistent with high-grade glioma with leptomeningeal spread. -Neuro-oncology neurology recommending comfort measures. -Neurology noted to have discussed with patient's son in regards to patient's prognosis as well, Dr. Phill Mutter discussion with son on 5/20 and patient made a DNR/DNI.  Ideally patient would benefit from focusing on comfort  only. -Palliative care following.  13.  Pancreatic insufficiency -Continue Creon.    14.  Obesity BMI 32.27 kg/m Patient high risk for poor outcomes.   DVT prophylaxis: ?comfort measures Code Status: DNR Family Communication: No family at bedside. Disposition: TBD??  Hospice  Status is: Inpatient Remains inpatient appropriate because: Severity of illness   Consultants:  Neurology: Dr. Otelia Limes 10/01/2022 ID: Dr. Daiva Eves 10/04/2022 Neurosurgery: Dr. Conchita Paris 10/16/2022 Palliative care: Dr. Linna Darner 10/17/2022 Gastroenterology: Dr. Rhea Belton 10/22/2022 PCCM: Dr. Craige Cotta 11/02/2022  Procedures:  CT head 09/28/2022, 10/29/2022, 10/31/2022, 11/02/2022 CT chest abdomen and pelvis 10/21/2022, 10/13/2022 Chest x-ray 09/28/2022 Abdominal films 10/27/2022 MRI venogram head 09/29/2022 MRI brain 10/19/2022, 10/26/2022 Renal ultrasound 09/28/2022 Abdominal ultrasound 10/09/2022 Lumbar puncture under fluoroscopy per IR: Dr.Veazy 10/11/2022 Lumbar puncture under fluoroscopy: Per IR Dr. Grace Isaac 10/03/2022 EEG 10/02/2022, 10/19/2022, 11/04/2022 Overnight EEG 10/13/2022, 11/02/2022 Right craniotomy for excision of temporal lobe mass/lesion/temporal lobe ventriculocisternostomy using microsurgical technique/use of intraoperative neuronavigation/use of microsurgical technique per neurosurgery: Dr. Maisie Fus 10/30/2022  Antimicrobials:  Anti-infectives (From admission, onward)    Start     Dose/Rate Route Frequency Ordered Stop   10/30/22 1915  ceFAZolin (ANCEF) IVPB 2g/100 mL premix        2 g 200 mL/hr over 30 Minutes Intravenous Every 8 hours 10/30/22 1817 10/31/22 0449   10/30/22 1817  ceFAZolin (ANCEF) IVPB 2g/100 mL premix  Status:  Discontinued        2 g 200 mL/hr over 30 Minutes Intravenous 30 min pre-op 10/30/22 1817 10/30/22 1840   10/30/22 1200  ceFAZolin (ANCEF) IVPB 2g/100 mL premix        2 g 200 mL/hr  over 30 Minutes Intravenous On call to O.R. 10/30/22 0042 10/30/22 1352   10/12/22 0700  vancomycin  (VANCOREADY) IVPB 750 mg/150 mL  Status:  Discontinued        750 mg 150 mL/hr over 60 Minutes Intravenous Every 12 hours 10/11/22 1811 10/12/22 1011   10/11/22 2200  cefTRIAXone (ROCEPHIN) 2 g in sodium chloride 0.9 % 100 mL IVPB  Status:  Discontinued        2 g 200 mL/hr over 30 Minutes Intravenous Every 12 hours 10/11/22 1749 10/13/22 2008   10/11/22 2000  ampicillin (OMNIPEN) 2 g in sodium chloride 0.9 % 100 mL IVPB  Status:  Discontinued        2 g 300 mL/hr over 20 Minutes Intravenous Every 4 hours 10/11/22 1752 10/12/22 1011   10/11/22 1845  cefTRIAXone (ROCEPHIN) 2 g in sodium chloride 0.9 % 100 mL IVPB  Status:  Discontinued        2 g 200 mL/hr over 30 Minutes Intravenous Every 24 hours 10/11/22 1746 10/11/22 1749   10/11/22 1845  vancomycin (VANCOREADY) IVPB 1750 mg/350 mL        1,750 mg 175 mL/hr over 120 Minutes Intravenous  Once 10/11/22 1754 10/11/22 2333   10/04/22 1500  amphotericin B liposome (AMBISOME) 500 mg in dextrose 5 % 500 mL IVPB  Status:  Discontinued        500 mg 312.5 mL/hr over 120 Minutes Intravenous Every 24 hours 10/04/22 1159 10/13/22 0904         Subjective: Patient laying in bed alert.  Minimally verbal.  Follows some commands.    Objective: Vitals:   11/07/22 2001 11/07/22 2350 11/08/22 0306 11/08/22 0756  BP: (!) 114/100 (!) 121/94  (!) 137/98  Pulse:  (!) 107 (!) 102 93  Resp: 20 20 18 18   Temp: 97.6 F (36.4 C) (!) 97.5 F (36.4 C) 97.6 F (36.4 C) 98 F (36.7 C)  TempSrc: Oral Axillary Axillary Oral  SpO2: 98% 100% 99% 98%  Weight:      Height:        Intake/Output Summary (Last 24 hours) at 11/08/2022 1007 Last data filed at 11/08/2022 0829 Gross per 24 hour  Intake 540 ml  Output 1800 ml  Net -1260 ml    Filed Weights   10/04/22 1100 10/30/22 1238  Weight: 102.1 kg 102 kg    Examination:  General exam: NAD.  Staples intact right craniotomy site and incision site c/d/I. Respiratory system: CTAB anterior lung fields.   No wheezes, no crackles, no rhonchi.  Fair air movement.  Speaking full sentences. Cardiovascular system: RRR no murmurs rubs or gallops.  No JVD.  No lower extremity edema.  Gastrointestinal system: Abdomen is soft, nontender, nondistended, positive bowel sounds.  Flexi-Seal with loose stools.  Central nervous system: Alert.  Moving extremities spontaneously. No focal neurological deficits. Extremities: Symmetric 5 x 5 power. Skin: No rashes, lesions or ulcers Psychiatry: Judgement and insight appear poor. Mood & affect appropriate.     Data Reviewed: I have personally reviewed following labs and imaging studies  CBC: Recent Labs  Lab 11/02/22 0944 11/03/22 0318 11/04/22 0330 11/05/22 0837 11/06/22 0412  WBC 11.7* 13.0* 9.1 9.9 8.6  HGB 9.9* 10.0* 9.4* 9.9* 9.4*  HCT 30.5* 30.3* 28.7* 31.2* 28.6*  MCV 89.2 88.1 88.3 90.2 90.5  PLT 141* 155 197 240 252     Basic Metabolic Panel: Recent Labs  Lab 11/02/22 1954 11/03/22 0318 11/04/22 0330 11/04/22 1317 11/05/22  1610 11/06/22 0412  NA 141 141 142 141 141 139  K 3.5 3.6 2.9* 4.1 3.4* 3.5  CL 106 106 103 105 106 102  CO2 21* 25 26 28 24 27   GLUCOSE 259* 54* 144* 269* 209* 415*  BUN 17 16 15 15 17 16   CREATININE 0.95 0.79 0.78 0.84 0.81 0.89  CALCIUM 8.9 9.0 8.8* 9.1 8.9 8.7*  MG 1.8 1.9  --  1.9 1.8 1.8     GFR: Estimated Creatinine Clearance: 92.4 mL/min (by C-G formula based on SCr of 0.89 mg/dL).  Liver Function Tests: Recent Labs  Lab 11/02/22 0944 11/02/22 1954 11/05/22 0837 11/06/22 0412  AST 18 29 16 17   ALT 22 36 28 27  ALKPHOS 77 84 84 87  BILITOT 0.5 0.5 0.4 0.3  PROT 5.7* 5.9* 5.7* 5.5*  ALBUMIN 2.6* 2.6* 2.6* 2.5*     CBG: Recent Labs  Lab 11/07/22 1152 11/07/22 1511 11/07/22 2000 11/07/22 2352 11/08/22 0521  GLUCAP 171* 148* 354* 375* 339*      Recent Results (from the past 240 hour(s))  Acid Fast Smear (AFB)     Status: None   Collection Time: 10/30/22  3:57 PM   Specimen:  Brain  Result Value Ref Range Status   AFB Specimen Processing Comment  Final    Comment: Tissue Grinding and Digestion/Decontamination   Acid Fast Smear Negative  Final    Comment: (NOTE) Performed At: [facility] [address] [place], [name] [id number] Jolene Schimke MD JX:[id number]    Source (AFB) BIOPSY  Final    Comment: BRAIN Performed at [facility], [address], [place], [name] [address]   Fungus Culture With Stain     Status: None (Preliminary result)   Collection Time: 10/30/22  3:57 PM  Result Value Ref Range Status   Fungus Stain Final report  Final    Comment: (NOTE) Performed At: [facility] [address] [place], [name] [id number] Jolene Schimke MD BM:[id number]    Fungus (Mycology) Culture PENDING  Incomplete   Fungal Source BIOPSY  Final    Comment: BRAIN Performed at [facility], [address], [place], [name] [address]   Fungus Culture Result     Status: None   Collection Time: 10/30/22  3:57 PM  Result Value Ref Range Status   Result 1 Comment  Final    Comment: (NOTE) KOH/Calcofluor preparation:  no fungus observed. Performed At: [facility] [address] [place], [name] [id number] Jolene Schimke MD QV:[id number]   MRSA Next Gen by PCR, Nasal     Status: None   Collection Time: 10/30/22  6:25 PM   Specimen: Nasal Mucosa; Nasal Swab  Result Value Ref Range Status   MRSA by PCR Next Gen NOT DETECTED NOT DETECTED Final    Comment: (NOTE) The GeneXpert MRSA Assay (FDA approved for NASAL specimens only), is one component of a comprehensive MRSA colonization surveillance program. It is not intended to diagnose MRSA infection nor to guide or monitor treatment for MRSA infections. Test performance is not FDA approved in patients less than 59 years old. Performed at [facility], [address], [place], [name] [address]          Radiology Studies: No results  found.      Scheduled Meds:  amLODipine  5 mg Oral Daily   Chlorhexidine Gluconate Cloth  6 each Topical Daily   insulin aspart  0-15 Units Subcutaneous Q6H   insulin  glargine-yfgn  20 Units Subcutaneous Daily   levETIRAcetam  1,000 mg Oral BID   lipase/protease/amylase  36,000 Units Oral TID AC   methadone  10 mg Oral TID   sodium chloride flush  3 mL Intravenous Q12H   Continuous Infusions:     LOS: 37 days    Time spent: 35 minutes    Ramiro Harvest, MD [facility]   To contact the attending provider between 7A-7P or the covering provider during after hours 7P-7A, please log into the web site [email or web address] and access using universal [place] password for that web site. If you do not have the password, please call the hospital operator.  11/08/2022, 10:07 AM

## 2022-11-08 NOTE — TOC Progression Note (Signed)
Transition of Care (TOC) - Progression Note  Donn Pierini RN,BSN Transitions of Care Unit 4NP (Non Trauma)- RN Case Manager See Treatment Team for direct Phone #   Patient Details  Name: Jeremy Medina. MRN: [id number] Date of Birth: 09-08-1950  Transition of Care [facility]) CM/SW Contact  Zenda Alpers, [facility] Sink, RN Phone Number: 11/08/2022, 12:17 PM  Clinical Narrative:    Received referral from [facility] for Home Hospice needs.  Call made to pt's son [name] to discuss transition needs and offer Hospice Choice- spoke w/ son via TC.  Choice offered for Home Hospice needs- son has selected [facility] as he may be interested in [facility] in the future.   Discussed home DME needs- will need hospital bed and wheelchair.   Plan will be to transport home via EMS, will need GOLD DNR  for home.   Address confirmed with son is correct in epic.   Call made to [facility] liaison- Danford Bad- for home hospice needs- Danford Bad to review and contact son.   TOC to follow    Expected Discharge Plan: Skilled Nursing Facility Barriers to Discharge: Continued Medical Work up  Expected Discharge Plan and Services In-house Referral: Clinical Social Work Discharge Planning Services: CM Consult Post Acute Care Choice: Hospice Living arrangements for the past 2 months: Single Family Home                 DME Arranged: Hospice Equipment Package Others   Date DME Agency Contacted: 11/08/22   Representative spoke with at DME Agency: Per [facility]   [facility] Agency: [facility] Date [facility] Agency Contacted: 11/08/22 Time HH Agency Contacted: 1217 Representative spoke with at [facility] Agency: Danford Bad   Social Determinants of Health (SDOH) Interventions SDOH Screenings   Food Insecurity: Patient Unable To Answer (09/29/2022)  Transportation Needs: Patient Unable To Answer (09/29/2022)  Utilities: Patient Unable To Answer (09/29/2022)  Depression (PHQ2-9): Low Risk  (04/27/2019)  Tobacco  Use: High Risk (10/31/2022)    Readmission Risk Interventions     No data to display

## 2022-11-08 NOTE — Progress Notes (Signed)
Civil engineer, contracting [facility]) Hospital Liaison Note  Referral received for patient/family interest in hospice at home. [facility] liasion spoke with patient's son [name] to confirm interest. Interest confirmed.   Plan is to discharge home whenever medically ready.   No DME needs at this time.   Please send comfort medications/prescriptions home with patient at discharge.   Please call with any questions or concerns. Thank you  Dionicio Stall, [facility] Liaison [phone]

## 2022-11-09 DIAGNOSIS — F32A Depression, unspecified: Secondary | ICD-10-CM | POA: Diagnosis not present

## 2022-11-09 DIAGNOSIS — R569 Unspecified convulsions: Secondary | ICD-10-CM | POA: Diagnosis not present

## 2022-11-09 DIAGNOSIS — G9340 Encephalopathy, unspecified: Secondary | ICD-10-CM | POA: Diagnosis not present

## 2022-11-09 DIAGNOSIS — E538 Deficiency of other specified B group vitamins: Secondary | ICD-10-CM | POA: Diagnosis not present

## 2022-11-09 LAB — GLUCOSE, CAPILLARY
Glucose-Capillary: 124 mg/dL — ABNORMAL HIGH (ref 70–99)
Glucose-Capillary: 270 mg/dL — ABNORMAL HIGH (ref 70–99)
Glucose-Capillary: 291 mg/dL — ABNORMAL HIGH (ref 70–99)
Glucose-Capillary: 310 mg/dL — ABNORMAL HIGH (ref 70–99)

## 2022-11-09 MED ORDER — LEVETIRACETAM IN NACL 1000 MG/100ML IV SOLN
1000.0000 mg | Freq: Two times a day (BID) | INTRAVENOUS | Status: DC
  Administered 2022-11-09: 1000 mg via INTRAVENOUS
  Filled 2022-11-09 (×2): qty 100

## 2022-11-09 MED ORDER — INSULIN ASPART 100 UNIT/ML IJ SOLN
0.0000 [IU] | Freq: Four times a day (QID) | INTRAMUSCULAR | Status: DC
  Administered 2022-11-09: 8 [IU] via SUBCUTANEOUS
  Administered 2022-11-09: 2 [IU] via SUBCUTANEOUS
  Administered 2022-11-10: 5 [IU] via SUBCUTANEOUS
  Administered 2022-11-10: 8 [IU] via SUBCUTANEOUS
  Administered 2022-11-10 (×2): 2 [IU] via SUBCUTANEOUS
  Administered 2022-11-11: 3 [IU] via SUBCUTANEOUS

## 2022-11-09 MED ORDER — INSULIN ASPART 100 UNIT/ML IJ SOLN: 4.0000 [IU] | Freq: Three times a day (TID) | INTRAMUSCULAR | Status: DC

## 2022-11-09 MED ORDER — LEVETIRACETAM IN NACL 1500 MG/100ML IV SOLN
1500.0000 mg | Freq: Two times a day (BID) | INTRAVENOUS | Status: DC
  Administered 2022-11-09 – 2022-11-11 (×4): 1500 mg via INTRAVENOUS
  Filled 2022-11-09 (×5): qty 100

## 2022-11-09 MED ORDER — INSULIN ASPART 100 UNIT/ML IJ SOLN: 0.0000 [IU] | Freq: Three times a day (TID) | INTRAMUSCULAR | Status: DC

## 2022-11-09 MED ORDER — MORPHINE SULFATE (PF) 2 MG/ML IV SOLN
1.0000 mg | Freq: Once | INTRAVENOUS | Status: AC
  Administered 2022-11-09: 1 mg via INTRAVENOUS
  Filled 2022-11-09: qty 1

## 2022-11-09 MED ORDER — LEVETIRACETAM IN NACL 500 MG/100ML IV SOLN
500.0000 mg | Freq: Once | INTRAVENOUS | Status: AC
  Administered 2022-11-09: 500 mg via INTRAVENOUS
  Filled 2022-11-09: qty 100

## 2022-11-09 NOTE — Progress Notes (Signed)
PROGRESS NOTE    Jeremy Medina.  OZD:[id number] DOB: Feb 06, 1951 DOA: 09/28/2022 PCP: [facility]    Chief Complaint  Patient presents with   Weakness    Falls    Brief Narrative:  72 year old M with PMH of DM-2, HTN, pancreatic insufficiency, GERD, chronic pain on methadone, PTSD and anxiety sent to ED from neurosurgery clinic for progressive subacute encephalopathy on 4/12.  Patient was recently hospitalized at the [facility] for subacute encephalopathy 3 months back and was thought to have NPH.  He was discharged home with neurology and neurosurgery follow-up.  CT head, MRI brain without contrast and MRV without acute finding.  Neurology consulted.  No improvement with IV thiamine.  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   LP on 4/17 and 4/25 didn't pinpoint etiology.  CSF studies including CJD unrevealing.  He also had CT chest, abdomen and pelvis that was negative for malignancy.  Neurosurgery indicated that patient is poor candidate for shunt placement on 4/30.  Neurology recommended tissue biopsy. Patient underwent right craniotomy for excision of temporal lobe mass and ventriculocisternostomy on 5/14. Developed significant seizures with persistent postictal encephalopathy requiring transfer to the ICU on 5/17.  Transferred out on 5/19.  On a higher dose of Keppra.  LTM EEG performed. 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  Neuro-oncology recommended comfort approach going forward. 5/20 based on discussion with the son currently DNR.  Son agrees with focusing on comfort in principal.    Assessment & Plan:   Principal Problem:   Suspected fungal meningitis Active Problems:   Progressive subacute encephalopathy   Chronic pain syndrome   Depression   Anxiety   Hypertension   Diabetes mellitus type 2, controlled, without  complications (HCC)   Hyperlipidemia   Acute kidney injury (HCC)   Folate deficiency   Transaminitis   Obesity (BMI 30-39.9)   Hypokalemia   Dysphagia   Vomiting   Altered mental status   Fecal impaction (HCC)   Abnormal CT scan, colon   Seizure (HCC)   BRBPR (bright red blood per rectum)   Glioma (HCC)   Cancer with leptomeningeal spread (HCC)   Constipation   Benign essential hypertension  #1 progressive subacute encephalopathy/initial concern for NPH/high-grade glioma with leptomeningeal spread -Patient underwent LP during the hospitalization opening pressure of more than 30 cm ruling out NPH. -Patient also underwent LP x 2 with no significant improvement as well as no improvement after IV thiamine was given. -Status post treatment with B12 injections with no improvement. -Concern for fungal meningitis but felt less likely.  CGA DE labs were also sent noted to be negative. -Paraneoplastic labs also sent. -Patient seen by neurology who recommended biopsy per neurosurgery. -Neurosurgery consulted patient underwent right craniotomy with excision of temporal lobe mass and ventriculocisternostomy per Dr. Maisie Fus, 10/30/2022 with biopsy results consistent with high-grade glioma. -Neuro-oncology, neurology recommended due to extent of leptomeningeal disease with present clinical deficits, age > 64, prognosis was quite poor. -Neurology discussed with patient son with regards to prognosis.  Neurology noted to have discussed with patient's son in regards to patient's prognosis. -Palliative care consulted again and comfort care recommended at this time. -Patient likely home with hospice. -Supportive care.  2.  Breakthrough seizures -Patient noted to have developed breakthrough seizures 11/02/2022 despite being on Keppra 500 mg twice daily. -Patient underwent multiple EEGs during the hospitalization underwent LTM EEG without any active seizures after  Keppra dose increased. -Currently on  Keppra 1000 mg p.o. twice daily. -Per RN patient was some seizure-like activity overnight (11/08/2022) with tremors/jerking motions, eyes rolled back received some IV Ativan and slept most of the night. -Patient this morning with some tremors and refusing oral intake per RN. -Case discussed with neurohospitalist Dr. Selina Cooley who is very familiar with patient's case, whom recommended increasing patient's Keppra to 1500 mg twice daily. -As patient refusing oral medications this morning will change oral Keppra to IV Keppra 1500 mg twice daily. -Continue Ativan as needed.   -Continue current Keppra dose.  3.  Normocytic anemia/BRBPR -Acute blood loss anemia felt secondary to stercoral ulcer.   -Patient seen in consultation by GI and no indication for EGD or colonoscopy at this time. -Hemoglobin currently stable at 9.4 (11/06/2022).  4.  Fecal impaction/constipation -Status post multiple enemas with resolution of fecal impaction. -Patient noted to be having loose stools now requiring Flexi-Seal. -Imodium x 1 given with improvement with loose stools.  5.  Hypokalemia/hypomagnesemia -Repleted. -Potassium at 3.5, magnesium at 1.8.(11/06/2022).  6.  Uncontrolled IDDM 2 with hyperglycemia with long-term insulin use at home with neuropathy -Hemoglobin A1c 9.7 on 3/28.  Patient noted to be on 70/30 insulin at home. -During the hospitalization patient noted to have some intermittent hyper and hypoglycemic episodes. -Currently on every 6 hours CBGs and SSI. -It is noted per Dr. Allena Katz and son would like to check blood sugars even in the setting of comfort care approach. -CBGs noted at 310 this morning.  -Continue every 6 hours CBGs.   -Continue Semglee to 20 units daily.   -SSI.    7.  Chronic pain syndrome -Continue methadone 10 mg 3 times daily.  -IV morphine x 1 dose given today as per RN patient with no oral intake of medications today.  8.  Transaminitis -Abdominal ultrasound fatty  infiltration of the liver.   -Transaminitis resolved.  9.  Hypertension -Continue Norvasc.  10.  Folate deficiency -Folate supplementation.    11.  Anxiety -Stable.  12.  Goals of care discussion -Palliative care consulted and following. -It is noted that patient's prognosis is poor, patient's mentation has not improved despite multiple interventions or treatment. -Concern CNS tumor on MRI likely mid segment with biopsy consistent with high-grade glioma with leptomeningeal spread. -Neuro-oncology neurology recommending comfort measures. -Neurology noted to have discussed with patient's son in regards to patient's prognosis as well, Dr. Phill Mutter discussion with son on 5/20 and patient made a DNR/DNI.  Ideally patient would benefit from focusing on comfort only. -Patient likely home with hospice. -Palliative care following.  13.  Pancreatic insufficiency -Creon.   14.  Obesity BMI 32.27 kg/m Patient high risk for poor outcomes.   DVT prophylaxis: ?comfort measures Code Status: DNR Family Communication: No family at bedside. Disposition: Likely home with hospice.    Status is: Inpatient Remains inpatient appropriate because: Severity of illness   Consultants:  Neurology: Dr. Otelia Limes 10/01/2022 ID: Dr. Daiva Eves 10/04/2022 Neurosurgery: Dr. Conchita Paris 10/16/2022 Palliative care: Dr. Linna Darner 10/17/2022 Gastroenterology: Dr. Rhea Belton 10/22/2022 PCCM: Dr. Craige Cotta 11/02/2022  Procedures:  CT head 09/28/2022, 10/29/2022, 10/31/2022, 11/02/2022 CT chest abdomen and pelvis 10/21/2022, 10/13/2022 Chest x-ray 09/28/2022 Abdominal films 10/27/2022 MRI venogram head 09/29/2022 MRI brain 10/19/2022, 10/26/2022 Renal ultrasound 09/28/2022 Abdominal ultrasound 10/09/2022 Lumbar puncture under fluoroscopy per IR: Dr.Veazy 10/11/2022 Lumbar puncture under fluoroscopy: Per IR Dr. Grace Isaac 10/03/2022 EEG 10/02/2022, 10/19/2022, 11/04/2022 Overnight EEG 10/13/2022, 11/02/2022 Right craniotomy for excision of temporal  lobe mass/lesion/temporal lobe  ventriculocisternostomy using microsurgical technique/use of intraoperative neuronavigation/use of microsurgical technique per neurosurgery: Dr. Maisie Fus 10/30/2022  Antimicrobials:  Anti-infectives (From admission, onward)    Start     Dose/Rate Route Frequency Ordered Stop   10/30/22 1915  ceFAZolin (ANCEF) IVPB 2g/100 mL premix        2 g 200 mL/hr over 30 Minutes Intravenous Every 8 hours 10/30/22 1817 10/31/22 0449   10/30/22 1817  ceFAZolin (ANCEF) IVPB 2g/100 mL premix  Status:  Discontinued        2 g 200 mL/hr over 30 Minutes Intravenous 30 min pre-op 10/30/22 1817 10/30/22 1840   10/30/22 1200  ceFAZolin (ANCEF) IVPB 2g/100 mL premix        2 g 200 mL/hr over 30 Minutes Intravenous On call to O.R. 10/30/22 0042 10/30/22 1352   10/12/22 0700  vancomycin (VANCOREADY) IVPB 750 mg/150 mL  Status:  Discontinued        750 mg 150 mL/hr over 60 Minutes Intravenous Every 12 hours 10/11/22 1811 10/12/22 1011   10/11/22 2200  cefTRIAXone (ROCEPHIN) 2 g in sodium chloride 0.9 % 100 mL IVPB  Status:  Discontinued        2 g 200 mL/hr over 30 Minutes Intravenous Every 12 hours 10/11/22 1749 10/13/22 2008   10/11/22 2000  ampicillin (OMNIPEN) 2 g in sodium chloride 0.9 % 100 mL IVPB  Status:  Discontinued        2 g 300 mL/hr over 20 Minutes Intravenous Every 4 hours 10/11/22 1752 10/12/22 1011   10/11/22 1845  cefTRIAXone (ROCEPHIN) 2 g in sodium chloride 0.9 % 100 mL IVPB  Status:  Discontinued        2 g 200 mL/hr over 30 Minutes Intravenous Every 24 hours 10/11/22 1746 10/11/22 1749   10/11/22 1845  vancomycin (VANCOREADY) IVPB 1750 mg/350 mL        1,750 mg 175 mL/hr over 120 Minutes Intravenous  Once 10/11/22 1754 10/11/22 2333   10/04/22 1500  amphotericin B liposome (AMBISOME) 500 mg in dextrose 5 % 500 mL IVPB  Status:  Discontinued        500 mg 312.5 mL/hr over 120 Minutes Intravenous Every 24 hours 10/04/22 1159 10/13/22 0904          Subjective: Patient laying in bed eyes open minimally verbal.  Per RN patient with some seizure-like activity overnight received some IV Ativan and slept all day.  Patient with minimal oral intake today and as such unable to even get down oral Keppra.    Objective: Vitals:   11/08/22 1933 11/08/22 2319 11/09/22 0308 11/09/22 0731  BP: (!) 153/89 (!) 141/88 (!) 154/97 (!) 149/99  Pulse: (!) 102 88 95 93  Resp: 18 18 18    Temp: 98.5 F (36.9 C) 98.7 F (37.1 C)  98.7 F (37.1 C)  TempSrc: Axillary Axillary  Axillary  SpO2: 99% 96% 94% 97%  Weight:      Height:        Intake/Output Summary (Last 24 hours) at 11/09/2022 0946 Last data filed at 11/09/2022 0308 Gross per 24 hour  Intake --  Output 300 ml  Net -300 ml    Filed Weights   10/04/22 1100 10/30/22 1238  Weight: 102.1 kg 102 kg    Examination:  General exam: NAD.  Staples intact right craniotomy site and incision site c/d/I.  Bilateral upper extremity tremors noted. Respiratory system: Lungs clear to auscultation bilaterally anterior lung fields.  No wheezes, no crackles, no rhonchi.  Fair air movement.  Speaking in full sentences.  Cardiovascular system: Regular rate rhythm no murmurs rubs or gallops.  No JVD.  No lower extremity edema.   Gastrointestinal system: Abdomen is soft, nontender, nondistended, positive bowel sounds.  Flexi-Seal with no stool noted. Central nervous system: Alert.  Moving extremities spontaneously. No focal neurological deficits. Extremities: Symmetric 5 x 5 power. Skin: No rashes, lesions or ulcers Psychiatry: Judgement and insight appear poor. Mood & affect appropriate.     Data Reviewed: I have personally reviewed following labs and imaging studies  CBC: Recent Labs  Lab 11/03/22 0318 11/04/22 0330 11/05/22 0837 11/06/22 0412  WBC 13.0* 9.1 9.9 8.6  HGB 10.0* 9.4* 9.9* 9.4*  HCT 30.3* 28.7* 31.2* 28.6*  MCV 88.1 88.3 90.2 90.5  PLT 155 197 240 252     Basic Metabolic  Panel: Recent Labs  Lab 11/02/22 1954 11/03/22 0318 11/04/22 0330 11/04/22 1317 11/05/22 0837 11/06/22 0412  NA 141 141 142 141 141 139  K 3.5 3.6 2.9* 4.1 3.4* 3.5  CL 106 106 103 105 106 102  CO2 21* 25 26 28 24 27   GLUCOSE 259* 54* 144* 269* 209* 415*  BUN 17 16 15 15 17 16   CREATININE 0.95 0.79 0.78 0.84 0.81 0.89  CALCIUM 8.9 9.0 8.8* 9.1 8.9 8.7*  MG 1.8 1.9  --  1.9 1.8 1.8     GFR: Estimated Creatinine Clearance: 92.4 mL/min (by C-G formula based on SCr of 0.89 mg/dL).  Liver Function Tests: Recent Labs  Lab 11/02/22 1954 11/05/22 0837 11/06/22 0412  AST 29 16 17   ALT 36 28 27  ALKPHOS 84 84 87  BILITOT 0.5 0.4 0.3  PROT 5.9* 5.7* 5.5*  ALBUMIN 2.6* 2.6* 2.5*     CBG: Recent Labs  Lab 11/08/22 0521 11/08/22 1020 11/08/22 1743 11/08/22 2322 11/09/22 0518  GLUCAP 339* 311* 351* 78 310*      Recent Results (from the past 240 hour(s))  Acid Fast Smear (AFB)     Status: None   Collection Time: 10/30/22  3:57 PM   Specimen: Brain  Result Value Ref Range Status   AFB Specimen Processing Comment  Final    Comment: Tissue Grinding and Digestion/Decontamination   Acid Fast Smear Negative  Final    Comment: (NOTE) Performed At: [facility] [address] [place], [name] [id number] Jolene Schimke MD NW:[id number]    Source (AFB) BIOPSY  Final    Comment: BRAIN Performed at [facility], [address], [place], [name] [address]   Fungus Culture With Stain     Status: None (Preliminary result)   Collection Time: 10/30/22  3:57 PM  Result Value Ref Range Status   Fungus Stain Final report  Final    Comment: (NOTE) Performed At: [facility] [address] [place], [name] [id number] Jolene Schimke MD LK:[id number]    Fungus (Mycology) Culture PENDING  Incomplete   Fungal Source BIOPSY  Final    Comment: BRAIN Performed at [facility], [address] [place], [name] [address]   Fungus Culture Result      Status: None   Collection Time: 10/30/22  3:57 PM  Result Value Ref Range Status   Result 1 Comment  Final    Comment: (NOTE) KOH/Calcofluor preparation:  no fungus observed. Performed At: [facility] [address] [place], [name] [id number] Jolene Schimke MD LO:[id number]   MRSA Next Gen by PCR, Nasal     Status: None  Collection Time: 10/30/22  6:25 PM   Specimen: Nasal Mucosa; Nasal Swab  Result Value Ref Range Status   MRSA by PCR Next Gen NOT DETECTED NOT DETECTED Final    Comment: (NOTE) The GeneXpert MRSA Assay (FDA approved for NASAL specimens only), is one component of a comprehensive MRSA colonization surveillance program. It is not intended to diagnose MRSA infection nor to guide or monitor treatment for MRSA infections. Test performance is not FDA approved in patients less than 18 years old. Performed at [facility], [address], [place], [name] [address]          Radiology Studies: No results found.      Scheduled Meds:  amLODipine  5 mg Oral Daily   Chlorhexidine Gluconate Cloth  6 each Topical Daily   insulin aspart  0-15 Units Subcutaneous TID WC   insulin aspart  4 Units Subcutaneous TID WC   insulin glargine-yfgn  20 Units Subcutaneous Daily   levETIRAcetam  1,000 mg Oral BID   lipase/protease/amylase  36,000 Units Oral TID AC   loperamide  2 mg Oral Once   methadone  10 mg Oral TID   sodium chloride flush  3 mL Intravenous Q12H   Continuous Infusions:     LOS: 38 days    Time spent: 35 minutes    Ramiro Harvest, MD [facility]   To contact the attending provider between 7A-7P or the covering provider during after hours 7P-7A, please log into the web site [email or web address] and access using universal [place] password for that web site. If you do not have the password, please call the hospital operator.  11/09/2022, 9:46 AM

## 2022-11-09 NOTE — Progress Notes (Signed)
OT Note  Spoke with son over the phone regarding pt care, ROM and positioning. Encouraged [name] to reach out to supportive hospice services when needed.  Luisa Dago, OT/L   Acute OT Clinical Specialist Acute Rehabilitation Services Pager [phone] Office [phone]

## 2022-11-09 NOTE — TOC Progression Note (Signed)
Transition of Care (TOC) - Progression Note  Donn Pierini RN,BSN Transitions of Care Unit 4NP (Non Trauma)- RN Case Manager See Treatment Team for direct Phone #   Patient Details  Name: Jeremy Medina. MRN: [id number] Date of Birth: 1951-05-17  Transition of Care [facility]) CM/SW Contact  Zenda Alpers, [facility], RN Phone Number: 11/09/2022, 2:39 PM  Clinical Narrative:    CM reached out to [facility] liaison this am to check on DME delivery- Liaison- Shanita did not have any updates.   Call made to pt's son- per TC- [name] informed CM that DME was scheduled to be delivered today between 12-5pm -[name] is requesting a discharge on Sunday to give him and family time to finish preparing for patient to return home w/ Hospice,   CM confirmed with [facility] that they can see pt on Sunday 5/26- for Hospice admit visit- as long as pt is home by 2pm. Pt would need to discharge vis EMS Sunday AM so that Hospice RN visit can be made same day.   TOC will follow over weekend for transition home - MD has been made aware of the above.    Expected Discharge Plan: Skilled Nursing Facility Barriers to Discharge: Continued Medical Work up  Expected Discharge Plan and Services In-house Referral: Clinical Social Work Discharge Planning Services: CM Consult Post Acute Care Choice: Hospice Living arrangements for the past 2 months: Single Family Home                 DME Arranged: Hospice Equipment Package Others   Date DME Agency Contacted: 11/08/22   Representative spoke with at DME Agency: Per [facility]   [facility] Agency: [facility] Date [facility] Agency Contacted: 11/08/22 Time HH Agency Contacted: 1217 Representative spoke with at [facility] Agency: Danford Bad   Social Determinants of Health (SDOH) Interventions SDOH Screenings   Food Insecurity: Patient Unable To Answer (09/29/2022)  Transportation Needs: Patient Unable To Answer (09/29/2022)  Utilities: Patient Unable To  Answer (09/29/2022)  Depression (PHQ2-9): Low Risk  (04/27/2019)  Tobacco Use: High Risk (10/31/2022)    Readmission Risk Interventions     No data to display

## 2022-11-09 NOTE — Progress Notes (Signed)
Civil engineer, contracting [facility]) Hospital Liaison  Plan is for DME to be delivered today between 35 and 5pm. Son would like discharge on Sunday 5.26.   [facility] can see patient for admission visit at 2pm on Sunday. We have held this spot for him. If he is unable to be home by this time, please inform a member of the hospital liaison team.   Please send comfort medications/prescriptions home with patient at discharge.   Please call with any questions or concerns. Thank you  Dionicio Stall, [facility] Liaion [phone]

## 2022-11-10 DIAGNOSIS — E538 Deficiency of other specified B group vitamins: Secondary | ICD-10-CM | POA: Diagnosis not present

## 2022-11-10 DIAGNOSIS — R569 Unspecified convulsions: Secondary | ICD-10-CM | POA: Diagnosis not present

## 2022-11-10 DIAGNOSIS — F32A Depression, unspecified: Secondary | ICD-10-CM | POA: Diagnosis not present

## 2022-11-10 DIAGNOSIS — G9340 Encephalopathy, unspecified: Secondary | ICD-10-CM | POA: Diagnosis not present

## 2022-11-10 LAB — GLUCOSE, CAPILLARY
Glucose-Capillary: 132 mg/dL — ABNORMAL HIGH (ref 70–99)
Glucose-Capillary: 242 mg/dL — ABNORMAL HIGH (ref 70–99)
Glucose-Capillary: 174 mg/dL — ABNORMAL HIGH (ref 70–99)
Glucose-Capillary: 137 mg/dL — ABNORMAL HIGH (ref 70–99)
Glucose-Capillary: 69 mg/dL — ABNORMAL LOW (ref 70–99)
Glucose-Capillary: 129 mg/dL — ABNORMAL HIGH (ref 70–99)

## 2022-11-10 MED ORDER — DEXTROSE 50 % IV SOLN
INTRAVENOUS | Status: AC
  Administered 2022-11-10: 12.5 g via INTRAVENOUS
  Filled 2022-11-10: qty 50

## 2022-11-10 MED ORDER — DEXTROSE 50 % IV SOLN: 12.5000 g | INTRAVENOUS | Status: AC

## 2022-11-10 MED ORDER — SENNOSIDES-DOCUSATE SODIUM 8.6-50 MG PO TABS
1.0000 | ORAL_TABLET | Freq: Every day | ORAL | Status: DC
  Filled 2022-11-10: qty 1

## 2022-11-10 NOTE — Progress Notes (Signed)
[facility] ([facility])    Plan is for patient to discharge via PTAR with [facility] once medically clear for discharge.    If applicable, please send signed and completed DNR with patient/family upon discharge. Please provide prescriptions at discharge as needed to ensure ongoing symptom management and a transport packet.   [facility] information and contact numbers given to family and above information shared with TOC.    Please call with any questions/concerns.    Thank you for the opportunity to participate in this patient's care   Shania Junious, MSW [facility] Hospital Liaison  [phone] 

## 2022-11-10 NOTE — Progress Notes (Signed)
PROGRESS NOTE    Jeremy Medina.  ZOX:[id number] DOB: 28-Jan-1951 DOA: 09/28/2022 PCP: [facility]    Chief Complaint  Patient presents with   Weakness    Falls    Brief Narrative:  72 year old M with PMH of DM-2, HTN, pancreatic insufficiency, GERD, chronic pain on methadone, PTSD and anxiety sent to ED from neurosurgery clinic for progressive subacute encephalopathy on 4/12.  Patient was recently hospitalized at the [facility] for subacute encephalopathy 3 months back and was thought to have NPH.  He was discharged home with neurology and neurosurgery follow-up.  CT head, MRI brain without contrast and MRV without acute finding.  Neurology consulted.  No improvement with IV thiamine.  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   LP on 4/17 and 4/25 didn't pinpoint etiology.  CSF studies including CJD unrevealing.  He also had CT chest, abdomen and pelvis that was negative for malignancy.  Neurosurgery indicated that patient is poor candidate for shunt placement on 4/30.  Neurology recommended tissue biopsy. Patient underwent right craniotomy for excision of temporal lobe mass and ventriculocisternostomy on 5/14. Developed significant seizures with persistent postictal encephalopathy requiring transfer to the ICU on 5/17.  Transferred out on 5/19.  On a higher dose of Keppra.  LTM EEG performed. 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  Neuro-oncology recommended comfort approach going forward. 5/20 based on discussion with the son currently DNR.  Son agrees with focusing on comfort in principal.    Assessment & Plan:   Principal Problem:   Suspected fungal meningitis Active Problems:   Progressive subacute encephalopathy   Chronic pain syndrome   Depression   Anxiety   Hypertension   Diabetes mellitus type 2, controlled, without  complications (HCC)   Hyperlipidemia   Acute kidney injury (HCC)   Folate deficiency   Transaminitis   Obesity (BMI 30-39.9)   Hypokalemia   Dysphagia   Vomiting   Altered mental status   Fecal impaction (HCC)   Abnormal CT scan, colon   Seizure (HCC)   BRBPR (bright red blood per rectum)   Glioma (HCC)   Cancer with leptomeningeal spread (HCC)   Constipation   Benign essential hypertension  #1 progressive subacute encephalopathy/initial concern for NPH/high-grade glioma with leptomeningeal spread -Patient underwent LP during the hospitalization opening pressure of more than 30 cm ruling out NPH. -Patient also underwent LP x 2 with no significant improvement as well as no improvement after IV thiamine was given. -Status post treatment with B12 injections with no improvement. -Concern for fungal meningitis but felt less likely.  CGA DE labs were also sent noted to be negative. -Paraneoplastic labs also sent. -Patient seen by neurology who recommended biopsy per neurosurgery. -Neurosurgery consulted patient underwent right craniotomy with excision of temporal lobe mass and ventriculocisternostomy per Dr. Maisie Fus, 10/30/2022 with biopsy results consistent with high-grade glioma. -Neuro-oncology, neurology recommended due to extent of leptomeningeal disease with present clinical deficits, age > 71, prognosis was quite poor. -Neurology discussed with patient son with regards to prognosis.  Neurology noted to have discussed with patient's son in regards to patient's prognosis. -Palliative care consulted again and comfort care recommended at this time. -Patient likely home with hospice tomorrow.. -Supportive care.  2.  Breakthrough seizures -Patient noted to have developed breakthrough seizures 11/02/2022 despite being on Keppra 500 mg twice daily. -Patient underwent multiple EEGs during the hospitalization underwent LTM EEG without any active seizures  after Keppra dose increased. -Was on  Keppra 1000 mg p.o. twice daily. -Per RN patient was some seizure-like activity overnight (11/08/2022) with tremors/jerking motions, eyes rolled back received some IV Ativan and slept most of the night. -Patient this morning with no significant tremors, following some commands, tolerating oral intake per RN.  -Case discussed with neurohospitalist Dr. Selina Cooley who is very familiar with patient's case, whom recommended increasing patient's Keppra to 1500 mg twice daily which was started on 11/09/2022. -No significant further tremors noted. -Continue IV Keppra 1500 mg twice daily, Ativan as needed while in-house.  3.  Normocytic anemia/BRBPR -Acute blood loss anemia felt secondary to stercoral ulcer.   -Patient seen in consultation by GI and no indication for EGD or colonoscopy at this time. -Hemoglobin currently stable at 9.4 (11/06/2022).  4.  Fecal impaction/constipation -Status post multiple enemas with resolution of fecal impaction. -Patient noted to be having loose stools now requiring Flexi-Seal. -Imodium x 1 given with improvement with loose stools. - senokot s QHS.  5.  Hypokalemia/hypomagnesemia -Repleted. -Potassium at 3.5, magnesium at 1.8.(11/06/2022).  6.  Uncontrolled IDDM 2 with hyperglycemia with long-term insulin use at home with neuropathy -Hemoglobin A1c 9.7 on 3/28.  Patient noted to be on 70/30 insulin at home. -During the hospitalization patient noted to have some intermittent hyper and hypoglycemic episodes. -Currently on every 6 hours CBGs and SSI. -It is noted per Dr. Allena Katz, that son would like to check blood sugars even in the setting of comfort care approach. -CBGs noted at 132 this morning.  -Continue Semglee to 20 units daily.   -SSI.    7.  Chronic pain syndrome -Continue methadone 10 mg 3 times daily.  8.  Transaminitis -Abdominal ultrasound fatty infiltration of the liver.   -Transaminitis resolved.  9.  Hypertension -Norvasc.   10.  Folate  deficiency -Folate supplementation.    11.  Anxiety -Stable.  12.  Goals of care discussion -Palliative care consulted and following. -It is noted that patient's prognosis is poor, patient's mentation has not improved despite multiple interventions or treatment. -Concern CNS tumor on MRI likely mid segment with biopsy consistent with high-grade glioma with leptomeningeal spread. -Neuro-oncology neurology recommending comfort measures. -Neurology noted to have discussed with patient's son in regards to patient's prognosis as well, Dr. Phill Mutter discussion with son on 5/20 and patient made a DNR/DNI.  Ideally patient would benefit from focusing on comfort only. -Patient likely home with hospice. -Palliative care following.  13.  Pancreatic insufficiency -Continue Creon.   14.  Obesity BMI 32.27 kg/m Patient high risk for poor outcomes.   DVT prophylaxis: ?comfort measures Code Status: DNR Family Communication: No family at bedside. Disposition: Likely home with hospice tomorrow 11/11/2022.    Status is: Inpatient Remains inpatient appropriate because: Severity of illness   Consultants:  Neurology: Dr. Otelia Limes 10/01/2022 ID: Dr. Daiva Eves 10/04/2022 Neurosurgery: Dr. Conchita Paris 10/16/2022 Palliative care: Dr. Linna Darner 10/17/2022 Gastroenterology: Dr. Rhea Belton 10/22/2022 PCCM: Dr. Craige Cotta 11/02/2022  Procedures:  CT head 09/28/2022, 10/29/2022, 10/31/2022, 11/02/2022 CT chest abdomen and pelvis 10/21/2022, 10/13/2022 Chest x-ray 09/28/2022 Abdominal films 10/27/2022 MRI venogram head 09/29/2022 MRI brain 10/19/2022, 10/26/2022 Renal ultrasound 09/28/2022 Abdominal ultrasound 10/09/2022 Lumbar puncture under fluoroscopy per IR: Dr.Veazy 10/11/2022 Lumbar puncture under fluoroscopy: Per IR Dr. Grace Isaac 10/03/2022 EEG 10/02/2022, 10/19/2022, 11/04/2022 Overnight EEG 10/13/2022, 11/02/2022 Right craniotomy for excision of temporal lobe mass/lesion/temporal lobe ventriculocisternostomy using microsurgical  technique/use of intraoperative neuronavigation/use of microsurgical technique per neurosurgery: Dr. Maisie Fus 10/30/2022  Antimicrobials:  Anti-infectives (From  admission, onward)    Start     Dose/Rate Route Frequency Ordered Stop   10/30/22 1915  ceFAZolin (ANCEF) IVPB 2g/100 mL premix        2 g 200 mL/hr over 30 Minutes Intravenous Every 8 hours 10/30/22 1817 10/31/22 0449   10/30/22 1817  ceFAZolin (ANCEF) IVPB 2g/100 mL premix  Status:  Discontinued        2 g 200 mL/hr over 30 Minutes Intravenous 30 min pre-op 10/30/22 1817 10/30/22 1840   10/30/22 1200  ceFAZolin (ANCEF) IVPB 2g/100 mL premix        2 g 200 mL/hr over 30 Minutes Intravenous On call to O.R. 10/30/22 0042 10/30/22 1352   10/12/22 0700  vancomycin (VANCOREADY) IVPB 750 mg/150 mL  Status:  Discontinued        750 mg 150 mL/hr over 60 Minutes Intravenous Every 12 hours 10/11/22 1811 10/12/22 1011   10/11/22 2200  cefTRIAXone (ROCEPHIN) 2 g in sodium chloride 0.9 % 100 mL IVPB  Status:  Discontinued        2 g 200 mL/hr over 30 Minutes Intravenous Every 12 hours 10/11/22 1749 10/13/22 2008   10/11/22 2000  ampicillin (OMNIPEN) 2 g in sodium chloride 0.9 % 100 mL IVPB  Status:  Discontinued        2 g 300 mL/hr over 20 Minutes Intravenous Every 4 hours 10/11/22 1752 10/12/22 1011   10/11/22 1845  cefTRIAXone (ROCEPHIN) 2 g in sodium chloride 0.9 % 100 mL IVPB  Status:  Discontinued        2 g 200 mL/hr over 30 Minutes Intravenous Every 24 hours 10/11/22 1746 10/11/22 1749   10/11/22 1845  vancomycin (VANCOREADY) IVPB 1750 mg/350 mL        1,750 mg 175 mL/hr over 120 Minutes Intravenous  Once 10/11/22 1754 10/11/22 2333   10/04/22 1500  amphotericin B liposome (AMBISOME) 500 mg in dextrose 5 % 500 mL IVPB  Status:  Discontinued        500 mg 312.5 mL/hr over 120 Minutes Intravenous Every 24 hours 10/04/22 1159 10/13/22 0904         Subjective: Laying in bed sleeping easily arousable.  Following some commands.  No  chest pain.  No shortness of breath.  No seizures reported overnight per RN.  Took medications this morning with some improved oral intake per RN.   Objective: Vitals:   11/10/22 0003 11/10/22 0812 11/10/22 1028 11/10/22 1031  BP: (!) 141/77 (!) 142/75 (!) 148/72 (!) 142/83  Pulse: (!) 105 95  97  Resp: 20 20    Temp: 98.2 F (36.8 C) (!) 97.1 F (36.2 C)    TempSrc: Axillary     SpO2:  100%  100%  Weight:      Height:        Intake/Output Summary (Last 24 hours) at 11/10/2022 1117 Last data filed at 11/10/2022 1039 Gross per 24 hour  Intake 200 ml  Output 1300 ml  Net -1100 ml    Filed Weights   10/04/22 1100 10/30/22 1238  Weight: 102.1 kg 102 kg    Examination:  General exam: NAD.  Staples intact right craniotomy site and incision site c/d/I.  No significant tremors noted today. Respiratory system: CTAB anterior lung fields.  No wheezes, no crackles, no rhonchi.  Fair air movement.  Normal respiratory effort.  Cardiovascular system: RRR no murmurs rubs or gallops.  No JVD.  No lower extremity edema.  Gastrointestinal system: Abdomen is  soft, nontender, nondistended, positive bowel sounds.  Flexi-Seal with no stool noted. Central nervous system: Alert.  Moving extremities spontaneously. No focal neurological deficits. Extremities: Symmetric 5 x 5 power. Skin: No rashes, lesions or ulcers Psychiatry: Judgement and insight appear poor. Mood & affect appropriate.     Data Reviewed: I have personally reviewed following labs and imaging studies  CBC: Recent Labs  Lab 11/04/22 0330 11/05/22 0837 11/06/22 0412  WBC 9.1 9.9 8.6  HGB 9.4* 9.9* 9.4*  HCT 28.7* 31.2* 28.6*  MCV 88.3 90.2 90.5  PLT 197 240 252     Basic Metabolic Panel: Recent Labs  Lab 11/04/22 0330 11/04/22 1317 11/05/22 0837 11/06/22 0412  NA 142 141 141 139  K 2.9* 4.1 3.4* 3.5  CL 103 105 106 102  CO2 26 28 24 27   GLUCOSE 144* 269* 209* 415*  BUN 15 15 17 16   CREATININE 0.78 0.84 0.81  0.89  CALCIUM 8.8* 9.1 8.9 8.7*  MG  --  1.9 1.8 1.8     GFR: Estimated Creatinine Clearance: 92.4 mL/min (by C-G formula based on SCr of 0.89 mg/dL).  Liver Function Tests: Recent Labs  Lab 11/05/22 0837 11/06/22 0412  AST 16 17  ALT 28 27  ALKPHOS 84 87  BILITOT 0.4 0.3  PROT 5.7* 5.5*  ALBUMIN 2.6* 2.5*     CBG: Recent Labs  Lab 11/09/22 1001 11/09/22 1650 11/09/22 2336 11/10/22 0517 11/10/22 1018  GLUCAP 291* 124* 270* 132* 242*      No results found for this or any previous visit (from the past 240 hour(s)).        Radiology Studies: No results found.      Scheduled Meds:  amLODipine  5 mg Oral Daily   Chlorhexidine Gluconate Cloth  6 each Topical Daily   insulin aspart  0-15 Units Subcutaneous Q6H   insulin glargine-yfgn  20 Units Subcutaneous Daily   lipase/protease/amylase  36,000 Units Oral TID AC   loperamide  2 mg Oral Once   methadone  10 mg Oral TID   sodium chloride flush  3 mL Intravenous Q12H   Continuous Infusions:  levETIRAcetam 1,500 mg (11/10/22 1039)      LOS: 39 days    Time spent: 35 minutes    Ramiro Harvest, MD [facility]   To contact the attending provider between 7A-7P or the covering provider during after hours 7P-7A, please log into the web site [email or web address] and access using universal [place] password for that web site. If you do not have the password, please call the hospital operator.  11/10/2022, 11:17 AM

## 2022-11-11 DIAGNOSIS — G030 Nonpyogenic meningitis: Secondary | ICD-10-CM | POA: Diagnosis not present

## 2022-11-11 DIAGNOSIS — N179 Acute kidney failure, unspecified: Secondary | ICD-10-CM | POA: Diagnosis not present

## 2022-11-11 DIAGNOSIS — E119 Type 2 diabetes mellitus without complications: Secondary | ICD-10-CM | POA: Diagnosis not present

## 2022-11-11 DIAGNOSIS — C7949 Secondary malignant neoplasm of other parts of nervous system: Secondary | ICD-10-CM | POA: Diagnosis not present

## 2022-11-11 LAB — GLUCOSE, CAPILLARY
Glucose-Capillary: 140 mg/dL — ABNORMAL HIGH (ref 70–99)
Glucose-Capillary: 178 mg/dL — ABNORMAL HIGH (ref 70–99)

## 2022-11-11 MED ORDER — BISACODYL 10 MG RE SUPP: 10.0000 mg | Freq: Every day | RECTAL | 0 refills | Status: DC | PRN

## 2022-11-11 MED ORDER — SENNOSIDES-DOCUSATE SODIUM 8.6-50 MG PO TABS: 1.0000 | ORAL_TABLET | Freq: Every day | ORAL | 1 refills | Status: DC

## 2022-11-11 MED ORDER — LORAZEPAM 1 MG PO TABS: 1.0000 mg | ORAL_TABLET | Freq: Every day | ORAL | 0 refills | Status: DC

## 2022-11-11 MED ORDER — AMLODIPINE BESYLATE 5 MG PO TABS: 5.0000 mg | ORAL_TABLET | Freq: Every day | ORAL | 1 refills | Status: DC

## 2022-11-11 MED ORDER — LEVETIRACETAM 500 MG PO TABS: 1500.0000 mg | ORAL_TABLET | Freq: Two times a day (BID) | ORAL | 1 refills | Status: DC

## 2022-11-11 MED ORDER — METHADONE HCL 10 MG PO TABS: 10.0000 mg | ORAL_TABLET | Freq: Three times a day (TID) | ORAL | 0 refills | Status: DC

## 2022-11-11 NOTE — Discharge Summary (Addendum)
Physician Discharge Summary  Jeremy Medina. MVH:[id number] DOB: November 29, 1950 DOA: 09/28/2022  PCP: [facility]  Admit date: 09/28/2022 Discharge date: 11/11/2022  Time spent: 60 minutes  Recommendations for Outpatient Follow-up:  Patient will be discharged home with hospice.  Staples will need to be removed from craniotomy site 11/13/2022. Follow-up with [facility] in 3 weeks.   Discharge Diagnoses:  Principal Problem:   Suspected fungal meningitis Active Problems:   Progressive subacute encephalopathy   Chronic pain syndrome   Depression   Anxiety   Hypertension   Diabetes mellitus type 2, controlled, without complications (HCC)   Hyperlipidemia   Acute kidney injury (HCC)   Folate deficiency   Transaminitis   Obesity (BMI 30-39.9)   Hypokalemia   Dysphagia   Vomiting   Altered mental status   Fecal impaction (HCC)   Abnormal CT scan, colon   Seizure (HCC)   BRBPR (bright red blood per rectum)   Glioma (HCC)   Cancer with leptomeningeal spread (HCC)   Constipation   Benign essential hypertension   Discharge Condition: Stable  Diet recommendation: Dysphagia 1 diet  Filed Weights   10/04/22 1100 10/30/22 1238  Weight: 102.1 kg 102 kg    History of present illness:  HPI per Dr. Malvin Johns [name]. is a 72 y.o. male with medical history significant for insulin-dependent type 2 diabetes, HTN, HLD, chronic back pain on methadone, PTSD, depression, anxiety on chronic benzos who presents to the ED for evaluation of progressive functional decline.  Patient is unable to provide history due to encephalopathy which is otherwise obtained by EDP, chart review, and son by phone.   Son states that patient was independently functional and communicating well until the end of January when he had sudden decline in functional status and progressive encephalopathy.   He was admitted at the [place] last month and workup including MRI brain was suggestive  of communicating hydrocephalus on MRI head 09/05/2022.  He is ultimately discharged to home with referrals to neurology and neurosurgery.     MRI brain 3/29 in our system was negative for acute intracranial abnormality.  Generalized volume loss noted.   He was seen by Dr. Maisie Fus, [place] neuro[facility] in clinic earlier today.  Per son, patient was sent to the ED for consideration of LP.   Patient has required full assistance from son for care at home.  Son is a disabled veteran himself and can no longer adequately care for patient.  Son states that patient does not drink alcohol regularly, he quit drinking in his 30s.  He is on chronic methadone for back pain and Ativan for anxiety.   ED Course  Labs/Imaging on admission: I have personally reviewed following labs and imaging studies.   Initial vitals showed BP 93/69, pulse 116, RR 18, temp 97.9 F, SpO2 95% on room air.   Labs show WBC 21.0, hemoglobin 15.8, platelets 230,000, sodium 141, potassium 3.8, bicarb 22, BUN 57, creatinine 2.32, serum glucose 146, AST 54, ALT 53, alk phos 70, total bilirubin 0.9, serum ethanol <10.   CT head without contrast negative for acute intracranial findings.  Small vessel disease noted.   CT cervical spine without contrast negative for recent fracture.  Cervical spondylosis with bony spurs causing extrinsic pressure over the ventral margin of the thecal sac and encroachment of neural foramina at multiple levels noted.  Interval progression of degenerative changes seen.   Patient was given 1 L LR.  The hospitalist service was  consulted to admit for further evaluation and management.   Hospital Course:  #1 progressive subacute encephalopathy/initial concern for NPH/high-grade glioma with leptomeningeal spread -Patient underwent LP during the hospitalization opening pressure of more than 30 cm ruling out NPH. -Patient also underwent LP x 2 with no significant improvement as well as no improvement after IV  thiamine was given. -Status post treatment with B12 injections with no improvement. -Concern for fungal meningitis but felt less likely.  CGA DE labs were also sent noted to be negative. -Paraneoplastic labs also sent. -Patient seen by neurology who recommended biopsy per neurosurgery. -Neurosurgery consulted patient underwent right craniotomy with excision of temporal lobe mass and ventriculocisternostomy per Dr. Maisie Fus, 10/30/2022 with biopsy results consistent with high-grade glioma. -Neuro-oncology, neurology recommended due to extent of leptomeningeal disease with present clinical deficits, age > 77, prognosis was quite poor. -Neurology discussed with patient son with regards to prognosis.  Neurology noted to have discussed with patient's son in regards to patient's prognosis. -Palliative care consulted again and comfort care recommended at this time. -Patient home with hospice.   2.  Breakthrough seizures -Patient noted to have developed breakthrough seizures 11/02/2022 despite being on Keppra 500 mg twice daily. -Patient underwent multiple EEGs during the hospitalization underwent LTM EEG without any active seizures after Keppra dose increased. -Was on Keppra 1000 mg p.o. twice daily. -Per RN patient was some seizure-like activity overnight (11/08/2022) with tremors/jerking motions, eyes rolled back received some IV Ativan and slept most of the night. -Patient the morning of 11/10/2022 with no significant tremors, following some commands, tolerating oral intake per RN after adjustments of Keppra made..  -Case discussed with neurohospitalist Dr. Selina Cooley who is very familiar with patient's case, whom recommended increasing patient's Keppra to 1500 mg twice daily which was started on 11/09/2022. -No significant further tremors noted. -Patient maintained on IV Keppra 1500 mg twice daily during the hospitalization as well as Ativan as needed and will be discharged home on oral Keppra 1500 mg twice  daily.   -Patient will be discharged home with hospice.    3.  Normocytic anemia/BRBPR -Acute blood loss anemia felt secondary to stercoral ulcer.   -Patient seen in consultation by GI and no indication for EGD or colonoscopy at this time. -Hemoglobin stable at 9.4 (11/06/2022). -Patient will be discharged home with hospice.   4.  Fecal impaction/constipation -Status post multiple enemas with resolution of fecal impaction. -Patient noted to be having loose stools now requiring Flexi-Seal. -Imodium x 1 given with improvement with loose stools. - senokot s QHS.   5.  Hypokalemia/hypomagnesemia -Repleted. -Potassium at 3.5, magnesium at 1.8.(11/06/2022).   6.  Uncontrolled IDDM 2 with hyperglycemia with long-term insulin use at home with neuropathy -Hemoglobin A1c 9.7 on 3/28.  Patient noted to be on 70/30 insulin at home. -During the hospitalization patient noted to have some intermittent hyper and hypoglycemic episodes. -Patient subsequently was maintained on every 6 hours CBGs and SSI and started on Semglee 20 units daily with better blood glucose control.   -It is noted per Dr. Allena Katz, that son would like to check blood sugars even in the setting of comfort care approach. -Patient be discharged back home on home regimen of insulin.   7.  Chronic pain syndrome -Patient was maintained on home regimen of methadone 10 mg 3 times daily.   8.  Transaminitis -Abdominal ultrasound fatty infiltration of the liver.   -Transaminitis resolved.   9.  Hypertension -Patient maintained on half home regimen of  Norvasc at 5 mg daily.  Lisinopril was discontinued.   -Patient will be discharged home with hospice.   10.  Folate deficiency -Patient initially maintained on folate supplementation.     11.  Anxiety -Stable.   12.  Goals of care discussion -Palliative care consulted and following. -It is noted that patient's prognosis is poor, patient's mentation has not improved despite multiple  interventions or treatment. -Concern CNS tumor on MRI likely mid segment with biopsy consistent with high-grade glioma with leptomeningeal spread. -Neuro-oncology neurology recommending comfort measures. -Neurology noted to have discussed with patient's son in regards to patient's prognosis as well, Dr. Phill Mutter discussion with son on 5/20 and patient made a DNR/DNI.  Ideally patient would benefit from focusing on comfort only. -Patient home with hospice.   13.  Pancreatic insufficiency -Continue Creon.    14.  Obesity BMI 32.27 kg/m Patient high risk for poor outcomes.    Procedures: CT head 09/28/2022, 10/29/2022, 10/31/2022, 11/02/2022 CT chest abdomen and pelvis 10/21/2022, 10/13/2022 Chest x-ray 09/28/2022 Abdominal films 10/27/2022 MRI venogram head 09/29/2022 MRI brain 10/19/2022, 10/26/2022 Renal ultrasound 09/28/2022 Abdominal ultrasound 10/09/2022 Lumbar puncture under fluoroscopy per IR: Dr.Veazy 10/11/2022 Lumbar puncture under fluoroscopy: Per IR Dr. Grace Isaac 10/03/2022 EEG 10/02/2022, 10/19/2022, 11/04/2022 Overnight EEG 10/13/2022, 11/02/2022 Right craniotomy for excision of temporal lobe mass/lesion/temporal lobe ventriculocisternostomy using microsurgical technique/use of intraoperative neuronavigation/use of microsurgical technique per neurosurgery: Dr. Maisie Fus 10/30/2022  Consultations: Neurology: Dr. Otelia Limes 10/01/2022 ID: Dr. Daiva Eves 10/04/2022 Neurosurgery: Dr. Conchita Paris 10/16/2022 Palliative care: Dr. Linna Darner 10/17/2022 Gastroenterology: Dr. Rhea Belton 10/22/2022 PCCM: Dr. Craige Cotta 11/02/2022  Discharge Exam: Vitals:   11/11/22 1122 11/11/22 1143  BP: 122/76 122/76  Pulse:  95  Resp:  17  Temp:  (!) 97.4 F (36.3 C)  SpO2:  99%    General: NAD Cardiovascular: RRR no murmurs rubs or gallops.  No JVD.  No lower extremity edema Respiratory: CTAB anterior lung fields.  Discharge Instructions   Discharge Instructions     Diet general   Complete by: As directed    Dysphagia 1  diet   Discharge wound care:   Complete by: As directed    Staples to be removed 11/13/2022   Increase activity slowly   Complete by: As directed    Increase activity slowly   Complete by: As directed    No wound care   Complete by: As directed       Allergies as of 11/11/2022       Reactions   Bee Venom Anaphylaxis        Medication List     STOP taking these medications    lisinopril 5 MG tablet Commonly known as: ZESTRIL   metroNIDAZOLE 250 MG tablet Commonly known as: FLAGYL   ondansetron 4 MG disintegrating tablet Commonly known as: ZOFRAN-ODT   tetracycline 500 MG capsule Commonly known as: SUMYCIN   VITAMIN D PO       TAKE these medications    amLODipine 5 MG tablet Commonly known as: NORVASC Take 1 tablet (5 mg total) by mouth daily. What changed:  medication strength how much to take   bisacodyl 10 MG suppository Commonly known as: DULCOLAX Place 1 suppository (10 mg total) rectally daily as needed for moderate constipation.   bismuth subsalicylate 262 MG chewable tablet Commonly known as: PEPTO BISMOL Chew 524 mg by mouth 4 (four) times daily.   insulin aspart protamine- aspart (70-30) 100 UNIT/ML injection Commonly known as: NOVOLOG MIX 70/30 Inject 0-20 Units into the skin  3 (three) times daily.   levETIRAcetam 500 MG tablet Commonly known as: Keppra Take 3 tablets (1,500 mg total) by mouth 2 (two) times daily.   lipase/protease/amylase 12000-38000 units Cpep capsule Commonly known as: CREON Take 36,000 Units by mouth 3 (three) times daily before meals.   LORazepam 1 MG tablet Commonly known as: ATIVAN Take 1 tablet (1 mg total) by mouth at bedtime.   methadone 10 MG tablet Commonly known as: DOLOPHINE Take 1 tablet (10 mg total) by mouth 3 (three) times daily.   metoCLOPramide 10 MG tablet Commonly known as: REGLAN Take 5 mg by mouth every 6 (six) hours as needed for vomiting or nausea.   pantoprazole 40 MG  tablet Commonly known as: PROTONIX Take 40 mg by mouth daily.   rosuvastatin 40 MG tablet Commonly known as: CRESTOR Take 40 mg by mouth daily.   senna-docusate 8.6-50 MG tablet Commonly known as: Senokot-S Take 1 tablet by mouth at bedtime.               Discharge Care Instructions  (From admission, onward)           Start     Ordered   11/11/22 0000  Discharge wound care:       Comments: Staples to be removed 11/13/2022   11/11/22 0940           Allergies  Allergen Reactions   Bee Venom Anaphylaxis    Follow-up Information     [facility] Follow up.   Specialty: Hospice and Palliative Medicine Why: Home Hospice referral Will need staples removed from craniotimy 11/13/2022 Contact information: [address] [facility] [place] [address] [phone]        [facility] Schedule an appointment as soon as possible for a visit in 3 week(s).   Contact information: [address] [facility] [address] [name] [address] [phone]                  The results of significant diagnostics from this hospitalization (including imaging, microbiology, ancillary and laboratory) are listed below for reference.    Significant Diagnostic Studies: Overnight EEG with video  Result Date: 11/03/2022 Charlsie Quest, MD     11/04/2022  8:39 AM Patient Name: Jeremy Medina. MRN: [id number] Epilepsy Attending: Charlsie Quest Referring Physician/Provider: Elmer Picker, NP Duration: 11/02/2022 2005 to 11/03/2022 2005 Patient history: 72yo M had an 8 minute seizure getting eeg to evaluate for seizure. Level of alertness: Awake, asleep AEDs during EEG study: LEV Technical aspects: This EEG study was done with scalp electrodes positioned according to the 10-20 International system of electrode placement. F8 not used due to bandage. Electrical activity was reviewed with band pass filter of 1-70Hz , sensitivity of 7 uV/mm, display speed of  36mm/sec with a 60Hz  notched filter applied as appropriate. EEG data were recorded continuously and digitally stored.  Video monitoring was available and reviewed as appropriate. Description: No clear posterior dominant rhythm was seen. EEG showed continuous generalized 3 to 6 Hz theta-delta slowing. Sleep was characterized by sleep spindles (12-14z) maximal fronto-central region. Generalized periodic discharges with triphasic morphology at 1 Hz were noted, more prominent when awake/stimulated.  Hyperventilation and photic stimulation were not performed.   Event button was pressed on 11/03/2022 at 0913 for right hand twitching, Initially camera was blocked by care team but when visible, patient was noted to be slowly moving right hand, no definite rhythmic activity was noted.. Concomitant eeg before, during and after the event  didn't show any eeg change to suggest seizure.  ABNORMALITY - Periodic discharges with triphasic morphology, generalized - Continuous slow, generalized  IMPRESSION: This study is suggestive of moderate diffuse encephalopathy, nonspecific etiology but likely related to toxic-metabolic causes. No seizures or definite epileptiform discharges were seen throughout the recording. One event was recorded on 11/03/2022 at 0913 as described above without concomitant eeg change. This was most likely NOT an epileptic event.  Priyanka Annabelle Harman   CT HEAD WO CONTRAST ( )  Result Date: 11/02/2022 CLINICAL DATA:  Initial evaluation for mental status change. EXAM: CT HEAD WITHOUT CONTRAST TECHNIQUE: Contiguous axial images were obtained from the base of the skull through the vertex without intravenous contrast. RADIATION DOSE REDUCTION: This exam was performed according to the departmental dose-optimization program which includes automated exposure control, adjustment of the mA and/or kV according to patient size and/or use of iterative reconstruction technique. COMPARISON:  CT from 10/31/2022. FINDINGS:  Brain: Postoperative changes from prior right temporal craniotomy for resection/biopsy, stable. Persistent ventriculomegaly with communicating hydrocephalus, not appreciably changed. Associated transependymal flow of CSF, most notable about the left temporal horn, similar. Supratentorial hypodensity also similar. No acute intracranial hemorrhage. No acute large vessel territory infarct. No new mass lesion or midline shift. No significant extra-axial fluid collection. Vascular: No abnormal hyperdense vessel. Scattered vascular calcifications noted within the carotid siphons. Skull: Prior right craniotomy without adverse features. Sinuses/Orbits: Right gaze preference. Paranasal sinuses and mastoid air cells remain largely clear. Other: None. IMPRESSION: 1. Stable head CT.  No new acute intracranial abnormality. 2. Postoperative changes from prior right temporal craniotomy for resection/biopsy, stable. 3. Persistent ventriculomegaly with communicating hydrocephalus, not appreciably changed. Electronically Signed   By: Rise Mu M.D.   On: 11/02/2022 19:27   CT HEAD WO CONTRAST ( )  Result Date: 10/31/2022 CLINICAL DATA:  Headache with neuro deficit. New right gaze preference. EXAM: CT HEAD WITHOUT CONTRAST TECHNIQUE: Contiguous axial images were obtained from the base of the skull through the vertex without intravenous contrast. RADIATION DOSE REDUCTION: This exam was performed according to the departmental dose-optimization program which includes automated exposure control, adjustment of the mA and/or kV according to patient size and/or use of iterative reconstruction technique. COMPARISON:  Earlier today FINDINGS: Brain: No evidence of acute infarction, hemorrhage, extra-axial collection or mass lesion/mass effect. Generalized ventriculomegaly suggesting communicating hydrocephalus in this patient with enhancing leptomeningeal disease. Low-density in the right temporal lobe where there is enhancing  lesion by prior brain MRI and interval surgery. Stable from head Vascular: No hyperdense vessel or unexpected calcification. Skull: Normal. Negative for fracture or focal lesion. Sinuses/Orbits: No acute finding. IMPRESSION: 1. Stable head CT since earlier today. 2. Similar degree of communicating hydrocephalus. 3. Postoperative right temporal lobe. Electronically Signed   By: Tiburcio Pea M.D.   On: 10/31/2022 21:06   CT HEAD WO CONTRAST  Result Date: 10/31/2022 CLINICAL DATA:  72 year old male with a history of rapidly progressive cognitive decline. Status post craniotomy, excision ule excision a biopsy postoperative day 1. Widespread recent abnormal leptomeningeal enhancement on MRI, enhancing right mesial temporal lobe lesion. EXAM: CT HEAD WITHOUT CONTRAST TECHNIQUE: Contiguous axial images were obtained from the base of the skull through the vertex without intravenous contrast. RADIATION DOSE REDUCTION: This exam was performed according to the departmental dose-optimization program which includes automated exposure control, adjustment of the mA and/or kV according to patient size and/or use of iterative reconstruction technique. COMPARISON:  Preoperative MRI 510 24. Preoperative head CT 10/29/2022. FINDINGS: Brain: Trace pneumocephalus  along the non dependent anterior frontal lobes. Stable cerebral volume. Small anterior right temporal lobe resection cavity with gas and increased hypodensity series 3, image 13. And the resection continues laterally through the right inferior or middle temporal gyrus. Stable ventricle size and configuration. Nonspecific periventricular hypodensity continues and is confluent. No convincing acute intracranial hemorrhage. No intracranial mass effect or midline shift. No cortically based acute infarct identified. Basilar cisterns remain patent. Vascular: No suspicious intracranial vascular hyperdensity. Skull: Right temporal craniotomy with bone fasteners in place. No  superimposed acute or suspicious osseous lesion. Sinuses/Orbits: Visualized paranasal sinuses and mastoids are stable and well aerated. Other: Postoperative changes to the right scalp with skin staples in place. Orbits soft tissues are stable, negative. IMPRESSION: 1. Satisfactory postoperative changes of right temporal craniotomy, small right anterior temporal lobe resection. Trace postoperative pneumocephalus. 2. Otherwise stable non contrast CT appearance of the brain. No new intracranial abnormality. Electronically Signed   By: Odessa Fleming M.D.   On: 10/31/2022 06:37   CT DBS HEAD W/O CONTRAST ( )  Result Date: 10/29/2022 CLINICAL DATA:  Brain lesion. EXAM: CT HEAD WITHOUT CONTRAST TECHNIQUE: Contiguous axial images were obtained from the base of the skull through the vertex without intravenous contrast. RADIATION DOSE REDUCTION: This exam was performed according to the departmental dose-optimization program which includes automated exposure control, adjustment of the mA and/or kV according to patient size and/or use of iterative reconstruction technique. COMPARISON:  MRI brain 10/26/2022.  Head CT 09/28/2022. FINDINGS: Brain: No acute intracranial hemorrhage. Unchanged severe chronic small-vessel disease. Unchanged disproportionate prominence of the ventricles relative to the sulci with acute callosal angle, suggestive of idiopathic normal pressure hydrocephalus. No extra-axial collection. No mass effect or midline shift. Vascular: No hyperdense vessel or unexpected calcification. Skull: No calvarial fracture or suspicious bone lesion. Skull base is unremarkable. Sinuses/Orbits: Unremarkable. Other: None. IMPRESSION: 1. No acute intracranial abnormality. 2. Unchanged severe chronic small-vessel disease. 3. Unchanged disproportionate prominence of the ventricles relative to the sulci with acute callosal angle, suggestive of idiopathic normal pressure hydrocephalus. Electronically Signed   By: Orvan Falconer  M.D.   On: 10/29/2022 21:37   DG Abd Portable 1V  Result Date: 10/27/2022 CLINICAL DATA:  Abdominal pain and tenderness, initial encounter EXAM: PORTABLE ABDOMEN - 1 VIEW COMPARISON:  10/21/2022 FINDINGS: Contrast is noted within the colon from prior CT examination. Stable nonobstructing right renal calculus is noted. The previously seen rectal fecal ball has passed in the interim from the prior exam. No free air is seen. No obstructive changes are noted. IMPRESSION: Previously seen fecal ball in the rectum has passed. Nonobstructing right renal stone stable from prior CT. Electronically Signed   By: Alcide Clever M.D.   On: 10/27/2022 18:48   MR BRAIN W WO CONTRAST  Result Date: 10/26/2022 CLINICAL DATA:  Brain/CNS neoplasm, assess treatment response STEREOTACTIC PROTOCOL FOR INTRAOPERATIVE USE. EXAM: MRI HEAD WITHOUT AND WITH CONTRAST TECHNIQUE: Multiplanar, multiecho pulse sequences of the brain and surrounding structures were obtained without and with intravenous contrast. CONTRAST:  10mL GADAVIST GADOBUTROL 1 MMOL/ML IV SOLN COMPARISON:  10/19/2022 FINDINGS: Brain: No acute infarct, mass effect or extra-axial collection. No acute or chronic hemorrhage. There is confluent hyperintense T2-weighted signal within the periventricular and deep white matter. The ventricles are markedly dilated with superior sulcal crowding. Ventricular sizes disc proportionate to parenchymal brain volume. Unchanged appearance of abnormal contrast enhancement at the anterior right temporal lobe and along the cerebral and brainstem surfaces, and along multiple cranial nerves. Vascular: Major  flow voids are preserved. Skull and upper cervical spine: Normal calvarium and skull base. Visualized upper cervical spine and soft tissues are normal. Sinuses/Orbits:No paranasal sinus fluid levels or advanced mucosal thickening. No mastoid or middle ear effusion. Normal orbits. IMPRESSION: 1. Unchanged appearance of abnormal contrast  enhancement at the anterior right temporal lobe, along the cerebral and brainstem surfaces, and along multiple cranial nerves. Again, this is concerning for leptomeningeal carcinomatosis or granulomatous disease such as neurosarcoidosis. 2. Unchanged size and configuration of the ventricles in a pattern that is compatible with normal pressure hydrocephalus. 3. Advanced chronic microangiopathic white matter changes. Stereotactic imaging for intraoperative use Electronically Signed   By: Deatra Robinson M.D.   On: 10/26/2022 19:49   CT CHEST ABDOMEN PELVIS W CONTRAST  Result Date: 10/21/2022 CLINICAL DATA:  Occult malignancy Malignancy workup. EXAM: CT CHEST, ABDOMEN, AND PELVIS WITH CONTRAST TECHNIQUE: Multidetector CT imaging of the chest, abdomen and pelvis was performed following the standard protocol during bolus administration of intravenous contrast. RADIATION DOSE REDUCTION: This exam was performed according to the departmental dose-optimization program which includes automated exposure control, adjustment of the mA and/or kV according to patient size and/or use of iterative reconstruction technique. CONTRAST:  75mL OMNIPAQUE IOHEXOL 350 MG/ML SOLN COMPARISON:  CT dated October 13, 2022 FINDINGS: CT CHEST FINDINGS Cardiovascular: Heart is the upper limits of normal in size. No pericardial effusion. Three-vessel coronary artery atherosclerotic calcifications. Scattered atherosclerosis of the nonaneurysmal thoracic aorta. Mediastinum/Nodes: No axillary, mediastinal or hilar adenopathy. No visualized supraclavicular adenopathy. Visualized thyroid is unremarkable. Lungs/Pleura: No pleural effusion or pneumothorax. Patchy RIGHT basilar predominately platelike airspace opacities. No suspicious pulmonary masses or nodules. Musculoskeletal: Bilateral gynecomastia.  Remote rib fractures. CT ABDOMEN PELVIS FINDINGS Hepatobiliary: No suspicious focal hepatic mass. Gallbladder is unremarkable. No intrahepatic or  extrahepatic biliary ductal dilation. Pancreas: Unremarkable. No pancreatic ductal dilatation or surrounding inflammatory changes. Spleen: Normal in size without focal abnormality. Adrenals/Urinary Tract: Adrenal glands are unremarkable. Kidneys enhance symmetrically. No suspicious focal renal mass. Nonobstructing RIGHT-sided nephrolithiasis measuring 9 mm. No hydronephrosis. No obstructing nephrolithiasis. Bladder is unremarkable. Stomach/Bowel: Large rectal stool ball. It measures 23 cm in craniocaudal extent. There is a small amount of fat stranding anterior to the sacrum with bowel wall prominence of the rectum. Moderate RIGHT-sided colonic stool burden. Appendix is normal. Stomach is unremarkable for degree decompression. Vascular/Lymphatic: No significant vascular findings are present. No enlarged abdominal or pelvic lymph nodes. Reproductive: Prostate is present. Other: Small volume free fluid anterior to the sacrum.  No free air. Musculoskeletal: No acute or significant osseous findings. IMPRESSION: 1. No evidence of primary malignancy within the chest, abdomen or pelvis. 2. Large rectal stool ball measuring at least 23 cm in craniocaudal extent. There is a small amount of fat stranding anterior to the sacrum with bowel wall prominence. Findings could reflect stercoral colitis. 3. Patchy RIGHT basilar predominately platelike airspace opacities could reflect atelectasis or infection. Aortic Atherosclerosis (ICD10-I70.0). Electronically Signed   By: Meda Klinefelter M.D.   On: 10/21/2022 15:45   EEG adult  Result Date: 10/19/2022 Charlsie Quest, MD     10/19/2022  5:07 PM Patient Name: Jeremy Medina. 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 EEG to evaluate for seizure Level of alertness:  lethargic AEDs during EEG study: None  Technical aspects: This EEG study was done with scalp electrodes positioned according to  the 10-20 International system of electrode placement. Electrical activity was reviewed with band pass filter of 1-70Hz , sensitivity of 7 uV/mm, display speed of 7mm/sec with a 60Hz  notched filter applied as appropriate. EEG data were recorded continuously and digitally stored.  Video monitoring was available and reviewed as appropriate. Description: No clear posterior dominant rhythm was seen. EEG showed continuous generalized 3 to 6 Hz theta-delta slowing. Generalized periodic discharges with triphasic morphology at 1 Hz were noted, more prominent when awake/stimulated.  Hyperventilation and photic stimulation were not performed.    ABNORMALITY - Periodic discharges with triphasic morphology, generalized - Continuous slow, generalized  IMPRESSION: This study is suggestive of moderate diffuse encephalopathy, nonspecific etiology. Generalized periodic discharges with triphasic morphology can be seen due to CJD but is not specific. No seizures or definite epileptiform discharges were seen throughout the recording.  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 EXAM: MRI HEAD WITHOUT AND WITH CONTRAST TECHNIQUE: Multiplanar, multiecho pulse sequences of the brain and surrounding structures were obtained without and with intravenous contrast. CONTRAST:  10mL GADAVIST GADOBUTROL 1 MMOL/ML IV SOLN COMPARISON:  MRI of the brain September 14, 2022. Head CT September 28, 2022. FINDINGS: Brain: Diffuse leptomeningeal enhancement along the cerebral, cerebellar and brainstem surfaces as well as along multiple cranial nerves. There are also multifocal areas of leptomeningeal nodular enhancement, more evident in a inferior left cerebellar sulcus, measuring approximately 6 mm, and in the right collateral sulcus,  measuring approximately 10 mm. There is no restricted diffusion within the lesion to suggest an abscess. There is no cortical or deep gray nuclei restricted diffusion to suggest Creutzfeldt-Jakob disease. Confluent T2 hyperintensity within the white matter of the cerebral hemispheres, nonspecific. Supratentorial ventriculomegaly, mildly progressed since most recent CT and significantly progressed since CT performed in August 02, 2022. Effacement of the high convexity sulci. No evidence of an acute infarct or hemorrhage. Vascular: Normal flow voids. Skull and upper cervical spine: Normal marrow signal. Sinuses/Orbits: Negative. Other: None. IMPRESSION: 1. Diffuse leptomeningeal enhancement along the cerebral, cerebellar and brainstem surfaces as well as along multiple cranial nerves, as detailed above. 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 2. Supratentorial ventriculomegaly, mildly progressed since most recent CT and significantly progressed since CT performed in August 02, 2022. Effacement of the high convexity sulci. 3. Confluent T2 hyperintensity within the white matter of the cerebral hemispheres, most likely related to chronic microangiopathy. Electronically Signed   By: Baldemar Lenis M.D.   On: 10/19/2022 15:18   CT CHEST ABDOMEN PELVIS WO CONTRAST  Result Date: 10/13/2022 CLINICAL DATA:  Leukocytosis. EXAM: CT CHEST, ABDOMEN AND PELVIS WITHOUT CONTRAST TECHNIQUE: Multidetector CT imaging of the chest, abdomen and pelvis was performed following the standard protocol without IV contrast. RADIATION DOSE REDUCTION: This exam was performed according to the departmental dose-optimization program which includes automated exposure control, adjustment of the mA and/or kV according to patient size and/or use of iterative reconstruction technique. COMPARISON:  CT abdomen and pelvis 08/02/2022. Report only CT of the chest 08/26/2015. FINDINGS: CT  CHEST FINDINGS Cardiovascular: No significant vascular findings. Normal heart size. No pericardial effusion. Mediastinum/Nodes: No enlarged mediastinal, hilar, or axillary lymph nodes. Thyroid gland, trachea, and esophagus demonstrate no significant findings. Lungs/Pleura: There are minimal patchy ground-glass and airspace opacities in the bilateral lower lobes, right greater than left. There is no pleural effusion or pneumothorax.  Musculoskeletal: There are healed left fourth through eleventh rib fractures. No acute fractures are seen. CT ABDOMEN PELVIS FINDINGS Hepatobiliary: No focal liver abnormality is seen. No gallstones, gallbladder wall thickening, or biliary dilatation. Pancreas: Unremarkable. No pancreatic ductal dilatation or surrounding inflammatory changes. Spleen: Normal in size without focal abnormality. Adrenals/Urinary Tract: There is an 8 mm calculus in the right kidney, unchanged. Otherwise, the kidneys, adrenal glands and bladder are within normal limits. Stomach/Bowel: There is mild rectal wall thickening with some presacral edema. There is no bowel obstruction, pneumatosis or free air. The appendix, small bowel and stomach are within normal limits. Vascular/Lymphatic: Aortic atherosclerosis. No enlarged abdominal or pelvic lymph nodes. Reproductive: Prostate is unremarkable. Other: No abdominal wall hernia or abnormality. No abdominopelvic ascites. Musculoskeletal: No acute or significant osseous findings. IMPRESSION: 1. Minimal patchy ground-glass and airspace opacities in the bilateral lower lobes, right greater than left, worrisome for infection. 2. Rectal wall thickening with presacral edema worrisome for proctitis. 3. Nonobstructing right renal calculus. Aortic Atherosclerosis (ICD10-I70.0). Electronically Signed   By: Darliss Cheney M.D.   On: 10/13/2022 21:36   Overnight EEG with video  Result Date: 10/13/2022 Charlsie Quest, MD     10/14/2022  8:28 AM Patient Name: Jeremy Medina. MRN: [id number] Epilepsy Attending: Charlsie Quest Referring Physician/Provider: Erick Blinks, MD Duration: 10/12/2022 1553 to 10/13/2022 1553 Patient history: 72yo M with ams getting eeg to evaluate for seizure. Level of alertness: Awake, asleep AEDs during EEG study: Ativan Technical aspects: This EEG study was done with scalp electrodes positioned according to the 10-20 International system of electrode placement. Electrical activity was reviewed with band pass filter of 1-70Hz , sensitivity of 7 uV/mm, display speed of 90mm/sec with a 60Hz  notched filter applied as appropriate. EEG data were recorded continuously and digitally stored.  Video monitoring was available and reviewed as appropriate. Description: No clear posterior dominant rhythm was seen. Sleep was characterized by vertex waves, sleep spindles (12 to 14 Hz), maximal frontocentral region. EEG showed continuous generalized 3 to 6 Hz theta-delta slowing. Generalized periodic discharges with triphasic morphology at 1 Hz were noted, more prominent when awake/stimulated.   Hyperventilation and photic stimulation were not performed.   ABNORMALITY - Periodic discharges with triphasic morphology, generalized - Continuous slow, generalized IMPRESSION: This study is suggestive of moderate diffuse encephalopathy, nonspecific etiology but likely related to toxic-metabolic causes. No seizures or definite epileptiform discharges were seen throughout the recording. Charlsie Quest    Microbiology: No results found for this or any previous visit (from the past 240 hour(s)).   Labs: Basic Metabolic Panel: Recent Labs  Lab 11/05/22 0837 11/06/22 0412  NA 141 139  K 3.4* 3.5  CL 106 102  CO2 24 27  GLUCOSE 209* 415*  BUN 17 16  CREATININE 0.81 0.89  CALCIUM 8.9 8.7*  MG 1.8 1.8   Liver Function Tests: Recent Labs  Lab 11/05/22 0837 11/06/22 0412  AST 16 17  ALT 28 27  ALKPHOS 84 87  BILITOT 0.4 0.3  PROT 5.7* 5.5*   ALBUMIN 2.6* 2.5*   No results for input(s): "LIPASE", "AMYLASE" in the last 168 hours. No results for input(s): "AMMONIA" in the last 168 hours. CBC: Recent Labs  Lab 11/05/22 0837 11/06/22 0412  WBC 9.9 8.6  HGB 9.9* 9.4*  HCT 31.2* 28.6*  MCV 90.2 90.5  PLT 240 252   Cardiac Enzymes: No results for input(s): "CKTOTAL", "CKMB", "CKMBINDEX", "TROPONINI" in the last 168 hours. BNP: BNP (last  3 results) No results for input(s): "BNP" in the last 8760 hours.  ProBNP (last 3 results) No results for input(s): "PROBNP" in the last 8760 hours.  CBG: Recent Labs  Lab 11/10/22 1618 11/10/22 2213 11/10/22 2241 11/11/22 0456 11/11/22 1103  GLUCAP 137* 69* 129* 140* 178*       Signed:  Ramiro Harvest MD.  [facility] 11/11/2022, 1:35 PM

## 2022-11-12 LAB — MISC LABCORP TEST (SEND OUT): Labcorp test code: 81695

## 2022-11-13 ENCOUNTER — Encounter: Payer: Self-pay | Admitting: Surgery

## 2022-11-14 ENCOUNTER — Encounter (HOSPITAL_COMMUNITY): Payer: Self-pay

## 2022-11-14 MED FILL — Medication: Qty: 2 | Status: AC

## 2022-11-14 MED FILL — Medication: Qty: 1 | Status: AC

## 2022-11-17 DEATH — deceased

## 2022-11-29 LAB — MTB-RIF NAA W AFB CULT, NON-SPUTUM: Acid Fast Culture: NEGATIVE

## 2022-11-29 LAB — FUNGUS CULTURE RESULT

## 2022-11-29 LAB — FUNGUS CULTURE WITH STAIN

## 2022-11-29 LAB — FUNGAL ORGANISM REFLEX

## 2022-12-13 LAB — MISC LABCORP TEST (SEND OUT): Labcorp test code: 9985

## 2022-12-17 LAB — ACID FAST CULTURE WITH REFLEXED SENSITIVITIES (MYCOBACTERIA): Acid Fast Culture: NEGATIVE
